# Patient Record
Sex: Female | Born: 2000 | Race: White | Hispanic: No | Marital: Single | Smoking: Never smoker
Health system: Southern US, Community
[De-identification: ages and names within clinical notes are randomized; demographics above are authoritative.]

## PROBLEM LIST (undated history)

## (undated) DIAGNOSIS — E669 Obesity, unspecified: Secondary | ICD-10-CM

## (undated) DIAGNOSIS — F913 Oppositional defiant disorder: Secondary | ICD-10-CM

## (undated) DIAGNOSIS — D649 Anemia, unspecified: Secondary | ICD-10-CM

## (undated) DIAGNOSIS — K5900 Constipation, unspecified: Secondary | ICD-10-CM

## (undated) DIAGNOSIS — J189 Pneumonia, unspecified organism: Secondary | ICD-10-CM

## (undated) DIAGNOSIS — F840 Autistic disorder: Secondary | ICD-10-CM

## (undated) DIAGNOSIS — E282 Polycystic ovarian syndrome: Secondary | ICD-10-CM

## (undated) DIAGNOSIS — E611 Iron deficiency: Secondary | ICD-10-CM

## (undated) DIAGNOSIS — K219 Gastro-esophageal reflux disease without esophagitis: Secondary | ICD-10-CM

## (undated) DIAGNOSIS — E88819 Insulin resistance, unspecified: Secondary | ICD-10-CM

## (undated) DIAGNOSIS — F819 Developmental disorder of scholastic skills, unspecified: Secondary | ICD-10-CM

## (undated) DIAGNOSIS — F32A Depression, unspecified: Secondary | ICD-10-CM

## (undated) DIAGNOSIS — F329 Major depressive disorder, single episode, unspecified: Secondary | ICD-10-CM

## (undated) DIAGNOSIS — F3281 Premenstrual dysphoric disorder: Secondary | ICD-10-CM

## (undated) DIAGNOSIS — R6250 Unspecified lack of expected normal physiological development in childhood: Secondary | ICD-10-CM

## (undated) DIAGNOSIS — F909 Attention-deficit hyperactivity disorder, unspecified type: Secondary | ICD-10-CM

## (undated) DIAGNOSIS — H547 Unspecified visual loss: Secondary | ICD-10-CM

## (undated) DIAGNOSIS — R519 Headache, unspecified: Secondary | ICD-10-CM

## (undated) DIAGNOSIS — E8881 Metabolic syndrome: Secondary | ICD-10-CM

## (undated) DIAGNOSIS — F419 Anxiety disorder, unspecified: Secondary | ICD-10-CM

## (undated) HISTORY — DX: Iron deficiency: E61.1

## (undated) HISTORY — DX: Constipation, unspecified: K59.00

## (undated) HISTORY — DX: Autistic disorder: F84.0

## (undated) HISTORY — DX: Unspecified visual loss: H54.7

## (undated) HISTORY — DX: Obesity, unspecified: E66.9

## (undated) HISTORY — DX: Gastro-esophageal reflux disease without esophagitis: K21.9

---
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
## 2021-01-01 ENCOUNTER — Encounter: Payer: Self-pay | Admitting: Registered"

## 2021-01-17 ENCOUNTER — Other Ambulatory Visit (INDEPENDENT_AMBULATORY_CARE_PROVIDER_SITE_OTHER)

## 2021-01-17 ENCOUNTER — Ambulatory Visit (INDEPENDENT_AMBULATORY_CARE_PROVIDER_SITE_OTHER): Admitting: Gastroenterology

## 2021-01-17 ENCOUNTER — Encounter: Payer: Self-pay | Admitting: Gastroenterology

## 2021-01-17 ENCOUNTER — Encounter: Payer: Self-pay | Admitting: Physician Assistant

## 2021-01-17 VITALS — BP 100/64 | HR 96 | Ht 63.78 in | Wt 291.1 lb

## 2021-01-17 DIAGNOSIS — K219 Gastro-esophageal reflux disease without esophagitis: Secondary | ICD-10-CM | POA: Diagnosis not present

## 2021-01-17 DIAGNOSIS — E611 Iron deficiency: Secondary | ICD-10-CM

## 2021-01-17 DIAGNOSIS — R1084 Generalized abdominal pain: Secondary | ICD-10-CM

## 2021-01-17 LAB — CBC
HCT: 39.5 % (ref 36.0–49.0)
Hemoglobin: 13.5 g/dL (ref 12.0–16.0)
MCHC: 34.1 g/dL (ref 31.0–37.0)
MCV: 85.3 fl (ref 78.0–98.0)
Platelets: 380 10*3/uL (ref 150.0–575.0)
RBC: 4.63 Mil/uL (ref 3.80–5.70)
RDW: 13.1 % (ref 11.4–15.5)
WBC: 8.3 10*3/uL (ref 4.5–13.5)

## 2021-01-17 LAB — FERRITIN: Ferritin: 244.2 ng/mL (ref 10.0–291.0)

## 2021-01-17 NOTE — Patient Instructions (Signed)
Your provider has requested that you go to the basement level for lab work before leaving today. Press "B" on the elevator. The lab is located at the first door on the left as you exit the elevator.  You have been scheduled for an endoscopy and colonoscopy. Please follow the written instructions given to you at your visit today. Please pick up your prep supplies at the pharmacy within the next 1-3 days. If you use inhalers (even only as needed), please bring them with you on the day of your procedure.  If you are age 30 or older, your body mass index should be between 23-30. Your Body mass index is 50.32 kg/m. If this is out of the aforementioned range listed, please consider follow up with your Primary Care Provider.  If you are age 22 or younger, your body mass index should be between 19-25. Your Body mass index is 50.32 kg/m. If this is out of the aformentioned range listed, please consider follow up with your Primary Care Provider.   ________________________________________________________  The [facility] providers would like to encourage you to use [facility] to communicate with providers for non-urgent requests or questions.  Due to long hold times on the telephone, sending your provider a message by [facility] may be a faster and more efficient way to get a response.  Please allow 48 business hours for a response.  Please remember that this is for non-urgent requests.  _______________________________________________________

## 2021-01-17 NOTE — Progress Notes (Signed)
01/17/2021 [name] [id number] [birth year]   HISTORY OF PRESENT ILLNESS: This is a 21 year old female who is new to our office.  She is here today with her mom.  She is actually been referred here by Dede Query, PA-C, from hematology/oncology for evaluation of right lower quadrant abdominal pain.  She reports abdominal pain, mostly on the right side of her abdomen.  She has had bouts at least on and off with this abdominal pain dating back to April 2019 and has had intermittent CT scans over the years that show mesenteric adenopathy/mesenteric adenitis.  They are actually saying more so right lower quadrant mesenteric adenopathy MAYBE representing mesenteric adenitis.  No specific inflammatory changes of the bowel, but question if this could be more of an IBD type picture, but sed rate and CRP are normal as well.  She had a CT scan in April 2019, March 2022, and again in November 2022 and in December 2022 that have all shown similar findings.  No diarrhea, previously had some constipation but that has improved since taking stool softeners.  No rectal bleeding.  Other basic labs also fairly unremarkable.  Was having some issues with some iron deficiency and is on ferrous sulfate daily, which is why she follows with hematology/oncology.  She does report intermittent acid reflux despite taking omeprazole 40 mg daily.  She gets a lot of nausea as well and uses Zofran as needed.  She admits to taking naproxen regularly.  She has lost about 30 pounds since September since starting [facility] for her PCOS.  Of note, the patient is a poor or at least limited historian due to her autism, developmental delay, etc.  They did report that she saw a pediatric gastroenterologist through [facility] back in 2021 and had an EGD in May 2021.  That showed normal esophagus with erythematous mucosa in the gastric body and gastric antrum.  Duodenum was normal.  Gastric biopsy showed gastric mucosa with changes  suggestive of reactive gastropathy and mild active gastritis.  Duodenal biopsies were normal.  Esophageal biopsy showed mild acute esophagitis and mild increase in intraepithelial eosinophils up to 3 in a 40 times power field suggestive of reflux esophagitis.  No Barrett's noted.   Past Medical History:  Diagnosis Date   ADHD (attention deficit hyperactivity disorder)    Anxiety    Autism spectrum    Constipated    Constipation    Depression    Development delay    GERD (gastroesophageal reflux disease)    Insulin resistance    Iron deficiency    Learning disability    Obesity    ODD (oppositional defiant disorder)    PCOS (polycystic ovarian syndrome)    PMDD (premenstrual dysphoric disorder)    Pneumonia    3 mos old and 81 mos old   Visual acuity reduced    glasses   Past Surgical History:  Procedure Laterality Date   TOOTH EXTRACTION N/A 01/03/2018   Procedure: SURGICAL EXTRACTION OF TEETH #1, 16, 17, 32;  Surgeon: Michael Litter, DMD;  Location: [facility];  Service: Oral Surgery;  Laterality: N/A;    reports that she has never smoked. She has never used smokeless tobacco. She reports that she does not drink alcohol. No history on file for drug use. family history includes ADD / ADHD in her sister; Anxiety disorder in her father, mother, and sister; Atrial fibrillation in her maternal grandmother; Breast cancer in her maternal aunt; Colon polyps in her mother; Depression  in her father; Diabetes in her father, maternal grandmother, and mother; Heart failure in her maternal grandmother; Hypertension in her maternal grandfather and mother; Iron deficiency in her maternal aunt; Irritable bowel syndrome in her mother; Obesity in her father and mother; Other in her mother and sister; Stroke in her mother. No Known Allergies    Outpatient Encounter Medications as of 01/17/2021  Medication Sig   Amphetamine Sulfate (EVEKEO) 10 MG TABS Take 10 mg by mouth as directed. Take 1-2 tablets by mouth  as directed. Take 2 tabs in AM, and 2 tabs at lunch. May take 1 tab at 4 PM PRN   blood glucose meter kit and supplies KIT Dispense based on patient and insurance preference. Use daily as directed.   busPIRone (BUSPAR) 10 MG tablet TAKE 2 TABLETS BY MOUTH THREE TIMES DAILY   cloNIDine HCl (KAPVAY) 0.1 MG TB12 ER tablet Take 2 tablets by mouth 2 times daily   ferrous sulfate 325 (65 FE) MG tablet Take 325 mg by mouth daily with breakfast.   FLUoxetine (PROZAC) 40 MG capsule TAKE 1 CAPSULE(40 MG) BY MOUTH DAILY with 10 mg capsule   MOUNJARO 7.5 MG/0.5ML Pen Inject 7.5 mg into the skin once a week.   naproxen (NAPROSYN) 500 MG tablet Take 1 tablet (500 mg total) by mouth 2 (two) times daily.   omeprazole (PRILOSEC) 40 MG capsule Take 1 capsule (40 mg total) by mouth daily.   ondansetron (ZOFRAN ODT) 4 MG disintegrating tablet Take 1 tablet (4 mg total) by mouth every 8 (eight) hours as needed for nausea or vomiting.   Topiramate ER (TROKENDI XR) 50 MG CP24 Take 1 capsule by mouth daily.   VIORELE 0.15-0.02/0.01 MG (21/5) tablet TAKE 1 TABLET BY MOUTH DAILY   vitamin C (ASCORBIC ACID) 500 MG tablet Take 500 mg by mouth daily.   [DISCONTINUED] tirzepatide [facility]) 5 MG/0.5ML Pen Inject 5 mg into the skin once a week. (Patient taking differently: Inject 7.5 mg into the skin once a week.)   [DISCONTINUED] predniSONE (DELTASONE) 10 MG tablet Take 6 tablets (60 mg total) by mouth daily with breakfast for 7 days, THEN 5 tablets (50 mg total) daily with breakfast for 7 days, THEN 4 tablets (40 mg total) daily with breakfast for 7 days, THEN 3 tablets (30 mg total) daily with breakfast for 7 days, THEN 2 tablets (20 mg total) daily with breakfast for 7 days, THEN 1 tablet (10 mg total) daily with breakfast for 7 days.   No facility-administered encounter medications on file as of 01/17/2021.     REVIEW OF SYSTEMS  : All other systems reviewed and negative except where noted in the History of Present  Illness.   PHYSICAL EXAM: BP 100/64 (BP Location: Left Arm, Patient Position: Sitting, Cuff Size: Large)    Pulse 96    Ht 5' 3.78" (1.62 m) Comment: height measured without shoes   Wt 291 lb 2 oz (132.1 kg)    LMP 09/25/2020 Comment: PCOS   BMI 50.32 kg/m  General: Well developed white female in no acute distress Head: Normocephalic and atraumatic Eyes:  Sclerae anicteric, conjunctiva pink. Ears: Normal auditory acuity Lungs: Clear throughout to auscultation; no W/R/R. Heart: Regular rate and rhythm; no M/R/G. Abdomen: Soft, obese, non-distended.  BS present.  Minimal TTP diffusely. Rectal: Will be done at the time of colonoscopy. Musculoskeletal: Symmetrical with no gross deformities  Skin: No lesions on visible extremities Extremities: No edema  Neurological: Alert oriented x 4, grossly non-focal  Psychological:  Alert and cooperative. Normal mood and affect  ASSESSMENT AND PLAN: *21 year old female with complaints of abdominal pain, mostly on the right side of her abdomen.  She has had bouts at least on and off with this abdominal pain dating back to April 2019 and has had intermittent CT scans over the years that show mesenteric adenopathy/mesenteric adenitis.  They are actually saying more so right lower quadrant mesenteric adenopathy MAYBE representing mesenteric adenitis.  No specific inflammatory changes of the bowel, but question if this could be more of an IBD type picture, but sed rate and CRP are normal as well.  No diarrhea, previously had some constipation but that has improved.  No rectal bleeding. *GERD: On omeprazole 40 mg daily, but still reports intermittent issues with this and also some nausea.  Does take NSAIDs regularly.  Rule out ulcer, etc.  -We will plan for EGD and colonoscopy at [facility] due to the patient's BMI greater than 50.  These are being scheduled with Dr. Henrene Pastor due to him having the next availability.  The risks, benefits, and alternatives to  EGD and colonoscopy were discussed with the patient and she consents to proceed.  -Will check celiac labs.  CC:  Lincoln Brigham, PA-C

## 2021-01-18 ENCOUNTER — Encounter: Payer: Self-pay | Admitting: Physician Assistant

## 2021-01-18 LAB — IRON AND TIBC
Iron Saturation: 13 % — ABNORMAL LOW (ref 15–55)
Iron: 39 ug/dL (ref 27–159)
Total Iron Binding Capacity: 292 ug/dL (ref 250–450)
UIBC: 253 ug/dL (ref 131–425)

## 2021-01-21 LAB — IGA: Immunoglobulin A: 133 mg/dL (ref 47–310)

## 2021-01-21 LAB — TISSUE TRANSGLUTAMINASE ABS,IGG,IGA
(tTG) Ab, IgA: <1 U/mL
(tTG) Ab, IgG: <1 U/mL

## 2021-01-23 ENCOUNTER — Encounter: Payer: Self-pay | Admitting: Gastroenterology

## 2021-01-28 ENCOUNTER — Encounter: Payer: Self-pay | Admitting: Family Medicine

## 2021-01-28 NOTE — Progress Notes (Signed)
Noted  

## 2021-01-29 ENCOUNTER — Other Ambulatory Visit: Payer: Self-pay | Admitting: Family

## 2021-01-30 ENCOUNTER — Encounter (INDEPENDENT_AMBULATORY_CARE_PROVIDER_SITE_OTHER): Payer: Self-pay | Admitting: Family Medicine

## 2021-01-30 ENCOUNTER — Other Ambulatory Visit: Payer: Self-pay

## 2021-01-30 ENCOUNTER — Ambulatory Visit (INDEPENDENT_AMBULATORY_CARE_PROVIDER_SITE_OTHER): Admitting: Family Medicine

## 2021-01-30 VITALS — BP 89/57 | HR 90 | Temp 98.2°F | Ht 64.0 in | Wt 286.0 lb

## 2021-01-30 DIAGNOSIS — R7301 Impaired fasting glucose: Secondary | ICD-10-CM | POA: Diagnosis not present

## 2021-01-30 DIAGNOSIS — F902 Attention-deficit hyperactivity disorder, combined type: Secondary | ICD-10-CM

## 2021-01-30 DIAGNOSIS — K219 Gastro-esophageal reflux disease without esophagitis: Secondary | ICD-10-CM | POA: Diagnosis not present

## 2021-01-30 DIAGNOSIS — G43809 Other migraine, not intractable, without status migrainosus: Secondary | ICD-10-CM

## 2021-01-30 DIAGNOSIS — Z6841 Body Mass Index (BMI) 40.0 and over, adult: Secondary | ICD-10-CM

## 2021-01-30 DIAGNOSIS — E669 Obesity, unspecified: Secondary | ICD-10-CM

## 2021-01-30 DIAGNOSIS — F419 Anxiety disorder, unspecified: Secondary | ICD-10-CM

## 2021-01-30 DIAGNOSIS — E66813 Obesity, class 3: Secondary | ICD-10-CM

## 2021-01-30 DIAGNOSIS — F32A Depression, unspecified: Secondary | ICD-10-CM

## 2021-01-30 MED ORDER — AMPHETAMINE SULFATE 10 MG PO TABS: 10.0000 mg | ORAL_TABLET | ORAL | 0 refills | Status: DC

## 2021-01-30 MED ORDER — TIRZEPATIDE 5 MG/0.5ML ~~LOC~~ SOAJ: 5.0000 mg | SUBCUTANEOUS | 3 refills | Status: DC

## 2021-01-30 MED ORDER — OMEPRAZOLE 40 MG PO CPDR: 40.0000 mg | DELAYED_RELEASE_CAPSULE | Freq: Every day | ORAL | 2 refills | Status: DC

## 2021-01-30 MED ORDER — DICYCLOMINE HCL 20 MG PO TABS: 20.0000 mg | ORAL_TABLET | Freq: Four times a day (QID) | ORAL | 0 refills | Status: DC

## 2021-01-30 MED ORDER — BUSPIRONE HCL 10 MG PO TABS: ORAL_TABLET | ORAL | 2 refills | Status: DC

## 2021-01-30 MED ORDER — TOPIRAMATE ER 50 MG PO CAP24: 1.0000 | ORAL_CAPSULE | Freq: Every day | ORAL | 1 refills | Status: DC

## 2021-01-30 MED ORDER — CLONIDINE HCL ER 0.1 MG PO TB12: ORAL_TABLET | ORAL | 0 refills | Status: DC

## 2021-01-30 NOTE — Telephone Encounter (Signed)
Patient is no longer under our care due to insurance

## 2021-02-03 ENCOUNTER — Encounter (INDEPENDENT_AMBULATORY_CARE_PROVIDER_SITE_OTHER): Payer: Self-pay | Admitting: Family Medicine

## 2021-02-03 ENCOUNTER — Encounter: Payer: Self-pay | Admitting: Family Medicine

## 2021-02-05 NOTE — Progress Notes (Signed)
Chief Complaint:   OBESITY Sarah Shah is here to discuss her progress with her obesity treatment plan along with follow-up of her obesity related diagnoses. See Medical Weight Management Flowsheet for complete bioelectrical impedance results.  Today's visit was #: 28 Starting weight: 320 lbs Starting date: 03/01/2019 Weight change since last visit: +6 lbs Total lbs lost to date: 34 lbs Total weight loss percentage to date: -10.63%  Nutrition Plan: Practicing portion control and making smarter food choices, such as increasing vegetables and decreasing simple carbohydrates for 75% of the time. Activity: Increased activity/walking. Anti-obesity medications: Mounjaro 7.5 mg subcutaneously weekly. Reported side effects: None.  Interim History: Sarah Shah says she has had increased constipation since being off metformin at the beginning of December.  LMP was last week.  She reports she is still having RUQ discomfort.  She says she broke up with her boyfriend.  Sarah Shah's PCP is unavailable. Courtesy refills today so that she does not run out of important medications.   Assessment/Plan:   1. Impaired fasting glucose, with polyphagia Controlled. Current treatment: Mounjaro 7.5 mg subcutaneously weekly.    Plan:  Decrease Mounjaro to 5 mg subcutaneously weekly.  She will continue to focus on protein-rich, low simple carbohydrate foods. We reviewed the importance of hydration, regular exercise for stress reduction, and restorative sleep.  - Decrease tirzepatide (MOUNJARO) 5 MG/0.5ML Pen; Inject 5 mg into the skin once a week.  Dispense: 6 mL; Refill: 3  2. Gastroesophageal reflux disease, unspecified whether esophagitis present She is taking Prilosec 40 mg daily.  Plan:  Continue Prilosec 40 mg daily and start Bentyl 20 mg every 6 hours.  We reviewed the diagnosis of GERD and importance of treatment. We discussed "red flag" symptoms and the importance of follow up if symptoms persisted despite  treatment. We reviewed non-pharmacologic management of GERD symptoms: including: caffeine reduction, dietary changes, elevate HOB, NPO after supper, reduction of alcohol intake, tobacco cessation, and weight loss.  - Refill omeprazole (PRILOSEC) 40 MG capsule; Take 1 capsule (40 mg total) by mouth daily.  Dispense: 90 capsule; Refill: 2 - Start dicyclomine (BENTYL) 20 MG tablet; Take 1 tablet (20 mg total) by mouth every 6 (six) hours.  Dispense: 60 tablet; Refill: 0  3. Other migraine without status migrainosus, not intractable Sarah Shah is taking topiramate 50 mg nightly for migraine prevention.  Plan:  Continue topiramate 50 mg.  Will refill today.  - Refill Topiramate ER (TROKENDI XR) 50 MG CP24; Take 1 capsule by mouth daily.  Dispense: 30 capsule; Refill: 1  4. ADHD (attention deficit hyperactivity disorder), combined type Not at goal. Medication:  clonidine 0.1 mg 2 tablets twice daily.  Counseling ADHD is the most missed diagnosis in relation to food and appetite problems. Often the strong urge to binge or to self-medicate with food subsides once the impulsivity and inattention of ADHD are treated. A person can experience a new ability to tune in to the body's signals, control cravings and improve impulse control.  A deficiency in norepinephrine and dopamine can lead to the following behaviors related to eating: Poor awareness of internal cues of hunger and satiety, or fullness. Inability to follow a meal plan. Inability to judge portion size accurately. Inability to stop bingeing or purging. Distraction by continual thoughts of food, weight and body shape. Increased desire to overeat, especially high calorie, "reward" type foods. Poor self-esteem due to repeated failures of self-control.  Plan: Start Evekeo 10 mg 2 tablets in the morning, 2 tablets at  lunch, and 1 tablet at 4 pm if needed.  Continue clonidine as prescribed.  - Start Amphetamine Sulfate (EVEKEO) 10 MG TABS; Take 10  mg by mouth as directed. Take 1-2 tablets by mouth as directed. Take 2 tabs in AM, and 2 tabs at lunch. May take 1 tab at 4 PM PRN  Dispense: 150 tablet; Refill: 0 - Refill cloNIDine HCl (KAPVAY) 0.1 MG TB12 ER tablet; Take 2 tablets by mouth 2 times daily  Dispense: 360 tablet; Refill: 0  5. Anxiety and depression Sarah Shah is taking Buspar 10 mg 2 tablets three times daily for anxiety.  Plan:  Continue Buspar as prescribed.  Will refill today, as per below.  - Refill busPIRone (BUSPAR) 10 MG tablet; TAKE 2 TABLETS BY MOUTH THREE TIMES DAILY  Dispense: 180 tablet; Refill: 2  6. Obesity, current BMI 49.1  Course: Sarah Shah is currently in the action stage of change. As such, her goal is to continue with weight loss efforts.   Nutrition goals: She has agreed to practicing portion control and making smarter food choices, such as increasing vegetables and decreasing simple carbohydrates.   Exercise goals:  As is.  Behavioral modification strategies: increasing lean protein intake, decreasing simple carbohydrates, increasing vegetables, and increasing water intake.  Sarah Shah has agreed to follow-up with our clinic in 4 weeks. She was informed of the importance of frequent follow-up visits to maximize her success with intensive lifestyle modifications for her multiple health conditions.   Objective:   Blood pressure (!) 89/57, pulse 90, temperature 98.2 F (36.8 C), temperature source Oral, height 5\' 4"  (1.626 m), weight 286 lb (129.7 kg), SpO2 98 %. Body mass index is 49.09 kg/m.  General: Cooperative, alert, well developed, in no acute distress. HEENT: Conjunctivae and lids unremarkable. Cardiovascular: Regular rhythm.  Lungs: Normal work of breathing. Neurologic: No focal deficits.   Lab Results  Component Value Date   CREATININE 0.52 12/29/2020   BUN 7 12/29/2020   NA 138 12/29/2020   K 3.2 (L) 12/29/2020   CL 104 12/29/2020   CO2 26 12/29/2020   Lab Results  Component Value Date    ALT 16 12/29/2020   AST 9 (L) 12/29/2020   ALKPHOS 63 12/29/2020   BILITOT 0.2 (L) 12/29/2020   Lab Results  Component Value Date   HGBA1C 4.7 11/28/2020   HGBA1C 5.1 07/25/2020   HGBA1C 5.5 03/25/2020   HGBA1C 5.2 03/12/2020   HGBA1C 5.4 12/12/2019   Lab Results  Component Value Date   INSULIN 86.6 (H) 06/13/2020   INSULIN 42.6 (H) 03/25/2020   INSULIN 55.0 (H) 07/18/2019   INSULIN 35.1 (H) 03/01/2019   Lab Results  Component Value Date   TSH 2.470 03/01/2019   Lab Results  Component Value Date   CHOL 164 07/18/2019   HDL 52 07/18/2019   LDLCALC 86 07/18/2019   TRIG 150 (H) 07/18/2019   Lab Results  Component Value Date   VD25OH 46.3 03/25/2020   VD25OH 39.9 07/18/2019   VD25OH 22.0 (L) 03/01/2019   Lab Results  Component Value Date   WBC 8.3 01/17/2021   HGB 13.5 01/17/2021   HCT 39.5 01/17/2021   MCV 85.3 01/17/2021   PLT 380.0 01/17/2021   Lab Results  Component Value Date   IRON 39 01/17/2021   TIBC 292 01/17/2021   FERRITIN 244.2 01/17/2021   Attestation Statements:   Reviewed by clinician on day of visit: allergies, medications, problem list, medical history, surgical history, family history, social history, and  previous encounter notes.  I, Insurance claims handler, CMA, am acting as Energy manager for William Hamburger, NP.  I have reviewed the above documentation for accuracy and completeness, and I agree with the above. -  Helane Rima, DO, MS, FAAFP, DABOM - Family and Bariatric Medicine.

## 2021-02-06 ENCOUNTER — Encounter (HOSPITAL_COMMUNITY): Payer: Self-pay | Admitting: Internal Medicine

## 2021-02-17 ENCOUNTER — Ambulatory Visit (HOSPITAL_COMMUNITY)
Admission: RE | Admit: 2021-02-17 | Discharge: 2021-02-17 | Disposition: A | Discharge status: Home / Self Care | Attending: Internal Medicine | Admitting: Internal Medicine

## 2021-02-17 ENCOUNTER — Ambulatory Visit (HOSPITAL_COMMUNITY): Admitting: Anesthesiology

## 2021-02-17 ENCOUNTER — Encounter (HOSPITAL_COMMUNITY): Payer: Self-pay | Admitting: Internal Medicine

## 2021-02-17 ENCOUNTER — Encounter (HOSPITAL_COMMUNITY)
Admission: RE | Disposition: A | Discharge status: Home / Self Care | Payer: Self-pay | Source: Home / Self Care | Attending: Internal Medicine

## 2021-02-17 ENCOUNTER — Other Ambulatory Visit: Payer: Self-pay

## 2021-02-17 DIAGNOSIS — R1031 Right lower quadrant pain: Secondary | ICD-10-CM | POA: Diagnosis not present

## 2021-02-17 DIAGNOSIS — F419 Anxiety disorder, unspecified: Secondary | ICD-10-CM | POA: Insufficient documentation

## 2021-02-17 DIAGNOSIS — R110 Nausea: Secondary | ICD-10-CM | POA: Diagnosis not present

## 2021-02-17 DIAGNOSIS — D509 Iron deficiency anemia, unspecified: Secondary | ICD-10-CM

## 2021-02-17 DIAGNOSIS — R1084 Generalized abdominal pain: Secondary | ICD-10-CM

## 2021-02-17 DIAGNOSIS — E611 Iron deficiency: Secondary | ICD-10-CM | POA: Insufficient documentation

## 2021-02-17 DIAGNOSIS — Z79899 Other long term (current) drug therapy: Secondary | ICD-10-CM | POA: Insufficient documentation

## 2021-02-17 DIAGNOSIS — R933 Abnormal findings on diagnostic imaging of other parts of digestive tract: Secondary | ICD-10-CM | POA: Insufficient documentation

## 2021-02-17 DIAGNOSIS — F840 Autistic disorder: Secondary | ICD-10-CM | POA: Diagnosis not present

## 2021-02-17 DIAGNOSIS — R935 Abnormal findings on diagnostic imaging of other abdominal regions, including retroperitoneum: Secondary | ICD-10-CM

## 2021-02-17 DIAGNOSIS — K219 Gastro-esophageal reflux disease without esophagitis: Secondary | ICD-10-CM | POA: Diagnosis not present

## 2021-02-17 DIAGNOSIS — R109 Unspecified abdominal pain: Secondary | ICD-10-CM | POA: Diagnosis not present

## 2021-02-17 DIAGNOSIS — E282 Polycystic ovarian syndrome: Secondary | ICD-10-CM | POA: Insufficient documentation

## 2021-02-17 HISTORY — PX: BIOPSY: SHX5522

## 2021-02-17 HISTORY — PX: COLONOSCOPY WITH PROPOFOL: SHX5780

## 2021-02-17 HISTORY — PX: ESOPHAGOGASTRODUODENOSCOPY (EGD) WITH PROPOFOL: SHX5813

## 2021-02-17 SURGERY — COLONOSCOPY WITH PROPOFOL
Anesthesia: Monitor Anesthesia Care

## 2021-02-17 MED ORDER — PROPOFOL 10 MG/ML IV BOLUS
INTRAVENOUS | Status: AC
  Filled 2021-02-17: qty 20

## 2021-02-17 MED ORDER — PROPOFOL 500 MG/50ML IV EMUL
INTRAVENOUS | Status: DC | PRN
  Administered 2021-02-17: 125 ug/kg/min via INTRAVENOUS

## 2021-02-17 MED ORDER — PROPOFOL 500 MG/50ML IV EMUL
INTRAVENOUS | Status: AC
  Filled 2021-02-17: qty 50

## 2021-02-17 MED ORDER — PROPOFOL 10 MG/ML IV BOLUS
INTRAVENOUS | Status: DC | PRN
Start: 2021-02-17 — End: 2021-02-17
  Administered 2021-02-17 (×6): 20 mg via INTRAVENOUS

## 2021-02-17 MED ORDER — LACTATED RINGERS IV SOLN: INTRAVENOUS | Status: DC

## 2021-02-17 MED ORDER — SODIUM CHLORIDE 0.9 % IV SOLN: INTRAVENOUS | Status: DC

## 2021-02-17 MED ORDER — DEXMEDETOMIDINE (PRECEDEX) IN NS 20 MCG/5ML (4 MCG/ML) IV SYRINGE
PREFILLED_SYRINGE | INTRAVENOUS | Status: DC | PRN
  Administered 2021-02-17 (×2): 8 ug via INTRAVENOUS

## 2021-02-17 MED ORDER — LIDOCAINE 2% (20 MG/ML) 5 ML SYRINGE
INTRAMUSCULAR | Status: DC | PRN
  Administered 2021-02-17: 100 mg via INTRAVENOUS

## 2021-02-17 SURGICAL SUPPLY — 25 items

## 2021-02-17 NOTE — Transfer of Care (Signed)
Immediate Anesthesia Transfer of Care Note  Patient: Polina Burmaster  Procedure(s) Performed: COLONOSCOPY WITH PROPOFOL ESOPHAGOGASTRODUODENOSCOPY (EGD) WITH PROPOFOL BIOPSY  Patient Location: Endoscopy Unit  Anesthesia Type:MAC  Level of Consciousness: drowsy  Airway & Oxygen Therapy: Patient Spontanous Breathing and Patient connected to face mask oxygen  Post-op Assessment: Report given to RN and Post -op Vital signs reviewed and stable  Post vital signs: Reviewed and stable  Last Vitals:  Vitals Value Taken Time  BP 103/51 02/17/21 1055  Temp    Pulse 71 02/17/21 1057  Resp 27 02/17/21 1057  SpO2 100 % 02/17/21 1057  Vitals shown include unvalidated device data.  Last Pain:  Vitals:   02/17/21 0935  TempSrc: Oral  PainSc: 0-No pain         Complications: No notable events documented.

## 2021-02-17 NOTE — H&P (Signed)
01/17/2021 [name] [id number] Jul 13, 2000     HISTORY OF PRESENT ILLNESS: This is a 21 year old female who is new to our office.  She is here today with her mom.  She is actually been referred here by Dede Query, PA-C, from hematology/oncology for evaluation of right lower quadrant abdominal pain.  She reports abdominal pain, mostly on the right side of her abdomen.  She has had bouts at least on and off with this abdominal pain dating back to April 2019 and has had intermittent CT scans over the years that show mesenteric adenopathy/mesenteric adenitis.  They are actually saying more so right lower quadrant mesenteric adenopathy MAYBE representing mesenteric adenitis.  No specific inflammatory changes of the bowel, but question if this could be more of an IBD type picture, but sed rate and CRP are normal as well.  She had a CT scan in April 2019, March 2022, and again in November 2022 and in December 2022 that have all shown similar findings.  No diarrhea, previously had some constipation but that has improved since taking stool softeners.  No rectal bleeding.  Other basic labs also fairly unremarkable.  Was having some issues with some iron deficiency and is on ferrous sulfate daily, which is why she follows with hematology/oncology.   She does report intermittent acid reflux despite taking omeprazole 40 mg daily.  She gets a lot of nausea as well and uses Zofran as needed.  She admits to taking naproxen regularly.   She has lost about 30 pounds since September since starting [facility] for her PCOS.   Of note, the patient is a poor or at least limited historian due to her autism, developmental delay, etc.   They did report that she saw a pediatric gastroenterologist through [facility] back in 2021 and had an EGD in May 2021.  That showed normal esophagus with erythematous mucosa in the gastric body and gastric antrum.  Duodenum was normal.  Gastric biopsy showed gastric mucosa with changes suggestive  of reactive gastropathy and mild active gastritis.  Duodenal biopsies were normal.  Esophageal biopsy showed mild acute esophagitis and mild increase in intraepithelial eosinophils up to 3 in a 40 times power field suggestive of reflux esophagitis.  No Barrett's noted.         Past Medical History:  Diagnosis Date   ADHD (attention deficit hyperactivity disorder)     Anxiety     Autism spectrum     Constipated     Constipation     Depression     Development delay     GERD (gastroesophageal reflux disease)     Insulin resistance     Iron deficiency     Learning disability     Obesity     ODD (oppositional defiant disorder)     PCOS (polycystic ovarian syndrome)     PMDD (premenstrual dysphoric disorder)     Pneumonia      3 mos old and 52 mos old   Visual acuity reduced      glasses         Past Surgical History:  Procedure Laterality Date   TOOTH EXTRACTION N/A 01/03/2018    Procedure: SURGICAL EXTRACTION OF TEETH #1, 16, 17, 32;  Surgeon: Michael Litter, DMD;  Location: [facility];  Service: Oral Surgery;  Laterality: N/A;     reports that she has never smoked. She has never used smokeless tobacco. She reports that she does not drink alcohol. No history on file for drug  use. family history includes ADD / ADHD in her sister; Anxiety disorder in her father, mother, and sister; Atrial fibrillation in her maternal grandmother; Breast cancer in her maternal aunt; Colon polyps in her mother; Depression in her father; Diabetes in her father, maternal grandmother, and mother; Heart failure in her maternal grandmother; Hypertension in her maternal grandfather and mother; Iron deficiency in her maternal aunt; Irritable bowel syndrome in her mother; Obesity in her father and mother; Other in her mother and sister; Stroke in her mother. No Known Allergies         Outpatient Encounter Medications as of 01/17/2021  Medication Sig   Amphetamine Sulfate (EVEKEO) 10 MG TABS Take 10 mg by mouth as  directed. Take 1-2 tablets by mouth as directed. Take 2 tabs in AM, and 2 tabs at lunch. May take 1 tab at 4 PM PRN   blood glucose meter kit and supplies KIT Dispense based on patient and insurance preference. Use daily as directed.   busPIRone (BUSPAR) 10 MG tablet TAKE 2 TABLETS BY MOUTH THREE TIMES DAILY   cloNIDine HCl (KAPVAY) 0.1 MG TB12 ER tablet Take 2 tablets by mouth 2 times daily   ferrous sulfate 325 (65 FE) MG tablet Take 325 mg by mouth daily with breakfast.   FLUoxetine (PROZAC) 40 MG capsule TAKE 1 CAPSULE(40 MG) BY MOUTH DAILY with 10 mg capsule   MOUNJARO 7.5 MG/0.5ML Pen Inject 7.5 mg into the skin once a week.   naproxen (NAPROSYN) 500 MG tablet Take 1 tablet (500 mg total) by mouth 2 (two) times daily.   omeprazole (PRILOSEC) 40 MG capsule Take 1 capsule (40 mg total) by mouth daily.   ondansetron (ZOFRAN ODT) 4 MG disintegrating tablet Take 1 tablet (4 mg total) by mouth every 8 (eight) hours as needed for nausea or vomiting.   Topiramate ER (TROKENDI XR) 50 MG CP24 Take 1 capsule by mouth daily.   VIORELE 0.15-0.02/0.01 MG (21/5) tablet TAKE 1 TABLET BY MOUTH DAILY   vitamin C (ASCORBIC ACID) 500 MG tablet Take 500 mg by mouth daily.   [DISCONTINUED] tirzepatide [facility]) 5 MG/0.5ML Pen Inject 5 mg into the skin once a week. (Patient taking differently: Inject 7.5 mg into the skin once a week.)   [DISCONTINUED] predniSONE (DELTASONE) 10 MG tablet Take 6 tablets (60 mg total) by mouth daily with breakfast for 7 days, THEN 5 tablets (50 mg total) daily with breakfast for 7 days, THEN 4 tablets (40 mg total) daily with breakfast for 7 days, THEN 3 tablets (30 mg total) daily with breakfast for 7 days, THEN 2 tablets (20 mg total) daily with breakfast for 7 days, THEN 1 tablet (10 mg total) daily with breakfast for 7 days.    No facility-administered encounter medications on file as of 01/17/2021.        REVIEW OF SYSTEMS  : All other systems reviewed and negative except where  noted in the History of Present Illness.     PHYSICAL EXAM: BP 100/64 (BP Location: Left Arm, Patient Position: Sitting, Cuff Size: Large)    Pulse 96    Ht 5' 3.78" (1.62 m) Comment: height measured without shoes   Wt 291 lb 2 oz (132.1 kg)    LMP 09/25/2020 Comment: PCOS   BMI 50.32 kg/m  General: Well developed white female in no acute distress Head: Normocephalic and atraumatic Eyes:  Sclerae anicteric, conjunctiva pink. Ears: Normal auditory acuity Lungs: Clear throughout to auscultation; no W/R/R. Heart: Regular rate and  rhythm; no M/R/G. Abdomen: Soft, obese, non-distended.  BS present.  Minimal TTP diffusely. Rectal: Will be done at the time of colonoscopy. Musculoskeletal: Symmetrical with no gross deformities  Skin: No lesions on visible extremities Extremities: No edema  Neurological: Alert oriented x 4, grossly non-focal Psychological:  Alert and cooperative. Normal mood and affect   ASSESSMENT AND PLAN: *20 year old female with complaints of abdominal pain, mostly on the right side of her abdomen.  She has had bouts at least on and off with this abdominal pain dating back to April 2019 and has had intermittent CT scans over the years that show mesenteric adenopathy/mesenteric adenitis.  They are actually saying more so right lower quadrant mesenteric adenopathy MAYBE representing mesenteric adenitis.  No specific inflammatory changes of the bowel, but question if this could be more of an IBD type picture, but sed rate and CRP are normal as well.  No diarrhea, previously had some constipation but that has improved.  No rectal bleeding. *GERD: On omeprazole 40 mg daily, but still reports intermittent issues with this and also some nausea.  Does take NSAIDs regularly.  Rule out ulcer, etc.   -We will plan for EGD and colonoscopy at [facility] due to the patient's BMI greater than 50.  These are being scheduled with Dr. Henrene Pastor due to him having the next availability.  The  risks, benefits, and alternatives to EGD and colonoscopy were discussed with the patient and she consents to proceed.  -Will check celiac labs.   CC:  Cordelia Poche   The patient presents today for colonoscopy and upper endoscopy to evaluate chronic right-sided pain.  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  Complete GI H&P as above.  No interval changes.  Blood work showed normal hemoglobin 13.5.  Normal ferritin.  Celiac testing negative.  She tolerated her prep.  She is here with mom.  No significant pain.  Exam unremarkable except for obesity.  Docia Chuck. Geri Seminole., M.D. [facility] Division of Gastroenterology

## 2021-02-17 NOTE — Op Note (Signed)
[facility] Patient Name: Sarah Shah Procedure Date: 02/17/2021 MRN: [id number] Attending MD: Wilhemina Bonito. Marina Goodell , MD Date of Birth: [birth year] CSN: [id number] Age: 21 Admit Type: Outpatient Procedure:                Upper GI endoscopy with biopsies Indications:              Abdominal pain Providers:                Wilhemina Bonito. Marina Goodell, MD, Fransisca Connors, Wanita Chamberlain,                            Technician Referring MD:              Medicines:                Monitored Anesthesia Care Complications:            No immediate complications. Estimated Blood Loss:     Estimated blood loss: none. Procedure:                Pre-Anesthesia Assessment:                           - Prior to the procedure, a History and Physical                            was performed, and patient medications and                            allergies were reviewed. The patient's tolerance of                            previous anesthesia was also reviewed. The risks                            and benefits of the procedure and the sedation                            options and risks were discussed with the patient.                            All questions were answered, and informed consent                            was obtained. Prior Anticoagulants: The patient has                            taken no previous anticoagulant or antiplatelet                            agents. ASA Grade Assessment: II - A patient with                            mild systemic disease. After reviewing the risks  and benefits, the patient was deemed in                            satisfactory condition to undergo the procedure.                           After obtaining informed consent, the endoscope was                            passed under direct vision. Throughout the                            procedure, the patient's blood pressure, pulse, and                            oxygen saturations were  monitored continuously. The                            [id number]-[id number] ([id number]) Olympus endoscope was introduced                            through the mouth, and advanced to the second part                            of duodenum. The upper GI endoscopy was                            accomplished without difficulty. The patient                            tolerated the procedure well. Scope In: Scope Out: Findings:      The esophagus was normal.      The stomach was normal.      The examined duodenum was normal. Biopsies for histology were taken with       a cold forceps for evaluation of celiac disease.      The cardia and gastric fundus were normal on retroflexion. Impression:               1. Normal EGD                           2. No cause for pain found on either colonoscopy or                            upper endoscopy. Moderate Sedation:      none Recommendation:           - Patient has a contact number available for                            emergencies. The signs and symptoms of potential                            delayed complications were discussed with the  patient. Return to normal activities tomorrow.                            Written discharge instructions were provided to the                            patient.                           - Resume previous diet.                           - Continue present medications.                           - Await pathology results.                           - Return to the care of Georga Kaufmann, PA-C Procedure Code(s):        --- Professional ---                           [phone], Esophagogastroduodenoscopy, flexible,                            transoral; with biopsy, single or multiple Diagnosis Code(s):        --- Professional ---                           R10.9, Unspecified abdominal pain CPT copyright 2019 American Medical Association. All rights reserved. The codes documented in this report are  preliminary and upon coder review may  be revised to meet current compliance requirements. Wilhemina Bonito. Marina Goodell, MD 02/17/2021 10:57:29 AM This report has been signed electronically. Number of Addenda: 0

## 2021-02-17 NOTE — Anesthesia Preprocedure Evaluation (Signed)
Anesthesia Evaluation  Patient identified by MRN, date of birth, ID band Patient awake    Reviewed: Allergy & Precautions, NPO status , Patient's Chart, lab work & pertinent test results  Airway Mallampati: II  TM Distance: >3 FB     Dental   Pulmonary pneumonia,    breath sounds clear to auscultation       Cardiovascular negative cardio ROS   Rhythm:Regular Rate:Normal     Neuro/Psych PSYCHIATRIC DISORDERS    GI/Hepatic Neg liver ROS, GERD  ,  Endo/Other  negative endocrine ROS  Renal/GU negative Renal ROS     Musculoskeletal   Abdominal   Peds  Hematology   Anesthesia Other Findings   Reproductive/Obstetrics                             Anesthesia Physical Anesthesia Plan  ASA: 3  Anesthesia Plan: MAC   Post-op Pain Management:    Induction: Intravenous  PONV Risk Score and Plan: 2 and Propofol infusion and Treatment may vary due to age or medical condition  Airway Management Planned: Simple Face Mask  Additional Equipment:   Intra-op Plan:   Post-operative Plan:   Informed Consent: I have reviewed the patients History and Physical, chart, labs and discussed the procedure including the risks, benefits and alternatives for the proposed anesthesia with the patient or authorized representative who has indicated his/her understanding and acceptance.     Dental advisory given and Interpreter used for interveiw  Plan Discussed with: CRNA and Anesthesiologist  Anesthesia Plan Comments:         Anesthesia Quick Evaluation

## 2021-02-17 NOTE — Op Note (Signed)
[facility] Patient Name: Sarah Shah Procedure Date: 02/17/2021 MRN: [id number] Attending MD: Wilhemina BonitoJohn N. Marina GoodellPerry , MD Date of Birth: 2000/11/24 CSN: [id number] Age: 21 Admit Type: Outpatient Procedure:                Colonoscopy Indications:              Abdominal pain in the right lower quadrant,                            Abnormal CT of the GI tract Providers:                Wilhemina BonitoJohn N. Marina GoodellPerry, MD, Fransisca ConnorsMichael Williams, Wanita ChamberlainJalen Miles,                            Technician Referring MD:             Georga KaufmannIrene Thayil, [facility] Medicines:                Monitored Anesthesia Care Complications:            No immediate complications. Estimated blood loss:                            None. Estimated Blood Loss:     Estimated blood loss: none. Procedure:                Pre-Anesthesia Assessment:                           - Prior to the procedure, a History and Physical                            was performed, and patient medications and                            allergies were reviewed. The patient's tolerance of                            previous anesthesia was also reviewed. The risks                            and benefits of the procedure and the sedation                            options and risks were discussed with the patient.                            All questions were answered, and informed consent                            was obtained. Prior Anticoagulants: The patient has                            taken no previous anticoagulant or antiplatelet                            agents.  ASA Grade Assessment: II - A patient with                            mild systemic disease. After reviewing the risks                            and benefits, the patient was deemed in                            satisfactory condition to undergo the procedure.                           After obtaining informed consent, the colonoscope                            was passed under direct vision.  Throughout the                            procedure, the patient's blood pressure, pulse, and                            oxygen saturations were monitored continuously. The                            CF-HQ190L ([id number] Olympus colonoscope was                            introduced through the anus and advanced to the the                            cecum, identified by appendiceal orifice and                            ileocecal valve. The ileocecal valve, appendiceal                            orifice, and rectum were photographed. The quality                            of the bowel preparation was excellent. The                            colonoscopy was performed without difficulty. The                            patient tolerated the procedure well. The bowel                            preparation used was SUPREP via split dose                            instruction. Scope In: 10:23:36 AM Scope Out: 10:35:45 AM Scope Withdrawal Time: 0 hours 7 minutes 8 seconds  Total Procedure Duration: 0 hours 12 minutes 9  seconds  Findings:      The entire examined colon appeared normal on direct and retroflexion       views. Impression:               - The entire examined colon is normal on direct and                            retroflexion views.                           - No specimens collected. Moderate Sedation:      none Recommendation:           - Repeat colonoscopy at age 62 for screening                            purposes.                           - Patient has a contact number available for                            emergencies. The signs and symptoms of potential                            delayed complications were discussed with the                            patient. Return to normal activities tomorrow.                            Written discharge instructions were provided to the                            patient.                           - Resume previous diet.                            - Continue present medications. Procedure Code(s):        --- Professional ---                           [phone], Colonoscopy, flexible; diagnostic, including                            collection of specimen(s) by brushing or washing,                            when performed (separate procedure) Diagnosis Code(s):        --- Professional ---                           R10.31, Right lower quadrant pain                           R93.3, Abnormal  findings on diagnostic imaging of                            other parts of digestive tract CPT copyright 2019 American Medical Association. All rights reserved. The codes documented in this report are preliminary and upon coder review may  be revised to meet current compliance requirements. Wilhemina Bonito. Marina Goodell, MD 02/17/2021 10:53:22 AM This report has been signed electronically. Number of Addenda: 0

## 2021-02-17 NOTE — Discharge Instructions (Signed)

## 2021-02-18 LAB — SURGICAL PATHOLOGY

## 2021-02-18 NOTE — Anesthesia Postprocedure Evaluation (Signed)
Anesthesia Post Note  Patient: Sarah Shah  Procedure(s) Performed: COLONOSCOPY WITH PROPOFOL ESOPHAGOGASTRODUODENOSCOPY (EGD) WITH PROPOFOL BIOPSY     Patient location during evaluation: PACU Anesthesia Type: MAC Level of consciousness: awake Pain management: pain level controlled Vital Signs Assessment: post-procedure vital signs reviewed and stable Respiratory status: spontaneous breathing Cardiovascular status: stable Postop Assessment: no apparent nausea or vomiting Anesthetic complications: no   No notable events documented.  Last Vitals:  Vitals:   02/17/21 1103 02/17/21 1111  BP: (!) 99/59 110/81  Pulse: 74 69  Resp: 19 (!) 29  Temp:    SpO2: 100% 96%    Last Pain:  Vitals:   02/17/21 1111  TempSrc:   PainSc: 0-No pain                 Mahdi Frye

## 2021-02-18 NOTE — Anesthesia Postprocedure Evaluation (Signed)
Anesthesia Post Note  Patient: Sarah Shah  Procedure(s) Performed: COLONOSCOPY WITH PROPOFOL ESOPHAGOGASTRODUODENOSCOPY (EGD) WITH PROPOFOL BIOPSY     Anesthesia Post Evaluation No notable events documented.  Last Vitals:  Vitals:   02/17/21 1103 02/17/21 1111  BP: (!) 99/59 110/81  Pulse: 74 69  Resp: 19 (!) 29  Temp:    SpO2: 100% 96%    Last Pain:  Vitals:   02/17/21 1111  TempSrc:   PainSc: 0-No pain                 Amiah Frohlich

## 2021-02-19 ENCOUNTER — Ambulatory Visit: Admitting: Family Medicine

## 2021-02-25 ENCOUNTER — Encounter: Payer: Self-pay | Admitting: Physician Assistant

## 2021-02-25 ENCOUNTER — Ambulatory Visit: Admitting: Family Medicine

## 2021-03-03 ENCOUNTER — Ambulatory Visit (INDEPENDENT_AMBULATORY_CARE_PROVIDER_SITE_OTHER): Admitting: Family Medicine

## 2021-03-03 ENCOUNTER — Other Ambulatory Visit: Payer: Self-pay

## 2021-03-03 VITALS — BP 90/58 | HR 88 | Temp 98.3°F | Ht 64.0 in | Wt 287.2 lb

## 2021-03-03 DIAGNOSIS — F419 Anxiety disorder, unspecified: Secondary | ICD-10-CM | POA: Diagnosis not present

## 2021-03-03 DIAGNOSIS — F418 Other specified anxiety disorders: Secondary | ICD-10-CM

## 2021-03-03 DIAGNOSIS — F32A Depression, unspecified: Secondary | ICD-10-CM

## 2021-03-03 DIAGNOSIS — F913 Oppositional defiant disorder: Secondary | ICD-10-CM

## 2021-03-03 DIAGNOSIS — F840 Autistic disorder: Secondary | ICD-10-CM | POA: Diagnosis not present

## 2021-03-03 DIAGNOSIS — F3341 Major depressive disorder, recurrent, in partial remission: Secondary | ICD-10-CM | POA: Diagnosis not present

## 2021-03-03 DIAGNOSIS — F902 Attention-deficit hyperactivity disorder, combined type: Secondary | ICD-10-CM

## 2021-03-03 MED ORDER — AMPHETAMINE SULFATE 10 MG PO TABS: ORAL_TABLET | ORAL | 0 refills | Status: DC

## 2021-03-03 MED ORDER — BUSPIRONE HCL 10 MG PO TABS: ORAL_TABLET | ORAL | 1 refills | Status: DC

## 2021-03-03 MED ORDER — FLUOXETINE HCL 40 MG PO CAPS: 40.0000 mg | ORAL_CAPSULE | Freq: Every day | ORAL | 3 refills | Status: DC

## 2021-03-03 MED ORDER — CLONIDINE HCL ER 0.1 MG PO TB12: 0.2000 mg | ORAL_TABLET | Freq: Two times a day (BID) | ORAL | 1 refills | Status: DC

## 2021-03-03 NOTE — Assessment & Plan Note (Addendum)
Stable continue BuSpar 20 mg twice a day additional 10 to 20 mg as needed later in the day.  Continue Prozac 40 mg.  Some improvement also with emotional support dog. Referrals placed today for new care.

## 2021-03-03 NOTE — Progress Notes (Signed)
Subjective:     Sarah Shah is a 21 y.o. female presenting for Medication Refill     Medication Refill   #ADHD - taking evekeo 10 mg daily - was on ritalin - but this made her aggressive  - was on vyvanse for years - but was gaining weight  #ODD - mom concerned about chronically tired - but emilty still with some outbursts - lost her psych support  - with covid she stopped therapy - has tried to reconnect with [facility] - has a list of people who are telehealth - endorses some orthostatic symptoms when working - drinks lots of water - she will leave the room or separate when she gets emotional symptoms   Review of Systems   Social History   Tobacco Use  Smoking Status Never  Smokeless Tobacco Never        Objective:    BP Readings from Last 3 Encounters:  03/03/21 (!) 90/58  02/17/21 110/81  01/30/21 (!) 89/57   Wt Readings from Last 3 Encounters:  03/03/21 287 lb 3 oz (130.3 kg) (>99 %, Z= 2.75)*  02/17/21 275 lb (124.7 kg) (>99 %, Z= 2.68)*  01/30/21 286 lb (129.7 kg) (>99 %, Z= 2.74)*   * Growth percentiles are based on CDC (Girls, 2-20 Years) data.    BP (!) 90/58    Pulse 88    Temp 98.3 F (36.8 C) (Oral)    Ht 5\' 4"  (1.626 m)    Wt 287 lb 3 oz (130.3 kg)    SpO2 98%    BMI 49.30 kg/m    Physical Exam Constitutional:      General: She is not in acute distress.    Appearance: She is well-developed. She is obese. She is not diaphoretic.  HENT:     Right Ear: External ear normal.     Left Ear: External ear normal.     Nose: Nose normal.  Eyes:     Conjunctiva/sclera: Conjunctivae normal.  Cardiovascular:     Rate and Rhythm: Normal rate.  Pulmonary:     Effort: Pulmonary effort is normal.  Musculoskeletal:     Cervical back: Neck supple.  Skin:    General: Skin is warm and dry.     Capillary Refill: Capillary refill takes less than 2 seconds.  Neurological:     Mental Status: She is alert. Mental status is at baseline.   Psychiatric:        Mood and Affect: Mood normal.        Behavior: Behavior normal.          Assessment & Plan:   Problem List Items Addressed This Visit       Other   Attention deficit hyperactivity disorder (ADHD), combined type - Primary    She has been stable on her current dose of Evekeo.  She takes 20 mg twice a day and an additional 10 mg in the late afternoon.  New referral was placed for ongoing care.  Discussed okay to continue care here until we are able to get her into psych.      Relevant Medications   Amphetamine Sulfate (EVEKEO) 10 MG TABS (Start on 03/31/2021)   Amphetamine Sulfate (EVEKEO) 10 MG TABS (Start on 05/01/2021)   Other Relevant Orders   Ambulatory referral to Psychiatry   Ambulatory referral to Psychology   Oppositional defiant disorder    Stable.  Needs new psychiatrist due to insurance change.  Initially discussed she felt her  outburst were not greatly controlled, however, later she mentioned that she steps out of the room or tries to take a deep breath is using several coping strategy.  At this point we will continue clonidine though due to low blood pressure discussed trial of reduced dose from point to 0.2>0.1      Relevant Orders   Ambulatory referral to Psychiatry   Ambulatory referral to Psychology   Autism spectrum disorder    Stable.  She graduates this year and needs new psych and psychology support for ongoing therapy due to insurance change.      Relevant Orders   Ambulatory referral to Psychiatry   Ambulatory referral to Psychology   Other specified anxiety disorders    Stable continue BuSpar 20 mg twice a day additional 10 to 20 mg as needed later in the day.  Continue Prozac 40 mg.  Some improvement also with emotional support dog. Referrals placed today for new care.      Relevant Medications   FLUoxetine (PROZAC) 40 MG capsule   busPIRone (BUSPAR) 10 MG tablet   Depression   Relevant Medications   FLUoxetine (PROZAC) 40 MG  capsule   busPIRone (BUSPAR) 10 MG tablet   Other Visit Diagnoses     ADHD (attention deficit hyperactivity disorder), combined type       Relevant Medications   cloNIDine HCl (KAPVAY) 0.1 MG TB12 ER tablet   Amphetamine Sulfate (EVEKEO) 10 MG TABS   Anxiety and depression       Relevant Medications   FLUoxetine (PROZAC) 40 MG capsule   busPIRone (BUSPAR) 10 MG tablet        Return in about 3 months (around 05/31/2021) for ADHD medication refill .  Lynnda Child, MD  This visit occurred during the SARS-CoV-2 public health emergency.  Safety protocols were in place, including screening questions prior to the visit, additional usage of staff PPE, and extensive cleaning of exam room while observing appropriate contact time as indicated for disinfecting solutions.

## 2021-03-03 NOTE — Patient Instructions (Addendum)
Clonidine - can try taking 1 pill twice daily or even go down to 1 pill morning and 2 evening  Watch for  - worsening ADHD - worsening outburst - worsening headaches  If no change in the above then continue lower dose  Compression socks - helps with blood flow

## 2021-03-03 NOTE — Assessment & Plan Note (Signed)
Stable.  She graduates this year and needs new psych and psychology support for ongoing therapy due to insurance change.

## 2021-03-03 NOTE — Assessment & Plan Note (Signed)
Stable.  Needs new psychiatrist due to insurance change.  Initially discussed she felt her outburst were not greatly controlled, however, later she mentioned that she steps out of the room or tries to take a deep breath is using several coping strategy.  At this point we will continue clonidine though due to low blood pressure discussed trial of reduced dose from point to 0.2>0.1

## 2021-03-03 NOTE — Assessment & Plan Note (Signed)
She has been stable on her current dose of Evekeo.  She takes 20 mg twice a day and an additional 10 mg in the late afternoon.  New referral was placed for ongoing care.  Discussed okay to continue care here until we are able to get her into psych.

## 2021-03-07 ENCOUNTER — Encounter: Payer: Self-pay | Admitting: Physician Assistant

## 2021-03-12 ENCOUNTER — Ambulatory Visit: Admitting: Registered"

## 2021-03-13 ENCOUNTER — Ambulatory Visit (INDEPENDENT_AMBULATORY_CARE_PROVIDER_SITE_OTHER): Admitting: Family Medicine

## 2021-03-17 ENCOUNTER — Ambulatory Visit (INDEPENDENT_AMBULATORY_CARE_PROVIDER_SITE_OTHER): Admitting: Family Medicine

## 2021-03-17 ENCOUNTER — Encounter: Payer: Self-pay | Admitting: Physician Assistant

## 2021-03-17 ENCOUNTER — Ambulatory Visit: Admitting: Clinical

## 2021-03-18 ENCOUNTER — Encounter (INDEPENDENT_AMBULATORY_CARE_PROVIDER_SITE_OTHER): Payer: Self-pay | Admitting: Family Medicine

## 2021-03-19 NOTE — Telephone Encounter (Signed)
Dr.Wallace °

## 2021-03-20 ENCOUNTER — Ambulatory Visit: Admitting: Clinical

## 2021-03-24 ENCOUNTER — Other Ambulatory Visit: Payer: Self-pay | Admitting: Physician Assistant

## 2021-03-24 DIAGNOSIS — R1031 Right lower quadrant pain: Secondary | ICD-10-CM

## 2021-03-24 DIAGNOSIS — E611 Iron deficiency: Secondary | ICD-10-CM

## 2021-03-25 ENCOUNTER — Other Ambulatory Visit: Payer: Self-pay

## 2021-03-25 ENCOUNTER — Inpatient Hospital Stay (HOSPITAL_BASED_OUTPATIENT_CLINIC_OR_DEPARTMENT_OTHER): Admitting: Physician Assistant

## 2021-03-25 ENCOUNTER — Inpatient Hospital Stay: Attending: Hematology

## 2021-03-25 VITALS — BP 101/62 | HR 90 | Temp 97.3°F | Resp 17 | Wt 284.9 lb

## 2021-03-25 DIAGNOSIS — E611 Iron deficiency: Secondary | ICD-10-CM | POA: Diagnosis not present

## 2021-03-25 DIAGNOSIS — Z803 Family history of malignant neoplasm of breast: Secondary | ICD-10-CM | POA: Diagnosis not present

## 2021-03-25 LAB — CBC WITH DIFFERENTIAL (CANCER CENTER ONLY)
Abs Immature Granulocytes: 0.02 10*3/uL (ref 0.00–0.07)
Basophils Absolute: 0 10*3/uL (ref 0.0–0.1)
Basophils Relative: 0 %
Eosinophils Absolute: 0.1 10*3/uL (ref 0.0–0.5)
Eosinophils Relative: 1 %
HCT: 42.5 % (ref 36.0–46.0)
Hemoglobin: 14.6 g/dL (ref 12.0–15.0)
Immature Granulocytes: 0 %
Lymphocytes Relative: 23 %
Lymphs Abs: 2.1 10*3/uL (ref 0.7–4.0)
MCH: 28.6 pg (ref 26.0–34.0)
MCHC: 34.4 g/dL (ref 30.0–36.0)
MCV: 83.2 fL (ref 80.0–100.0)
Monocytes Absolute: 0.6 10*3/uL (ref 0.1–1.0)
Monocytes Relative: 6 %
Neutro Abs: 6.4 10*3/uL (ref 1.7–7.7)
Neutrophils Relative %: 70 %
Platelet Count: 321 10*3/uL (ref 150–400)
RBC: 5.11 MIL/uL (ref 3.87–5.11)
RDW: 11.9 % (ref 11.5–15.5)
WBC Count: 9.2 10*3/uL (ref 4.0–10.5)
nRBC: 0 % (ref 0.0–0.2)

## 2021-03-25 LAB — IRON AND IRON BINDING CAPACITY (CC-WL,HP ONLY)
Iron: 35 ug/dL (ref 28–170)
Saturation Ratios: 10 % — ABNORMAL LOW (ref 10.4–31.8)
TIBC: 358 ug/dL (ref 250–450)
UIBC: 323 ug/dL (ref 148–442)

## 2021-03-26 LAB — FERRITIN: Ferritin: 285 ng/mL (ref 11–307)

## 2021-03-27 ENCOUNTER — Encounter: Payer: Self-pay | Admitting: Physician Assistant

## 2021-03-27 ENCOUNTER — Telehealth: Payer: Self-pay

## 2021-03-27 NOTE — Progress Notes (Signed)
?Sarah Shah ?[phone]   [phone] ? ?PROGRESS NOTE ? ?Patient Care Team: ?Lesleigh Noe, MD as PCP - General (Family Medicine) ?Lelon Huh, MD as Consulting Physician (Pediatrics) ? ?CHIEF COMPLAINTS/PURPOSE OF CONSULTATION:  ?"Iron deficiency " ? ?CURRENT THERAPY: ?Ferrous sulfate 325 mg once daily ?IV Feraheme as needed. Received 3 doses. Last dose on 10/07/2020.  ? ?HISTORY OF PRESENTING ILLNESS:  ?Sarah Shah 21 y.o. female who returns for a follow up for iron deficiency. She is accompanied by her mother for this visit.  ? ?On exam today, Sarah Shah reports that her energy levels oscillate.  She tries to complete her daily activities on her own.  She denies any nausea or vomiting.  Her abdominal pain has improved but recurs with certain positional changes.  She has intermittent episodes of diarrhea versus constipation.She has no signs of easy bruising or signs of bleeding except for her menstrual cycle.  Her menstrual bleeding is mild since she has been on birth control. She denies any fevers, chills, night sweats, shortness of breath, chest pain or cough.  She has no other complaints.  Rest the 10 point ROS is below ? ?MEDICAL HISTORY:  ?Past Medical History:  ?Diagnosis Date  ? ADHD (attention deficit hyperactivity disorder)   ? Anxiety   ? Autism spectrum   ? Constipated   ? Constipation   ? Depression   ? Development delay   ? GERD (gastroesophageal reflux disease)   ? Insulin resistance   ? Iron deficiency   ? Learning disability   ? Obesity   ? ODD (oppositional defiant disorder)   ? PCOS (polycystic ovarian syndrome)   ? PMDD (premenstrual dysphoric disorder)   ? Pneumonia   ? 55 mos old and 70 mos old  ? Visual acuity reduced   ? glasses  ? ? ?SURGICAL HISTORY: ?Past Surgical History:  ?Procedure Laterality Date  ? BIOPSY  02/17/2021  ? Procedure: BIOPSY;  Surgeon: Leeana Creer Shipper, MD;  Location: [facility];  Service: Endoscopy;;  ? COLONOSCOPY WITH  PROPOFOL N/A 02/17/2021  ? Procedure: COLONOSCOPY WITH PROPOFOL;  Surgeon: Rockford Leinen Shipper, MD;  Location: [facility];  Service: Endoscopy;  Laterality: N/A;  ? ESOPHAGOGASTRODUODENOSCOPY (EGD) WITH PROPOFOL N/A 02/17/2021  ? Procedure: ESOPHAGOGASTRODUODENOSCOPY (EGD) WITH PROPOFOL;  Surgeon: Maddix Kliewer Shipper, MD;  Location: [facility];  Service: Endoscopy;  Laterality: N/A;  ? TOOTH EXTRACTION N/A 01/03/2018  ? Procedure: SURGICAL EXTRACTION OF TEETH #1, 16, 17, 32;  Surgeon: Michael Litter, DMD;  Location: [place];  Service: Oral Surgery;  Laterality: N/A;  ? ? ?SOCIAL HISTORY: ?Social History  ? ?Socioeconomic History  ? Marital status: Single  ?  Spouse name: Not on file  ? Number of children: 0  ? Years of education: Not on file  ? Highest education level: Not on file  ?Occupational History  ? Occupation: student  ?Tobacco Use  ? Smoking status: Never  ? Smokeless tobacco: Never  ?Vaping Use  ? Vaping Use: Never used  ?Substance and Sexual Activity  ? Alcohol use: No  ? Drug use: Not on file  ? Sexual activity: Never  ?Other Topics Concern  ? Not on file  ?Social History Narrative  ? Will start 12th grade at [employer] for the 22/23 school year.   ?   ? 07/20/19  ? Enjoys: sleep, paints, sewing (sock dolls), animal care  ? From: born in [place] but moved her when she was little  ? Who  is at home: with mom Hilda Blades, stepdad Gerald Stabs, and sister Wilburn Cornelia  ? Pets: loves her [place] pigs, chinchilla, ducks, dogs, classroom bunny  ? School: [employer]   ? Grade: 11th grade this fall  ?   ? Family: good relationship with mom and family   ?   ?   ?   ? Exercise: walking with mom   ? Diet: avoiding reflux worsening food  ?   ? Safety  ? Seat belts: Yes   ? Guns: Yes  and secure  ? Safe in relationships: Yes   ? Helmets: Yes   ? Smoke Exposure at home: No  ? Bullying: Yes  and knows who she can ask for help and feels  ? ?Social Determinants of Health  ? ?Financial Resource Strain: Not on file  ?Food Insecurity: No Food  Insecurity  ? Worried About Charity fundraiser in the Last Year: Never true  ? Ran Out of Food in the Last Year: Never true  ?Transportation Needs: Not on file  ?Physical Activity: Not on file  ?Stress: Not on file  ?Social Connections: Not on file  ?Intimate Partner Violence: Not on file  ? ? ?FAMILY HISTORY: ?Family History  ?Problem Relation Age of Onset  ? Diabetes Mother   ? Hypertension Mother   ? Anxiety disorder Mother   ? Other Mother   ?     Premenstrual dysphoic disorder  ? Obesity Mother   ? Stroke Mother   ? Colon polyps Mother   ? Irritable bowel syndrome Mother   ? Diabetes Father   ?     type 1  ? Depression Father   ? Anxiety disorder Father   ? Obesity Father   ? Anxiety disorder Sister   ? ADD / ADHD Sister   ?     ADD  ? Other Sister   ?     Premenstrual dysphoric disorder  ? Atrial fibrillation Maternal Grandmother   ? Diabetes Maternal Grandmother   ? Heart failure Maternal Grandmother   ? Hypertension Maternal Grandfather   ? Iron deficiency Maternal Aunt   ? Breast cancer Maternal Aunt   ? ? ?ALLERGIES:  has No Known Allergies. ? ?MEDICATIONS:  ?Current Outpatient Medications  ?Medication Sig Dispense Refill  ? acetaminophen (TYLENOL) 500 MG tablet Take 1,000 mg by mouth every 8 (eight) hours as needed for moderate pain.    ? [START ON 03/31/2021] Amphetamine Sulfate (EVEKEO) 10 MG TABS Take 2 tabs in AM, and 2 tabs at lunch. May take 1 tab at 4 PM PRN 150 tablet 0  ? blood glucose meter kit and supplies KIT Dispense based on patient and insurance preference. Use daily as directed. 1 each 0  ? busPIRone (BUSPAR) 10 MG tablet 20 mg in the morning, 20 mg at lunch, 10-20 mg at 4p as needed, and can take up to 60 mg total daily 540 tablet 1  ? cloNIDine HCl (KAPVAY) 0.1 MG TB12 ER tablet Take 2 tablets (0.2 mg total) by mouth 2 (two) times daily. Take 2 tablets by mouth 2 times daily (Patient taking differently: Take 0.2 mg by mouth 2 (two) times daily. current dose is one tablet PO BID) 360  tablet 1  ? dicyclomine (BENTYL) 20 MG tablet Take 1 tablet (20 mg total) by mouth every 6 (six) hours. (Patient taking differently: Take 20 mg by mouth every 6 (six) hours as needed for spasms.) 60 tablet 0  ? docusate sodium (COLACE)  100 MG capsule Take 100 mg by mouth at bedtime.    ? ferrous sulfate 325 (65 FE) MG tablet Take 325 mg by mouth at bedtime.    ? FLUoxetine (PROZAC) 40 MG capsule Take 1 capsule (40 mg total) by mouth daily. TAKE 1 CAPSULE(40 MG) BY MOUTH DAILY 90 capsule 3  ? omeprazole (PRILOSEC) 40 MG capsule Take 1 capsule (40 mg total) by mouth daily. 90 capsule 2  ? ondansetron (ZOFRAN ODT) 4 MG disintegrating tablet Take 1 tablet (4 mg total) by mouth every 8 (eight) hours as needed for nausea or vomiting. 20 tablet 0  ? Probiotic Product (PROBIOTIC DAILY PO) Take 1 capsule by mouth daily.    ? tirzepatide (MOUNJARO) 5 MG/0.5ML Pen Inject 5 mg into the skin once a week. 6 mL 3  ? Topiramate ER (TROKENDI XR) 50 MG CP24 Take 1 capsule by mouth daily. (Patient taking differently: Take 1 capsule by mouth at bedtime.) 30 capsule 1  ? VIORELE 0.15-0.02/0.01 MG (21/5) tablet TAKE 1 TABLET BY MOUTH DAILY 28 tablet 11  ? vitamin C (ASCORBIC ACID) 500 MG tablet Take 500 mg by mouth at bedtime.    ? ?No current facility-administered medications for this visit.  ? ? ?REVIEW OF SYSTEMS:   ?Constitutional: ( - ) fevers, ( - )  chills , ( - ) night sweats ?Eyes: ( - ) blurriness of vision, ( - ) double vision, ( - ) watery eyes ?Ears, nose, mouth, throat, and face: ( - ) mucositis, ( - ) sore throat ?Respiratory: ( - ) cough, ( - ) dyspnea, ( - ) wheezes ?Cardiovascular: ( - ) palpitation, ( - ) chest discomfort, ( - ) lower extremity swelling ?Gastrointestinal:  ( - ) nausea, ( - ) heartburn, ( - ) change in bowel habits ?Skin: ( - ) abnormal skin rashes ?Lymphatics: ( - ) new lymphadenopathy, ( - ) easy bruising ?Neurological: ( - ) numbness, ( - ) tingling, ( - ) new weaknesses ?Behavioral/Psych: ( - )  mood change, ( - ) new changes  ?All other systems were reviewed with the patient and are negative. ? ?PHYSICAL EXAMINATION: ?Not performed due to virtual visit ? ? ?LABORATORY DATA:  ?I have reviewed the data as

## 2021-03-27 NOTE — Telephone Encounter (Signed)
-----   Message from Briant Cedar, PA-C sent at 03/27/2021 12:33 PM EDT ----- ?Continue iron pills. Recommend another dose of IV iron due to decreased saturation %.  ?

## 2021-03-27 NOTE — Telephone Encounter (Signed)
Pt's mother, Debra, advised withVU 

## 2021-03-28 ENCOUNTER — Telehealth: Payer: Self-pay | Admitting: *Deleted

## 2021-03-28 NOTE — Telephone Encounter (Signed)
Received call back from pt's mother. She is asking about lab results and any further appts. Advised that , per pt's chart, Sarah Shah needs 1 more dose of IV iron and she is to continue oral iron. Advised to expect a call from scheduling in the next few days. ? ?The mother voiced understanding. ?

## 2021-04-02 ENCOUNTER — Inpatient Hospital Stay

## 2021-04-02 ENCOUNTER — Other Ambulatory Visit: Payer: Self-pay

## 2021-04-02 VITALS — BP 101/57 | HR 88 | Temp 98.5°F | Resp 17

## 2021-04-02 DIAGNOSIS — E611 Iron deficiency: Secondary | ICD-10-CM

## 2021-04-02 MED ORDER — SODIUM CHLORIDE 0.9 % IV SOLN: Freq: Once | INTRAVENOUS | Status: AC

## 2021-04-02 MED ORDER — SODIUM CHLORIDE 0.9 % IV SOLN
510.0000 mg | Freq: Once | INTRAVENOUS | Status: AC
  Administered 2021-04-02: 510 mg via INTRAVENOUS
  Filled 2021-04-02: qty 17

## 2021-04-02 NOTE — Patient Instructions (Signed)

## 2021-04-03 ENCOUNTER — Ambulatory Visit

## 2021-04-07 ENCOUNTER — Encounter (INDEPENDENT_AMBULATORY_CARE_PROVIDER_SITE_OTHER): Payer: Self-pay | Admitting: Family Medicine

## 2021-04-07 ENCOUNTER — Encounter: Payer: Self-pay | Admitting: Physician Assistant

## 2021-04-10 ENCOUNTER — Ambulatory Visit (INDEPENDENT_AMBULATORY_CARE_PROVIDER_SITE_OTHER): Admitting: Family Medicine

## 2021-04-16 ENCOUNTER — Ambulatory Visit (INDEPENDENT_AMBULATORY_CARE_PROVIDER_SITE_OTHER): Admitting: Nurse Practitioner

## 2021-04-16 ENCOUNTER — Encounter (INDEPENDENT_AMBULATORY_CARE_PROVIDER_SITE_OTHER): Payer: Self-pay | Admitting: Nurse Practitioner

## 2021-04-16 VITALS — BP 104/67 | HR 89 | Temp 98.3°F | Ht 64.0 in | Wt 283.0 lb

## 2021-04-16 DIAGNOSIS — E669 Obesity, unspecified: Secondary | ICD-10-CM

## 2021-04-16 DIAGNOSIS — R7301 Impaired fasting glucose: Secondary | ICD-10-CM | POA: Diagnosis not present

## 2021-04-16 DIAGNOSIS — E559 Vitamin D deficiency, unspecified: Secondary | ICD-10-CM | POA: Diagnosis not present

## 2021-04-16 DIAGNOSIS — G43809 Other migraine, not intractable, without status migrainosus: Secondary | ICD-10-CM | POA: Diagnosis not present

## 2021-04-16 DIAGNOSIS — K219 Gastro-esophageal reflux disease without esophagitis: Secondary | ICD-10-CM | POA: Diagnosis not present

## 2021-04-16 DIAGNOSIS — Z0000 Encounter for general adult medical examination without abnormal findings: Secondary | ICD-10-CM

## 2021-04-16 DIAGNOSIS — Z6841 Body Mass Index (BMI) 40.0 and over, adult: Secondary | ICD-10-CM

## 2021-04-16 MED ORDER — OMEPRAZOLE 40 MG PO CPDR: 40.0000 mg | DELAYED_RELEASE_CAPSULE | Freq: Every day | ORAL | 2 refills | Status: DC

## 2021-04-16 MED ORDER — TOPIRAMATE ER 50 MG PO CAP24: 1.0000 | ORAL_CAPSULE | Freq: Every day | ORAL | 1 refills | Status: DC

## 2021-04-16 MED ORDER — TIRZEPATIDE 5 MG/0.5ML ~~LOC~~ SOAJ: 5.0000 mg | SUBCUTANEOUS | 3 refills | Status: DC

## 2021-04-17 ENCOUNTER — Ambulatory Visit (INDEPENDENT_AMBULATORY_CARE_PROVIDER_SITE_OTHER): Payer: Medicaid Other | Admitting: Clinical

## 2021-04-17 DIAGNOSIS — F840 Autistic disorder: Secondary | ICD-10-CM

## 2021-04-17 DIAGNOSIS — F4320 Adjustment disorder, unspecified: Secondary | ICD-10-CM | POA: Diagnosis not present

## 2021-04-17 LAB — COMPREHENSIVE METABOLIC PANEL
ALT: 10 IU/L (ref 0–32)
AST: 9 IU/L (ref 0–40)
Albumin/Globulin Ratio: 1.4 (ref 1.2–2.2)
Albumin: 4.2 g/dL (ref 3.9–5.0)
Alkaline Phosphatase: 101 IU/L (ref 42–106)
BUN/Creatinine Ratio: 12 (ref 9–23)
BUN: 8 mg/dL (ref 6–20)
Bilirubin Total: <0.2 mg/dL (ref 0.0–1.2)
CO2: 22 mmol/L (ref 20–29)
Calcium: 10 mg/dL (ref 8.7–10.2)
Chloride: 103 mmol/L (ref 96–106)
Creatinine, Ser: 0.66 mg/dL (ref 0.57–1.00)
Globulin, Total: 2.9 g/dL (ref 1.5–4.5)
Glucose: 84 mg/dL (ref 70–99)
Potassium: 4.1 mmol/L (ref 3.5–5.2)
Sodium: 140 mmol/L (ref 134–144)
Total Protein: 7.1 g/dL (ref 6.0–8.5)
eGFR: 130 mL/min/{1.73_m2} (ref >59)

## 2021-04-17 LAB — VITAMIN D 25 HYDROXY (VIT D DEFICIENCY, FRACTURES): Vit D, 25-Hydroxy: 36.8 ng/mL (ref 30.0–100.0)

## 2021-04-17 LAB — HEMOGLOBIN A1C
Est. average glucose Bld gHb Est-mCnc: 94 mg/dL
Hgb A1c MFr Bld: 4.9 % (ref 4.8–5.6)

## 2021-04-17 LAB — TSH: TSH: 3.51 u[IU]/mL (ref 0.450–4.500)

## 2021-04-17 LAB — INSULIN, RANDOM: INSULIN: 22 u[IU]/mL (ref 2.6–24.9)

## 2021-04-17 NOTE — Progress Notes (Signed)
? ? ? ? ? ? ? ? ? ? ? ? ? ? ?  Eileen Leuthe, [name] ?
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
## 2021-04-17 NOTE — Progress Notes (Signed)
? ? ? ?Chief Complaint:  ? ?OBESITY ?Sarah Shah is here to discuss her progress with her obesity treatment plan along with follow-up of her obesity related diagnoses. Sarah Shah is on practicing portion control and making smarter food choices, such as increasing vegetables and decreasing simple carbohydrates and states she is following her eating plan approximately 40% of the time. Sarah Shah states she is walking at school for 60 minutes 7 times per week. ? ?Today's visit was #: 33 ?Starting weight: 320 lbs ?Starting date: 03/01/2019 ?Today's weight: 283 lbs ?Today's date: 04/16/2021 ?Total lbs lost to date: 23 ?Total lbs lost since last in-office visit: 3 ? ?Interim History: Sarah Shah was seen here last on 01/30/2021 and has done well. She is making healthy choices and watching her portion size, not skipping meals. She is drinking water daily. Denies sugary drinks. She has been meal prepping, denies hunger, occassionally craves M&M's. ? ?Subjective:  ? ?1. Gastroesophageal reflux disease, unspecified whether esophagitis present ?Litsy is currently taking Prilosec 40 mg. Denies any side effects. She is doing well. ? ?2. Impaired fasting glucose, with polyphagia ?Sarah Shah is currently taking Mounjaro 5 mg. She missed her last dose and had some diarrhea afterwards. ? ?3. Other migraine without status migrainosus, not intractable ?Sarah Shah has been out of Trokendi XR 50 mg, x 2wk. Denies side effects and migraines while taking Trokendi. ? ?4. Vitamin D deficiency ?Sarah Shah is taking Vitamin D OTC, she is unsure of dose. Denies side effects. ? ?Assessment/Plan:  ? ?1. Gastroesophageal reflux disease, unspecified whether esophagitis present ?We will refill Prilosec 40 mg for 3 months with 2 refills. Side effects discussed. ? ?- Refill omeprazole (PRILOSEC) 40 MG capsule; Take 1 capsule (40 mg total) by mouth daily.  Dispense: 90 capsule; Refill: 2 ?- Comprehensive metabolic panel ?- TSH ? ?2. Impaired fasting glucose, with polyphagia ?We will refill  Mounjaro 5 mg. Labs obtained today. ? ?- Refill tirzepatide (MOUNJARO) 5 MG/0.5ML Pen; Inject 5 mg into the skin once a week.  Dispense: 6 mL; Refill: 3 ?- Hemoglobin A1c ?- Insulin, random ?- Comprehensive metabolic panel ?- TSH ? ?3. Other migraine without status migrainosus, not intractable ?We will refill Trokendi XR 50 mg for 1 month with 1 refill. ? ?- Topiramate ER (TROKENDI XR) 50 MG CP24; Take 1 capsule by mouth daily.  Dispense: 30 capsule; Refill: 1 ? ?4. Vitamin D deficiency ?Labs obtained today. ? ?- VITAMIN D 25 Hydroxy (Vit-D Deficiency, Fractures) ? ?5. Obesity, current BMI 48.6 ?Sarah Shah is currently in the action stage of change. As such, her goal is to continue with weight loss efforts. She has agreed to practicing portion control and making smarter food choices, such as increasing vegetables and decreasing simple carbohydrates.  ? ?Exercise goals: As is. ? ?Behavioral modification strategies: increasing lean protein intake, increasing water intake, and no skipping meals. ? ?Sarah Shah has agreed to call for  follow-up with our clinic. She was informed of the importance of frequent follow-up visits to maximize her success with intensive lifestyle modifications for her multiple health conditions.  ? ?Objective:  ? ?Blood pressure 104/67, pulse 89, temperature 98.3 ?F (36.8 ?C), height 5\' 4"  (1.626 m), weight 283 lb (128.4 kg), SpO2 100 %. ?Body mass index is 48.58 kg/m?. ? ?General: Cooperative, alert, well developed, in no acute distress. ?HEENT: Conjunctivae and lids unremarkable. ?Cardiovascular: Regular rhythm.  ?Lungs: Normal work of breathing. ?Neurologic: No focal deficits.  ? ?Lab Results  ?Component Value Date  ? CREATININE 0.66 04/16/2021  ? BUN 8  04/16/2021  ? NA 140 04/16/2021  ? K 4.1 04/16/2021  ? CL 103 04/16/2021  ? CO2 22 04/16/2021  ? ?Lab Results  ?Component Value Date  ? ALT 10 04/16/2021  ? AST 9 04/16/2021  ? ALKPHOS 101 04/16/2021  ? BILITOT <0.2 04/16/2021  ? ?Lab Results   ?Component Value Date  ? HGBA1C 4.9 04/16/2021  ? HGBA1C 4.7 11/28/2020  ? HGBA1C 5.1 07/25/2020  ? HGBA1C 5.5 03/25/2020  ? HGBA1C 5.2 03/12/2020  ? ?Lab Results  ?Component Value Date  ? INSULIN 22.0 04/16/2021  ? INSULIN 86.6 (H) 06/13/2020  ? INSULIN 42.6 (H) 03/25/2020  ? INSULIN 55.0 (H) 07/18/2019  ? INSULIN 35.1 (H) 03/01/2019  ? ?Lab Results  ?Component Value Date  ? TSH 3.510 04/16/2021  ? ?Lab Results  ?Component Value Date  ? CHOL 164 07/18/2019  ? HDL 52 07/18/2019  ? [place] 86 07/18/2019  ? TRIG 150 (H) 07/18/2019  ? ?Lab Results  ?Component Value Date  ? VD25OH 36.8 04/16/2021  ? VD25OH 46.3 03/25/2020  ? VD25OH 39.9 07/18/2019  ? ?Lab Results  ?Component Value Date  ? WBC 9.2 03/25/2021  ? HGB 14.6 03/25/2021  ? HCT 42.5 03/25/2021  ? MCV 83.2 03/25/2021  ? PLT 321 03/25/2021  ? ?Lab Results  ?Component Value Date  ? IRON 35 03/25/2021  ? TIBC 358 03/25/2021  ? FERRITIN 285 03/25/2021  ? ?Attestation Statements:  ? ?Reviewed by clinician on day of visit: allergies, medications, problem list, medical history, surgical history, family history, social history, and previous encounter notes. ? ?I, Brendell Tyus, am acting as transcriptionist for Everardo Pacific, FNP.Marland Kitchen ? ?I have reviewed the above documentation for accuracy and completeness, and I agree with the above. Everardo Pacific, FNP  ?

## 2021-04-23 ENCOUNTER — Encounter: Attending: Family Medicine | Admitting: Registered"

## 2021-04-29 ENCOUNTER — Encounter: Payer: Self-pay | Admitting: Physician Assistant

## 2021-05-01 ENCOUNTER — Ambulatory Visit (INDEPENDENT_AMBULATORY_CARE_PROVIDER_SITE_OTHER): Admitting: Clinical

## 2021-05-01 DIAGNOSIS — F3341 Major depressive disorder, recurrent, in partial remission: Secondary | ICD-10-CM

## 2021-05-01 DIAGNOSIS — F4320 Adjustment disorder, unspecified: Secondary | ICD-10-CM

## 2021-05-01 DIAGNOSIS — F840 Autistic disorder: Secondary | ICD-10-CM

## 2021-05-01 NOTE — Progress Notes (Signed)
[facility] Counselor/Therapist Progress Note ? ?Patient ID: Sarah Shah, MRN: [id number]   ? ?Date: 05/01/21 ? ?Time Spent: 10:07 am - 10:55 am: 48 Minutes ? ?Type of Service Provided Individual Therapy  ?Type of Contact in-person ?Provider Location: office ? ?Mental Status Exam: ?Appearance:  Neat and Well Groomed     ?Behavior: Appropriate and Sharing  ?Motor: Restlestness (slight)  ?Speech/Language:  Clear and Coherent  ?Affect: Flat  ?Mood: normal  ?Thought process: normal  ?Thought content:   WNL  ?Sensory/Perceptual disturbances:   WNL  ?Orientation: oriented to person, place, and situation  ?Attention: Fair  ?Concentration: Fair  ?Memory: WNL  ?Fund of knowledge:  Fair  ?Insight:   Fair  ?Judgment:  Fair  ?Impulse Control: Fair  ? ?Risk Assessment: ?Danger to Self:   denied ?Self-injurious Behavior:  denied ?Danger to Others:  denied ?Duty to Warn:no ? ?Presenting Problems, Reported Symptoms, and /or Interim History: Sarah Shah presented for a session to address mood, anxiety, and life stress.  She reported some difficulty about attending school during the current visit. ? ?Subjective: Sarah Shah presented for an individual outpatient therapy session. The following was addressed during sessions.  ? ?Sarah Shah indicated that she was feeling stressed about an awards banquet she was supposed to attend on the day of the visit.  Part of her distress was related to fears of her classmates behavior in public and how that may reflect upon her.  Anxiety was also related to her feeling overwhelmed in these types of situations.  Anxious thinking about appearing "childish" based on her schoolmates behavior was discussed and therapist and Sarah Shah worked to replace these thoughts with adaptive and realistic thinking.  A plan for Sarah Shah to manage feeling overwhelmed in these types of situations was also discussed including her finding a space she could go to if she needs to calm down during the meetings.  Sarah Shah discussed  the positive and negative aspects of having autism.  She discussed anxieties related to going out in public and having to check herself out at the grocery store because of the noise made by the scanners.  Plans for next year were discussed with Sarah Shah reporting that she was accepted into the community college program that she was hoping to get into.  Sarah Shah reported that after the discussion about the upcoming event her anxiety was "okay" but she described her mood as "blah".  She denied any SI or HI but noted that she was struggling because a friend of hers was depressed which was impacting her mood.  Ways that she could be supportive while still keeping a boundary were discussed.  Sarah Shah expressed some sadness about leaving her classmates after graduation and a plan to maintain contact with them was discussed.  Sarah Shah indicated that she wanted to drive and a plan to make progress on this goal was discussed. ? ?Interventions/Psychotherapy Techniques Used During Session: Cognitive Behavioral Therapy and Social Skills Training ? ?Diagnosis: Adjustment disorder, unspecified type ? ?Autism spectrum disorder ? ?Recurrent major depressive disorder, in partial remission (HCC) ? ?MENTAL HEALTH INTERVENTIONS USED DURING TREATMENT & PATIENT'S RESPONSE TO INTERVENTIONS:  ?Short-term Objective addressed today: Sarah Shah will be able to identify any negative thinking patterns that are perpetuating anxiety or depressive symptoms and replace these with more adaptive thoughts AND Sarah Shah will be able to use appropriate coping strategies including helpful thoughts to maintain a low level of anxiety and depressive symptoms. ?Mental health techniques used: Objective was addressed in session through the use of Cognitive  Behavioral Therapy and Solution-Oriented/Positive Psychology, discussion, and making/reviewing lists. Sarah Shah  response was positive.  ?Progress Toward Goal: Progressing - Sarah Shah was able to identify and work with a therapist to  address some negative thinking assume that was causing anxiety about a specific situation as well as develop a plan to use various coping strategies to manage any anxiety or discomfort that occurred in the situation. ? ?Short-term Objective addressed today:Sarah Shah will be able to identify stressors associated with the life transitions  ?Mental health techniques used: Objective was addressed in session through the use of Cognitive Behavioral Therapy and discussion. Sarah Shah's response was positive.  ?Progress Toward Goal: Progressing - for example,Sarah Shah was able to identify missing her classmates as one of the stressors associated with her life transition. ? ? ?Short-term Objective addressed today: Sarah Shah will be able to identify areas that she needs to address to increase the likelihood of her being able to successfully live independently.   ?Mental health techniques used: Objective was addressed in session through the use of Solution-Oriented/Positive Psychology and discussion. Sarah Shah's response was positive.  ?Progress Toward Goal: Progressing -she identified driving as one of the life skills she would like to develop to facilitate independent living and a plan was begun to be developed to address this. ? ? ?PLAN  ?1. Sarah Shah  will return for a therapy session.   ?2. Homework Given: Tried driving once a week, complete the plan developed to address stress and anxiety associated with an upcoming event, think about strategies to be supportive of her friend while maintaining appropriate mood regulation boundaries. This homework will be reviewed with Sarah Shah at the next visit.  ?3. During the next session check in on mood and anxiety, check in on life transition, check in on driving.   ? ?[name], [name] ?

## 2021-05-02 NOTE — Progress Notes (Addendum)
Name: Sarah Shah ?MRN: [id number] ? ? ?Individual Treatment Plan  ? ?Plan Developed: 04/17/2021 & 05/01/2021 ?Anticipated end date: 04/2022   ? ?Of note, ASD is generally considered a lifelong condition. Specific course of this treatment is expected to be at least a year, to address some ongoing mood and anxiety Conners, life transitions, and long term life plans, as well as behaviors associated with ASD.  ?Goals of therapy will be to help manage and/or decrease symptoms associated with Sarah Shah's diagnosis to improve daily functioning. ? ?Who participated in treatment planning:  Therapist, patient and mother ?The following goals were developed in collaboration with the patient and mother  ? ?Problem/Need: Sarah Shah has a history of anxiety and mood concerns - with some ongoing challenges in these areas  ?Long-Term Goal #1: Sarah Shah will be able to identify and address any mood or anxiety challenges and will maintain a low level of anxiety and depression symptoms as evidenced by self-report. ?Short-Term Objectives: ?Objective 1A: Sarah Shah will be able to rate her mood and anxiety  ?Objective 1B: Sarah Shah will be able to identify any negative thinking patterns that are perpetuating anxiety or depressive symptoms and replace these with more adaptive thoughts.  ?Objective 1C: Sarah Shah will be able to use appropriate coping strategies including helpful thoughts to maintain a low level of anxiety and depressive symptoms. ?Interventions: Cognitive Behavioral Therapy, Systems analyst, Motivational Interviewing, and Psycho-education/Bibliotherapy  coping skills and other evidenced-based practices will be used to promote progress towards healthy functioning and to help manage decrease symptoms associated with their diagnosis.  ?Treatment Regimen: Individual skill building sessions every 2-4 weeks to address treatment goal/objective ?Target Date: 04/2022 ?Responsible Party: therapist and patient ?Person delivering treatment:  Licensed Psychologist [name], [name] will support the patient's ability to achieve the goals identified. ? ?Problem/Need: Life transition - [name] is in the process of completing a major life transition including graduating from high school, starting college, and potentially working. ?Long-Term Goal #2: [name] will successfully navigate these life transitions including being able to manage her stress level. ?Short-Term Objectives: ?Objective 2A: Sarah Shah will be able to identify stressors associated with the life transitions  ?Objective 2B: Sarah Shah will be able to identify strategies to help navigate stressors associated with the life transitions and implement them  ?Interventions: Assertiveness/Communication, Systems analyst, and Psycho-education/Bibliotherapy , and other evidenced-based practices will be used to promote progress towards healthy functioning and to help manage decrease symptoms associated with their diagnosis.  ?Treatment Regimen: Individual skill building sessions every 2-4 weeks to address treatment goal/objective ?Target Date: 04/2022 ?Responsible Party: therapist and patient ?Person delivering treatment: Licensed Psychologist [name], [name] will support the patient's ability to achieve the goals identified. ? ?Problem/Need: Life skills - [name] needs to develop some additional skills to facilitate independent living. ?Long-Term Goal #3: Sarah Shah will identify the additional life skills necessary to facilitate independent living. ?Short-Term Objectives: ?Objective 3A: Sarah Shah will be able to identify areas that she needs to address to increase the likelihood of her being able to successfully live independently.   ?Objective 3B: Sarah Shah will be able to create a plan to address weaknesses described in objective 3A.   ?Objective 3C: Sarah Shah will be able to implement steps on the plan developed in objective 3B. ?Interventions: Motivational Interviewing, Solution-Oriented/Positive Psychology, and  Psycho-education/Bibliotherapy  daily life skills, and other evidenced-based practices will be used to promote progress towards healthy functioning and to help manage decrease symptoms associated with their diagnosis.  ?Treatment Regimen: Individual skill  building sessions every 2-4 weeks to address treatment goal/objective ?Target Date: 04/2022 ?Responsible Party: therapist and patient ?Person delivering treatment: Licensed Psychologist [name], [name] will support the patient's ability to achieve the goals identified. ? ? ? ?patient and mother(during the first session) participated in treatment planning: ?_X_ contributed to goals and plan ?_X_ aware of plan content ?__ reviewed written plan ?__ refused to participate ?__ unable to participate because _________________________________________ ? ? ?Progress and treatment plan will be reviewed periodically (at least every 12 months, or sooner if needed). This treatment plan was reviewed with Sarah Shah  and patient signed in agreement.  ? ? ?[name], [name] ?

## 2021-05-05 ENCOUNTER — Encounter (INDEPENDENT_AMBULATORY_CARE_PROVIDER_SITE_OTHER): Payer: Self-pay | Admitting: Pediatric Endocrinology

## 2021-05-15 ENCOUNTER — Ambulatory Visit (INDEPENDENT_AMBULATORY_CARE_PROVIDER_SITE_OTHER): Admitting: Clinical

## 2021-05-15 DIAGNOSIS — F4320 Adjustment disorder, unspecified: Secondary | ICD-10-CM

## 2021-05-15 DIAGNOSIS — F840 Autistic disorder: Secondary | ICD-10-CM | POA: Diagnosis not present

## 2021-05-15 NOTE — Progress Notes (Signed)
[facility]al Health Counselor/Therapist Progress Note ? ?Patient ID: Sarah Shah, MRN: [id number]   ? ?Date: 05/15/21 ? ?Time Spent: 9:15 am - 10:00 am: 45 Minutes ? ?Type of Service Provided Individual Therapy  ?Type of Contact in-person ?Location: office ? ?Mental Status Exam: ?Appearance:  Casual     ?Behavior: Appropriate  ?Motor: Normal  ?Speech/Language:  Clear and Coherent  ?Affect: Appropriate  ?Mood: normal  ?Thought process: normal  ?Thought content:   WNL  ?Sensory/Perceptual disturbances:   WNL  ?Orientation: oriented to person, place, time/date, and situation  ?Attention: Fair  ?Concentration: Fair  ?Memory: WNL  ?Fund of knowledge:  Fair  ?Insight:   Fair  ?Judgment:  Fair  ?Impulse Control: Good  ? ?Risk Assessment: No apparent indicators of SI or HI during the current visit ? ?Presenting Problems, Reported Symptoms, and /or Interim History: Sarah Shah reported that she was doing kay, but her mood had varied between happy and sad.  She also expressed some anxiety about graduation. ? ?Subjective: Sarah Shah presented for an individual outpatient therapy session. The following was addressed during sessions.  ? ?Sarah Shah reported that the banquet she attended was okay overall.  She did become overwhelmed and cry after the teacher yelled at her for being "bossy".  She was able to follow the plan discussed during the last session and went to the restroom to calm down.  She was also able to ignore the behavior of the other students and use positive thinking to help her feel more comfortable despite some of their negative behavior.  During the current visit she was feeling positive.  She indicated that she had been spending some time with her friend in a way that felt not too overwhelming to her.  He also seems to be doing somewhat better.  She reported that in the future she could provide support to a friend while maintaining boundaries.  Plans for next year were discussed.  Sarah Shah was going to work on  necessary paperwork over the weekend.  She discussed a disagreement that she had with her mother and her difficulty coping with her mother raising her voice.  Ways to try to communicate in a more supportive and healthy way were discussed, with therapist modeling how Sarah Shah could ask for a break.  Sarah Shah reported that she wants to be treated less like a child by her parent but still wants to be able to be a kid and engage in fun activities on the weekend.  Anxiety related to not knowing the future was discussed briefly. ? ?Interventions/Psychotherapy Techniques Used During Session: Cognitive Behavioral Therapy ? ?Diagnosis: Adjustment disorder, unspecified type ? ?Autism spectrum disorder ? ?MENTAL HEALTH INTERVENTIONS USED DURING TREATMENT & PATIENT'S RESPONSE TO INTERVENTIONS:  ?Short-term Objective addressed today: Sarah Shah will be able to identify stressors associated with the life transitions AND Sarah Shah will be able to identify strategies to help navigate stressors associated with the life transitions and implement them  ?Mental health techniques used: Objective was addressed in session through the use of Cognitive Behavioral Therapy and Psycho-education/Bibliotherapy, discussion, and making/reviewing lists. Sarah Shah's response was positive.  ?Progress Toward Goal: progressing  ? ?Short-term Objective addressed today: Sarah Shah will be able to rate her mood and anxiety  ?Mental health techniques used: Objective was addressed in session through the use of Cognitive Behavioral Therapy and discussion. Sarah Shah's response was mixed. - she can talk about how she is feeling in general terms but struggles to rate her feelings.  ?Progress Toward Goal: some progress ?   ? ?  PLAN  ?1. Sarah Shah will return for a therapy session.   ?2. Homework Given:  talk to mom about her plan for when there is a disagreement (taking some space). This homework will be reviewed with Sarah Shah at the next visit.  ?3. During the next session check in on mood and  anxiety, discuss preparations for graduation, discussed next steps after graduation.   ? ? ?[name], [name] ? Individual Treatment Plan  - please see the notes from 04/17/2021 & 05/01/2021 for complete treatment plan information. ? ?Problem/Need: Sarah Shah has a history of anxiety and mood concerns - with some ongoing challenges in these areas  ?Long-Term Goal #1: Sarah Shah will be able to identify and address any mood or anxiety challenges and will maintain a low level of anxiety and depression symptoms as evidenced by self-report. ?Short-Term Objectives: ?Objective 1A: Sarah Shah will be able to rate her mood and anxiety  ?Objective 1B: Sarah Shah will be able to identify any negative thinking patterns that are perpetuating anxiety or depressive symptoms and replace these with more adaptive thoughts.  ?Objective 1C: Sarah Shah will be able to use appropriate coping strategies including helpful thoughts to maintain a low level of anxiety and depressive symptoms. ?Interventions: Cognitive Behavioral Therapy, Psychologist, occupational, Motivational Interviewing, and Psycho-education/Bibliotherapy  coping skills and other evidenced-based practices will be used to promote progress towards healthy functioning and to help manage decrease symptoms associated with their diagnosis.  ?Treatment Regimen: Individual skill building sessions every 2-4 weeks to address treatment goal/objective ?Target Date: 04/2022 ?Responsible Party: therapist and patient ?Person delivering treatment: Licensed Psychologist [name], [name] will support the patient's ability to achieve the goals identified. ?Resolved:  No ? ?Problem/Need: Life transition - Sarah Shah is in the process of completing a major life transition including graduating from high school, starting college, and potentially working. ?Long-Term Goal #2: Sarah Shah will successfully navigate these life transitions including being able to manage her stress level. ?Short-Term Objectives: ?Objective 2A: Sarah Shah will  be able to identify stressors associated with the life transitions  ?Objective 2B: Sarah Shah will be able to identify strategies to help navigate stressors associated with the life transitions and implement them  ?Interventions: Assertiveness/Communication, Psychologist, occupational, and Psycho-education/Bibliotherapy , and other evidenced-based practices will be used to promote progress towards healthy functioning and to help manage decrease symptoms associated with their diagnosis.  ?Treatment Regimen: Individual skill building sessions every 2-4 weeks to address treatment goal/objective ?Target Date: 04/2022 ?Responsible Party: therapist and patient ?Person delivering treatment: Licensed Psychologist [name], [name] will support the patient's ability to achieve the goals identified. ?Resolved:  No ? ?Problem/Need: Life skills - [name] needs to develop some additional skills to facilitate independent living. ?Long-Term Goal #3: Jentri will identify the additional life skills necessary to facilitate independent living. ?Short-Term Objectives: ?Objective 3A: Aidynn will be able to identify areas that she needs to address to increase the likelihood of her being able to successfully live independently.   ?Objective 3B: Sabel will be able to create a plan to address weaknesses described in objective 3A.   ?Objective 3C: Amran will be able to implement steps on the plan developed in objective 3B. ?Interventions: Motivational Interviewing, Solution-Oriented/Positive Psychology, and Psycho-education/Bibliotherapy  daily life skills, and other evidenced-based practices will be used to promote progress towards healthy functioning and to help manage decrease symptoms associated with their diagnosis.  ?Treatment Regimen: Individual skill building sessions every 2-4 weeks to address treatment goal/objective ?Target Date: 04/2022 ?Responsible Party: therapist and patient ?Person delivering treatment:  Licensed Psychologist Ronnie DerbyEileen Leuthe,  [name] will support the patient's ability to achieve the goals identified. ?Resolved:  No ? ?Ronnie DerbyEileen Leuthe, [name] ?

## 2021-05-16 ENCOUNTER — Encounter: Payer: Self-pay | Admitting: Physician Assistant

## 2021-05-18 ENCOUNTER — Other Ambulatory Visit (INDEPENDENT_AMBULATORY_CARE_PROVIDER_SITE_OTHER): Payer: Self-pay | Admitting: Pediatric Endocrinology

## 2021-05-20 ENCOUNTER — Encounter: Payer: Self-pay | Admitting: Physician Assistant

## 2021-05-26 ENCOUNTER — Other Ambulatory Visit: Payer: Self-pay | Admitting: Physician Assistant

## 2021-05-26 DIAGNOSIS — E611 Iron deficiency: Secondary | ICD-10-CM

## 2021-05-27 ENCOUNTER — Ambulatory Visit: Admitting: Adult Health

## 2021-05-28 ENCOUNTER — Encounter (INDEPENDENT_AMBULATORY_CARE_PROVIDER_SITE_OTHER): Payer: Self-pay | Admitting: Pediatric Endocrinology

## 2021-05-28 ENCOUNTER — Ambulatory Visit (INDEPENDENT_AMBULATORY_CARE_PROVIDER_SITE_OTHER): Admitting: Pediatric Endocrinology

## 2021-05-28 VITALS — BP 124/78 | HR 88 | Ht 63.82 in | Wt 286.0 lb

## 2021-05-28 DIAGNOSIS — E349 Endocrine disorder, unspecified: Secondary | ICD-10-CM | POA: Diagnosis not present

## 2021-05-28 DIAGNOSIS — E282 Polycystic ovarian syndrome: Secondary | ICD-10-CM

## 2021-05-28 MED ORDER — DESOGESTREL-ETHINYL ESTRADIOL 0.15-0.02/0.01 MG (21/5) PO TABS: 1.0000 | ORAL_TABLET | Freq: Every day | ORAL | 3 refills | Status: DC
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
## 2021-05-28 NOTE — Progress Notes (Signed)
Second note opened in error.

## 2021-05-29 ENCOUNTER — Inpatient Hospital Stay: Attending: Hematology

## 2021-05-29 ENCOUNTER — Other Ambulatory Visit: Payer: Self-pay

## 2021-05-29 DIAGNOSIS — E611 Iron deficiency: Secondary | ICD-10-CM | POA: Insufficient documentation

## 2021-05-29 LAB — CBC WITH DIFFERENTIAL (CANCER CENTER ONLY)
Abs Immature Granulocytes: 0.01 10*3/uL (ref 0.00–0.07)
Basophils Absolute: 0 10*3/uL (ref 0.0–0.1)
Basophils Relative: 1 %
Eosinophils Absolute: 0.1 10*3/uL (ref 0.0–0.5)
Eosinophils Relative: 1 %
HCT: 40.4 % (ref 36.0–46.0)
Hemoglobin: 14.4 g/dL (ref 12.0–15.0)
Immature Granulocytes: 0 %
Lymphocytes Relative: 26 %
Lymphs Abs: 2.1 10*3/uL (ref 0.7–4.0)
MCH: 29.5 pg (ref 26.0–34.0)
MCHC: 35.6 g/dL (ref 30.0–36.0)
MCV: 82.8 fL (ref 80.0–100.0)
Monocytes Absolute: 0.7 10*3/uL (ref 0.1–1.0)
Monocytes Relative: 9 %
Neutro Abs: 5.3 10*3/uL (ref 1.7–7.7)
Neutrophils Relative %: 63 %
Platelet Count: 312 10*3/uL (ref 150–400)
RBC: 4.88 MIL/uL (ref 3.87–5.11)
RDW: 12.1 % (ref 11.5–15.5)
WBC Count: 8.2 10*3/uL (ref 4.0–10.5)
nRBC: 0 % (ref 0.0–0.2)

## 2021-05-29 LAB — RETIC PANEL
Immature Retic Fract: 3.8 % (ref 2.3–15.9)
RBC.: 4.95 MIL/uL (ref 3.87–5.11)
Retic Count, Absolute: 94.5 10*3/uL (ref 19.0–186.0)
Retic Ct Pct: 1.9 % (ref 0.4–3.1)
Reticulocyte Hemoglobin: 33.5 pg (ref >27.9)

## 2021-05-29 LAB — IRON AND IRON BINDING CAPACITY (CC-WL,HP ONLY)
Iron: 56 ug/dL (ref 28–170)
Saturation Ratios: 17 % (ref 10.4–31.8)
TIBC: 325 ug/dL (ref 250–450)
UIBC: 269 ug/dL (ref 148–442)

## 2021-05-30 ENCOUNTER — Telehealth: Payer: Self-pay

## 2021-05-30 LAB — FERRITIN: Ferritin: 290 ng/mL (ref 11–307)

## 2021-05-30 NOTE — Telephone Encounter (Signed)
-----   Message from Briant Cedar, PA-C sent at 05/30/2021  9:02 AM EDT ----- Please notify patient's mother than labs look good without any evidence of iron deficiency.

## 2021-05-30 NOTE — Telephone Encounter (Signed)
Pt's mother, Stanton Kidney, advised withVU

## 2021-06-02 ENCOUNTER — Ambulatory Visit: Admitting: Family Medicine

## 2021-06-05 ENCOUNTER — Encounter: Payer: Self-pay | Admitting: Physician Assistant

## 2021-06-05 ENCOUNTER — Ambulatory Visit (INDEPENDENT_AMBULATORY_CARE_PROVIDER_SITE_OTHER): Payer: Medicaid Other | Admitting: Clinical

## 2021-06-05 DIAGNOSIS — F3341 Major depressive disorder, recurrent, in partial remission: Secondary | ICD-10-CM | POA: Diagnosis not present

## 2021-06-05 DIAGNOSIS — F840 Autistic disorder: Secondary | ICD-10-CM

## 2021-06-05 DIAGNOSIS — F4320 Adjustment disorder, unspecified: Secondary | ICD-10-CM | POA: Diagnosis not present

## 2021-06-05 NOTE — Progress Notes (Signed)
[facility] Counselor/Therapist Progress Note  Patient ID: Sarah Shah, MRN: [id number]    Date: 06/05/2021  Time Spent: 9:05 am - 10:05 am: 60 Minutes  Type of Service Provided Individual Therapy  Type of Contact in-person Location: office   Mental Status Exam: Appearance:  Fairly Groomed     Behavior: Appropriate  Motor: Normal  Speech/Language:  Clear and Coherent  Affect: Flat and Tearful at times  Mood: A bit down and stressed  Thought process: normal  Thought content:   WNL  Sensory/Perceptual disturbances:   WNL  Orientation: oriented to person, place, time/date, and situation  Attention: Good  Concentration: Fair  Memory: WNL  Fund of knowledge:  Good  Insight:   Fair  Judgment:  Fair  Impulse Control: Fair   Risk Assessment: Danger to Self:  No -denied SI Self-injurious Behavior:  None reported Danger to Others: No -denied HI though indicated that she has had some thoughts of punching others when frustrated Duty to Warn:no  Presenting Problems, Reported Symptoms, and /or Interim History: [name] presented for a session to address mood and life challenges.  Of note, her mother briefly was a part of the session as well.  [name] verbally indicated that she was fine with having her mother present.  Her mother had questions about whether Chakita needed a reevaluation and was provided some guidance about next steps to determine if this is necessary.   Subjective: Sarah Shah  presented for an individual outpatient therapy session. The following was addressed during sessions.   Sarah Shah reported that she has been feeling tired and stressed regularly.  She has been frustrated with some of her classmates and graduation is coming up soon.  She indicated on the day of the visit that she did not feel like getting out of bed and was not sure if she was experiencing symptoms of depression.  She denied feeling sad, down ,or hopeless, and denied thoughts of suicide.  She  reported that she has had trouble sleeping but this was related to anxiety thoughts at night.  Ways to manage some of her anxiety thoughts were discussed.  An upsetting incident with a peer was discussed.  This incident has left Sarah Shah feeling very uncomfortable and upset at school.  Sarah Shah was coached about asking her teachers to be able to sit so that she does not have to look at or engage with this individual for the rest of the school year.  Ways to manage distress if her teachers do not allow her to move in school was discussed along with coping strategies that she can use at home.  She had an outburst at school when she was frustrated that no one was listening to her.  Strategies she may use to manage the situation differently in the future were discussed.   Interventions/Psychotherapy Techniques Used During Session: Cognitive Behavioral Therapy and Assertiveness/Communication  Diagnosis: Recurrent major depressive disorder, in partial remission (HCC)  Autism spectrum disorder  Adjustment disorder, unspecified type  MENTAL HEALTH INTERVENTIONS USED DURING TREATMENT & PATIENT'S RESPONSE TO INTERVENTIONS:  Short-term Objective addressed today:Meta will be able to identify any negative thinking patterns that are perpetuating anxiety or depressive symptoms and replace these with more adaptive thoughts AND Marnette will be able to use appropriate coping strategies including helpful thoughts to maintain a low level of anxiety and depressive symptoms. Mental health techniques used: Objective was addressed in session through the use of Cognitive Behavioral Therapy and Assertiveness/Communication and discussion. Elowyn's response was generally positive.  Progress Toward Goal: Progressing     PLAN  1. Sarah Shah  will return for a therapy session.   2. Homework Given: Focus on graduation and use her helpful thoughts and coping strategies to manage stress and anxiety in the next 2 weeks or so. This homework will  be reviewed with [name] at the next visit.  3. During the next session check in on graduation, check in on anxiety and depression, start talking about steps after high school.     [name], [name]   Individual Treatment Plan  - please see the notes from 04/17/2021 & 05/01/2021 for complete treatment plan information.  Problem/Need: Sarah Shah has a history of anxiety and mood concerns - with some ongoing challenges in these areas  Long-Term Goal #1: Oneida will be able to identify and address any mood or anxiety challenges and will maintain a low level of anxiety and depression symptoms as evidenced by self-report. Short-Term Objectives: Objective 1A: Sarah Shah will be able to rate her mood and anxiety  Objective 1B: Sarah Shah will be able to identify any negative thinking patterns that are perpetuating anxiety or depressive symptoms and replace these with more adaptive thoughts.  Objective 1C: Sarah Shah will be able to use appropriate coping strategies including helpful thoughts to maintain a low level of anxiety and depressive symptoms. Interventions: Cognitive Behavioral Therapy, Psychologist, occupational, Motivational Interviewing, and Psycho-education/Bibliotherapy  coping skills and other evidenced-based practices will be used to promote progress towards healthy functioning and to help manage decrease symptoms associated with their diagnosis.  Treatment Regimen: Individual skill building sessions every 2-4 weeks to address treatment goal/objective Target Date: 04/2022 Responsible Party: therapist and patient Person delivering treatment: Licensed Psychologist [name], [name] will support the patient's ability to achieve the goals identified. Resolved:  No  Problem/Need: Life transition - Sarah Shah is in the process of completing a major life transition including graduating from high school, starting college, and potentially working. Long-Term Goal #2: Sarah Shah will successfully navigate these life transitions  including being able to manage her stress level. Short-Term Objectives: Objective 2A: Lindaann will be able to identify stressors associated with the life transitions  Objective 2B: Melissia will be able to identify strategies to help navigate stressors associated with the life transitions and implement them  Interventions: Assertiveness/Communication, Psychologist, occupational, and Psycho-education/Bibliotherapy , and other evidenced-based practices will be used to promote progress towards healthy functioning and to help manage decrease symptoms associated with their diagnosis.  Treatment Regimen: Individual skill building sessions every 2-4 weeks to address treatment goal/objective Target Date: 04/2022 Responsible Party: therapist and patient Person delivering treatment: Licensed Psychologist [name], [name] will support the patient's ability to achieve the goals identified. Resolved:  No  Problem/Need: Life skills - Santiaga needs to develop some additional skills to facilitate independent living. Long-Term Goal #3: Shakoya will identify the additional life skills necessary to facilitate independent living. Short-Term Objectives: Objective 3A: Ceili will be able to identify areas that she needs to address to increase the likelihood of her being able to successfully live independently.   Objective 3B: Iverna will be able to create a plan to address weaknesses described in objective 3A.   Objective 3C: Jatasia will be able to implement steps on the plan developed in objective 3B. Interventions: Motivational Interviewing, Solution-Oriented/Positive Psychology, and Psycho-education/Bibliotherapy  daily life skills, and other evidenced-based practices will be used to promote progress towards healthy functioning and to help manage decrease symptoms associated with their diagnosis.  Treatment Regimen: Individual skill building  sessions every 2-4 weeks to address treatment goal/objective Target Date:  04/2022 Responsible Party: therapist and patient Person delivering treatment: Licensed Psychologist Ronnie DerbyEileen Leuthe, [name] will support the patient's ability to achieve the goals identified. Resolved:  No   Ronnie DerbyEileen Leuthe, [name]

## 2021-06-17 ENCOUNTER — Ambulatory Visit (INDEPENDENT_AMBULATORY_CARE_PROVIDER_SITE_OTHER): Admitting: Clinical

## 2021-06-17 DIAGNOSIS — F840 Autistic disorder: Secondary | ICD-10-CM

## 2021-06-17 DIAGNOSIS — F4320 Adjustment disorder, unspecified: Secondary | ICD-10-CM

## 2021-06-17 NOTE — Progress Notes (Signed)
[place] Counselor/Therapist Progress Note  Patient ID: Jameliah Cimaglia, MRN: [id number]    Date: 06/17/21  Time Spent: 10:04 am - 10:52 am: 77 Minutes  Type of Service Provided Individual Therapy  Type of Contact in-person Location: office  Mental Status Exam: Appearance:  Casual     Behavior: Appropriate  Motor: Restlestness (slight) and some fidgeting   Speech/Language:  Normal Rate  Affect: Appropriate, though a bit flat at times Larine showed periods of happiness   Mood: normal  Thought process: normal  Thought content:   WNL  Sensory/Perceptual disturbances:   WNL  Orientation: oriented to person, place, and situation  Attention: Fair  Concentration: Fair  Memory: WNL  Fund of knowledge:  Fair  Insight:   Fair  Judgment:  Fair  Impulse Control: Good   Risk Assessment: No apparent indicators of SI or HI during the visit  Presenting Problems, Reported Symptoms, and /or Interim History: Milen presented to a session to discuss graduation, life transitions, and any residual stress or mood challenges.  Subjective: Keairah presented for an individual outpatient therapy session. The following was addressed during sessions.   Eldra reported that her mood and anxiety were substantially better than during the previous session.  She reported that she felt good after graduating and felt that much of her stress was able to be put behind her after finishing school.  She is in the process of applying for a job.  She indicated a stressor for her currently is not having a routine or schedule to follow now that she is graduated.  Strategies for developing her own general schedule for the day were discussed along with making a sample schedule for the summer during the session.  Mirajane indicated that she did not want to create written schedules at home because she can get too fixated on them and then feels the need to follow them exactly.  Therefore, a more general schedule was  developed.  Anxiety she is experiencing about going to school in the fall were discussed along with strategies that Devra may make use of during the summer to help her prepare (including touring the campus, meeting with an Risk analyst, and meeting with disability resources).  Long-term plans were discussed.  The idea that Chemika's emotions may be variable in this time of transition was discussed along with clues that she can use to help her determine how she is feeling, coping strategies she can implement as needed, and strategies to determine if she is staying in a negative mood for more than a day or 2 at a time.  Jonna seems to benefit from taking care of things (animals and plants) and incorporating this into her daily life and future was discussed.  Interventions/Psychotherapy Techniques Used During Session: Cognitive Behavioral Therapy and long term planning  Diagnosis: Adjustment disorder, unspecified type  Autism spectrum disorder  MENTAL HEALTH INTERVENTIONS USED DURING TREATMENT & PATIENT'S RESPONSE TO INTERVENTIONS:  Short-term Objective addressed today: Manika will be able to identify strategies to help navigate stressors associated with the life transitions and implement them  Mental health techniques used: Objective was addressed in session through the use of Cognitive Behavioral Therapy, discussion, and making/reviewing lists. Avila's response was generally positive, though slightly mixed about certain strategies discussed Progress Toward Goal: Progressing  Short-term Objective addressed today: Shar will be able to use appropriate coping strategies including helpful thoughts to maintain a low level of anxiety and depressive symptoms. Mental health techniques used: Objective was addressed in  session through the use of Cognitive Behavioral Therapy, discussion, and reviewing skills. Bayan's response was positive though she indicated having some difficulty independently knowing how she  is feeling to be able to implement coping strategies. Progress Toward Goal: Progressing     PLAN  1. Beautifull  will return for a therapy session.   2. Homework Given:  completed a 'daily check in' regarding mood and stress and implament coping strategies if notice a pattern of negative feelings, follow a general structure/schedule for the summer, complete some activities to prepare for school next year. This homework will be reviewed with [name] at the next visit.  3. During the next session check-in on mood and anxiety, summer plans, job prospects, school next year.     [name], [name]     Individual Treatment Plan  - please see the notes from 04/17/2021 & 05/01/2021 for complete treatment plan information.  Problem/Need: Voni has a history of anxiety and mood concerns - with some ongoing challenges in these areas  Long-Term Goal #1: Shekela will be able to identify and address any mood or anxiety challenges and will maintain a low level of anxiety and depression symptoms as evidenced by self-report. Short-Term Objectives: Objective 1A: Sasheen will be able to rate her mood and anxiety  Objective 1B: Tahesha will be able to identify any negative thinking patterns that are perpetuating anxiety or depressive symptoms and replace these with more adaptive thoughts.  Objective 1C: Prianna will be able to use appropriate coping strategies including helpful thoughts to maintain a low level of anxiety and depressive symptoms. Interventions: Cognitive Behavioral Therapy, Systems analyst, Motivational Interviewing, and Psycho-education/Bibliotherapy  coping skills and other evidenced-based practices will be used to promote progress towards healthy functioning and to help manage decrease symptoms associated with their diagnosis.  Treatment Regimen: Individual skill building sessions every 2-4 weeks to address treatment goal/objective Target Date: 04/2022 Responsible Party: therapist and patient Person  delivering treatment: Licensed Psychologist [name], [name] will support the patient's ability to achieve the goals identified. Resolved:  No  Problem/Need: Life transition - Janayah is in the process of completing a major life transition including graduating from high school, starting college, and potentially working. Long-Term Goal #2: Joyceann will successfully navigate these life transitions including being able to manage her stress level. Short-Term Objectives: Objective 2A: Liddie will be able to identify stressors associated with the life transitions  Objective 2B: Alanii will be able to identify strategies to help navigate stressors associated with the life transitions and implement them  Interventions: Assertiveness/Communication, Systems analyst, and Psycho-education/Bibliotherapy , and other evidenced-based practices will be used to promote progress towards healthy functioning and to help manage decrease symptoms associated with their diagnosis.  Treatment Regimen: Individual skill building sessions every 2-4 weeks to address treatment goal/objective Target Date: 04/2022 Responsible Party: therapist and patient Person delivering treatment: Licensed Psychologist [name], [name] will support the patient's ability to achieve the goals identified. Resolved:  No  Problem/Need: Life skills - Jayna needs to develop some additional skills to facilitate independent living. Long-Term Goal #3: Vidushi will identify the additional life skills necessary to facilitate independent living. Short-Term Objectives: Objective 3A: Jmarie will be able to identify areas that she needs to address to increase the likelihood of her being able to successfully live independently.   Objective 3B: Bertia will be able to create a plan to address weaknesses described in objective 3A.   Objective 3C: Reynaldo will be able to implement steps on  the plan developed in objective 3B. Interventions: Motivational  Interviewing, Solution-Oriented/Positive Psychology, and Psycho-education/Bibliotherapy  daily life skills, and other evidenced-based practices will be used to promote progress towards healthy functioning and to help manage decrease symptoms associated with their diagnosis.  Treatment Regimen: Individual skill building sessions every 2-4 weeks to address treatment goal/objective Target Date: 04/2022 Responsible Party: therapist and patient Person delivering treatment: Licensed Psychologist [name], [name] will support the patient's ability to achieve the goals identified. Resolved:  No             [name], [name]

## 2021-06-18 ENCOUNTER — Telehealth: Payer: Self-pay | Admitting: Hematology and Oncology

## 2021-06-18 NOTE — Telephone Encounter (Signed)
Scheduled per 6/7 sch msg, message has been left with pt

## 2021-06-20 ENCOUNTER — Ambulatory Visit: Admitting: Family Medicine

## 2021-06-24 ENCOUNTER — Ambulatory Visit (INDEPENDENT_AMBULATORY_CARE_PROVIDER_SITE_OTHER): Payer: Self-pay | Admitting: Adult Health

## 2021-06-24 DIAGNOSIS — Z91199 Patient's noncompliance with other medical treatment and regimen due to unspecified reason: Secondary | ICD-10-CM

## 2021-06-24 NOTE — Progress Notes (Signed)
Patient no show appointment. ? ?

## 2021-07-03 ENCOUNTER — Other Ambulatory Visit

## 2021-07-03 ENCOUNTER — Ambulatory Visit: Admitting: Hematology and Oncology

## 2021-07-10 ENCOUNTER — Ambulatory Visit (INDEPENDENT_AMBULATORY_CARE_PROVIDER_SITE_OTHER): Payer: Medicaid Other | Admitting: Clinical

## 2021-07-10 ENCOUNTER — Encounter: Payer: Self-pay | Admitting: Adult Health

## 2021-07-10 ENCOUNTER — Ambulatory Visit (INDEPENDENT_AMBULATORY_CARE_PROVIDER_SITE_OTHER): Admitting: Adult Health

## 2021-07-10 VITALS — BP 115/67 | HR 88 | Ht 63.0 in | Wt 290.0 lb

## 2021-07-10 DIAGNOSIS — F902 Attention-deficit hyperactivity disorder, combined type: Secondary | ICD-10-CM

## 2021-07-10 DIAGNOSIS — F4320 Adjustment disorder, unspecified: Secondary | ICD-10-CM

## 2021-07-10 DIAGNOSIS — F840 Autistic disorder: Secondary | ICD-10-CM

## 2021-07-10 DIAGNOSIS — F3341 Major depressive disorder, recurrent, in partial remission: Secondary | ICD-10-CM | POA: Diagnosis not present

## 2021-07-10 DIAGNOSIS — F913 Oppositional defiant disorder: Secondary | ICD-10-CM | POA: Diagnosis not present

## 2021-07-10 NOTE — Progress Notes (Signed)
[facility] MD/PA/NP Initial Note  07/10/2021 9:17 AM [name]  MRN:  [id number]  Chief Complaint:   HPI:   Patient seen today for initial psychiatric evaluation.  Referred by PCP.  Accompanied by mother. Mother contributing to interview - history.  Diagnosed with ODD, ADHD, learning disabilities, Autistic - currently testing on a 2nd grade level.  Medical - PCOS, insulin resistance chronic fatigue, iron deficiency.  Describes mood today as "ok". Pleasant. Flat. Mood symptoms - denies depression and anxiety. Feels irritability "sometimes" - more so around otherpeople - does well with animals. Denies worry and rumination. Mood fluctuates - gets overwhelmed. Reports occasional outbursts. Stating "I feel like I'm doing ok right now". Feels like medication are helpful. Mother feels like she does well for the most part. Stable interest and motivation. Taking medications as prescribed.  Energy levels lower. More active than she was a year ago. Feels tired and fatigued a lot of the time. Active, does not have a regular exercise routine. Enjoys some usual interests and activities. Spending time with family. Has one sister - graduated from [employer]. Has an Aussie doodle "Ruby" - "her best friend". Loves animals - has 4 [place] Pigs, ducks, chickens and a cow. 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 Appetite adequate.  Weight loss over the past year - consistent loss over past year.  Sleeps well most nights. Has a nightly routine. Averages 7 to 8 hours. Focus and concentration stable with medication. Completing tasks. Managing aspects of household. Recently graduated from high school - [employer]. Planning to attend [employer] for animal science - visited campus recently. Denies SI or HI.  Denies AH or VH. Denies self harm. Denies substance use.  Seeing therapist - Dr. Raenette Rover Psychologist - [facility].  Previous medication trials:  Vyvanse, Ritalin, Daytrana patch.  Visit Diagnosis:    ICD-10-CM    1. Attention deficit hyperactivity disorder (ADHD), combined type  F90.2     2. Recurrent major depressive disorder, in partial remission ([place])  F33.41     3. Oppositional defiant disorder  F91.3     4. Adjustment disorder, unspecified type  F43.20     5. Autism spectrum disorder  F84.0       Past Psychiatric History: Denies psychiatric hospitalization.   Past Medical History:  Past Medical History:  Diagnosis Date   ADHD (attention deficit hyperactivity disorder)    Anxiety    Autism spectrum    Constipated    Constipation    Depression    Development delay    GERD (gastroesophageal reflux disease)    Insulin resistance    Iron deficiency    Learning disability    Obesity    ODD (oppositional defiant disorder)    PCOS (polycystic ovarian syndrome)    PMDD (premenstrual dysphoric disorder)    Pneumonia    3 mos old and 10 mos old   Visual acuity reduced    glasses    Past Surgical History:  Procedure Laterality Date   BIOPSY  02/17/2021   Procedure: BIOPSY;  Surgeon: Irene Shipper, MD;  Location: [facility];  Service: Endoscopy;;   COLONOSCOPY WITH PROPOFOL N/A 02/17/2021   Procedure: COLONOSCOPY WITH PROPOFOL;  Surgeon: Irene Shipper, MD;  Location: [facility];  Service: Endoscopy;  Laterality: N/A;   ESOPHAGOGASTRODUODENOSCOPY (EGD) WITH PROPOFOL N/A 02/17/2021   Procedure: ESOPHAGOGASTRODUODENOSCOPY (EGD) WITH PROPOFOL;  Surgeon: Irene Shipper, MD;  Location: [facility];  Service: Endoscopy;  Laterality: N/A;   TOOTH EXTRACTION N/A 01/03/2018  Procedure: SURGICAL EXTRACTION OF TEETH #1, 16, 17, 32;  Surgeon: Michael Litter, DMD;  Location: [facility];  Service: Oral Surgery;  Laterality: N/A;    Family Psychiatric History: Family history of mental illness - father.  Family History:  Family History  Problem Relation Age of Onset   Diabetes Mother    Hypertension Mother    Anxiety disorder Mother    Other Mother        Premenstrual dysphoic disorder   Obesity  Mother    Stroke Mother    Colon polyps Mother    Irritable bowel syndrome Mother    Diabetes Father        type 1   Depression Father    Anxiety disorder Father    Obesity Father    Anxiety disorder Sister    ADD / ADHD Sister        ADD   Other Sister        Premenstrual dysphoric disorder   Atrial fibrillation Maternal Grandmother    Diabetes Maternal Grandmother    Heart failure Maternal Grandmother    Hypertension Maternal Grandfather    Iron deficiency Maternal Aunt    Breast cancer Maternal Aunt     Social History:  Social History   Socioeconomic History   Marital status: Single    Spouse name: Not on file   Number of children: 0   Years of education: Not on file   Highest education level: Not on file  Occupational History   Occupation: student  Tobacco Use   Smoking status: Never   Smokeless tobacco: Never  Vaping Use   Vaping Use: Never used  Substance and Sexual Activity   Alcohol use: No   Drug use: Not on file   Sexual activity: Never  Other Topics Concern   Not on file  Social History Narrative   Will start 12th grade at [employer] for the 22/23 school year.       07/20/19   Enjoys: sleep, paints, sewing (sock dolls), animal care   From: born in [place] but moved her when she was little   Who is at home: with mom Hilda Blades, stepdad Gerald Stabs, and sister [place]   Pets: loves her [place] pigs, Architectural technologist, ducks, dogs, classroom bunny   School: [employer]    Grade: 11th grade this fall      Family: good relationship with mom and family             Exercise: walking with mom    Diet: avoiding reflux worsening food      Safety   Seat belts: Yes    Guns: Yes  and secure   Safe in relationships: Yes    Helmets: Yes    Smoke Exposure at home: No   Bullying: Yes  and knows who she can ask for help and feels   Social Determinants of Health   Financial Resource Strain: Not on file  Food Insecurity: No Food Insecurity (07/03/2020)    Hunger Vital Sign    Worried About Running Out of Food in the Last Year: Never true    Ran Out of Food in the Last Year: Never true  Transportation Needs: Not on file  Physical Activity: Not on file  Stress: Not on file  Social Connections: Not on file    Allergies: No Known Allergies  Metabolic Disorder Labs: Lab Results  Component Value Date   HGBA1C 4.9 04/16/2021   MPG 100 07/07/2017   MPG 103  11/17/2016   Lab Results  Component Value Date   PROLACTIN 9.2 09/12/2018   PROLACTIN 10.4 07/07/2017   Lab Results  Component Value Date   CHOL 164 07/18/2019   TRIG 150 (H) 07/18/2019   HDL 52 07/18/2019   LDLCALC 86 07/18/2019   LDLCALC 81 03/01/2019   Lab Results  Component Value Date   TSH 3.510 04/16/2021   TSH 2.470 03/01/2019    Therapeutic Level Labs: No results found for: "LITHIUM" No results found for: "VALPROATE" No results found for: "CBMZ"  Current Medications: Current Outpatient Medications  Medication Sig Dispense Refill   acetaminophen (TYLENOL) 500 MG tablet Take 1,000 mg by mouth every 8 (eight) hours as needed for moderate pain.     Amphetamine Sulfate (EVEKEO) 10 MG TABS Take 2 tabs in AM, and 2 tabs at lunch. May take 1 tab at 4 PM PRN 150 tablet 0   blood glucose meter kit and supplies KIT Dispense based on patient and insurance preference. Use daily as directed. 1 each 0   busPIRone (BUSPAR) 10 MG tablet 20 mg in the morning, 20 mg at lunch, 10-20 mg at 4p as needed, and can take up to 60 mg total daily 540 tablet 1   cloNIDine HCl (KAPVAY) 0.1 MG TB12 ER tablet Take 2 tablets (0.2 mg total) by mouth 2 (two) times daily. Take 2 tablets by mouth 2 times daily (Patient not taking: Reported on 05/28/2021) 360 tablet 1   desogestrel-ethinyl estradiol (VIORELE) 0.15-0.02/0.01 MG (21/5) tablet Take 1 tablet by mouth daily. Please give active pills only. Skip placebo pills. 140 tablet 3   dicyclomine (BENTYL) 20 MG tablet Take 1 tablet (20 mg total) by  mouth every 6 (six) hours. (Patient not taking: Reported on 05/28/2021) 60 tablet 0   docusate sodium (COLACE) 100 MG capsule Take 100 mg by mouth at bedtime.     ferrous sulfate 325 (65 FE) MG tablet Take 325 mg by mouth at bedtime.     FLUoxetine (PROZAC) 40 MG capsule Take 1 capsule (40 mg total) by mouth daily. TAKE 1 CAPSULE(40 MG) BY MOUTH DAILY 90 capsule 3   MOUNJARO 7.5 MG/0.5ML Pen SMARTSIG:7.5 Milligram(s) SUB-Q Once a Week     omeprazole (PRILOSEC) 40 MG capsule Take 1 capsule (40 mg total) by mouth daily. 90 capsule 2   ondansetron (ZOFRAN ODT) 4 MG disintegrating tablet Take 1 tablet (4 mg total) by mouth every 8 (eight) hours as needed for nausea or vomiting. 20 tablet 0   Probiotic Product (PROBIOTIC DAILY PO) Take 1 capsule by mouth daily.     tirzepatide [facility]) 5 MG/0.5ML Pen Inject 5 mg into the skin once a week. (Patient taking differently: Inject 10 mg into the skin once a week.) 6 mL 3   Topiramate ER (TROKENDI XR) 50 MG CP24 Take 1 capsule by mouth daily. 30 capsule 1   vitamin C (ASCORBIC ACID) 500 MG tablet Take 500 mg by mouth at bedtime.     No current facility-administered medications for this visit.    Medication Side Effects: none  Orders placed this visit:  No orders of the defined types were placed in this encounter.   Psychiatric Specialty Exam:  Review of Systems  Musculoskeletal:  Negative for gait problem.  Neurological:  Negative for tremors.  Psychiatric/Behavioral:         Please refer to HPI    Blood pressure 115/67, pulse 88, height '5\' 3"'  (1.6 m), weight 290 lb (131.5 kg).Body mass  index is 51.37 kg/m.  General Appearance: Casual and Neat  Eye Contact:  Good  Speech:  Clear and Coherent and Normal Rate  Volume:  Normal  Mood:  Euthymic  Affect:  Appropriate and Congruent  Thought Process:  Coherent and Descriptions of Associations: Intact  Orientation:  Full (Time, Place, and Person)  Thought Content: Logical   Suicidal Thoughts:  No   Homicidal Thoughts:  No  Memory:  WNL  Judgement:  Fair  Insight:  Fair  Psychomotor Activity:  Normal  Concentration:  Concentration: Fair and Attention Span: Fair  Recall:  [employer] of Knowledge: Fair  Language: Good  Assets:  Communication Skills Desire for Improvement Financial Resources/Insurance Housing Intimacy Leisure Time Physical Health Resilience Social Support Talents/Skills Transportation Vocational/Educational  ADL's:  Intact  Cognition: WNL  Prognosis:  Good   Screenings:  GAD-7    Physiological scientist Office Visit from 03/03/2021 in [place] at [facility] Visit from 07/20/2019 in [place] at [facility]  Total GAD-7 Score 7 1      PHQ2-9    [place] Office Visit from 03/03/2021 in [place] at [facility] Video Visit from 10/28/2020 in [facility] from [facility]/2020 [facility] Office Visit from 07/20/2019 in [place] at [facility] Visit from 03/01/2019 in [place]  PHQ-2 Total Score 4 0 0 2 6  PHQ-9 Total Score 10 -- -- 8 19      Flowsheet Row Admission (Discharged) from 02/17/2021 in [facility] ED from 12/29/2020 in [facility] ED from 12/10/2020 in [place] No Risk No Risk No Risk       Receiving Psychotherapy: Yes   Treatment Plan/Recommendations:   Plan:  PDMP reviewed  Prozac 23m daily Kapvay 0.176m2 BID Buspar 1068m 2 - 3 times a day Evekeo - Take 2 tabs in AM, and 2 tabs at lunch. May take 1 tab at 4 PM PRN   RTC 3 months  Patient advised to contact office with any questions, adverse effects, or acute worsening in signs and symptoms.   Discussed potential benefits, risks, and side effects of stimulants with patient to include increased heart rate, palpitations, insomnia, increased  anxiety, increased irritability, or decreased appetite.  Instructed patient to contact office if experiencing any significant tolerability issues.   RegAloha GellP

## 2021-07-10 NOTE — Progress Notes (Signed)
[facility] Counselor/Therapist Progress Note  Patient ID: Sarah Shah, MRN: [id number]    Date: 07/10/21  Time Spent: 10:10 am - 10:57 pm: 47 Minutes  Type of Service Provided Individual Therapy  Type of Contact in-person Location: office  Mental Status Exam: Appearance:  Casual and Well Groomed     Behavior: Appropriate and Sharing  Motor: Normal  Speech/Language:  Clear and Coherent  Affect: Appropriate  Mood: normal  Thought process: normal  Thought content:   WNL  Sensory/Perceptual disturbances:   WNL  Orientation: oriented to person, place, situation, and day of week  Attention: Good  Concentration: Good  Memory: WNL  Fund of knowledge:  Fair  Insight:   Fair  Judgment:  Fair  Impulse Control: Good   Risk Assessment: No apparent indicators of HI or SI during the visit   Presenting Problems, Reported Symptoms, and /or Interim History: Anupama presented for a visit to address mood, anxiety, and life changes.  Subjective: Nanetta presented for an individual outpatient therapy session. The following was addressed during sessions.   Era reported that things had been going relatively well since last session.  She described both her mood and anxiety as relatively neutral.  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  She expressed distress and anxiety related to this indicating that paperwork from her school suggested that she was at a second grade reading level. Zennie reported that she did not feel that this was an accurate reflection of her skills.  Strategies she could use to manage her anxiety prior to taking the community college test were discussed with Chasiti generating several options.  In addition Alean and the therapist talked about how Ishana could manage the '  worst case scenario' of her not doing as well as she wants on the test and needing to spend an extra year in training prior to starting her program.  After this discussion Lamija agreed that she could likely handle having to do this and indicated that her anxiety and stress level were reduced.  She reported some experience of mood challenges since the last visit which she related to the lack of schedule and sleep difficulties.  Sleep hygiene was discussed along with sleep hygiene techniques that may help improve Lanya's sleep.  Daytime activities including things like going outside and researching volunteer opportunities were discussed.  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  A goal was set of learning what she would need to do to be able to open that shop in the next 3 months.  Silvia reported that she is in a relationship with the person that she used to date.  She indicated feeling good about this and that she felt that they were better able to communicate.  Interventions/Psychotherapy Techniques Used During Session: Cognitive Behavioral Therapy  Diagnosis: Adjustment disorder, unspecified type  Autism spectrum disorder  MENTAL HEALTH INTERVENTIONS USED DURING TREATMENT & PATIENT'S RESPONSE TO INTERVENTIONS:  Short-term Objective addressed today:Jadie will be able to use appropriate coping strategies including helpful thoughts to maintain a low level of anxiety and depressive symptoms. Mental health techniques used: Objective was addressed in session through the use of Cognitive Behavioral Therapy and discussion. Malaiah's  response was positive.  Progress Toward Goal: Progressing -in session today she was able to generate a number of strategies to help manage her feelings of anxiety as well as any down mood she has been experiencing recently  Short-term Objective addressed today:Fe will be able to identify strategies to help navigate  stressors associated with the life transitions and implement them  Mental health techniques used: Objective was addressed in session through the use of  Cognitive Behavioral Therapy and discussion. Yohanna's response was positive.  Progress Toward Goal: Progressing     PLAN  1. Audreana  will return for a therapy session.   2. 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 This homework will be reviewed with [name] at the next visit.  3. During the next session check-in on readiness for school next year in results of her community college exam, check in on mood and anxiety.     [name], [name]    Individual Treatment Plan  - please see the notes from 04/17/2021 & 05/01/2021 for complete treatment plan information.  Problem/Need: Jovanni has a history of anxiety and mood concerns - with some ongoing challenges in these areas  Long-Term Goal #1: Ailin will be able to identify and address any mood or anxiety challenges and will maintain a low level of anxiety and depression symptoms as evidenced by self-report. Short-Term Objectives: Objective 1A: Navaya will be able to rate her mood and anxiety  Objective 1B: Theadora will be able to identify any negative thinking patterns that are perpetuating anxiety or depressive symptoms and replace these with more adaptive thoughts.  Objective 1C: Angeleena will be able to use appropriate coping strategies including helpful thoughts to maintain a low level of anxiety and depressive symptoms. Interventions: Cognitive Behavioral Therapy, Psychologist, occupational, Motivational Interviewing, and Psycho-education/Bibliotherapy  coping skills and other evidenced-based practices will be used to promote progress towards healthy functioning and to help manage decrease symptoms associated with their diagnosis.  Treatment  Regimen: Individual skill building sessions every 2-4 weeks to address treatment goal/objective Target Date: 04/2022 Responsible Party: therapist and patient Person delivering treatment: Licensed Psychologist [name], [name] will support the patient's ability to achieve the goals identified. Resolved:  No  Problem/Need: Life transition - Keshanna is in the process of completing a major life transition including graduating from high school, starting college, and potentially working. Long-Term Goal #2: Yazmen will successfully navigate these life transitions including being able to manage her stress level. Short-Term Objectives: Objective 2A: Dashawna will be able to identify stressors associated with the life transitions  Objective 2B: Charnika will be able to identify strategies to help navigate stressors associated with the life transitions and implement them  Interventions: Assertiveness/Communication, Psychologist, occupational, and Psycho-education/Bibliotherapy , and other evidenced-based practices will be used to promote progress towards healthy functioning and to help manage decrease symptoms associated with their diagnosis.  Treatment Regimen: Individual skill building sessions every 2-4 weeks to address treatment goal/objective Target Date: 04/2022 Responsible Party: therapist and patient Person delivering treatment: Licensed Psychologist [name], [name] will support the patient's ability to achieve the goals identified. Resolved:  No  Problem/Need: Life skills - Britta needs to develop some additional skills to facilitate independent living. Long-Term Goal #3: Casandra will identify the additional life skills necessary to facilitate independent living. Short-Term Objectives: Objective 3A: Ashritha will be able to  identify areas that she needs to address to increase the likelihood of her being able to successfully live independently.   Objective 3B: [name] will be able to create a plan to address  weaknesses described in objective 3A.   Objective 3C: Kishia will be able to implement steps on the plan developed in objective 3B. Interventions: Motivational Interviewing, Solution-Oriented/Positive Psychology, and Psycho-education/Bibliotherapy  daily life skills, and other evidenced-based practices will be used to promote progress towards healthy functioning and to help manage decrease symptoms associated with their diagnosis.  Treatment Regimen: Individual skill building sessions every 2-4 weeks to address treatment goal/objective Target Date: 04/2022 Responsible Party: therapist and patient Person delivering treatment: Licensed Psychologist [name], [name] will support the patient's ability to achieve the goals identified. Resolved:  No   [name], [name]

## 2021-07-17 ENCOUNTER — Other Ambulatory Visit: Payer: Self-pay | Admitting: Family Medicine

## 2021-07-17 ENCOUNTER — Encounter: Payer: Self-pay | Admitting: Family Medicine

## 2021-07-17 DIAGNOSIS — F902 Attention-deficit hyperactivity disorder, combined type: Secondary | ICD-10-CM

## 2021-07-18 MED ORDER — AMPHETAMINE SULFATE 10 MG PO TABS: ORAL_TABLET | ORAL | 0 refills | Status: DC

## 2021-07-18 NOTE — Telephone Encounter (Signed)
Refill request Amphetamine Last office visit 03/03/21 Last refill 03/31/21 #150 No upcoming appointment scheduled Patient has cancelled several appointments See note from pharmacy only has enough thru Saturday morning

## 2021-07-18 NOTE — Telephone Encounter (Signed)
Mayra Reel NP Refill request was sent to you earlier. Please let patient know about the refill request.

## 2021-07-18 NOTE — Telephone Encounter (Signed)
No suspicious activity noted on PMP aware website. Reviewed office notes from Dr. Selena Batten from February 2023. Notified patient that she will need to start requesting refills from her psychiatrist.  Refill provided today.

## 2021-07-18 NOTE — Telephone Encounter (Signed)
Pt called with her mom on the phone Stanton Kidney) about this medication refill below, she stated she has enough to last her for this afternoon and tomorrow morning, they forgot to call in a refill request. Please return a call back when possible to speak about this.  Callback #: [phone]

## 2021-07-22 ENCOUNTER — Telehealth: Payer: Self-pay | Admitting: Family

## 2021-07-22 NOTE — Telephone Encounter (Signed)
    Faxed all records to [facility]. 

## 2021-07-25 ENCOUNTER — Other Ambulatory Visit: Payer: Self-pay

## 2021-07-25 ENCOUNTER — Telehealth: Payer: Self-pay | Admitting: Adult Health

## 2021-07-25 NOTE — Telephone Encounter (Signed)
Yes, check with prescriber - we can pick this up if needed.

## 2021-07-25 NOTE — Telephone Encounter (Signed)
Next visit is 10/10/21. Venia Minks, Spenser's mom called. She is listed on the [facility] She says that Taygen's PCP called in [place] on 07/18/21 and called in only 25 pills. She is requesting another refill. Pharmacy is:  [id number] [place], [place] - [address] AT [facility] [address]  Phone:  [phone]  Fax:  [phone]

## 2021-07-25 NOTE — Telephone Encounter (Signed)
Did see message in chart from PCP that they want [place] to pick up stimulant refills, which we will do going forward. Mom notified.

## 2021-07-25 NOTE — Telephone Encounter (Signed)
See message. Rx was written correctly, but the pharmacy did not have enough to fill it. We have not prescribed her stimulant medication in the past. I know she is a new patient for [place].  Are we going to start prescribing her stimulant? Since PCP wrote the Rx in question should they contact her to fix this situation.

## 2021-07-28 ENCOUNTER — Other Ambulatory Visit: Payer: Self-pay

## 2021-07-28 DIAGNOSIS — F902 Attention-deficit hyperactivity disorder, combined type: Secondary | ICD-10-CM

## 2021-07-28 MED ORDER — AMPHETAMINE SULFATE 10 MG PO TABS: ORAL_TABLET | ORAL | 0 refills | Status: DC

## 2021-07-28 NOTE — Telephone Encounter (Signed)
Pt's mom, Debbie, LVM at 9:15a.  She said she was returning call to [place] about the [place].  They need a refill sent to [facility].  They don't have enough pills there, so they need a new script.

## 2021-07-28 NOTE — Telephone Encounter (Signed)
Pended.

## 2021-08-12 ENCOUNTER — Encounter (INDEPENDENT_AMBULATORY_CARE_PROVIDER_SITE_OTHER): Payer: Self-pay | Admitting: Pediatric Endocrinology

## 2021-08-12 ENCOUNTER — Ambulatory Visit (INDEPENDENT_AMBULATORY_CARE_PROVIDER_SITE_OTHER): Admitting: Clinical

## 2021-08-12 ENCOUNTER — Encounter: Payer: Self-pay | Admitting: Physician Assistant

## 2021-08-12 ENCOUNTER — Encounter: Payer: Self-pay | Admitting: Family Medicine

## 2021-08-12 DIAGNOSIS — F4320 Adjustment disorder, unspecified: Secondary | ICD-10-CM | POA: Diagnosis not present

## 2021-08-12 DIAGNOSIS — F840 Autistic disorder: Secondary | ICD-10-CM

## 2021-08-12 NOTE — Progress Notes (Signed)
[facility] Counselor/Therapist Progress Note  Patient ID: Sarah Shah, MRN: [id number]    Date: 08/12/21  Time Spent: 2:18 pm - 3:00 pm: 42 Minutes  Type of Service Provided Individual Therapy  Type of Contact in-person Location: office  Mental Status Exam: Appearance:  Fairly Groomed     Behavior: Appropriate and Sharing  Motor: Normal  Speech/Language:  Normal Rate  Affect: Appropriate but a but flat  Mood: normal  Thought process: normal  Thought content:   WNL  Sensory/Perceptual disturbances:   WNL  Orientation: oriented to person, place, time/date, and situation  Attention: Fair  Concentration: Good  Memory: WNL  Fund of knowledge:  Fair  Insight:   Fair  Judgment:  Fair  Impulse Control: Good   Risk Assessment: No apparent indicators of HI or SI during the visit   Presenting Problems, Reported Symptoms, and /or Interim History: Angeliz presented to a session to discuss anxiety and some challenges at home  Subjective: Eilleen presented for an individual outpatient therapy session. The following was addressed during sessions.   Peyson reported that things had been going okay and that her mood and anxiety were "okay".  She reported that her sister moved back into the family home.  She has experienced some stress and anxiety related to this change as well as some challenges with her sister not cleaning up the way that Rafaelita is used to.  Some of these concerns were related to the idea that someone would come into the home and feel that she was the one who was causing things to be less neat.  This type of thinking was examined and challenged with Leola replacing it with a more realistic thought.  She reported that this helped decrease her overall level of stress and anxiety related to interactions with her sister.  Ways that she could effectively communicate with her sister were also discussed.  Interventions/Psychotherapy Techniques Used During Session:  Cognitive Behavioral Therapy, Assertiveness/Communication, and Psychologist, occupational  Diagnosis: Autism spectrum disorder  Adjustment disorder, unspecified type  MENTAL HEALTH INTERVENTIONS USED DURING TREATMENT & PATIENT'S RESPONSE TO INTERVENTIONS:  Short-term Objective addressed today:Aarion will be able to use appropriate coping strategies including helpful thoughts to maintain a low level of anxiety and depressive symptoms. Mental health techniques used: Objective was addressed in session through the use of  Cognitive Behavioral Therapy and Assertiveness/Communication and discussion. Aster's response was positive.  Progress Toward Goal: Progressing  PLAN  1. Zanetta  will return for a therapy session.   2. Homework Given: Try assertive but kind communication strategies with her sister, continue to research what she needs to start in an [place] shop and animal volunteering opportunities.. This homework will be reviewed with [name] at the next visit.  3. During the next session check in on mood and anxiety, discussed next steps in school.     [name], [name]    Individual Treatment Plan  - please see the notes from 04/17/2021 & 05/01/2021 for complete treatment plan information.  Problem/Need: Deandrea has a history of anxiety and mood concerns - with some ongoing challenges in these areas  Long-Term Goal #1: Avry will be able to identify and address any mood or anxiety challenges and will maintain a low level of anxiety and depression symptoms as evidenced by self-report. Short-Term Objectives: Objective 1A: [name] will be able to rate her mood and anxiety  Objective 1B: Nazirah will be able to identify any negative thinking patterns that are perpetuating anxiety or  depressive symptoms and replace these with more adaptive thoughts.  Objective 1C: Jennfier will be able to use appropriate coping strategies including helpful thoughts to maintain a low level of anxiety and depressive  symptoms. Interventions: Cognitive Behavioral Therapy, Psychologist, occupational, Motivational Interviewing, and Psycho-education/Bibliotherapy  coping skills and other evidenced-based practices will be used to promote progress towards healthy functioning and to help manage decrease symptoms associated with their diagnosis.  Treatment Regimen: Individual skill building sessions every 2-4 weeks to address treatment goal/objective Target Date: 04/2022 Responsible Party: therapist and patient Person delivering treatment: Licensed Psychologist [name], [name] will support the patient's ability to achieve the goals identified. Resolved:  No  Problem/Need: Life transition - Mikayah is in the process of completing a major life transition including graduating from high school, starting college, and potentially working. Long-Term Goal #2: Lakeyta will successfully navigate these life transitions including being able to manage her stress level. Short-Term Objectives: Objective 2A: Nicholette will be able to identify stressors associated with the life transitions  Objective 2B: Jahnay will be able to identify strategies to help navigate stressors associated with the life transitions and implement them  Interventions: Assertiveness/Communication, Psychologist, occupational, and Psycho-education/Bibliotherapy , and other evidenced-based practices will be used to promote progress towards healthy functioning and to help manage decrease symptoms associated with their diagnosis.  Treatment Regimen: Individual skill building sessions every 2-4 weeks to address treatment goal/objective Target Date: 04/2022 Responsible Party: therapist and patient Person delivering treatment: Licensed Psychologist [name], [name] will support the patient's ability to achieve the goals identified. Resolved:  No  Problem/Need: Life skills - Sarra needs to develop some additional skills to facilitate independent living. Long-Term Goal #3: Aubreigh  will identify the additional life skills necessary to facilitate independent living. Short-Term Objectives: Objective 3A: Maryjane will be able to identify areas that she needs to address to increase the likelihood of her being able to successfully live independently.   Objective 3B: Ruqayya will be able to create a plan to address weaknesses described in objective 3A.   Objective 3C: Abagayle will be able to implement steps on the plan developed in objective 3B. Interventions: Motivational Interviewing, Solution-Oriented/Positive Psychology, and Psycho-education/Bibliotherapy  daily life skills, and other evidenced-based practices will be used to promote progress towards healthy functioning and to help manage decrease symptoms associated with their diagnosis.  Treatment Regimen: Individual skill building sessions every 2-4 weeks to address treatment goal/objective Target Date: 04/2022 Responsible Party: therapist and patient Person delivering treatment: Licensed Psychologist [name], [name] will support the patient's ability to achieve the goals identified. Resolved:  No  [name], [name]

## 2021-08-13 ENCOUNTER — Other Ambulatory Visit: Payer: Self-pay

## 2021-08-13 NOTE — Telephone Encounter (Signed)
Medications are already listed in pt's chart.

## 2021-08-14 ENCOUNTER — Ambulatory Visit (INDEPENDENT_AMBULATORY_CARE_PROVIDER_SITE_OTHER): Admitting: Clinical

## 2021-08-14 DIAGNOSIS — F840 Autistic disorder: Secondary | ICD-10-CM

## 2021-08-14 NOTE — Progress Notes (Signed)
Time: 10:00am-11:00am CPT Code: 31540G-86 Diagnosis Code: F84.0  Rosaura and her mother were seen in person to an initiate a re-evaluation for ASD. Following verbal review of consent forms, examiner engaged Emmanuela and her mother in a review of medical, educational, and family background, as well as a developmental interview based on the ADI-R to document changes and progress since her initial evaluation in 2018. She is scheduled for cognitive and behavioral testing on 9/8.  Previous Diagnoses: Teddi has previously been diagnosed with AD/HD, Oppositional Defiant Disorder, Developmental Delay, Autism Spectrum Disorder, dysgraphia, and anxiety.  Medical History: Sahily's mother reported that she experienced high blood pressure during her pregnancy with Shiza, but the pregnancy was reportedly otherwise healthy. Loye was delivered via scheduled C-section due to her suspected large size at birth at [redacted] weeks gestation. She was able to go home from the hospital within a typical timeframe. At 78 months of age, Maecyn was placed on medication for reflux. At 81 months of age, she was hospitalized briefly for RSV and pneumonia. At 63 months old, Ragan was hospitalized again with pneumonia. Ariaunna has experienced frequent constipation since childhood that requires the regular use of a stool softener. She was diagnosed with PCOS and insulin resistance in November of 2017. Makina is seen at [facility]ily[facility] at [facility] to manage her anemia. She has had 5 iron infusions since fall of 2018. She suffered significant lethargy and low energy throughout 10th and 11th grade due to iron deficiencies. Rendi fractured her ankle in September 2021. Madason suffered COVID October of 2022, and reported that she experienced symptoms of long COVID, including breathing difficulties that resulted in a visit to the ER to obtain and inhaler in October of 2022. In April of 2023 she tested positive again, but had no symptoms and this is believed to  have been a false positive. Idil also reports chronic pain on her right side that is undiagnosed despite Rayvn having undergone a colonoscopy and endoscopy. At follow up, Nariya was prescribed: Evekeo 45m, 5 of them daily, Klonidin, 0.116m 2 of them twice daily, buspar 1032m2 of them twice daily and 2 additional as needed, Vitamin C 500m52mron 325mg74muoxetine 40mg 94mitional 10 mg as needed), Topamax 50 mg, phentermine 15mg, 28mrozole 40mg. E28m's mother described her childhood as otherwise healthy. Gitty waAubryanan by Dr. Eileen LZara Chessividual therapy at time of follow up.  Family History: Paternal family history is significant for anxiety, depression, learning disabilities, PCOS, and Type 1 diabetes. Maternal family history is significant for Type 2 diabetes, anxiety, high blood pressure, PMDD, and AD/HD. Nanna's parents divorced in 2007, when Tatanisha waHennieears 57ld. Tenley's mother remarried in April of 2015. Veona spConcepcionthe majority of her time with her mother, 22 year-40d sister, and 13 year-63d brother, as well as some holidays with her biological father. Alannie liKirandeepith her mother, stepfather, and older sister at time of follow-up. Her sister moved out to attend college but moved back into the home in May of 2023.  Educational History: Jisele beNeveenttending daycare from 7 weeks to 3 years 71f age and was reported to have done well in this setting. At 4 years 62f age, Liadan trNadirarred to a new daycare and was reported to have begun demonstrating behavioral difficulties. She transferred daycare programs again at the age of 5, when 42he family moved. Danika trAizzarred daycare programs again after just a few months due to difficulty keeping up with curriculum. She underwent psychological testing  at the age of 58, and received diagnoses of developmental delay, learning disability, and AD/HD. Saphia began attending kindergarten one month before her 43th birthday. She changed schools halfway through the  year when the family moved, and repeated kindergarten the following year. Demetrica then remained at [employer] through 5th grade, where she had an IEP for speech, occupational therapy, reading, and math. In 2010, she underwent a full evaluation by the [facility], where she was diagnosed with Oppositional Defiant Disorder. Dollene then began 6th grade at the [employer] in [place]. Aylin continued to receive services through an IEP, and was placed in small classrooms where she was permitted to work at her own grade level (for example, [name] was provided with 4th grade coursework during her 6th grade year). Her mother reported that she experienced challenges with discipline and getting along with peers during her 6th grade year. Tamico repeated her 7th grade year due to slow academic progress. 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 She was reported to experience difficulty during testing. Additionally, her mother expressed concern that she has not been directly taught in the setting due to classmates functioning at a 7th and 8th grade level. Although Snigdha is in the room with her peers, she completes her coursework separately from the rest of the class, on the computer. [name] attended [employer] 9th-12th grades. School went virtual in March of 2020 due to the [place] pandemic, when Paislei was in 10th grade. Caela continued to attend virtually through the end of that school year. School resumed in-person in fall of 2020, with frequent breaks for virtual school for various reasons. School continued completely in person as of fall of 2021. Denecia graduated from high school June of 2023, and was in the process of pursuing higher education at [employer] at time of follow up  Developmental and Behavioral History: Developmental  Milestones Adamariz's mother reported that she first experienced concern regarding Gabryella's development when, at 21 years old, she demonstrated significant emotional lability. Melea's gross motor milestones were also reported to have been significantly delayed, and she began walking at about 21 years of age. However, she was reported to have met language milestones largely on time. Lavere's first word was reported to have been "no." She was reported to have begun using single words within a typical timeframe. Emmarie was reported to have begun using simple phrases at 22 years of age. Toilet training was reported to have been challenging, and Vivianna continued to have accidents throughout her first and second years of kindergarten. Ethyl was reported to have achieved continence at about 21 years of age. No skill regressions were reported.   Social Affect With regard to the use of language for the purpose of reciprocal social communication, Boluwatife was reported to engage in conversations regularly. She occasionally demonstrates socially inappropriate statements, such as making comments about others while they are in within earshot, and did so when she was between the ages of 65-5. She and her mother described these comments as primarily intentional at follow up. Helina's mother described the 82 of her struggles in high school as relating to difficulty in social situations. She was not reported to engage in pronoun confusion, or to have done so between the ages of 4-5. With regard to the use of nonverbal behaviors for the purpose of communication, Robyne was not reported  to have pointed as a young child. Evlyn nodded and shook her head communicatively as a young child, but no longer does so. Overall, Ashawna's use of gestures was reported to have been limited during her early childhood. Currently, her mother reports that she occasionally uses a handful of gestures. Anastasha was not reported to have used dolls as agents or engaged  in pretend play involving a plot as a young child. Instead, Maeby was reported to have engaged in detailed Lego constructions. Vannie was reported to show a variety of objects for the purpose of sharing interest currently, and to have done so between the ages of 4-5. Aletheia currently has a reciprocal friendship with a peer she knows through her church, and her mother reports that they talk on the phone regularly. Evanie's mother indicated that she has a difficult time reciprocating friendship, and is working on her skills to communicate when she does not want to interact without hurting others' feelings.  Restricted and Repetitive Behaviors With regard to the stereotyped use of speech, Marialuiza was reported to memorize lines from shows with her sister, however, this is described as a social behavior. At follow up, Tyronda continued to memorize lines from TikToks, Memes, and broadway shows. These comments are often non-communicative in nature. Nitza was also reported to do this on her own periodically. Ashlinn was reported to demonstrate some idiosyncratic speech, and to have done so as a young child. For example, Adayah used to refer to firetrucks as "siretrucks," even after having been corrected by others. Her mother also noted that she has a large vocabulary but often doesn't know what words mean, and so often uses these words inappropriately. She appears unaware of some social cues, and frequently interrupts others. Her mother related this tendency to Tavonna's concrete thinking style. No verbal or behavioral rituals were reported presently or in the past. Myrka's mother reported that her intonation has been noted to be somewhat flat. With regard to unusually intense interests, Emmaleigh demonstrated an "all consuming" interest in [place] and [place] as a young child. Currently, she was reported to demonstrate an unusually intense interest in YouTube that interferes with her social functioning at times. She also continued to  be very interested in [place] at follow up in 2023. Doris's mother reported that she was intensely attached to a particular doll between the ages of 33-8, and demonstrated significant behavioral difficulties if she was unable to bring it with her. No repetitive use of toys or objects was reported. However, at follow up in 2023, Issa's mother described her as "meticulous" with her cleaning, and reported that Yvonnie becomes intensely focused on maintaining a clean household. She was also reported to demonstrate sensory aversions to the feelings of buttons, belts, jeans, and underwire on bras. Lacora has been a picky eater with a restrictive diet since her early childhood. At follow up in 2023, Turquoise had expanded her diet significantly, but her mother described her as routine oriented in her eating habits, and she continued to demonstrate sensory aversions to the smells of certain foods. In terms of sensory interests, Leean's mother reported that she prefers to be wrapped in a blanket, to the point of trying to take blankets with her even if to do so is inappropriate to the situation. Eldora continued to do this throughout high school, however, she no longer brought the blanket out in public with her as of follow up in 2023.  Luther was reported to struggle with changes in routine. Her mother noted  that she suspects Asuncion's defiant behavior emerged when she was a young child due to her difficulty with changes in routine. For example, Verda demonstrated difficult behavior any time she was not able to return home immediately after school. She continues to struggle with changes in routine currently. No hand or body mannerisms were endorsed currently or at any point in the past.                Myrtie Cruise, [name]

## 2021-08-20 ENCOUNTER — Encounter (INDEPENDENT_AMBULATORY_CARE_PROVIDER_SITE_OTHER): Payer: Self-pay

## 2021-09-03 ENCOUNTER — Encounter: Payer: Self-pay | Admitting: Family Medicine

## 2021-09-04 ENCOUNTER — Ambulatory Visit: Admitting: Clinical

## 2021-09-09 ENCOUNTER — Ambulatory Visit (INDEPENDENT_AMBULATORY_CARE_PROVIDER_SITE_OTHER): Admitting: Clinical

## 2021-09-09 DIAGNOSIS — F840 Autistic disorder: Secondary | ICD-10-CM | POA: Diagnosis not present

## 2021-09-09 DIAGNOSIS — F3341 Major depressive disorder, recurrent, in partial remission: Secondary | ICD-10-CM | POA: Diagnosis not present

## 2021-09-09 NOTE — Progress Notes (Signed)
[facility] Counselor/Therapist Progress Note  Patient ID: Sarah Shah, MRN: [id number]    Date: 09/09/21  Time Spent: 2:57 pm - 3:55 pm: 58 Minutes  Type of Service Provided Individual Therapy  Type of Contact in-person  Location: office   Mental Status Exam: Appearance:  Casual and Fairly Groomed     Behavior: Drowsy at beginning but more animated over the course of the session   Motor: Restlestness  Speech/Language:  Clear and Coherent  Affect: Appropriate to frustrated   Mood: normal  Thought process: normal  Thought content:   WNL  Sensory/Perceptual disturbances:   WNL  Orientation: oriented to person, place, time/date, and situation  Attention: Fair  Concentration: Fair  Memory: WNL  Fund of knowledge:  Fair  Insight:   Fair  Judgment:  Fair  Impulse Control: Good   Risk Assessment: Danger to Self:  No -denied SI Self-injurious Behavior: No -denied SIB Danger to Others: No Duty to Warn:no  Presenting Problems, Reported Symptoms, and /or Interim History: Omaria presented for a therapy session to address mood and anxiety concerns and life changes.  Subjective: Clark  presented for an individual outpatient therapy session. The following was addressed during sessions.   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  She reported that this is causing her stress because she is staying up very late to complete work and then having trouble sleeping.  Strategies to address sleep hygiene and help improve her sleep were discussed.  She reported that her mood has been okay.  However school is causing her anxiety.  There are times when she is trying to complete schoolwork and her anxiety is a 9 out of 10 (with 10 being a very high level of anxiety).  In general however when she is not trying to complete work her anxiety is lower.  She reported experiencing a number of  feelings over the last few weeks including a combination of sadness, anger, frustration, and feeling overwhelmed.  This led her to break up playing intentionally at one point.  However she reported feeling worse after breaking the plate.  Other more positive strategies for managing these feelings were discussed including walking or exercise, playing with her animals, visualization (a visualization was described and practiced in session), and talking to her friends.  She also reported that the 2-year anniversary of her grandmother passing away is this weekend and grief and grief management were discussed.  Interventions/Psychotherapy Techniques Used During Session: Cognitive Behavioral Therapy, Mindfulness Meditation, Solution-Oriented/Positive Psychology, and Grief Therapy  Diagnosis: Autism spectrum disorder  Recurrent major depressive disorder, in partial remission (HCC)  MENTAL HEALTH INTERVENTIONS USED DURING TREATMENT & PATIENT'S RESPONSE TO INTERVENTIONS:  Short-term Objective addressed today:Nadra will be able to identify strategies to help navigate stressors associated with the life transitions and implement them  Mental health techniques used: Objective was addressed in session through the use of Cognitive Behavioral Therapy and Solution-Oriented/Positive Psychology and discussion. Fatime's  response was enterally positive.  Progress Toward Goal: Progressing  Short-term Objective addressed today:Pebbles will be able to use appropriate coping strategies including helpful thoughts to maintain a low level of anxiety and depressive symptoms. Mental health techniques used: Objective was addressed in session through the use of Cognitive Behavioral Therapy, Mindfulness Meditation, and Grief Therapy, discussion, and teaching skills. Taquila's response was mixed -although she seems open to alternative strategies presented during session and it appears that there may  be some challenges with generalizing the  strategies outside of the session.  Progress Toward Goal: Some progress   PLAN  1. Falynn  will return for a therapy session.   2. Homework Given: Come up with a plan with her mother to celebrate and remember her grandmother this weekend, practice using other strategies to discharge negative feelings, make some adjustments to her sleep schedule and try to implement some of the sleep hygiene practices discussed. This homework will be reviewed with [name] at the next visit.  3. During the next session check in on mood, anxiety, stress, and school.     [name], [name]    Individual Treatment Plan  - please see the notes from 04/17/2021 & 05/01/2021 for complete treatment plan information.  Problem/Need: Shawntel has a history of anxiety and mood concerns - with some ongoing challenges in these areas  Long-Term Goal #1: Eri will be able to identify and address any mood or anxiety challenges and will maintain a low level of anxiety and depression symptoms as evidenced by self-report. Short-Term Objectives: Objective 1A: Khailee will be able to rate her mood and anxiety  Objective 1B: Perry will be able to identify any negative thinking patterns that are perpetuating anxiety or depressive symptoms and replace these with more adaptive thoughts.  Objective 1C: Neeva will be able to use appropriate coping strategies including helpful thoughts to maintain a low level of anxiety and depressive symptoms. Interventions: Cognitive Behavioral Therapy, Psychologist, occupational, Motivational Interviewing, and Psycho-education/Bibliotherapy  coping skills and other evidenced-based practices will be used to promote progress towards healthy functioning and to help manage decrease symptoms associated with their diagnosis.  Treatment Regimen: Individual skill building sessions every 2-4 weeks to address treatment goal/objective Target Date: 04/2022 Responsible Party: therapist and patient Person delivering treatment:  Licensed Psychologist [name], [name] will support the patient's ability to achieve the goals identified. Resolved:  No  Problem/Need: Life transition - Lenzie is in the process of completing a major life transition including graduating from high school, starting college, and potentially working. Long-Term Goal #2: Asharia will successfully navigate these life transitions including being able to manage her stress level. Short-Term Objectives: Objective 2A: Raenell will be able to identify stressors associated with the life transitions  Objective 2B: Dortha will be able to identify strategies to help navigate stressors associated with the life transitions and implement them  Interventions: Assertiveness/Communication, Psychologist, occupational, and Psycho-education/Bibliotherapy , and other evidenced-based practices will be used to promote progress towards healthy functioning and to help manage decrease symptoms associated with their diagnosis.  Treatment Regimen: Individual skill building sessions every 2-4 weeks to address treatment goal/objective Target Date: 04/2022 Responsible Party: therapist and patient Person delivering treatment: Licensed Psychologist [name], [name] will support the patient's ability to achieve the goals identified. Resolved:  No  Problem/Need: Life skills - Anastasija needs to develop some additional skills to facilitate independent living. Long-Term Goal #3: Gary will identify the additional life skills necessary to facilitate independent living. Short-Term Objectives: Objective 3A: Anjalee will be able to identify areas that she needs to address to increase the likelihood of her being able to successfully live independently.   Objective 3B: Mckaylee will be able to create a plan to address weaknesses described in objective 3A.   Objective 3C: Daisy will be able to implement steps on the plan developed in objective 3B. Interventions: Motivational Interviewing,  Solution-Oriented/Positive Psychology, and Psycho-education/Bibliotherapy  daily life skills, and other evidenced-based practices will be  used to promote progress towards healthy functioning and to help manage decrease symptoms associated with their diagnosis.  Treatment Regimen: Individual skill building sessions every 2-4 weeks to address treatment goal/objective Target Date: 04/2022 Responsible Party: therapist and patient Person delivering treatment: Licensed Psychologist [name], [name] will support the patient's ability to achieve the goals identified. Resolved:  No               [name], [name]

## 2021-09-12 ENCOUNTER — Telehealth: Payer: Self-pay | Admitting: Family

## 2021-09-16 ENCOUNTER — Encounter (INDEPENDENT_AMBULATORY_CARE_PROVIDER_SITE_OTHER): Payer: Self-pay | Admitting: Pediatric Endocrinology

## 2021-09-17 HISTORY — PX: CHOLECYSTECTOMY: SHX55

## 2021-09-17 IMAGING — CT CT ABD-PELV W/ CM
2 of 4 series · 16 of 46 positions shown, 18 images · IV contrast (omnipaque)
Comparison: Abdomen ultrasound dated 04/28/2019. Abdomen and pelvis
CT dated 04/24/2017.

CLINICAL DATA: Right abdominal pain for the past 2 weeks, worse
today.

EXAM:
CT ABDOMEN AND PELVIS WITH CONTRAST
TECHNIQUE: Multidetector CT imaging of the abdomen and pelvis was performed
using the standard protocol following bolus administration of
intravenous contrast.
CONTRAST:  100mL OMNIPAQUE IOHEXOL 300 MG/ML  SOLN

[Series 2: axial st · axial · 0.80mm/px · z∈[+971,+1461]mm · 13 of 112 slices shown, 15 images]
[im 7/112  soft-tissue]
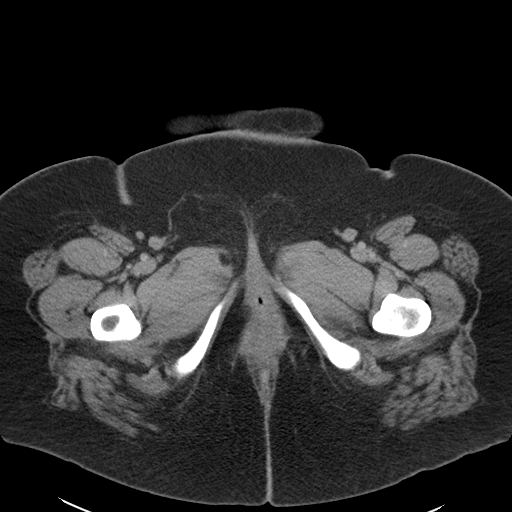
[im 7/112  bone]
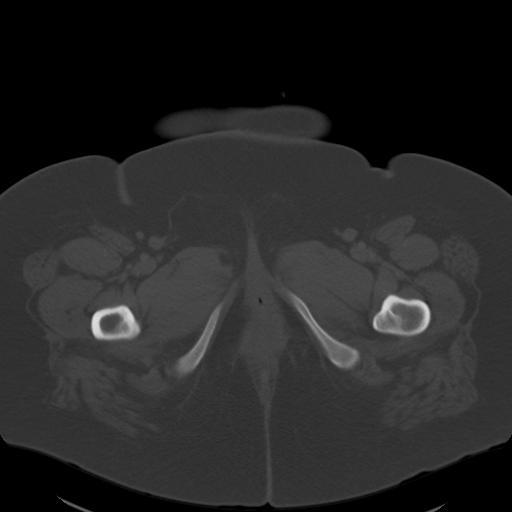
[im 14/112  soft-tissue]
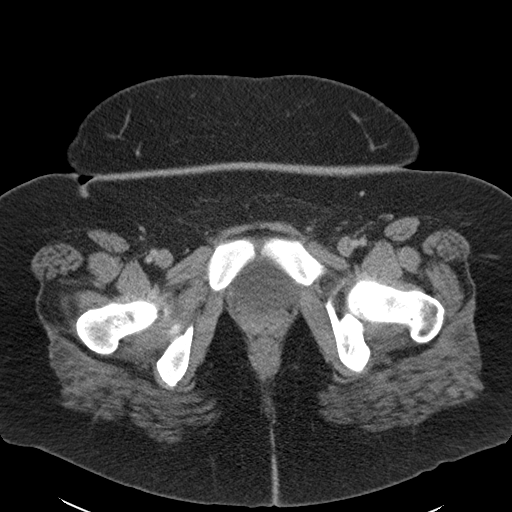
[im 27/112  soft-tissue]
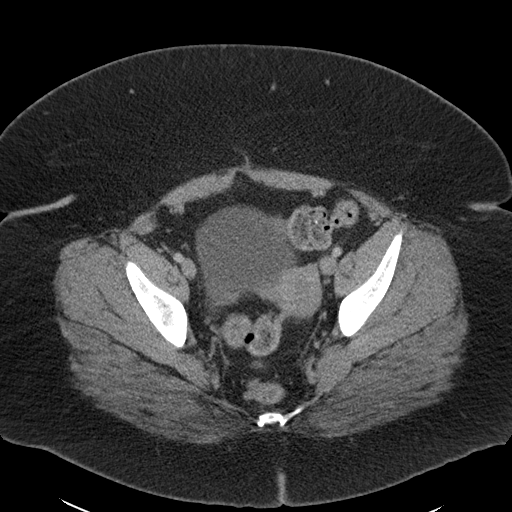
[im 33/112  soft-tissue]
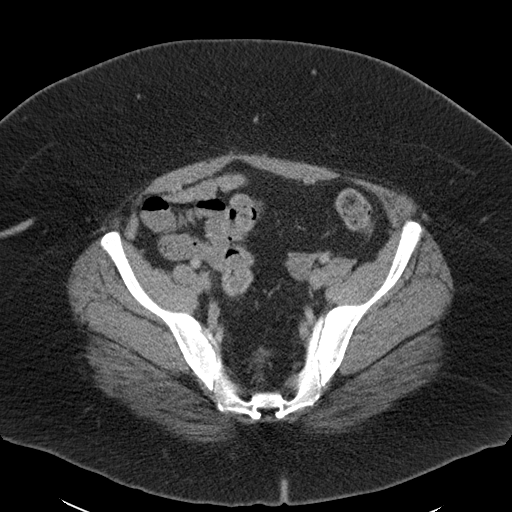
[im 40/112  soft-tissue]
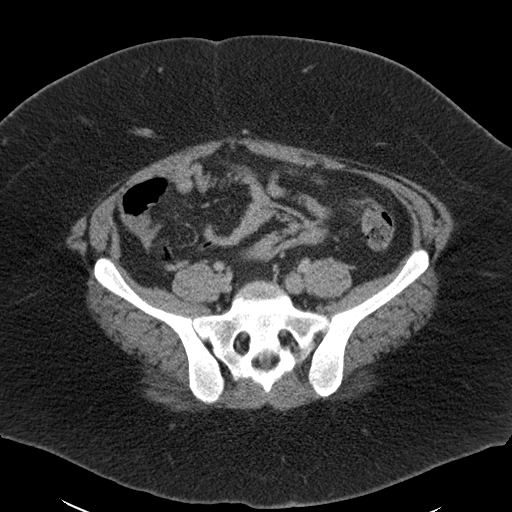
[im 46/112  soft-tissue]
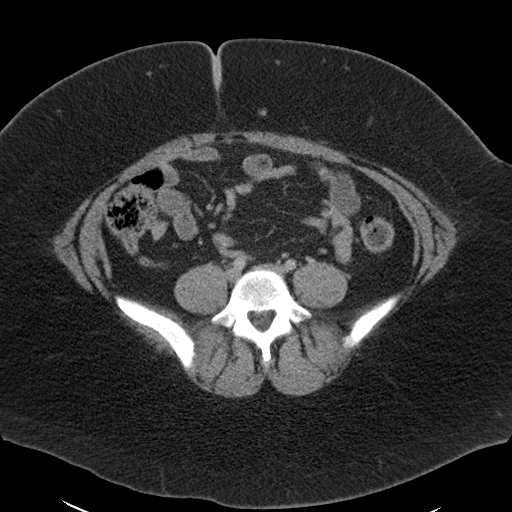
[im 59/112  soft-tissue]
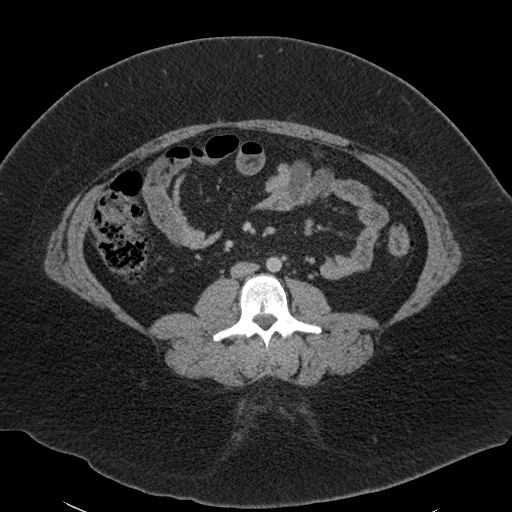
[im 66/112  soft-tissue]
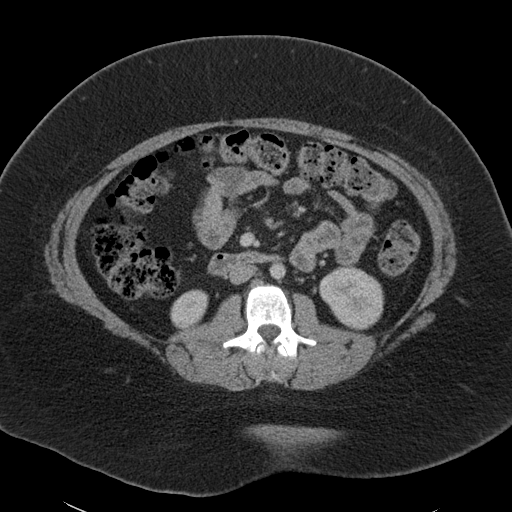
[im 72/112  soft-tissue]
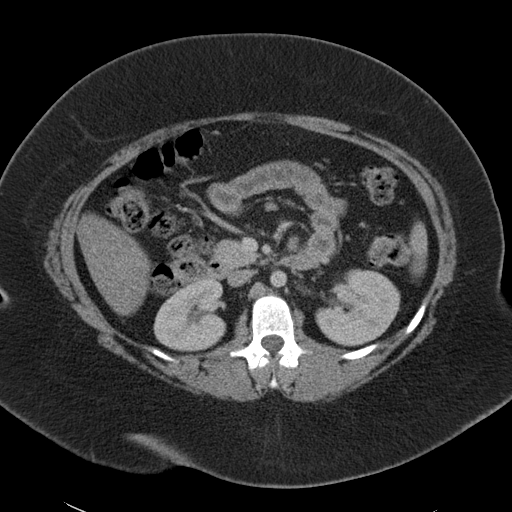
[im 72/112  bone]
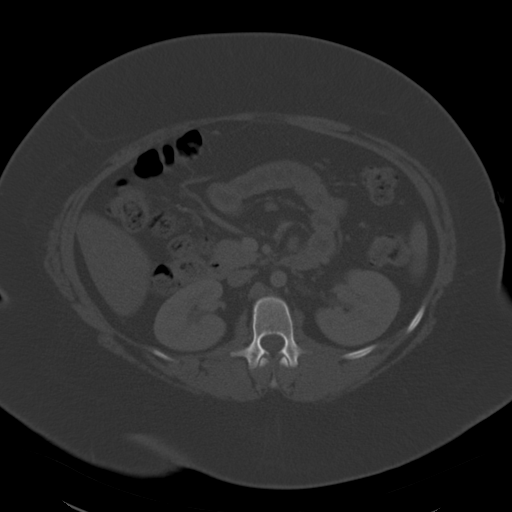
[im 79/112  soft-tissue]
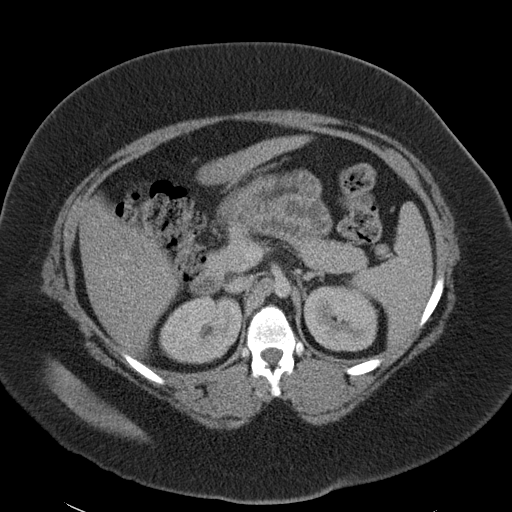
[im 85/112  soft-tissue]
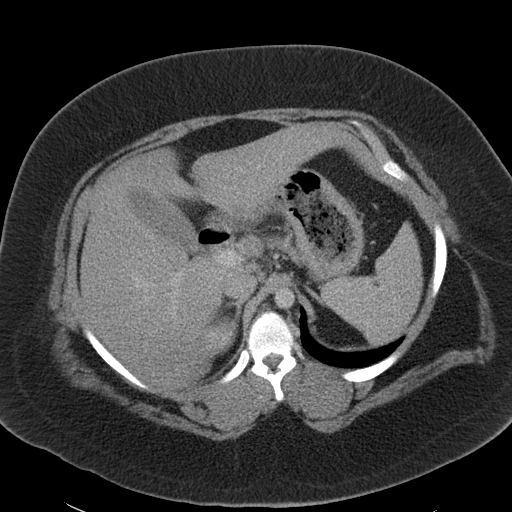
[im 98/112  soft-tissue]
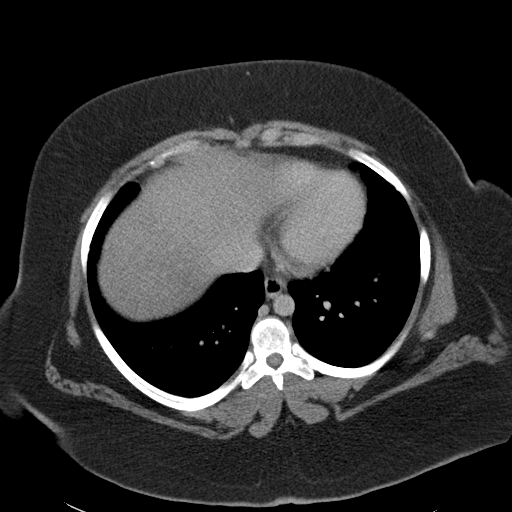
[im 105/112  soft-tissue]
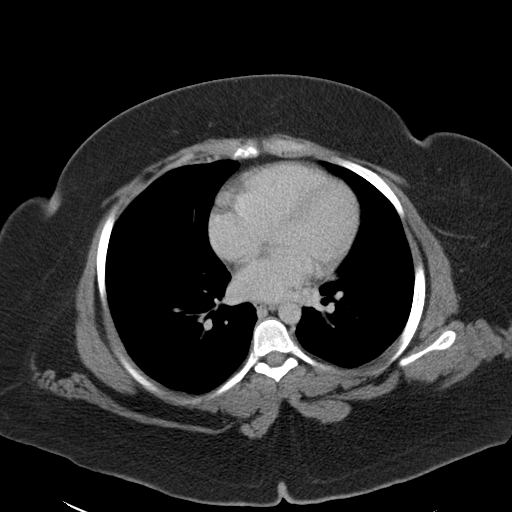

[Series 5: coronal st · coronal · 0.82mm/px · 3 of 159 slices shown]
[im 53/159  soft-tissue]
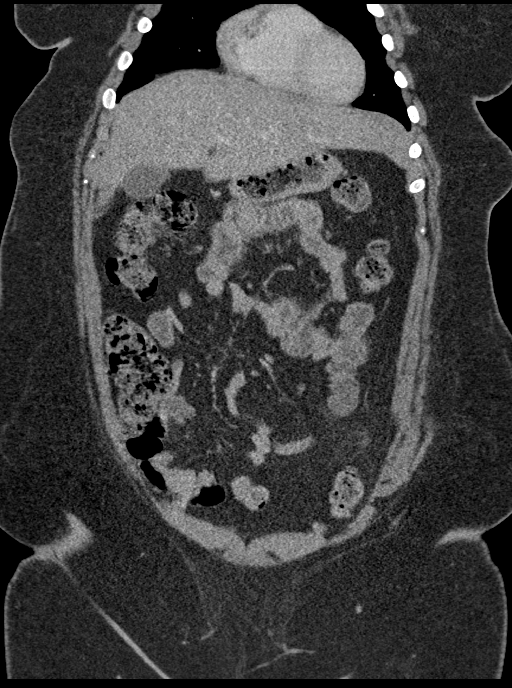
[im 71/159  soft-tissue]
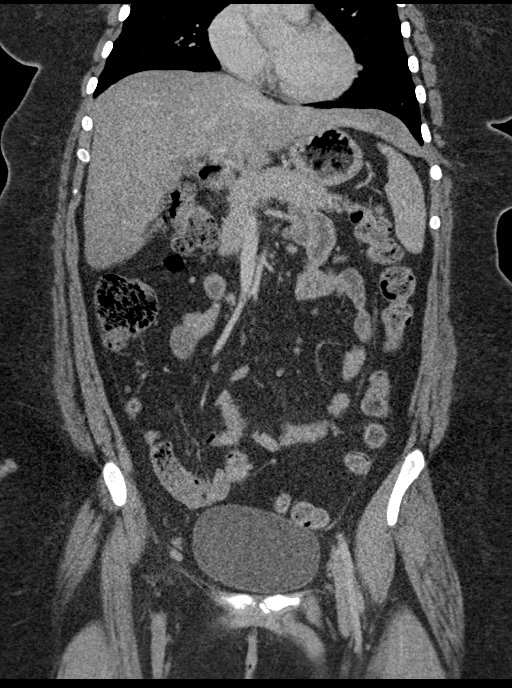
[im 88/159  soft-tissue]
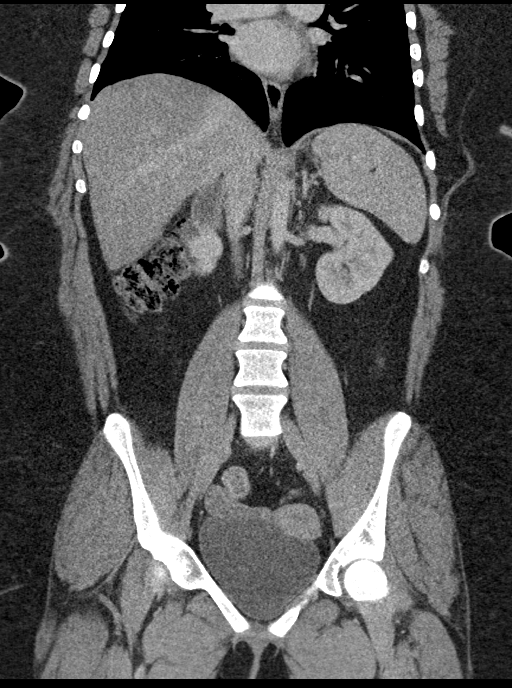

[16 of 46 positions shown; findings below may reference images not displayed]

FINDINGS: Lower chest: Unremarkable.

Hepatobiliary: Stable mild diffuse low density of the liver. Normal
appearing gallbladder.

Pancreas: Unremarkable. No pancreatic ductal dilatation or
surrounding inflammatory changes.

Spleen: Normal in size without focal abnormality.

Adrenals/Urinary Tract: Normal appearing adrenal glands. Stable left
renal cyst. Normal appearing right kidney, ureters and urinary
bladder.

Stomach/Bowel: Stomach is within normal limits. Appendix appears
normal. No evidence of bowel wall thickening, distention, or
inflammatory changes.

Vascular/Lymphatic: No significant vascular findings are present. No
enlarged abdominal or pelvic lymph nodes.

Reproductive: Uterus and bilateral adnexa are unremarkable.

Other: No abdominal wall hernia or abnormality. No abdominopelvic
ascites.

Musculoskeletal: Unremarkable bones. Stable T11 limbus vertebra.
IMPRESSION: 1. No acute abnormality.
2. Stable mild diffuse hepatic steatosis.

## 2021-09-19 ENCOUNTER — Ambulatory Visit: Admitting: Clinical

## 2021-09-19 DIAGNOSIS — F840 Autistic disorder: Secondary | ICD-10-CM

## 2021-09-19 NOTE — Progress Notes (Signed)
Time: 8:00am-12:30pm CPT Code: 96136P 1 unit, 96137P 8 units   Sarah Shah was seen in person for cognitive and behavioral testing. She completed the Stanford Binet-5 (2 hours, 96136P 1 unit, 96137P 3 units), followed by the ADOS-2, Module 4 (1 hour, 96137P, 2 units). The examiner then scored completed items (1.5 hours, 96137P 3 units). Follow up will be scheduled once all questionnaires have been completed and returned.                Chrissie Noa, [name]

## 2021-10-01 ENCOUNTER — Ambulatory Visit: Admitting: Clinical

## 2021-10-02 ENCOUNTER — Ambulatory Visit (INDEPENDENT_AMBULATORY_CARE_PROVIDER_SITE_OTHER): Admitting: Clinical

## 2021-10-02 ENCOUNTER — Encounter: Payer: Self-pay | Admitting: Family Medicine

## 2021-10-02 ENCOUNTER — Telehealth: Payer: Self-pay | Admitting: Adult Health

## 2021-10-02 ENCOUNTER — Other Ambulatory Visit: Payer: Self-pay

## 2021-10-02 DIAGNOSIS — F3341 Major depressive disorder, recurrent, in partial remission: Secondary | ICD-10-CM | POA: Diagnosis not present

## 2021-10-02 DIAGNOSIS — F902 Attention-deficit hyperactivity disorder, combined type: Secondary | ICD-10-CM

## 2021-10-02 DIAGNOSIS — F840 Autistic disorder: Secondary | ICD-10-CM | POA: Diagnosis not present

## 2021-10-02 MED ORDER — AMPHETAMINE SULFATE 10 MG PO TABS: ORAL_TABLET | ORAL | 0 refills | Status: DC

## 2021-10-02 NOTE — Telephone Encounter (Signed)
Pended.

## 2021-10-02 NOTE — Telephone Encounter (Signed)
Next visit is 10/10/21. Sarah Shah, Sarah Shah is on [place]. She called requesting a refill on her Dextroamphetamine tablets 10 mg #150. She is out of refills per pharmacy.    [id number] [address], [place]  Phone:  [phone]  Fax:  [phone]

## 2021-10-02 NOTE — Progress Notes (Signed)
[facility] Counselor/Therapist Progress Note  Patient ID: Sarah Shah, MRN: [id number]    Date: 10/02/21  Time Spent: 2:02 pm - 2:48 pm: 32 Minutes  Type of Service Provided Individual Therapy  Type of Contact virtual (via Webex with real time audio and visual interaction) Patient Location: home       Provider Location: office  Sarah Shah participated from home, via video, and consented to treatment. Therapist participated from office.   Mental Status Exam: Appearance:  Casual     Behavior: Appropriate  Motor: Normal  Speech/Language:  Clear and Coherent  Affect: Appropriate  Mood: normal  Thought process: normal  Thought content:   WNL  Sensory/Perceptual disturbances:   WNL  Orientation: oriented to person, place, situation, and day of week  Attention: Good  Concentration: Good  Memory: WNL  Fund of knowledge:  Fair  Insight:   Fair  Judgment:  Good  Impulse Control: Good   Risk Assessment: no apparent indicators of SI or HI  Presenting Problems, Reported Symptoms, and /or Interim History: Sarah Shah presented to a session to address anxiety and life stress.   Subjective: Sarah Shah presented for an individual outpatient therapy session. The following was addressed during sessions.   Of note, the visit today occurred virtually because Sarah Shah was ill. Sarah Shah reported that despite being ill, her overall mood and anxiety have improved somewhat since the last visit. For example, she rated her anxiety as a 4 out of 10 (with 10 being a high level of anxiety).  She also reported that her sleep has improved and her mood is a bit better than it was. She has been trying to complete more assignments on her own, which has allowed her more time to sleep. She reported on one incident with her mother where Sarah Shah had thoughts that increased her negative feelings after a critique of some of her work by her mother (e.g., thoughts that her work was "worthless").  Strategies to  combat this type of thinking were discussed.  In addition, strategies to use to help maintain a decreased stress level were discussed with the goal of keeping stress at a 5 or below out of a 10-point scale.  Some of the strategies included time with her dog, exercising, and eating well-balanced meals.  Independent living activities were discussed and Sarah Shah identified cooking as an area that she would like to try to address.  Interventions/Psychotherapy Techniques Used During Session: Cognitive Behavioral Therapy and Solution-Oriented/Positive Psychology  Diagnosis: Autism spectrum disorder  Recurrent major depressive disorder, in partial remission ([place])  MENTAL HEALTH INTERVENTIONS USED DURING TREATMENT & PATIENT'S RESPONSE TO INTERVENTIONS:  Short-term Objective addressed today:Sarah Shah will be able to use appropriate coping strategies including helpful thoughts to maintain a low level of anxiety and depressive symptoms. Mental health techniques used: Objective was addressed in session through the use of Cognitive Behavioral Therapy and discussion. Nazifa's response was positive.  Progress Toward Goal: Progressing  Short-term Objective addressed today: Sarah Shah will identify the additional life skills necessary to facilitate independent living and Sarah Shah will be able to create a plan to address weaknesses described in objective 3A.   Mental health techniques used: Objective was addressed in session through the use of Solution-Oriented/Positive Psychology and discussion. Sarah Shah's response was positive.  Progress Toward Goal: Progressing      PLAN  1. Sarah Shah  will return for a therapy session.   2. Homework Given: Try to maintain a stress level at 5 or below, try cooking 1 meal.  This homework will be reviewed with Sarah Shah at the next visit.  3. During the next session check in on mood and anxiety, stress associated with school, and long-term life skill goals.     [name], [name]   Individual  Treatment Plan  - please see the notes from 04/17/2021 & 05/01/2021 for complete treatment plan information.  Problem/Need: Sarah Shah has a history of anxiety and mood concerns - with some ongoing challenges in these areas  Long-Term Goal #1: Sarah Shah will be able to identify and address any mood or anxiety challenges and will maintain a low level of anxiety and depression symptoms as evidenced by self-report. Short-Term Objectives: Objective 1A: Sarah Shah will be able to rate her mood and anxiety  Objective 1B: Sarah Shah will be able to identify any negative thinking patterns that are perpetuating anxiety or depressive symptoms and replace these with more adaptive thoughts.  Objective 1C: Sarah Shah will be able to use appropriate coping strategies including helpful thoughts to maintain a low level of anxiety and depressive symptoms. Interventions: Cognitive Behavioral Therapy, Psychologist, occupational, Motivational Interviewing, and Psycho-education/Bibliotherapy  coping skills and other evidenced-based practices will be used to promote progress towards healthy functioning and to help manage decrease symptoms associated with their diagnosis.  Treatment Regimen: Individual skill building sessions every 2-4 weeks to address treatment goal/objective Target Date: 04/2022 Responsible Party: therapist and patient Person delivering treatment: Licensed Psychologist [name], [name] will support the patient's ability to achieve the goals identified. Resolved:  No  Problem/Need: Life transition - Sarah Shah is in the process of completing a major life transition including graduating from high school, starting college, and potentially working. Long-Term Goal #2: Sarah Shah will successfully navigate these life transitions including being able to manage her stress level. Short-Term Objectives: Objective 2A: Sarah Shah will be able to identify stressors associated with the life transitions  Objective 2B: Sarah Shah will be able to identify strategies  to help navigate stressors associated with the life transitions and implement them  Interventions: Assertiveness/Communication, Psychologist, occupational, and Psycho-education/Bibliotherapy , and other evidenced-based practices will be used to promote progress towards healthy functioning and to help manage decrease symptoms associated with their diagnosis.  Treatment Regimen: Individual skill building sessions every 2-4 weeks to address treatment goal/objective Target Date: 04/2022 Responsible Party: therapist and patient Person delivering treatment: Licensed Psychologist [name], [name] will support the patient's ability to achieve the goals identified. Resolved:  No  Problem/Need: Life skills - Shaguana needs to develop some additional skills to facilitate independent living. Long-Term Goal #3: Sarah Shah will identify the additional life skills necessary to facilitate independent living. Short-Term Objectives: Objective 3A: Sarah Shah will be able to identify areas that she needs to address to increase the likelihood of her being able to successfully live independently.   Objective 3B: Sarah Shah will be able to create a plan to address weaknesses described in objective 3A.   Objective 3C: Sarah Shah will be able to implement steps on the plan developed in objective 3B. Interventions: Motivational Interviewing, Solution-Oriented/Positive Psychology, and Psycho-education/Bibliotherapy  daily life skills, and other evidenced-based practices will be used to promote progress towards healthy functioning and to help manage decrease symptoms associated with their diagnosis.  Treatment Regimen: Individual skill building sessions every 2-4 weeks to address treatment goal/objective Target Date: 04/2022 Responsible Party: therapist and patient Person delivering treatment: Licensed Psychologist [name], [name] will support the patient's ability to achieve the goals identified. Resolved:  No  [name], [name]

## 2021-10-02 NOTE — Telephone Encounter (Signed)
Dr Einar Pheasant said to ck with pts mom and if pt is improving and no distress with breathing can change 10/03/21 visit to [facility] and will discuss at that time about need for CPX and if needed to try and reschedule. Pts mom is fine with that. Self quarantine, drink plenty of fluids, rest, and take Tylenol for fever. UC & ED precautions given and pt's mom voiced understanding.

## 2021-10-02 NOTE — Telephone Encounter (Signed)
Agree will see patient tomorrow

## 2021-10-02 NOTE — Telephone Encounter (Signed)
I spoke with pts mom(DPR signed); on 09/29/21 pt started with head congestion and tight feeling sinus. H/A on 10/01/21 dry cough started. + home covid test on 10/01/21. Today sinus seems little better and pts mom been giving pt tylenol since 09/29/21 so no fever noted. No CP or SOB. Pts mom wonders if she should be on antiviral. Pt already had appt scheduled on 10/03/21 for CPX which pt mom hoped to get from Dr Einar Pheasant before she leaves. Pt will plan on keeping appt at [facility] on 10/03/21 unless gets cb with other instructions. [facility] sending note to Dr Einar Pheasant and [place] CMA.

## 2021-10-03 ENCOUNTER — Telehealth (INDEPENDENT_AMBULATORY_CARE_PROVIDER_SITE_OTHER): Admitting: Family Medicine

## 2021-10-03 DIAGNOSIS — F332 Major depressive disorder, recurrent severe without psychotic features: Secondary | ICD-10-CM | POA: Diagnosis not present

## 2021-10-03 DIAGNOSIS — Z6841 Body Mass Index (BMI) 40.0 and over, adult: Secondary | ICD-10-CM

## 2021-10-03 DIAGNOSIS — E282 Polycystic ovarian syndrome: Secondary | ICD-10-CM

## 2021-10-03 DIAGNOSIS — F913 Oppositional defiant disorder: Secondary | ICD-10-CM

## 2021-10-03 DIAGNOSIS — U071 COVID-19: Secondary | ICD-10-CM

## 2021-10-03 DIAGNOSIS — K219 Gastro-esophageal reflux disease without esophagitis: Secondary | ICD-10-CM

## 2021-10-03 DIAGNOSIS — G43809 Other migraine, not intractable, without status migrainosus: Secondary | ICD-10-CM

## 2021-10-03 DIAGNOSIS — G43909 Migraine, unspecified, not intractable, without status migrainosus: Secondary | ICD-10-CM | POA: Insufficient documentation

## 2021-10-03 MED ORDER — OMEPRAZOLE 20 MG PO CPDR: 20.0000 mg | DELAYED_RELEASE_CAPSULE | Freq: Every day | ORAL | 3 refills | Status: DC

## 2021-10-03 NOTE — Assessment & Plan Note (Signed)
Stable. Cont fluoxetine 40 mg. Working with [employer].

## 2021-10-03 NOTE — Assessment & Plan Note (Signed)
Stable. Cont topiramate ER 50 mg

## 2021-10-03 NOTE — Assessment & Plan Note (Signed)
Regained weight after insurance stopped covering mounjaro. Cont with Dr. Juleen China. Working on getting medication covered. Cont healthy diet

## 2021-10-03 NOTE — Assessment & Plan Note (Signed)
Controlled. But will try to reduce dose omeprazole 20 mg.

## 2021-10-03 NOTE — Assessment & Plan Note (Signed)
Following with Dr. Baldo Ash. On OCP with breakthrough bleeding. Advised calling endocrine next week if bleeding does not stop.

## 2021-10-03 NOTE — Assessment & Plan Note (Signed)
Stable, seeing [place]. Cont buspar 10 mg and clonidine 0.2 mg bid.

## 2021-10-03 NOTE — Patient Instructions (Signed)
Glad she is getting better  Continue to rest and hydrate  Wear as mask through 10/09/2021

## 2021-10-03 NOTE — Progress Notes (Signed)
I connected with [name] on 10/03/21 at 10:00 AM EDT by video and verified that I am speaking with the correct person using two identifiers.   I discussed the limitations, risks, security and privacy concerns of performing an evaluation and management service by video and the availability of in person appointments. I also discussed with the patient that there may be a patient responsible charge related to this service. The patient expressed understanding and agreed to proceed.  Patient location: Home Provider Location: [place] Participants: Lynnda Child and [name] and mom   Subjective:     Sarah Shah is a 21 y.o. female presenting for Follow-up (Covid questions & touch base on medications )     HPI  #Covid  - 09/29/2021 - overall feels ok - already getting better - Monday was very fatigue - sleeping Monday - Thursday - was taking medication - tylenol cold and flu medication which worked well for symptoms - 2 every 4 hours - Gatorade, hydration - feeling much better  - sister works at longterm care and also got sick - was tested at work - and positive - mom now sick w/ covid  Jamilee tends to get sick on her birthday and around season changes  Taking OCPs continuously - active pill daily - is on her period even on her active pill on day 5  - sometimes just spotting - using 5 pads - changing when she uses the bathroom  - mom notes sensitivity issue to   Weight  - regained - insurance issues with mounjaro - agitation with phentermine  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    LMP 09/29/2021   Physical Exam Constitutional:      Appearance: Normal appearance. She is not ill-appearing.  HENT:     Head: Normocephalic and atraumatic.     Right Ear: External ear normal.     Left Ear: External ear normal.  Eyes:     Conjunctiva/sclera: Conjunctivae normal.  Pulmonary:     Effort: Pulmonary effort is normal. No respiratory distress.  Neurological:     Mental Status: She is alert. Mental status is at baseline.  Psychiatric:        Mood and Affect: Mood normal.        Behavior: Behavior normal.        Thought Content: Thought content normal.        Judgment: Judgment normal.            Assessment & Plan:   Problem List Items Addressed This Visit       Cardiovascular and Mediastinum   Migraines    Stable. Cont topiramate ER 50 mg        Digestive   GERD (gastroesophageal reflux disease)    Controlled. But  will try to reduce dose omeprazole 20 mg.       Relevant Medications   omeprazole (PRILOSEC) 20 MG capsule     Endocrine   PCOS (polycystic ovarian syndrome) - Primary    Following with Dr. Baldo Ash. On OCP with breakthrough bleeding. Advised calling endocrine next week if bleeding does not stop.         Other   Oppositional defiant disorder    Stable, seeing [place]. Cont buspar 10 mg and clonidine 0.2 mg bid.       Class 3 severe obesity with serious comorbidity and body mass index (BMI) of 50.0 to 59.9 in adult ([place])    Regained weight after insurance stopped covering mounjaro. Cont with Dr. Juleen China. Working on getting medication covered. Cont healthy diet      Severe episode of recurrent major depressive disorder, without psychotic features ([place])    Stable. Cont fluoxetine 40 mg. Working with [employer].       Other Visit Diagnoses      COVID-19 virus infection          She has high risk for COVID, however symptoms are improving.  Advised continuing to monitor.  Discussed isolation guidelines and ER precautions.  Return in about 3 months (around 01/02/2022) for TOC with Tabitha.  Lesleigh Noe, MD

## 2021-10-04 ENCOUNTER — Other Ambulatory Visit: Payer: Self-pay | Admitting: Family Medicine

## 2021-10-06 ENCOUNTER — Other Ambulatory Visit: Payer: Self-pay

## 2021-10-06 DIAGNOSIS — F902 Attention-deficit hyperactivity disorder, combined type: Secondary | ICD-10-CM

## 2021-10-06 MED ORDER — AMPHETAMINE SULFATE 10 MG PO TABS: ORAL_TABLET | ORAL | 0 refills | Status: DC

## 2021-10-06 NOTE — Telephone Encounter (Signed)
[facility] out of stock for [employer]. Send to [employer].

## 2021-10-06 NOTE — Telephone Encounter (Signed)
Pended.

## 2021-10-09 ENCOUNTER — Ambulatory Visit (INDEPENDENT_AMBULATORY_CARE_PROVIDER_SITE_OTHER): Admitting: Clinical

## 2021-10-09 DIAGNOSIS — F840 Autistic disorder: Secondary | ICD-10-CM

## 2021-10-09 NOTE — Progress Notes (Signed)
Time: 8:00am-9:00am CPT code: 42353I 6 units  Examiner completed reported writing on 09/21/2021 (2 hours, 96131P 2 units) 09/22/21 (1 hour, 96131P 1 unit), 09/23/2021 (1 hour, 96131P 1 unit), and 09/30/2021 (1 hour, 96131P 1 unit).   Sarah Shah and her mother were seen remotely using secure video conferencing for an hour-long feedback from testing (1 hour, 96131P 1 unit). They were in a parked car outside of their home in [place], and the examiner was in her office at the time of the appointment, which focused on providing feedback from Sarah Shah evaluation. They were receptive to all results and recommendations, and requested that results be sent as a PDF attached to an encrypted email, as well as sent to [facility] psychiatrist and the office of disability services. They will reach out with any additional questions or concerns.      Name: Sarah Shah Date of Birth: 01-22-2000 Dates of Evaluation: 08/14/2021, 09/19/2021 Chronological Age: 21 years, 11 months Examiner: Amarria Andreasen L. Arushi Partridge, Ph.D., HSP-P (License #[id number])  Reason for Referral: Sarah Shah's mother sought re-evaluation for autism spectrum disorder (ASD) to confirm diagnosis and obtain updated cognitive and adaptive functioning scores in order for Sarah Shah to be eligible for continued academic and vocational support in adulthood.   Assessments Administered: Semi-Structured Developmental Interview based on Autism Diagnostic Interview, Revised (Parental Report) Behavior Assessment System for Children, 3rd Edition (BASC-3, Parent, Teacher, Self Report) Adult Self Report (ASR) Social Responsiveness Scale, 2nd Edition (Parent, Teacher, Self Report) Social Communication Questionnaire, Lifetime Form (SCQ, Parent Report) Adaptive Behavior Assessment System, 3rd Edition (ABAS-3, Parent Report) Stanford-Binet Intelligence Scales, 5th Edition (SB-5) Autism Diagnostic Observation Schedule, 2nd Edition (ADOS-2), Module 4  Previous Diagnoses: Sarah Shah has  previously been diagnosed with AD/HD, Oppositional Defiant Disorder, Developmental Delay, ASD, dysgraphia, and anxiety. She was diagnosed with ASD in fall of 2018 by the present examiner.  Medical History: Sarah Shah's mother reported that she experienced high blood pressure during her pregnancy with Sarah Shah, but the pregnancy was reportedly otherwise healthy. Brandalyn was delivered via scheduled C-section due to her suspected large size at birth at [redacted] weeks gestation. She was able to go home from the hospital within a typical timeframe. At 20 months of age, Temesha was placed on medication for reflux. At 20 months of age, she was hospitalized briefly for RSV and pneumonia. At 20 months old, Sonika was hospitalized again with pneumonia. Rashaunda has experienced frequent constipation since childhood that requires the regular use of a stool softener. She was diagnosed with PCOS and insulin resistance in November of 2017. Genie is seen at [facility], at [facility], to manage her anemia. She has had 5 iron infusions since fall of 2018. She suffered significant lethargy and low energy throughout 10th and 11th grade due to iron deficiencies. Sarah Shah fractured her ankle in September 2021. Nastassia suffered COVID in October of 2022. 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 In April of 2023 she tested positive again, but had no symptoms and this is believed to have been a false positive. Ashleyanne also reports chronic pain on her right side that is undiagnosed despite Sarah Shah having undergone a colonoscopy and endoscopy. At follow up, Sharnika was prescribed: Evekeo 29m, 5 of them daily, clonidine, 0.163m 2 of them twice daily, BuSpar 1064m2 of them twice daily and 2 additional as needed, Vitamin C 500m1mron 325mg14muoxetine 40mg 88mitional 10 mg  as needed), Topamax 50 mg, phentermine 7m, omeprazole 432m  Abbagayle's mother described her childhood as otherwise healthy. Sarah Shah seen by [facility] individual therapy at time of follow up.  Family History: Paternal family history is significant for anxiety, depression, learning disabilities, PCOS, and Type 1 diabetes. Maternal family history is significant for Type 2 diabetes, anxiety, high blood pressure, PMDD, and AD/HD. Artina's parents divorced in 2007, when EmWinnias 5 20ears old. Daizha's mother remarried in April of 2015. EmNazaninpends the majority of her time with her mother, 20 20ear old sister, and 20 20ear old brother, as well as some holidays with her biological father. EmCayliived with her mother, stepfather, and older sister at time of follow-up. Her sister moved out to attend college but moved back into the home in May of 2023.  Educational History: EmIvankaegan attending daycare from 7 weeks to [age]ears of age and was reported to have done well in this setting. At [age]ears of age, EmEdynransferred to a new daycare and was reported to have begun demonstrating behavioral difficulties. She transferred daycare programs again at the age of 5,55when the family moved. EmKeishiaransferred daycare programs again after just a few months due to difficulty keeping up with curriculum. She underwent psychological testing at the age of [age]and received diagnoses of developmental delay, learning disability, and AD/HD. EmTysheaegan attending kindergarten one month before her 6t1thirthday. She changed schools halfway through the year when the family moved, and repeated kindergarten the following year. EmEllenehen remained at [employer] 5th grade, where she had an IEP for speech, occupational therapy, reading, and math. In 2010, [facility]. EmEldorishen began 6th grade at the [facility] [place] to receive services  through an IEP, and was placed in small classrooms where she was permitted to work at her own grade level (for example, EmWaunettaas provided with 4th grade coursework during her 6th grade year). Her mother reported that she experienced challenges with discipline and getting along with peers during her 6th grade year. EmTamirahepeated her 7th grade year due to slow academic progress. She began 8th grade at [employer] completed an online curriculum (the [employer] continued to complete coursework at a 4th grade level. She was reported to experience difficulty during testing. Additionally, her mother expressed concern that she had not been directly taught in the setting due to classmates functioning at a 7th and 8th grade level. Although EmMoniciaas in the room with her peers, she completed her coursework separately from the rest of the class, on the computer. EmRaquel Sarnattended [employer] 9th-12th grades. School went virtual in March of 2020 due to the [place], when EmAiyannaas in 10th grade. EmErialontinued to attend virtually through the end of that school year. School resumed in-person in fall of 2020, with frequent breaks for virtual school for various reasons. School continued completely in person as of fall of 2021. EmKaremaraduated from high school June of 2023, and was in the process of pursuing higher education at [employer] time of follow up.  Results from Previous Evaluations EmNatalias undergone several psychological evaluations, beginning when she was between the ages of 4-5 to the present. A summary of results relevant to the current evaluation is provided in the tables below.   Differential Ability Scales, 2nd  Edition, Early Years (2007-2008 School Year) Composite Standard Score  Verbal 99  Nonverbal Reasoning 87  Spatial 75  GCA 83     Wechsler Intelligence Scale for Children-Fifth Edition (May 2010) Domain Standard Score  Verbal  Comprehension  75  Perceptual Reasoning 90  Working Memory 74  Processing Speed 106  Full Scale IQ 81   Woodcock-Johnson Tests of Achievement, Third Edition (May 2010) Domain Standard Score  Basic Reading 85  Reading Comprehension 38  Math Calculation Skills 60  Math Reasoning 76  Written Expression 90   Differential Ability Scales, 2nd Edition, School Age (March 2011) Composite Standard Score  Verbal 87  Nonverbal Reasoning 93  Spatial 82  GCA 85   Differential Ability Scales, 2nd Edition, School Age (2014) Domain Standard Score  Verbal 76  Nonverbal Reasoning 88  Spatial 78  GCA 77   Woodcock-Johnson Tests of Achievement, Third Edition (2014) Domain Standard Score  Basic Reading 1  Broad Written Language 51  Reading Comprehension 49  Math Calculation Skills 57  Math Reasoning 56  Written Expression 64   [place]eland Adaptive Behavior Scales-Second Edition (2014) Domain Standard Score T=Teacher, P=Parent  Adaptive Behavior Composite 58 (T), 67 (P)  Communication 60 (T), 69 (P)  Daily Living Skills 60 (T), 68 (P)  Socialization 61 (T), 68 (P)   Autism Diagnostic Observation Schedule-2nd Edition (2014) Did not fall within the Autism Spectrum range. Behavior Rating Inventory of Publishing copy (Teacher report, 2014) General Executive Composite=106 Behavior Regulation Index=95 Metacognition Index=106  Woodcock-Johnson Tests of Achievement, Third Edition (2017) Domain Standard Score Scaled Score  Basic Reading 50  Broad Reading <40  Reading 52  Reading Fluency <40  Mathematics <40  Broad Mathematics <40  Broad Written Language 46  Written Expression 68  Academic Skills <40  Academic Fluency <28  Academic Applications 58  Brief Achievement <40  Broad Achievement <40  Reading 52  Math Calculation Skills <40  Written Language 50    Wechsler Individual Achievement Test-Third Edition (2018) Domain Standard Score  Total Reading 61  Basic Reading 52   Reading Comprehension 69  Mathematics 56  Written Expression 64    BASC-3, Parent and Teacher Report (administered October 2018) Domain T-Score Percentile Rank 95% Confidence Interval   Parent Report Teacher Report Parent Report Teacher Report Parent Report Teacher Report  Externalizing 61 74 89 96 57-65 71-77  Hyperactivity 54 74 75 96 47-61 68-80  Aggression 65 78 93 97 59-71 72-84  Conduct Problems 63 65 91 90 57-69 59-71  Internalizing 81 114 98 99 77-85 110-118  Anxiety 64 84 90 99 57-71 77-91  Depression 86 115 99 99 81-91 108-122  Somatization 83 120 88 99 77-89 112-128  School Problems  86  99  82-90  Attention Problems  77  99  72-82  Learning Problems  91  99  85-97  Behavioral 70 92 95 99 67-73 89-95  Atypicality 51 88 72 99 45-57 81-95  Withdrawal 80 70 99 94 73-87 63-77  Attention Problems 62  88  55-69   Adaptive  36 34 11 7 33-39 31-37  Adaptability 32 '25 5 1 ' 25-39 19-31  Social Skills 44 45 27 32 38-50 41-49  Leadership 37 40 11 19 30-44 34-46  Activities of Daily Living 40  16  32-48   Study Skills  32  5  28-36  Functional Communication 37 33 12 7 30-44 27-39     BASC-3, Self Report (administered October 2018) Domain T-Score Percentile  Rank 95% Confidence Interval  School Problems  65 91 59-71  Attitude to School 80 99 71-89  Attitude to Teachers 58 81 50-66  Sensation Seeking 45 32 37-53  Internalizing 64 90 61-67  Atypicality 52 70 44-60  Locus of Control 71 96 62-80  Social Stress 71 96 64-78  Anxiety 53 65 47-59  Depression 56 79 50-62  Inadequacy 58 82 50-66  Somatization 69 92 61-77  Inattention/Hyperactivity 52 63 46-58  Attention Problems 51 57 44-58  Hyperactivity 53 68 45-61  Emotional Symptoms  62 87 59-65  Relations with Parents 43 23 37-49  Interpersonal 33 7 25-41  Self-Esteem 53 51 46-60  Self-Reliance 28 2 20-36  Personal Adjustment 36 11 31-41   Social Communication Questionnaire, administered October 2018 Maternal score  on Lifetime Form: 17 (above 15-point cut-off) Teacher score on Current Form: 5 (below 15-point cut-off)  Differential Ability Scales, 2nd Edition, School Age (administered October 2018)  Composite Standard Score  Percentile 95% Confidence Interval Description   T Score   Age Equivalent  Verbal 76 5 70-87 Low  Verbal Similarities 40 16  10:9  Word Definitions 31 3  8:7  Nonverbal Reasoning 74 4 69-83 Low  Matrices 42 21  11:3  Sequential and Quantitative Reasoning 27 1  7:4  Spatial 69 2 64-77 Very Low  Recall of Designs 31 3  7:7  Pattern Construction 33 4  8:4  GCA 70 2 66-77 Low   ABAS-3, Maternal Report (administered October 2018)  Standard Score Percentile Rank Confidence Interval   Scaled Score    Conceptual 59 <1 55-63  Communication 3    Functional Academics 2    Self-Direction 2    Social 73 4 68-78  Leisure 4    Social 4    Practical 70                  2 66-74  Sarah Use 2    Home Living 4    Health and Safety 8    Self-Care 6    GAC 65 1 63-67   Autism Diagnostic Observation Schedule, 2nd Edition (ADOS-2), Module 3, administered October 2018: Emera score above the cut-off for autism on the ADOS-2.  Present Evaluation: Developmental and Behavioral History Developmental Milestones Satine's mother reported that she first experienced concern regarding Pascha's development when, at 21 years old, she demonstrated significant emotional lability. Naveena's gross motor milestones were also reported to have been significantly delayed, and she began walking at about 20 years of age. However, she was reported to have met language milestones largely on time. Bryannah's first word was reported to have been "no." She was reported to have begun using single words within a typical timeframe. Joelle was reported to have begun using simple phrases at 21 years of age. Toilet training was reported to have been challenging, and Isabellarose continued to have accidents throughout her first and second  years of kindergarten. Devanie achieved continence at about 21 years of age. No skill regressions were reported.   Social Affect With regard to the use of language for the purpose of reciprocal social communication, Annmargaret was reported to engage in conversations regularly. She occasionally demonstrates socially inappropriate statements, such as making comments about others while they are in within earshot, and did so when she was between the ages of 13-5. She and her mother described these comments as primarily intentional at follow up. Netasha's mother described the 54 of her struggles in high school as  relating to difficulty in social situations. She was not reported to engage in pronoun confusion, or to have done so between the ages of 4-5. With regard to the use of nonverbal behaviors for the purpose of communication, Sylvester was not reported to have pointed as a young child. Skila nodded and shook her head communicatively as a young child, but no longer does so. Overall, Taressa's use of gestures was reported to have been limited during her early childhood. Currently, her mother reports that she occasionally uses a handful of gestures. Jonell was not reported to have used dolls as agents or engaged in pretend play involving a plot as a young child. Instead, Maryela was reported to have engaged in detailed Lego constructions. Runa was reported to show a variety of objects for the purpose of sharing interest currently, and to have done so between the ages of 4-5. Stormi currently has a reciprocal friendship with a peer she knows through her church, and her mother reports that they talk on the phone regularly. However, she has a difficult time reciprocating friendship, and is working on her skills to communicate when she does not want to interact without hurting others' feelings.  Restricted and Repetitive Behaviors At follow up, Jenniah continued to demonstrate stereotyped speech that consisted of memorizing lines  from TikToks, Memes, and Holley Dexter shows. These comments are often non-communicative in nature. Teighlor also demonstrates idiosyncratic speech, and was reported to have done so as a young child. For example, Marlena used to refer to firetrucks as "siretrucks," even after having been corrected by others. Her mother also noted that she has a large vocabulary but often doesn't know what words mean, and so often uses these words inappropriately. She appears unaware of some social cues, and frequently interrupts others. Her mother related this tendency to Shayna's concrete thinking style. No verbal or behavioral rituals were reported presently or in the past. Yazmyne's mother reported that her intonation has been noted to be somewhat flat. With regard to unusually intense interests, Antaniya demonstrated an "all consuming" interest in [place] as a young child. Currently, she was reported to demonstrate an unusually intense interest in YouTube that interferes with her social functioning at times. She also continued to be very interested in [place] at follow up in 2023. Brittnae's mother reported that she was intensely attached to a particular doll between the ages of 41-8, and demonstrated significant behavioral difficulties if she was unable to bring it with her. No repetitive use of toys or objects was reported. However, at follow up in 2023, Maija's mother described her as "meticulous" with her cleaning, and reported that Coy becomes intensely focused on maintaining a clean household. She was also reported to demonstrate sensory aversions to the feelings of buttons, belts, jeans, and underwire on bras. Jniyah has been a picky eater with a restrictive diet since her early childhood. At follow up in 2023, Kimyetta had expanded her diet significantly, but her mother described her as routine oriented in her eating habits, and she continued to demonstrate sensory aversions to the smells of certain foods. In terms of sensory  interests, Caroleann's mother reported in 2018 that she preferred to be wrapped in a blanket, to the point of trying to take blankets with her even doing so is inappropriate to the situation. Lanaiya continued to do this throughout high school, however, she no longer brought the blanket out in public with her as of follow up in 2023. Adriel was reported to continue to struggle  with changes in routine. Her mother noted that she suspects Trina's defiant behavior emerged when she was a young child due to her difficulty with changes in routine. For example, Malaya demonstrated difficult behavior any time she was not able to return home immediately after school. She continues to struggle with changes in routine currently. No hand or body mannerisms were endorsed currently or at any point in the past.     Behavior Assessment System-Third Edition (BASC-3): To provide an overview of Ashawnti's emotional and behavioral functioning, Naquita's mother and her teacher, Sarah Shah, completed the Behavior Assessment Scales-Third Edition (BASC-3). The BASC-3 is a screening tool used to evaluate individuals aged 2 through 21 years across a variety of domains. Scores are normed against same-aged peers, and provided in the form of T-scores that have a mean of 50 and a standard deviation of 10. Score elevations and depressions can be indicative of behavioral and emotional domains that may merit further evaluation. On the Externalizing, Internalizing, and Behavioral scales of the BASC-3, scores below 60 are considered to be within the normal range, whereas scores in the 60-69 range are considered to be at risk. T-scores of 70 or higher are considered to be clinically significant. On the Adaptive Scales, T-scores above 40 are considered to be in the normal range, whereas T-scores in the 31-40 range are considered to be at risk, and T-scores of 30 and below are considered to be in the clinically significant range. Aqua's scores on the  BASC-3 are presented in the table below.  BASC-3, Parent and Teacher Report Domain T-Score Percentile Rank 95% Confidence Interval   Parent Report Teacher Report Parent Report Teacher Report Parent Report Teacher Report  Externalizing 81 01 75 10 25-85 27-78  Hyperactivity 62 46 85 50 56-68 40-52  Aggression 53 47 72 57 49-57 41-53  Conduct Problems 46 44 51 33 42-50 38-50  Internalizing 64 51 87 64 61-67 47-55  Anxiety 63 56 88 75 58-68 50-62  Depression 65 47 88 54 61-69 40-54  Somatization 62 51 85 66 57-67 45-57  School Problems  46  39  42-50  Attention Problems  43  27  39-47  Learning Problems  49  56  43-55  Behavioral 60 47 80 51 57-63 44-50  Atypicality 47 45 52 47 43-51 38-52  Withdrawal 67 55 92 71 60-74 49-61  Attention Problems 60  81  52-58   Adaptive  40 61 16 86 36-44 58-64  Adaptability 38 55 12 62 31-45 48-62  Social Skills 38 63 14 90 32-44 58-68  Leadership 46 62 35 84 39-53 56-68  Activities of Daily Living 48  42  37-59   Study Skills  60  82  57-63  Functional Communication 35 60 7 56 26-44 45-66   Madine's mother endorsed at-risk scores on the Internalizing and Behavioral domains of the BASC-3. She endorsed at-risk scores on the Anxiety, Depression, and Somatization subscales, indicating that Wyonia shows signs of anxiety and depression in the home setting, as well as complains of physical discomfort. She also endorsed at-risk scores on the Withdrawal and Attention Problems subscales, indicating that she demonstrates difficulty with social engagement and with focusing her attention. Orville's teacher endorsed all domains of the BASC-3 within normal limits except for the Adaptive domain, where she endorsed at-risk scores on the Social Skills, Leadership, [employer], and Functional Communication subscales. This suggests that she observes Sarah Shah to demonstrate difficulty with social interactions, academic tasks, and communication in the  school setting.   BASC-3,  Self-Report Domain T-Score Percentile Rank 95% Confidence Interval  School Problems  56 71 48-64  Attitude to School 57 75 44-70  Attitude to Teachers 61 84 52-70  Sensation Seeking 46 46 39-53  Internalizing 53 62 49-57  Atypicality 41 16 33-49  Locus of Control 61 85 52-70  Social Stress 44 32 37-51  Anxiety 46 39 40-52  Depression 51 62 45-57  Inadequacy 54 69 44-64  Somatization 67 91 58-76  Inattention/Hyperactivity 40 17 33-47  Attention Problems 40 20 33-47  Hyperactivity 42 26 33-51  Emotional Symptoms  53 64 49-57  Relations with Parents 46 30 39-53  Interpersonal 44 23 37-51  Self-Esteem 52 48 44-60  Self-Reliance 31 2 21-41  Personal Adjustment 41 21 35-47   Anmol endorsed scores that fell within normal limits across all domains of the BASC-3. Notably, although overall domain scores fell within normal limits, Shavonta endorsed an at-risk score on the Attitude to Teacher, Locus of Control, and Somatization subscales. This indicates that she experiences difficulty maintaining a positive attitude toward teachers, a limited sense of control over events in her life, and physical discomfort.    Social Responsiveness Scale, 2nd Edition (SRS-2): To specifically screen for ASD, Raena, her mother, and her teacher, Sarah Shah, completed the Social Responsiveness Scale, 2nd Edition (SRS-2). The SRS-2 is a screening tool used to help identify social impairments commonly associated with ASD, as well as gauge their severity, as compared to typically developing peers. The SRS-2 provides T-scores across 5 domains of social functioning, as well as an overall score that can be indicative of the degree to which reported social functioning appears consistent with a diagnosis of ASD. Scores have a mean of 50 and a standard deviation of 10. Overall T-scores greater than 76 are considered to be strongly indicative of ASD, whereas scores in the range of 60-75 are considered to be in the mild to  moderate range, and scores below 59 are considered to be in the normal range. Angelie's scores on the SRS-2 are provided in the table below.  SRS-2 Domain T-Score   Parent Teacher Self  Overall 77 46 48  Social Awareness 64 41 44  Social Cognition 81 83 48  Social Communication 25 66 49  Social Motivation 37 49 22  Autistic Mannerisms 29 52 48   Donnae's mother endorsed an overall score in the severe range, with scores in the severe range on the Social Cognition, Social Communication, and Social Motivation subscales of the SRS-2. Tomasina's mother endorsed scores in the mild-moderate range on the Social Awareness and Autistic Mannerisms subscales. This is consistent with results from Golden's previous evaluation in indicating clinically significant characteristics of ASD. However, Lilas and her teacher endorsed scores within the normative range overall and across all subscales of the SRS-2. This suggests that Danyelle may have developed strategies to mask characteristics of ASD as she has matured.   Social Communication Questionnaire (SCQ), Lifetime Form To provide additional input on Kewanda's functioning across settings, her mother completed the Lifetime form of the SCQ. The SCQ is a 40-item measure designed to screen for symptoms of ASD in order to determine whether further evaluation is warranted. Shaley's mother was asked to report about her social behavior when she was between the ages of 16-5, as well as the presence of restricted and repetitive behaviors over the course of her lifetime. Scores above 15 are considered to be highly indicative of ASD and warrant further evaluation.  Dwayne's mother endorsed a score of 19 on the SCQ, indicating that characteristics of ASD have been present since Precilla's early childhood. In terms of social behavior, Bunnie's mother reported that, when she was between the ages of 57-5, she did not use gestures or eye contact for the purpose of reciprocal social engagement, did not  offer to share items of interest with others, did not show interest in similarly age peers or response positively to their approaches, and did not engage in cooperative or imaginative play with similarly aged children. In terms of restricted and repetitive behavior, Mishika's mother reported that she has demonstrated socially inappropriate questions, unusually intense interests, and self-injury. Overall, maternal report is consistent with results from Zetha's 2018 evaluation in indicating the presence of characteristics of ASD beginning in Cynithia's very early childhood.   Adult Self Report for Ages 65-59 (ASR) To provide a general overview of Kinleigh's own perceptions of her behavior, as well as to screen for additional symptomatology, she completed the Adult Self Report (ASR) for Ages 25-59. The ASR provides scores across 6 domains: Depressive Problems, Anxiety Problems, Somatic Problems, Avoidant Personality Problems, AD/HD Problems, and Antisocial Personality Problems. Kathline endorsed scores that fell within the normative range, with the exception of an at-risk score on the Somatic Problems subscale. Lasya reported "often" experiencing body aches and headaches, as well as "sometimes" experiencing nausea, pounding heart, and numbness. This score may be driven by health challenges Chaley and her mother described her experiencing over the past few years. Overall, Cybill's report on the ASR is indicative of minimal self-perception of emotional and behavioral challenges.    Evaluation Summary Behavioral Observations Melisssa was seen in person for testing in August and September of 2023. She was dressed and groomed appropriately for the situation and weather at each visit. During the initial visit, Kecia was present as her mother provided updated background information. She was quiet overall, and was observed to lean back on the couch, at times putting her head in her hands in a manner that suggested she may have been  fatigued. She occasionally added information and/or corrected her mother's responses.   Aleiah was seen in person for cognitive and behavioral testing in early September of 2023. She readily joined the examiner in the evaluation room. During cognitive testing, she demonstrated an attentive and compliant demeanor. Speech was fluid and complex, with a slightly flat intonation, and Deziray rarely made direct eye contact with the examiner. At times, Adaliah's tone of voice sounded slightly irritated, even though her responses and overall demeanor suggested otherwise (for example, she occasionally smiled while using a mildly irritated tone of voice). At the start of testing, Theodosia remarked that she felt tired, but clarified that she is usually tired in the morning and had slept well the night before. She appeared to become more energetic as testing went on, at times commenting on her responses (e.g., "I'll have to look that up when I get home!" when presented with a word definition she found challenging). She attempted all presented items without appearing to become frustrated, and let the examiner know when she did not know the responses to items. She took the option to guess responses when suggested, and was able to move on when she was not able to complete items during the allotted time, suggesting minimal perfectionistic tendencies.   Corissa took a 10-minute break in the waiting room following cognitive testing, and returned to complete the ADOS-2, Module 4. She again demonstrated a pleasant and compliant demeanor,  and was able to complete all presented items. During the initial Construction activity, Mashawn commented that the task felt "awkward," but ultimately completed it without issue. Eye contact was limited overall, and at times Vana turned her head to look away from the examiner. This was to a degree that, on one occasion, the examiner prompted her to look toward testing materials in order to be able to complete  the presented task. Annica commented on sensory experiences on several occasions, including commenting that the examiner's office smelled like peanut butter, remarking "I like how these feel!" when presented with foam blocks, and commenting, "I like this sound," while interacting with a pin art item. She was able to complete all presented items without issue. Overall, behavioral observations suggest that results from this evaluation can be interpreted as an accurate reflection of Belynda's current level of functioning.   Stanford-Binet Intelligence Scales, 5th Edition (SB5) To provide an overview of Zahniya's current level of cognitive functioning, she completed the Stanford-Binet Intelligence Scales, 5th Edition (SB5). The SB5 assesses performance on tasks related to Fluid Reasoning, Knowledge, Quantitative Reasoning, Visual-Spatial Reasoning, and Working Memory across [employer] and Verbal domains of functioning. The SB-5 also provides a Full-Scale IQ (FSIQ) which is calculated using scores on all other core subtests to provide a summary score of cognitive functioning. However, the FSIQ is not considered to be an accurate representation of functioning when the examinee demonstrates a significant discrepancy between domains and/or subtests. Scores are provided as standard scores, which have a mean of 100 and standard deviation of 15. Annamay's scores on the SB5 are provided in the table below.   Stanford-Binet Intelligence Scales, 5th Edition (SB5) Domain Standard Score Scaled Score Percentile 95% Confidence Interval  Nonverbal IQ 78 7 73-85  Verbal IQ 64 1 59-71  Full Scale IQ 70 2 67-75  Fluid Reasoning  88 21 81-97  Nonverbal 9    Verbal  7    Knowledge  72 3 66-82  Nonverbal 3    Verbal 7    Quantitative Reasoning  72 3 66-82  Nonverbal 7    Verbal  3    Visual-Spatial Reasoning  74 4 68-84  Nonverbal  8    Verbal  3    Working Memory  65 1 60-76  Nonverbal  6    Verbal 2     Leeyah's  performance on the SB-5 ranged from the upper reaches of the borderline range (Nonverbal IQ=78) to the significantly below average range (Verbal=64). Scores were largely consistent with those obtained during her 2018 evaluation, although her verbal performance fell moderately below what was obtained at that time. Notably, Evadean demonstrated significant scatter between verbal and nonverbal abilities across the Knowledge, Quantitative Reasoning, Visual-Spatial Reasoning, and Working Memory domains, indicating that the [facility] cannot be considered an accurate reflection of her overall level of functioning, and scores are best interpreted at the domain level. Kynlea's strongest performance occurred on the Fluid Reasoning domain, where she performed in the upper reaches of the below average range and comparably across verbal and nonverbal tasks. This indicates that fluid reasoning skills are an area of relative strength for [place]. On the Knowledge domain, Retal performed significantly more strongly on Verbal than Nonverbal tasks, and in the lower reaches of the borderline range overall. Notably, the Nonverbal subtest required Gwendolyn to provide detailed verbal descriptions of pictures she was presented. Thus, even though pictures were presented visually, the demand on Yuko's verbal ability to describe what she saw may  have impacted her score on this subtest. On the Quantitative Reasoning domain, Anzleigh scored in the lower reaches of the borderline range, and significantly more strongly on Nonverbal than Verbal tasks. Nonverbal tasks on this domain allowed Mychelle to provide responses with one-word or by pointing, whereas Verbal tasks required her to hold information in-mind in order to devise responses to mathematical word problems. Veora performed in the middle of the borderline range on the Visual-Spatial Reasoning subtest, where she performed significantly more strongly on Nonverbal than Verbal tasks. Nonverbal tasks of this  domain required Jacaria to use a set of shapes to create patterns presented in pictures, and thus placed relatively little demand on verbal reasoning or working memory skills. However, verbal tasks of this domain required Delisha to provide verbal directions related to images presented in pictures. Lastly, on the Working Memory domain, Lorana performed in the significantly below average range, and this was her weakest performance across domains of the SB-5. She performed significantly more strongly on nonverbal than verbal working memory tasks. 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  Adaptive Behavior Assessment System, 3rd Edition (ABAS-3):  To provide a measure of Donni's current level of adaptive functioning, her mother completed the Adaptive Behavior Assessment System, 3rd Edition (ABAS-3). The ABAS-3 provides a measure of adaptive functioning across conceptual, social, and practical domains, as well as a general conceptual ability score as a summary measure of overall adaptive functioning. Domain scores are provided as standard scores, which have a mean of 100 and standard deviation of 15. Subdomain scores are provided as scaled scores, which have a mean of 10 and a standard deviation of 3. Aubreigh's scores on the ABAS-3 are provided in the table below.         ABAS-3, Parent Report  Standard Score Percentile Rank Confidence Interval   Scaled Score    Conceptual 64 1 60-68  Communication 3    Functional Academics 1    Self-Direction 5    Social 64 1 59-69  Leisure 1    Social 2    Practical 60 <1 56-64  Sarah Use 2    Home Living 6    Health and Safety 1    Self-Care 4    GAC 60 <1 57-63   Maternal report on the ABAS-3  is indicative of adaptive functioning that falls roughly on-par with [name] verbal reasoning skills, but moderately below what might be expected based on her nonverbal reasoning skills as measured by the SB-5. Cayden's mother endorsed scores in the significantly below average range across all domains of the SB-5. On the Conceptual domain, Ayanah's mother reported that she answers the telephone by saying, "hello," answers simple questions about a story read to her, and calls family or others when she will be late. However, she does not yet control her disappointment when a favorite activity is canceled, state the days of the week in order, or say, "hello," and "goodbye" to others. On the Social domain, Britiany tries a new activity to learn about something new and personally makes or buys gifts for family members, but does not yet have one or more friends or attend fun activities at another's home. Lastly, on the Practical domain, Gretna's mother reported that she finds specific areas  in stores and businesses, uses a clothes dryer, takes her temperature with a thermometer when feeling sick, and gets out of bed on time by herself. However, she does not yet button her own clothing, consistently show caution around dangerous items, pour liquid from large containers into cups without spilling, or order her own meals when eating out. Overall, maternal report indicates that Libra performs day-to-day tasks at a level roughly on par with her verbal reasoning ability, but moderately below what might be expected based on her nonverbal reasoning skills as measured by the SB-5.  Autism Diagnostic Observation Schedule, Module 4 (ADOS-2) To assess specifically for characteristics of autism spectrum disorder (ASD), the Autism Diagnostic Observation System, 2nd Edition (ADOS-2) was completed to observe Jolean's social and behavioral functioning. The ADOS-2 is a semi-structured interaction designed to allow the examiner to observe for  behaviors that are consistent with an ASD diagnosis across two domains: Social Affect (which includes nonverbal and reciprocal social functioning) and Restricted and Repetitive Behavior (which includes sensory interests, stereotyped language and motor movements, as well as excessive interests and repetitive behaviors). The ADOS-2 consists of four modules of activities, to be selected based on the individuals' language ability and developmental level. Based on her language ability, Zadaya completed Module 4 of the ADOS-2. On Module 4, which requires fluent speech and is designed for older adolescents/adults, the original algorithm includes a threshold for the Communication and Social Interaction domains, as well as for the combined Communication and Social Interaction sections that must be met for a classification of autism spectrum disorder. However, according to a recently published article, an updated algorithm was created for the Module 4 of the ADOS. Specifically, per the article: "Clinically, the Module 4 revisions yield scores that provide a more accurate summary of ASD symptoms, with an algorithm that is more closely aligned with DSM-5 criteria than the original algorithm. It also affords good sensitivity and improved specificity compared to the original Module 4 algorithm. Although it is always recommended that the ADOS be used as one source of information in a diagnostic battery, good specificity is particularly important in the assessment of adults, for whom parents are not always available to provide the comprehensive developmental history that is often helpful in making differential diagnoses." ([place], C. (2014). The Autism Diagnostic Observations Schedule, Module 4: Revised Algorithm and Standardized Severity Scores. Journal of Autism and Developmental Disorders; 44(8), [phone].). As such, in addition to be being scored on the original algorithm, scores on the revised algorithm were also  calculated. Abrielle met criteria above the autism cut-off on both the original and revised algorithms of the ADOS-2.  Social Affect: Isyss demonstrated an array of strengths in terms of her social affect on the ADOS-2. She shared information about herself on several occasions, responded readily to questions from the examiner, and was able to provide a sequential description of brushing her teeth. However, she also demonstrated several characteristics of ASD. Kitana was not observed to engage in reciprocal conversation with the examiner, with exchanges consisting of relatively minimal responses to the examiner's bids for conversation (for example, responding, "oh" to the examiner's comments during a conversation about pets). She did not ask the examiner for information about herself or otherwise express interest in the examiner's thoughts or experiences during the ADOS-2 administration. Although she occasionally demonstrated spontaneous emphatic gestures, she rarely demonstrated descriptive gestures when not directly prompted to do so. Her eye contact was limited overall, and although she occasionally smiled in response to  her own comments and actions, she did not share enjoyment with the examiner during the ADOS-2. She also demonstrated minimal insight into the emotional states of others, as well as minimal understanding of her own role in relationships.  Restricted and Repetitive Behavior: Shante demonstrated possible sensory interests on several occasions during the ADOS-2, including commenting on the texture of two different items (for example, commenting, "I like how these feel!" when presented with a set of foam blocks during the Construction task), as well as on the smell of the examiner's office ("It smells like peanut butter in here!") and the sound of a pin art toy ("I like the sound this makes!"). She also struggled to use a set of items to devise a creative story, and ultimately lined them up on the table  while describing her story without using the items as agents. Makaylin commented in response to this task, "I'm not too imaginative." Lastly, she demonstrated stereotyped speech by repeatedly referring to a game of chess featured in a book as "chest."   Summary and Recommendations In order to meet criteria for ASD, individuals must demonstrate impaired functioning across two domains: Reciprocal Social Interaction/Social Affect and Restricted and Repetitive Behaviors. Additionally, individuals must demonstrate a history of impairment across these two domains beginning in early childhood, and this impairment must not be better explained by a different diagnosis.  Results from the present evaluation are consistent with results from [facility] 2018 evaluation in indicating that Mason demonstrates characteristics of ASD that cause impairment across settings, and have been present since her early childhood. These include inconsistent use of nonverbal behaviors for the purpose of reciprocal social interaction, difficulty perceiving and responding appropriately to social cues and difficulty forming and maintaining reciprocal relationships, as well as unusually intense interests, sensory interests, and difficulty with changes in routine. Notably, teacher report across the present evaluation and Jode's 2018 evaluation suggests that she may be able to mask these characteristics in some settings. However, during both the present evaluation and her 2018 evaluation, Lyris was observed to demonstrate clinically significant characteristics of ASD during the in-person evaluation to a degree that she scored above the clinically significant cut-off for ASD on the ADOS-2. This suggests that characteristics of ASD remain present in settings outside the home, despite Sherran's ability to mask some of them in specific situations. Taken together, Rebecah continues to meet diagnostic criteria for autism spectrum disorder. 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 Recommendations based upon results from this evaluation are provided below.   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 Madalena may benefit from continued opportunities to learn and practice adaptive functioning skills. She may especially benefit from breaking larger tasks down into steps, as well as incorporating written lists and reminders. She may benefit from occupational therapy to support her adaptive functioning ability, such as is available through [place] at [phone]. Jen may benefit from continued participation in individual cognitive-behavioral therapy to help her continue to develop her emotion regulation strategies, coping skills, and social skills. Roselle may benefit from services through the [employer] for Disabled Adults (CAP/DA) in order to maintain active participation in the Sarah.  Sherion may benefit from contacting Vocational Rehabilitation to look into options for job support, including transportation assistance and/or job coaching (InternationalWords.com.cy).  [facility] offers a range of programs for adults with ASD that may be a fit for [place]. She and her parents may especially wish to consider the [facility] supported employment program (hyooman.com), as well as whether she may be a candidate for the [employer] and [facility]; NotSimilar.no).  [name] and her  family may benefit from planning for Naiomi's care should her parents become unable to care for her, or if she otherwise requires additional support. They may especially benefit from contacting a resource specialist at the [facility] ([place]) to help them think through plans for Makynleigh's future. They can access a resource specialist by going through the [place] website at: https://www.autismsociety-[place].org/talk-with-a-specialist/ . Adean may benefit from continued opportunities to connect with other neurodiverse individuals, such as through participation in meet-up groups including the [place] [employer] group available through [place] (BankingRep.com.au), the [employer] (https://www.autismsociety-[place].org/wp-content/uploads/IGNITE-Application-Packet-2017.pdf) or groups organized by the [employer] in [facility] ([email or web address].at).  It was a pleasure to work with [name]. Should you have any questions or require further assistance, please do not hesitate to contact me.  Sincerely,   Anet Logsdon L. Gaynell Face, Ph.D., HSP-P Licensed Psychologist  [employer] [phone]          [facility] [place] offers a range of resources to individuals with ASD and their families, including the opportunity to speak with a specialist to help coordinate care for newly diagnosed individuals. Their website can be found at: https://www.autismsociety-[place].org/# . To be placed in contact with a specialist, go to: https://www.[email or web address]-[place].org/talk-with-[email or web address]-specialist/  [facility]: The [facility] operates out of 7 regional centers across [place] offering intervention services for children and adults with ASD, as well as trainings for parents and educators working with individuals with ASD. The contact information for the [facility] is: [phone], and  their website can be found at: TelephoneAffiliates.pl.  [facility]: The [facility], located in [place], [place], offers a range of research and intervention opportunities for individuals with ASD. Their website can be found at: [email or web address]://www.davila.[email or web address]/. For clinical services, call: [phone]. To learn more about ongoing research projects, contact: [phone] The Organization for Autism Research (OAR) offers an array of resources for individuals with ASD, as well as their teachers and siblings, on their website: https://researchautism.org/resources/.  The [place] ([place]) on ASD offers several free online modules designed to help guide the use of empirically based interventions for individuals with ASD: https://afirm.https://kaiser.com/ Autism [employer]: [employer] is a TEFL teacher aimed at addressing the needs of the autism Sarah. [employer] offers a range of activities and workshops for individuals with ASD and their families, including siblings. Their website is: https://autismunbound.org/ [employer]: The [employer] is a local organization geared toward providing resources and support for individuals with ASD and their families. They offer several social groups for individuals with ASD from childhood into adulthood, including both casual opportunities to socialize as well as social skills training groups. You can contact their office by phone at: [phone]. Their website is: [employer]ishingAward[employer]fi [facility]: [facility] offers educational and behavioral health services for individuals with developmental differences. Their contact information is: [phone]. [facility] is a Hospital doctor to research and service for individuals with ASD and their families. They offer a range  of  resources through their website, including a variety of free tool kits that can be printed out or used electronically. Among these tool kits is a "100 Day Kit," which breaks down important steps to take within the first 100 days of an ASD diagnosis. [facility] tool kits can be found at: https://www.autismspeaks.org/family-services/tool-kitss The [place] website lists several helpful resources for individuals diagnosed with ASD and their families. Their services include a resource specialist who can help connect families with helpful resources, as well as opportunities to connect with other families affected by an ASD diagnosis. The website can be found at: http://www.garcia-cox.com/ [facility] [place] offers services for families of children with special needs. Their website can be found at: BuffaloWindows.se. The [facility]) offers a range of services for individuals diagnosed with developmental disabilities and their families, including early intervention services for children under the age of 48 and trainings for parents and teachers. Their website can be found at: https://www.[facility]parent[facility].org/training-and-events-calendar/ Individuals with ASD may be eligible for Medicaid services to help cover the cost of interventions. You may wish to contact the Local Management Entity-Managed Care Organization [facility]) for [place] at: [phone] to see what services [name] might qualify for.     Reading List About Autism Autism Spectrum Disorders: The Complete Guide to Understanding Autism, Asperger's Syndrome, Pervasive Developmental Disorder, and Other ASDs by Sammuel Hines  The Hidden Curriculum: Practical Solutions for Understanding Unstated Rules in Social Situations by Gabriel Carina, [place]  Simple Strategies that Work by Gabriel Carina  Discussing Diagnosis with Affected Individual, Family Members, and Friends Parenting Across the Autism Spectrum by Titus Dubin and Gwendolyn Lima Siblings of Children with Autism: A Guide for Families by Nonie Hoyer and [place] and Relative's Guide to Supporting the Family with Autism: How Can I Help? By Gwendolyn Lima  Transition to Adulthood Realizing the College Dream with Autism or Asperger Syndrome by Valli Glance, [name]

## 2021-10-10 ENCOUNTER — Encounter: Payer: Self-pay | Admitting: Adult Health

## 2021-10-10 ENCOUNTER — Ambulatory Visit (INDEPENDENT_AMBULATORY_CARE_PROVIDER_SITE_OTHER): Admitting: Adult Health

## 2021-10-10 DIAGNOSIS — F913 Oppositional defiant disorder: Secondary | ICD-10-CM | POA: Diagnosis not present

## 2021-10-10 DIAGNOSIS — F840 Autistic disorder: Secondary | ICD-10-CM | POA: Diagnosis not present

## 2021-10-10 DIAGNOSIS — F3341 Major depressive disorder, recurrent, in partial remission: Secondary | ICD-10-CM | POA: Diagnosis not present

## 2021-10-10 DIAGNOSIS — F902 Attention-deficit hyperactivity disorder, combined type: Secondary | ICD-10-CM | POA: Diagnosis not present

## 2021-10-10 DIAGNOSIS — F419 Anxiety disorder, unspecified: Secondary | ICD-10-CM

## 2021-10-10 DIAGNOSIS — F32A Depression, unspecified: Secondary | ICD-10-CM

## 2021-10-10 DIAGNOSIS — F418 Other specified anxiety disorders: Secondary | ICD-10-CM

## 2021-10-10 MED ORDER — AMPHETAMINE SULFATE 10 MG PO TABS: ORAL_TABLET | ORAL | 0 refills | Status: DC

## 2021-10-10 MED ORDER — FLUOXETINE HCL 40 MG PO CAPS: 40.0000 mg | ORAL_CAPSULE | Freq: Every day | ORAL | 3 refills | Status: DC

## 2021-10-10 MED ORDER — CLONIDINE HCL ER 0.1 MG PO TB12: 0.2000 mg | ORAL_TABLET | Freq: Two times a day (BID) | ORAL | 1 refills | Status: DC

## 2021-10-10 MED ORDER — BUSPIRONE HCL 10 MG PO TABS: ORAL_TABLET | ORAL | 1 refills | Status: DC

## 2021-10-10 NOTE — Progress Notes (Signed)
Sarah Shah [id number] Jul 13, 2000 21 y.o.  Subjective:   Patient ID:  Sarah Shah is a 21 y.o. (DOB 01-Jul-2000) female.  Chief Complaint: No chief complaint on file.   HPI Sarah Shah presents to the office today for follow-up of MDD, Autism, Adjustment disorder, ODD, GAD, OCD. Accompanied by mother. Mother contributing to interview - history.  Diagnosed with ODD, ADHD, learning disabilities, Autistic - currently testing on a 2nd grade level.  Medical - PCOS, insulin resistance chronic fatigue, iron deficiency.  Describes mood today as "ok". Pleasant. Flat. Mood symptoms - reports depression, anxiety and irritability. Reports trying to "camouflage" her symptoms around others. Reports more worry lately. Mood fluctuates - gets overwhelmed or overstimulated - struggles throughout the day - better at home. Reports occasional outbursts - gets overwhelmed with a lot of emotions. Stating "I feel like I'm doing ok right now". Feels like medications are helpful. Mother feels like she does well for the most part. Stable interest and motivation. Taking medications as prescribed.  Energy levels lower. Active, does not have a regular exercise routine. Walking dog. Enjoys some usual interests and activities. Spending time with family. Has one sister - graduated from [employer]. Has an Aussie doodle "Ruby". Loves animals - has 4 [place] Pigs, ducks, chickens and a cow. 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 Appetite adequate. Weight gain. Lost 50 pounds on Outlook.  Sleeps well most nights. Has a nightly routine. Averages 7 to 8 hours. Focus and concentration stable with medication. Completing tasks. Managing aspects of household. 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 Denies SI or HI.  Denies AH or VH. Denies self harm. Denies substance use.  Seeing therapist - Dr. Raenette Rover Psychologist - [facility].  Previous medication trials:  Vyvanse, Ritalin, Daytrana patch.  [place] Office Visit  from 03/03/2021 in [place] at [facility] Visit from 07/20/2019 in [place] at [facility]  Total GAD-7 Score 7 1      PHQ2-9    [place] Visit from 03/03/2021 in [place] at [facility] Video Visit from 10/28/2020 in [place] from 07/03/2020 in [facility] Office Visit from 07/20/2019 in [place] at [facility] Visit from 03/01/2019 in [place]  PHQ-2 Total Score 4 0 0 2 6  PHQ-9 Total Score 10 -- -- 8 19      Flowsheet Row Admission (Discharged) from 02/17/2021 in [place] ED from 12/29/2020 in [facility] ED from 12/10/2020 in [place] No Risk No Risk No Risk        Review of Systems:  Review of Systems  Musculoskeletal:  Negative for gait problem.  Neurological:  Negative for tremors.  Psychiatric/Behavioral:         Please refer to HPI    Medications: I have reviewed the patient's current medications.  Current Outpatient Medications  Medication Sig Dispense Refill   acetaminophen (TYLENOL) 500 MG tablet Take 1,000 mg by mouth every 8 (eight) hours as needed for moderate pain.     Amphetamine Sulfate (EVEKEO) 10 MG TABS Take 2 tabs in AM, and 2 tabs at lunch. May take 1 tab at 4 PM PRN 150 tablet 0   blood glucose meter kit and supplies KIT Dispense based on patient and insurance preference. Use daily as directed. 1 each 0   busPIRone (BUSPAR) 10 MG tablet 20  mg in the morning, 20 mg at lunch, 10-20 mg at 4p as needed, and can take up to 60 mg total daily 540 tablet 1   cloNIDine HCl (KAPVAY) 0.1 MG TB12 ER tablet Take 2 tablets (0.2 mg total) by mouth 2 (two) times daily. Take 2 tablets by mouth 2 times daily 360 tablet 1   desogestrel-ethinyl estradiol (VIORELE) 0.15-0.02/0.01 MG (21/5) tablet Take 1 tablet by mouth  daily. Please give active pills only. Skip placebo pills. 140 tablet 3   dicyclomine (BENTYL) 20 MG tablet Take 1 tablet (20 mg total) by mouth every 6 (six) hours. (Patient taking differently: Take 20 mg by mouth every 6 (six) hours as needed (stomach pain).) 60 tablet 0   docusate sodium (COLACE) 100 MG capsule Take 100 mg by mouth at bedtime.     ferrous sulfate 325 (65 FE) MG tablet Take 325 mg by mouth at bedtime.     FLUoxetine (PROZAC) 40 MG capsule Take 1 capsule (40 mg total) by mouth daily. TAKE 1 CAPSULE(40 MG) BY MOUTH DAILY 90 capsule 3   omeprazole (PRILOSEC) 20 MG capsule Take 1 capsule (20 mg total) by mouth daily. 30 capsule 3   ondansetron (ZOFRAN ODT) 4 MG disintegrating tablet Take 1 tablet (4 mg total) by mouth every 8 (eight) hours as needed for nausea or vomiting. 20 tablet 0   Topiramate ER (TROKENDI XR) 50 MG CP24 Take 1 capsule by mouth daily. 30 capsule 1   vitamin C (ASCORBIC ACID) 500 MG tablet Take 500 mg by mouth at bedtime.     No current facility-administered medications for this visit.    Medication Side Effects: None  Allergies: No Known Allergies  Past Medical History:  Diagnosis Date   ADHD (attention deficit hyperactivity disorder)    Anxiety    Autism spectrum    Constipated    Constipation    Depression    Development delay    GERD (gastroesophageal reflux disease)    Insulin resistance    Iron deficiency    Learning disability    Obesity    ODD (oppositional defiant disorder)    PCOS (polycystic ovarian syndrome)    PMDD (premenstrual dysphoric disorder)    Pneumonia    3 mos old and 98 mos old   Visual acuity reduced    glasses    Past Medical History, Surgical history, Social history, and Family history were reviewed and updated as appropriate.   Please see review of systems for further details on the patient's review from today.   Objective:   Physical Exam:  LMP 09/29/2021   Physical Exam Constitutional:      General: She  is not in acute distress. Musculoskeletal:        General: No deformity.  Neurological:     Mental Status: She is alert and oriented to person, place, and time.     Coordination: Coordination normal.  Psychiatric:        Attention and Perception: Attention and perception normal. She does not perceive auditory or visual hallucinations.        Mood and Affect: Mood normal. Mood is not anxious or depressed. Affect is not labile, blunt, angry or inappropriate.        Speech: Speech normal.        Behavior: Behavior normal.        Thought Content: Thought content normal. Thought content is not paranoid or delusional. Thought content does not include homicidal or suicidal ideation. Thought content  does not include homicidal or suicidal plan.        Cognition and Memory: Cognition and memory normal.        Judgment: Judgment normal.     Comments: Insight intact     Lab Review:     Component Value Date/Time   NA 140 04/16/2021 0748   K 4.1 04/16/2021 0748   CL 103 04/16/2021 0748   CO2 22 04/16/2021 0748   GLUCOSE 84 04/16/2021 0748   GLUCOSE 90 12/29/2020 2102   BUN 8 04/16/2021 0748   CREATININE 0.66 04/16/2021 0748   CREATININE 0.71 07/26/2020 1022   CREATININE 0.66 09/12/2018 0000   CALCIUM 10.0 04/16/2021 0748   PROT 7.1 04/16/2021 0748   ALBUMIN 4.2 04/16/2021 0748   AST 9 04/16/2021 0748   AST 10 (L) 07/26/2020 1022   ALT 10 04/16/2021 0748   ALT 11 07/26/2020 1022   ALKPHOS 101 04/16/2021 0748   BILITOT <0.2 04/16/2021 0748   BILITOT <0.2 (L) 07/26/2020 1022   GFRNONAA >60 12/29/2020 2102   GFRNONAA >60 07/26/2020 1022   GFRAA CANCELED 07/18/2019 0939       Component Value Date/Time   WBC 8.2 05/29/2021 1531   WBC 8.3 01/17/2021 1604   RBC 4.95 05/29/2021 1531   RBC 4.88 05/29/2021 1531   HGB 14.4 05/29/2021 1531   HGB 14.0 12/16/2020 1703   HCT 40.4 05/29/2021 1531   HCT 39.8 12/16/2020 1703   PLT 312 05/29/2021 1531   PLT 372 12/16/2020 1703   MCV 82.8  05/29/2021 1531   MCV 85 12/16/2020 1703   MCH 29.5 05/29/2021 1531   MCHC 35.6 05/29/2021 1531   RDW 12.1 05/29/2021 1531   RDW 13.8 12/16/2020 1703   LYMPHSABS 2.1 05/29/2021 1531   LYMPHSABS 0.9 12/16/2020 1703   MONOABS 0.7 05/29/2021 1531   EOSABS 0.1 05/29/2021 1531   EOSABS 0.0 12/16/2020 1703   BASOSABS 0.0 05/29/2021 1531   BASOSABS 0.0 12/16/2020 1703    No results found for: "POCLITH", "LITHIUM"   No results found for: "PHENYTOIN", "PHENOBARB", "VALPROATE", "CBMZ"   .res Assessment: Plan:    Plan:  PDMP reviewed  Prozac 2m daily Kapvay 0.153m2 BID Buspar 1065m 2 - 3 times a day Evekeo - Take 2 tabs in AM, and 2 tabs at lunch. May take 1 tab at 4 PM PRN   RTC 3 months  Patient advised to contact office with any questions, adverse effects, or acute worsening in signs and symptoms.   Discussed potential benefits, risks, and side effects of stimulants with patient to include increased heart rate, palpitations, insomnia, increased anxiety, increased irritability, or decreased appetite.  Instructed patient to contact office if experiencing any significant tolerability issues.   There are no diagnoses linked to this encounter.   Please see After Visit Summary for patient specific instructions.  Future Appointments  Date Time Provider DepSugar Land0/10/2021  1:00 PM LeuHarland DingwallhD [facility] None  12/01/2021  9:15 AM BadLelon HuhD [facility]  12/17/2021  2:30 PM [facility] LAB [facility] None  12/17/2021  3:00 PM [facility]son [facility]lickD [facility]    No orders of the defined types were placed in this encounter.   -------------------------------

## 2021-10-13 MED ORDER — AMPHETAMINE SULFATE 10 MG PO TABS: ORAL_TABLET | ORAL | 0 refills | Status: DC

## 2021-10-13 NOTE — Addendum Note (Signed)
Addended by: Aloha Gell on: 10/13/2021 08:02 AM   Modules accepted: Orders

## 2021-10-17 ENCOUNTER — Ambulatory Visit: Admitting: Clinical

## 2021-10-21 ENCOUNTER — Ambulatory Visit (INDEPENDENT_AMBULATORY_CARE_PROVIDER_SITE_OTHER): Admitting: Clinical

## 2021-10-21 DIAGNOSIS — F4320 Adjustment disorder, unspecified: Secondary | ICD-10-CM

## 2021-10-21 DIAGNOSIS — F840 Autistic disorder: Secondary | ICD-10-CM
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
## 2021-10-29 ENCOUNTER — Encounter (HOSPITAL_COMMUNITY): Payer: Self-pay | Admitting: Emergency Medicine

## 2021-10-29 ENCOUNTER — Emergency Department (HOSPITAL_COMMUNITY)
Admission: EM | Admit: 2021-10-29 | Discharge: 2021-10-29 | Disposition: A | Discharge status: Home / Self Care | Attending: Emergency Medicine | Admitting: Emergency Medicine

## 2021-10-29 ENCOUNTER — Other Ambulatory Visit: Payer: Self-pay

## 2021-10-29 ENCOUNTER — Emergency Department (HOSPITAL_COMMUNITY)

## 2021-10-29 DIAGNOSIS — K219 Gastro-esophageal reflux disease without esophagitis: Secondary | ICD-10-CM | POA: Diagnosis not present

## 2021-10-29 DIAGNOSIS — R7401 Elevation of levels of liver transaminase levels: Secondary | ICD-10-CM | POA: Diagnosis not present

## 2021-10-29 DIAGNOSIS — R079 Chest pain, unspecified: Secondary | ICD-10-CM | POA: Diagnosis present

## 2021-10-29 LAB — CBC WITH DIFFERENTIAL/PLATELET
Abs Immature Granulocytes: 0.03 10*3/uL (ref 0.00–0.07)
Basophils Absolute: 0 10*3/uL (ref 0.0–0.1)
Basophils Relative: 0 %
Eosinophils Absolute: 0.1 10*3/uL (ref 0.0–0.5)
Eosinophils Relative: 1 %
HCT: 43.8 % (ref 36.0–46.0)
Hemoglobin: 15.3 g/dL — ABNORMAL HIGH (ref 12.0–15.0)
Immature Granulocytes: 0 %
Lymphocytes Relative: 19 %
Lymphs Abs: 1.4 10*3/uL (ref 0.7–4.0)
MCH: 29.4 pg (ref 26.0–34.0)
MCHC: 34.9 g/dL (ref 30.0–36.0)
MCV: 84.2 fL (ref 80.0–100.0)
Monocytes Absolute: 0.8 10*3/uL (ref 0.1–1.0)
Monocytes Relative: 11 %
Neutro Abs: 4.9 10*3/uL (ref 1.7–7.7)
Neutrophils Relative %: 69 %
Platelets: 301 10*3/uL (ref 150–400)
RBC: 5.2 MIL/uL — ABNORMAL HIGH (ref 3.87–5.11)
RDW: 12.1 % (ref 11.5–15.5)
WBC: 7.1 10*3/uL (ref 4.0–10.5)
nRBC: 0 % (ref 0.0–0.2)

## 2021-10-29 LAB — COMPREHENSIVE METABOLIC PANEL
ALT: 206 U/L — ABNORMAL HIGH (ref 0–44)
AST: 274 U/L — ABNORMAL HIGH (ref 15–41)
Albumin: 3.5 g/dL (ref 3.5–5.0)
Alkaline Phosphatase: 109 U/L (ref 38–126)
Anion gap: 13 (ref 5–15)
BUN: 7 mg/dL (ref 6–20)
CO2: 20 mmol/L — ABNORMAL LOW (ref 22–32)
Calcium: 9.4 mg/dL (ref 8.9–10.3)
Chloride: 107 mmol/L (ref 98–111)
Creatinine, Ser: 0.77 mg/dL (ref 0.44–1.00)
GFR, Estimated: >60 mL/min (ref >60)
Glucose, Bld: 110 mg/dL — ABNORMAL HIGH (ref 70–99)
Potassium: 3.6 mmol/L (ref 3.5–5.1)
Sodium: 140 mmol/L (ref 135–145)
Total Bilirubin: 1.4 mg/dL — ABNORMAL HIGH (ref 0.3–1.2)
Total Protein: 7.2 g/dL (ref 6.5–8.1)

## 2021-10-29 LAB — TROPONIN I (HIGH SENSITIVITY)
Troponin I (High Sensitivity): <2 ng/L (ref <18)
Troponin I (High Sensitivity): <2 ng/L (ref <18)

## 2021-10-29 LAB — I-STAT BETA HCG BLOOD, ED (MC, WL, AP ONLY): I-stat hCG, quantitative: <5 m[IU]/mL (ref <5)

## 2021-10-29 LAB — LIPASE, BLOOD: Lipase: 35 U/L (ref 11–51)

## 2021-10-29 MED ORDER — SODIUM CHLORIDE 0.9 % IV BOLUS: 500.0000 mL | Freq: Once | INTRAVENOUS | Status: DC

## 2021-10-29 MED ORDER — LIDOCAINE VISCOUS HCL 2 % MT SOLN
15.0000 mL | Freq: Once | OROMUCOSAL | Status: AC
  Administered 2021-10-29: 15 mL via ORAL
  Filled 2021-10-29: qty 15

## 2021-10-29 MED ORDER — ALUM & MAG HYDROXIDE-SIMETH 200-200-20 MG/5ML PO SUSP
30.0000 mL | Freq: Once | ORAL | Status: AC
  Administered 2021-10-29: 30 mL via ORAL
  Filled 2021-10-29: qty 30

## 2021-10-29 MED ORDER — ONDANSETRON HCL 4 MG PO TABS: 4.0000 mg | ORAL_TABLET | Freq: Four times a day (QID) | ORAL | 0 refills | Status: DC

## 2021-10-29 MED ORDER — FAMOTIDINE IN NACL 20-0.9 MG/50ML-% IV SOLN
20.0000 mg | Freq: Once | INTRAVENOUS | Status: DC
  Filled 2021-10-29: qty 50

## 2021-10-29 NOTE — ED Provider Triage Note (Signed)
Emergency Medicine Provider Triage Evaluation Note  Jeniya Flannigan , a 21 y.o. female  was evaluated in triage.  Pt complains of central chest pain and epigastric pain that feels like heavy pressure and "a brick sitting on my chest" since Friday.  Approximately 2 weeks ago had decrease in her dosage of omeprazole from 40 mg daily to 20 mg daily.  Has been taking 40 mg of omeprazole for the last few days as well as Zofran without significant improvement in her symptomatology.  Also endorses shortness of breath, worsening of pain with deep inspiration no history of cardiac or pulmonary disease in the past.  Patient does have history of autism and is accompanied by her mother at the bedside.  Review of Systems  Positive: As above Negative: As above  Physical Exam  BP 139/86 (BP Location: Right Wrist)   Pulse 71   Temp 98.4 F (36.9 C) (Oral)   Resp 18   LMP 09/29/2021   SpO2 100%  Gen:   Awake, no distress   Resp:  Normal effort  MSK:   Moves extremities without difficulty  Other:  RRR no m/r/g, lungs CTAB. Ttp over the epigastrium and LUQ, no lower abdominal TTP.   Medical Decision Making  Medically screening exam initiated at 6:08 AM.  Appropriate orders placed.  [name] was informed that the remainder of the evaluation will be completed by another provider, this initial triage assessment does not replace that evaluation, and the importance of remaining in the ED until their evaluation is complete.  This chart was dictated using voice recognition software, Dragon. Despite the best efforts of this provider to proofread and correct errors, errors may still occur which can change documentation meaning.    Emeline Darling, PA-C 10/29/21 [phone]

## 2021-10-29 NOTE — Discharge Instructions (Signed)
Return for any problem.  Continue omeprazole as instructed.  Use Maalox as needed.  Your liver function tests were mildly elevated on today's testing.  Ultrasound of your gallbladder and liver was without significant abnormality.  It is important to obtain repeat liver function testing within the next 1 to 2 weeks.  If you develop recurrent pain, right upper quadrant pain, nausea, vomiting, fevers you should return to the ED for evaluation.

## 2021-10-29 NOTE — ED Provider Notes (Signed)
[facility] EMERGENCY DEPARTMENT Provider Note   CSN: [id number] Arrival date & time: 10/29/21  0530     History  Chief Complaint  Patient presents with   Chest Pain    Sarah Shah is a 21 y.o. female.  21 year old female with prior medical history as detailed below presents with her mother with complaint of persistent epigastric discomfort.  This is been ongoing for the last 3 to 4 days.  Patient with history of GERD and does take omeprazole daily.  Patient with recent increase of omeprazole given persistent GERD symptoms.  Patient with increased symptoms of GERD over the last several days including having a bad taste in her mouth and persistent epigastric burning discomfort.    The history is provided by the patient, a relative and medical records.       Home Medications Prior to Admission medications   Medication Sig Start Date End Date Taking? Authorizing Provider  ondansetron (ZOFRAN) 4 MG tablet Take 1 tablet (4 mg total) by mouth every 6 (six) hours. 10/29/21  Yes Malikhi Ogan, Wallis Bamberg, MD  Vitamin D, Ergocalciferol, (DRISDOL) 1.25 MG (50000 UNIT) CAPS capsule Take 50,000 Units by mouth once a week. 10/16/21  Yes [provider]  acetaminophen (TYLENOL) 500 MG tablet Take 1,000 mg by mouth every 8 (eight) hours as needed for moderate pain.    [provider]  Amphetamine Sulfate (EVEKEO) 10 MG TABS Take 2 tabs in AM, and 2 tabs at lunch. May take 1 tab at 4 PM PRN Patient taking differently: Take 10-20 mg by mouth See admin instructions. 20 mg in the morning, 20 mg in the afternoon. May take an additional 10 mg at 1600 as needed for concentration 10/13/21   Mozingo, Berdie Ogren, NP  blood glucose meter kit and supplies KIT Dispense based on patient and insurance preference. Use daily as directed. 12/17/20   Briscoe Deutscher, DO  busPIRone (BUSPAR) 10 MG tablet 20 mg in the morning, 20 mg at lunch, 10-20 mg at 4p as needed, and can  take up to 60 mg total daily Patient taking differently: Take 10-20 mg by mouth See admin instructions. 20 mg in the morning, 20 mg at lunch, 10-20 mg at 1600 as needed for anxiety. 10/10/21   Mozingo, Berdie Ogren, NP  cloNIDine HCl (KAPVAY) 0.1 MG TB12 ER tablet Take 2 tablets (0.2 mg total) by mouth 2 (two) times daily. Take 2 tablets by mouth 2 times daily Patient taking differently: Take 0.2 mg by mouth 2 (two) times daily. 10/10/21   Mozingo, Berdie Ogren, NP  desogestrel-ethinyl estradiol (VIORELE) 0.15-0.02/0.01 MG (21/5) tablet Take 1 tablet by mouth daily. Please give active pills only. Skip placebo pills. 05/28/21   Lelon Huh, MD  dicyclomine (BENTYL) 20 MG tablet Take 1 tablet (20 mg total) by mouth every 6 (six) hours. Patient taking differently: Take 20 mg by mouth every 6 (six) hours as needed (stomach pain). 01/30/21   Briscoe Deutscher, DO  docusate sodium (COLACE) 100 MG capsule Take 100 mg by mouth at bedtime.    [provider]  ferrous sulfate 325 (65 FE) MG tablet Take 325 mg by mouth at bedtime.    [provider]  FLUoxetine (PROZAC) 40 MG capsule Take 1 capsule (40 mg total) by mouth daily. TAKE 1 CAPSULE(40 MG) BY MOUTH DAILY Patient taking differently: Take 40 mg by mouth daily. 10/10/21   Mozingo, Berdie Ogren, NP  omeprazole (PRILOSEC) 20 MG capsule Take 1 capsule (20  mg total) by mouth daily. 10/03/21   Waunita Schooner, MD  ondansetron (ZOFRAN ODT) 4 MG disintegrating tablet Take 1 tablet (4 mg total) by mouth every 8 (eight) hours as needed for nausea or vomiting. 12/31/20   Briscoe Deutscher, DO  Topiramate ER (TROKENDI XR) 50 MG CP24 Take 1 capsule by mouth daily. Patient taking differently: Take 50 mg by mouth daily. 04/16/21   Everardo Pacific, FNP  vitamin C (ASCORBIC ACID) 500 MG tablet Take 500 mg by mouth at bedtime.    [provider]      Allergies    Patient has no known allergies.    Review of Systems   Review of Systems   All other systems reviewed and are negative.   Physical Exam Updated Vital Signs BP 114/69   Pulse 62   Temp 98.1 F (36.7 C)   Resp 19   LMP 09/29/2021   SpO2 100%  Physical Exam Vitals and nursing note reviewed.  Constitutional:      General: She is not in acute distress.    Appearance: Normal appearance. She is well-developed.  HENT:     Head: Normocephalic and atraumatic.  Eyes:     Conjunctiva/sclera: Conjunctivae normal.     Pupils: Pupils are equal, round, and reactive to light.  Cardiovascular:     Rate and Rhythm: Normal rate and regular rhythm.     Heart sounds: Normal heart sounds.  Pulmonary:     Effort: Pulmonary effort is normal. No respiratory distress.     Breath sounds: Normal breath sounds.  Abdominal:     General: There is no distension.     Palpations: Abdomen is soft.     Tenderness: There is no abdominal tenderness.  Musculoskeletal:        General: No deformity. Normal range of motion.     Cervical back: Normal range of motion and neck supple.  Skin:    General: Skin is warm and dry.  Neurological:     General: No focal deficit present.     Mental Status: She is alert and oriented to person, place, and time.     ED Results / Procedures / Treatments   Labs (all labs ordered are listed, but only abnormal results are displayed) Labs Reviewed  CBC WITH DIFFERENTIAL/PLATELET - Abnormal; Notable for the following components:      Result Value   RBC 5.20 (*)    Hemoglobin 15.3 (*)    All other components within normal limits  COMPREHENSIVE METABOLIC PANEL - Abnormal; Notable for the following components:   CO2 20 (*)    Glucose, Bld 110 (*)    AST 274 (*)    ALT 206 (*)    Total Bilirubin 1.4 (*)    All other components within normal limits  LIPASE, BLOOD  I-STAT BETA HCG BLOOD, ED ([facility], [facility], [facility] ONLY)  TROPONIN I (HIGH SENSITIVITY)  TROPONIN I (HIGH SENSITIVITY)    EKG EKG Interpretation  Date/Time:  Wednesday October 29 2021  05:26:36 EDT Ventricular Rate:  81 PR Interval:  146 QRS Duration: 72 QT Interval:  416 QTC Calculation: 483 R Axis:   31 Text Interpretation: Sinus rhythm with occasional Premature ventricular complexes Cannot rule out Anterior infarct , age undetermined Abnormal ECG Confirmed by Ripley Fraise [phone]) on 10/29/2021 5:46:43 AM  Radiology US Abdomen Limited RUQ (LIVER/GB)  Result Date: 10/29/2021 CLINICAL DATA:  Acute right upper quadrant abdominal pain. EXAM: ULTRASOUND ABDOMEN LIMITED RIGHT UPPER QUADRANT COMPARISON:  April 28, 2019.  December 29, 2020. FINDINGS: Gallbladder: No gallstones or wall thickening visualized. No sonographic Murphy sign noted by sonographer. Common bile duct: Diameter: 3 mm which is within normal limits. Liver: No focal lesion identified. Within normal limits in parenchymal echogenicity. Portal vein is patent on color Doppler imaging with normal direction of blood flow towards the liver. Other: None. IMPRESSION: No definite abnormality seen in the right upper quadrant of the abdomen. Electronically Signed   By: Marijo Conception M.D.   On: 10/29/2021 12:58   DG Chest 2 View  Result Date: 10/29/2021 CLINICAL DATA:  21 year old female with history of chest pain. EXAM: CHEST - 2 VIEW COMPARISON:  Chest x-ray 10/14/2020. FINDINGS: Lung volumes are low. No consolidative airspace disease. No pleural effusions. No pneumothorax. No pulmonary nodule or mass noted. Pulmonary vasculature and the cardiomediastinal silhouette are within normal limits. IMPRESSION: 1. Low lung volumes without radiographic evidence of acute cardiopulmonary disease. Electronically Signed   By: Vinnie Langton M.D.   On: 10/29/2021 06:29    Procedures Procedures    Medications Ordered in ED Medications  sodium chloride 0.9 % bolus 500 mL (has no administration in time range)  famotidine (PEPCID) IVPB 20 mg premix (has no administration in time range)  alum & mag hydroxide-simeth  (MAALOX/MYLANTA) 200-200-20 MG/5ML suspension 30 mL (30 mLs Oral Given 10/29/21 1245)    And  lidocaine (XYLOCAINE) 2 % viscous mouth solution 15 mL (15 mLs Oral Given 10/29/21 1245)    ED Course/ Medical Decision Making/ A&P                           Medical Decision Making Amount and/or Complexity of Data Reviewed Radiology: ordered.  Risk OTC drugs. Prescription drug management.    Medical Screen Complete  This patient presented to the ED with complaint of epigastric discomfort.  This complaint involves an extensive number of treatment options. The initial differential diagnosis includes, but is not limited to, GERD, metabolic abnormality, etc.  This presentation is:   , Acute, Chronic, Self-Limited, Previously Undiagnosed, Uncertain Prognosis, Complicated, Systemic Symptoms, and Threat to Life/Bodily Function  Patient with longstanding history of GERD.  Patient presents now with symptoms consistent with worsening of same.  Screening labs obtained are on the whole without significant abnormality.  Patient feels significantly improved after Maalox and viscous lidocaine taken orally.  Patient was a difficult IV stick.  Patient declines additional attempts at IV access and IV Pepcid after multiple attempts.  Labs did reveal mildly elevated ALT and AST and total bili.  Right upper quadrant is without tenderness.  Ultrasound of the right upper quadrant does not show acute pathology.  Patient and patient's mother understand need for close outpatient follow-up.  They both desire discharge.  They are advised to closely follow-up with her already established GI care and/or PCP.  Patient should have recheck of her LFTs within the next 1 to 2 weeks.  The patient does understand if she develops right upper quadrant pain, fever, nausea, vomiting, jaundice she should return immediately to the ED for evaluation.  Additional history obtained:  Additional history obtained from  [facility] External records from outside sources obtained and reviewed including prior ED visits and prior Inpatient records.    Lab Tests:  I ordered and personally interpreted labs.  The pertinent results include: CBC, CMP, lipase, troponin, hCG   Imaging Studies ordered:  I ordered imaging studies including chest x-ray, right upper quadrant ultrasound  I independently visualized and interpreted obtained imaging which showed NAD I agree with the radiologist interpretation.   Cardiac Monitoring:  The patient was maintained on a cardiac monitor.  I personally viewed and interpreted the cardiac monitor which showed an underlying rhythm of: NSR   Medicines ordered:  I ordered medication including Maalox for GERD Reevaluation of the patient after these medicines showed that the patient: improved   Problem List / ED Course:  GERD, transaminitis   Reevaluation:  After the interventions noted above, I reevaluated the patient and found that they have: improved  Disposition:  After consideration of the diagnostic results and the patients response to treatment, I feel that the patent would benefit from close outpatient follow-up.          Final Clinical Impression(s) / ED Diagnoses Final diagnoses:  Gastroesophageal reflux disease, unspecified whether esophagitis present  Transaminitis    Rx / DC Orders ED Discharge Orders          Ordered    ondansetron (ZOFRAN) 4 MG tablet  Every 6 hours        10/29/21 1413              Valarie Merino, MD 10/29/21 1614

## 2021-10-29 NOTE — ED Triage Notes (Addendum)
Patient having episodes of chest pain on and off for the last few days.  Patient has been taking some omeprazole and zofran to help with the chest pain.  She stated to family that her "stomach feels like it is going to explode".  Patient with history of autism.  Mother at bedside.

## 2021-11-03 ENCOUNTER — Telehealth: Payer: Self-pay | Admitting: Family Medicine

## 2021-11-03 NOTE — Telephone Encounter (Signed)
Pt can make hospital f/u with me when next available and she can keep toc in December.

## 2021-11-03 NOTE — Telephone Encounter (Signed)
Patient was in ER last Wednesday,they suggested for her to have her liver function labs repeated with in a week per hospital. Patient was a  a patent of codys but sheS not set up for TOC until the end of December with tabitha,mom would like to know can she come in and get this test repeated before then?

## 2021-11-04 NOTE — Telephone Encounter (Signed)
Stay on PPI Follow-up liver tests with PCP Routine GI appointment with one of our advanced practitioners

## 2021-11-04 NOTE — Telephone Encounter (Signed)
Pt last saw Dr Henrene Pastor for endo colon on 02/17/21.  Will send to his nurse

## 2021-11-05 ENCOUNTER — Ambulatory Visit (INDEPENDENT_AMBULATORY_CARE_PROVIDER_SITE_OTHER): Admitting: Nurse Practitioner

## 2021-11-05 ENCOUNTER — Other Ambulatory Visit (INDEPENDENT_AMBULATORY_CARE_PROVIDER_SITE_OTHER)

## 2021-11-05 ENCOUNTER — Encounter: Payer: Self-pay | Admitting: Nurse Practitioner

## 2021-11-05 VITALS — BP 100/74 | HR 98 | Ht 65.0 in | Wt 315.2 lb

## 2021-11-05 DIAGNOSIS — R7989 Other specified abnormal findings of blood chemistry: Secondary | ICD-10-CM | POA: Diagnosis not present

## 2021-11-05 DIAGNOSIS — K219 Gastro-esophageal reflux disease without esophagitis: Secondary | ICD-10-CM

## 2021-11-05 LAB — HEPATIC FUNCTION PANEL
ALT: 68 U/L — ABNORMAL HIGH (ref 0–35)
AST: 16 U/L (ref 0–37)
Albumin: 3.9 g/dL (ref 3.5–5.2)
Alkaline Phosphatase: 126 U/L — ABNORMAL HIGH (ref 39–117)
Bilirubin, Direct: 0.1 mg/dL (ref 0.0–0.3)
Total Bilirubin: 0.4 mg/dL (ref 0.2–1.2)
Total Protein: 7.4 g/dL (ref 6.0–8.3)

## 2021-11-05 MED ORDER — SUCRALFATE 1 G PO TABS: 1.0000 g | ORAL_TABLET | Freq: Three times a day (TID) | ORAL | 0 refills | Status: DC

## 2021-11-05 NOTE — Progress Notes (Signed)
Chief Complaint:  upper abdominal pain, abnormal liver tests   Assessment &  Plan    # 21 yo female with 10 days history of epigastric pain / RUQ radiating through to right back with associated nausea. Symptoms worse after eating.  Sounds hepatobiliary in nature and she does have a new elevation in liver enzymes with mild hyperbilirubinemia. However, RUQ [place] is normal. Specifically no gallstones, gallbladder wall thickening or biliary duct dilation.  Upper abdominal pain and liver test abnormalities may be unrelated. Liver tests abnormalities may also be related to COVID which she had 4 weeks ago  Continue PPI will also give short time trial of carafate.  Repeat liver chemistries today.    # GERD,  recurrent pyrosis after PPI dose decreased. Now back on 40 mg Prilosec with some improvement but not resolution of breakthrough symptoms.  Continue Prilosec 40 mg q am before breakfast. Will temporarily add famotidine 20 mg at bedtime.  Discussed anti-reflux measures such as avoidance of late meals / bedtime snacks, HOB elevation (or use of wedge pillow), weight reduction ( if applicable)  / maintaining a healthy BMI ( body mass index),  and avoidance of trigger foods and caffeine.   # ADD, autism, anxiety / depression  # Obesity, PCOS   HPI   Sarah Shah is a 21 yo female known to Sarah Shah with a pmh of PCOS , attention deficit disorder , depression, autism , anxiety, PCOS, obesity.  See PMH /PSH for additional history   Sarah Shah established care in January 2023 for evaluation of RLQ pain.  She was seen by Sarah Shah, see that note for details.  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  She also gave a history of GERD with breakthrough reflux symptoms on daily PPI.   She subsequently underwent EGD and colonoscopy and both were normal.  No further GI work-up was pursued.   Interval  History:  Sarah Shah's mother contacted the office yesterday.  She had been to the emergency department with nausea and abdominal pain.  In the ED her white count was normal, hemoglobin slightly elevated at 15.3.  Lipase normal . LFTs were elevated. AST was 274, ALT 206, alk phos 109, T. bili 1.4.    Pregnancy test negative.  RUQ ultrasound unremarkable.  Specifically there were no gallstones or gallbladder wall thickening.  Common bile duct 3 mm.    Patient has an appt late November was was worked in for earlier appt.    She had COVID about 3-4 weeks ago.   A few weeks ago she developed terrible stabbing epigastric / RUQ pain radiating through to her back with associated nausea and vomiting. Pain started after eating ice cream. She has continued to have this intermittent pain, mainly after eating. She has never had this particular pain before.  Not taking NSAIDS, other than a one time dose of naprosyn. Still having some nausea, also breakthrough heartburn since PCP lowered dose of prilosec from 40 mg to 20 mg daily.   ED increased dose of Prilosec back to 40 mg and heartburn slightly better.    Regarding new liver chemistry abnormalities.  None of her medications are new.  She has been on same medications for at least a year.  No over-the-counter meds.  No supplements other than iron and a multiple vitamin.  She did have COVID about 4 weeks ago.   Labs:  Latest Ref Rng & Units 10/29/2021    6:24 AM 05/29/2021    3:31 PM 03/25/2021    3:20 PM  CBC  WBC 4.0 - 10.5 K/uL 7.1  8.2  9.2   Hemoglobin 12.0 - 15.0 g/dL 15.3  14.4  14.6   Hematocrit 36.0 - 46.0 % 43.8  40.4  42.5   Platelets 150 - 400 K/uL 301  312  321        Latest Ref Rng & Units 10/29/2021    6:24 AM 04/16/2021    7:48 AM 12/29/2020    9:02 PM  Hepatic Function  Total Protein 6.5 - 8.1 g/dL 7.2  7.1  6.3   Albumin 3.5 - 5.0 g/dL 3.5  4.2  3.7   AST 15 - 41 U/L 274  9  9   ALT 0 - 44 U/L 206  10  16   Alk Phosphatase 38 - 126  U/L 109  101  63   Total Bilirubin 0.3 - 1.2 mg/dL 1.4  <0.2  0.2     Past Medical History:  Diagnosis Date   ADHD (attention deficit hyperactivity disorder)    Anxiety    Autism spectrum    Constipated    Constipation    Depression    Development delay    GERD (gastroesophageal reflux disease)    Insulin resistance    Iron deficiency    Learning disability    Obesity    ODD (oppositional defiant disorder)    PCOS (polycystic ovarian syndrome)    PMDD (premenstrual dysphoric disorder)    Pneumonia    3 mos old and 27 mos old   Visual acuity reduced    glasses    Past Surgical History:  Procedure Laterality Date   BIOPSY  02/17/2021   Procedure: BIOPSY;  Surgeon: Sarah Shah;  Location: [facility];  Service: Endoscopy;;   COLONOSCOPY WITH PROPOFOL N/A 02/17/2021   Procedure: COLONOSCOPY WITH PROPOFOL;  Surgeon: Sarah Shah;  Location: [facility];  Service: Endoscopy;  Laterality: N/A;   ESOPHAGOGASTRODUODENOSCOPY (EGD) WITH PROPOFOL N/A 02/17/2021   Procedure: ESOPHAGOGASTRODUODENOSCOPY (EGD) WITH PROPOFOL;  Surgeon: Sarah Shah;  Location: [facility];  Service: Endoscopy;  Laterality: N/A;   TOOTH EXTRACTION N/A 01/03/2018   Procedure: SURGICAL EXTRACTION OF TEETH #1, 16, 17, 32;  Surgeon: Sarah Shah;  Location: [place];  Service: Oral Surgery;  Laterality: N/A;    Current Medications, Allergies, Family History and Social History were reviewed in [employer] record.     Current Outpatient Medications  Medication Sig Dispense Refill   acetaminophen (TYLENOL) 500 MG tablet Take 1,000 mg by mouth every 8 (eight) hours as needed for moderate pain.     Amphetamine Sulfate (EVEKEO) 10 MG TABS Take 2 tabs in AM, and 2 tabs at lunch. May take 1 tab at 4 PM PRN (Patient taking differently: Take 10-20 mg by mouth See admin instructions. 20 mg in the morning, 20 mg in the afternoon. May take an additional 10 mg at 1600 as needed for  concentration) 150 tablet 0   blood glucose meter kit and supplies KIT Dispense based on patient and insurance preference. Use daily as directed. 1 each 0   busPIRone (BUSPAR) 10 MG tablet 20 mg in the morning, 20 mg at lunch, 10-20 mg at 4p as needed, and can take up to 60 mg total daily (Patient taking differently: Take 10-20 mg by mouth See admin instructions.  20 mg in the morning, 20 mg at lunch, 10-20 mg at 1600 as needed for anxiety.) 540 tablet 1   cloNIDine HCl (KAPVAY) 0.1 MG TB12 ER tablet Take 2 tablets (0.2 mg total) by mouth 2 (two) times daily. Take 2 tablets by mouth 2 times daily (Patient taking differently: Take 0.2 mg by mouth 2 (two) times daily.) 360 tablet 1   desogestrel-ethinyl estradiol (VIORELE) 0.15-0.02/0.01 MG (21/5) tablet Take 1 tablet by mouth daily. Please give active pills only. Skip placebo pills. 140 tablet 3   dicyclomine (BENTYL) 20 MG tablet Take 1 tablet (20 mg total) by mouth every 6 (six) hours. (Patient taking differently: Take 20 mg by mouth every 6 (six) hours as needed (stomach pain).) 60 tablet 0   docusate sodium (COLACE) 100 MG capsule Take 100 mg by mouth at bedtime.     ferrous sulfate 325 (65 FE) MG tablet Take 325 mg by mouth at bedtime.     FLUoxetine (PROZAC) 40 MG capsule Take 1 capsule (40 mg total) by mouth daily. TAKE 1 CAPSULE(40 MG) BY MOUTH DAILY (Patient taking differently: Take 40 mg by mouth daily.) 90 capsule 3   omeprazole (PRILOSEC) 20 MG capsule Take 1 capsule (20 mg total) by mouth daily. 30 capsule 3   ondansetron (ZOFRAN ODT) 4 MG disintegrating tablet Take 1 tablet (4 mg total) by mouth every 8 (eight) hours as needed for nausea or vomiting. 20 tablet 0   ondansetron (ZOFRAN) 4 MG tablet Take 1 tablet (4 mg total) by mouth every 6 (six) hours. 12 tablet 0   Topiramate ER (TROKENDI XR) 50 MG CP24 Take 1 capsule by mouth daily. (Patient taking differently: Take 50 mg by mouth daily.) 30 capsule 1   vitamin C (ASCORBIC ACID) 500 MG  tablet Take 500 mg by mouth at bedtime.     Vitamin D, Ergocalciferol, (DRISDOL) 1.25 MG (50000 UNIT) CAPS capsule Take 50,000 Units by mouth once a week.     No current facility-administered medications for this visit.    Review of Systems: No chest pain. No shortness of breath. No urinary complaints.    Physical Exam  BP 100/74   Pulse 98   Ht 5' 5" (1.651 m)   Wt (!) 315 lb 4 oz (143 kg)   BMI 52.46 kg/m  Constitutional:  pleasant obese female in no acute distress. Here with mother.  Psychiatric: Normal mood and affect. Behavior is normal. EENT: Pupils normal.  Conjunctivae are normal. No scleral icterus. Neck supple.  Cardiovascular: Normal rate, regular rhythm. No edema Pulmonary/chest: Effort normal and breath sounds normal. No wheezing, rales or rhonchi. Abdominal: Soft, nondistended, nontender. Bowel sounds active throughout. There are no masses palpable. No hepatomegaly. Neurological: Alert and oriented to person place and time. Skin: Skin is warm and dry. No rashes noted.  Tye Savoy, NP  11/05/2021, 8:28 AM

## 2021-11-05 NOTE — Patient Instructions (Addendum)
_______________________________________________________  If you are age 20 or older, your body mass index should be between 23-30. Your Body mass index is 52.46 kg/m. If this is out of the aforementioned range listed, please consider follow up with your Primary Care Provider.  If you are age 59 or younger, your body mass index should be between 19-25. Your Body mass index is 52.46 kg/m. If this is out of the aformentioned range listed, please consider follow up with your Primary Care Provider.   ________________________________________________________  The [place] [facility] providers would like to encourage you to use [facility] to communicate with providers for non-urgent requests or questions.  Due to long hold times on the telephone, sending your provider a message by [facility] may be a faster and more efficient way to get a response.  Please allow 48 business hours for a response.  Please remember that this is for non-urgent requests.  _______________________________________________________  Your provider has requested that you go to the basement level for lab work before leaving today. Press "B" on the elevator. The lab is located at the first door on the left as you exit the elevator.  Please get famotidine 20 mg ( over the counter) and take one at bedtime.   Also, we have called in a prescription for carafate to take 4 times daily for two weeks.   Will contact you with lab results  It was a pleasure to see you today!  Thank you for trusting me with your gastrointestinal care!

## 2021-11-05 NOTE — Progress Notes (Signed)
Noted  

## 2021-11-10 ENCOUNTER — Encounter: Admitting: Family Medicine

## 2021-11-12 ENCOUNTER — Inpatient Hospital Stay: Admitting: Family

## 2021-11-13 ENCOUNTER — Ambulatory Visit (INDEPENDENT_AMBULATORY_CARE_PROVIDER_SITE_OTHER): Admitting: Clinical

## 2021-11-13 DIAGNOSIS — F4320 Adjustment disorder, unspecified: Secondary | ICD-10-CM | POA: Diagnosis not present

## 2021-11-13 DIAGNOSIS — F840 Autistic disorder: Secondary | ICD-10-CM

## 2021-11-14 NOTE — Progress Notes (Signed)
[facility] Counselor/Therapist Progress Note  Patient ID: Sarah Shah, MRN: [id number]    Date: 11/13/2021  Time Spent: 3:12 pm - 4:01 pm: 49 Minutes  Type of Service Provided Individual Therapy  Type of Contact in-person Location: office  Mental Status Exam: Appearance:  Casual and Fairly Groomed     Behavior: Sharing and slightly agitated at times  Motor: Slightly restless  Speech/Language:  Clear and Coherent  Affect: At times slightly tearful  Mood: irritable  Thought process: normal  Thought content:   WNL  Sensory/Perceptual disturbances:   WNL  Orientation: oriented to person, place, time/date, and situation  Attention: Good  Concentration: Fair  Memory: WNL  Fund of knowledge:  Fair  Insight:   Fair to poor  Judgment:  Fair  Impulse Control: Fair   Risk Assessment: Danger to Self:  No -denied SI Self-injurious Behavior:  None reported Danger to Others: No denied Duty to Warn:no  Presenting Problems, Reported Symptoms, and /or Interim History: [name] presented for a session to address life stress and mood challenges  Subjective: Sarah Shah presented for an individual outpatient therapy session. The following was addressed during sessions.   Vernica reported that she has been having some health challenges that have been hard for her.  She rated her overall mood as neutral, her anxiety currently is neutral, and her anger and frustration on the day of the session is neutral.  However, she expressed several frustrations and upsets about recent interactions with her parent and seemed to have some difficulty understanding what her parents were reportedly trying to communicate.  Ways to reduce frustration in the short-term were discussed as well as strategies that may be helpful to reduce her frustration in the long-term (including learning to drive so she can be more independent and considering finding a place to live where she is more in control).  She also  expressed frustration about some of her school classes though she does like some of them.  She reported that her trip was good but described having several panic episodes including at the airport when her bag was searched, during various exposures to things that were frightening, and panic on the way back related to her bag being searched there as well.  Interventions/Psychotherapy Techniques Used During Session: Cognitive Behavioral Therapy, Assertiveness/Communication, Psychologist, occupational, and Solution-Oriented/Positive Psychology  Diagnosis: Autism spectrum disorder  Adjustment disorder, unspecified type  MENTAL HEALTH INTERVENTIONS USED DURING TREATMENT & PATIENT'S RESPONSE TO INTERVENTIONS:  Short-term Objective addressed today:Sarah Shah will be able to identify any negative thinking patterns that are perpetuating anxiety or depressive symptoms and replace these with more adaptive thoughts.  Mental health techniques used: Objective was addressed in session through the use of  Cognitive Behavioral Therapy and discussion. Sarah Shah's response was generally positive.  Progress Toward Goal: Some progress  Short-term Objective addressed today:Sarah Shah will be able to identify areas that she needs to address to increase the likelihood of her being able to successfully live independently. AND Sarah Shah will be able to create a plan to address weaknesses described in objective 3A.   Mental health techniques used: Objective was addressed in session through the use of Equities trader Psychology and discussion. Danaya's response was positive though some of her ideas for plans of how to achieve these goals seems slightly less realistic.  Progress Toward Goal: Some progress    PLAN  1. [name] will return for a therapy session.   2. Homework Given: Attempt calming and coping strategies at  home, begin to seek more information about her long-term plan.. This homework will be reviewed  with Sarah Shah at the next visit.  3. During the next session start addressing panic associated with trips including worries about her bag being searched at the airport which came up at the end of the session last time, check in on frustration mood and anxiety.     [name], [name]   Individual Treatment Plan  - please see the notes from 04/17/2021 & 05/01/2021 for complete treatment plan information.  Problem/Need: Sarah Shah has a history of anxiety and mood concerns - with some ongoing challenges in these areas  Long-Term Goal #1: Sarah Shah will be able to identify and address any mood or anxiety challenges and will maintain a low level of anxiety and depression symptoms as evidenced by self-report. Short-Term Objectives: Objective 1A: Sarah Shah will be able to rate her mood and anxiety  Objective 1B: Sarah Shah will be able to identify any negative thinking patterns that are perpetuating anxiety or depressive symptoms and replace these with more adaptive thoughts.  Objective 1C: Sarah Shah will be able to use appropriate coping strategies including helpful thoughts to maintain a low level of anxiety and depressive symptoms. Interventions: Cognitive Behavioral Therapy, Systems analyst, Motivational Interviewing, and Psycho-education/Bibliotherapy  coping skills and other evidenced-based practices will be used to promote progress towards healthy functioning and to help manage decrease symptoms associated with their diagnosis.  Treatment Regimen: Individual skill building sessions every 2-4 weeks to address treatment goal/objective Target Date: 04/2022 Responsible Party: therapist and patient Person delivering treatment: Licensed Psychologist [name], [name] will support the patient's ability to achieve the goals identified. Resolved:  No  Problem/Need: Life transition - Sarah Shah is in the process of completing a major life transition including graduating from high school, starting college, and potentially  working. Long-Term Goal #2: Sarah Shah will successfully navigate these life transitions including being able to manage her stress level. Short-Term Objectives: Objective 2A: Sarah Shah will be able to identify stressors associated with the life transitions  Objective 2B: Rosmery will be able to identify strategies to help navigate stressors associated with the life transitions and implement them  Interventions: Assertiveness/Communication, Systems analyst, and Psycho-education/Bibliotherapy , and other evidenced-based practices will be used to promote progress towards healthy functioning and to help manage decrease symptoms associated with their diagnosis.  Treatment Regimen: Individual skill building sessions every 2-4 weeks to address treatment goal/objective Target Date: 04/2022 Responsible Party: therapist and patient Person delivering treatment: Licensed Psychologist [name], [name] will support the patient's ability to achieve the goals identified. Resolved:  No  Problem/Need: Life skills - Briza needs to develop some additional skills to facilitate independent living. Long-Term Goal #3: Sherrin will identify the additional life skills necessary to facilitate independent living. Short-Term Objectives: Objective 3A: Jazara will be able to identify areas that she needs to address to increase the likelihood of her being able to successfully live independently.   Objective 3B: Evanthia will be able to create a plan to address weaknesses described in objective 3A.   Objective 3C: Shasta will be able to implement steps on the plan developed in objective 3B. Interventions: Motivational Interviewing, Solution-Oriented/Positive Psychology, and Psycho-education/Bibliotherapy  daily life skills, and other evidenced-based practices will be used to promote progress towards healthy functioning and to help manage decrease symptoms associated with their diagnosis.  Treatment Regimen: Individual skill building  sessions every 2-4 weeks to address treatment goal/objective Target Date: 04/2022 Responsible Party: therapist and patient Person delivering treatment:  Licensed Psychologist [name], [name] will support the patient's ability to achieve the goals identified. Resolved:  No  [name], [name]

## 2021-11-20 ENCOUNTER — Encounter: Payer: Self-pay | Admitting: Physician Assistant

## 2021-11-21 ENCOUNTER — Other Ambulatory Visit: Payer: Self-pay

## 2021-11-21 DIAGNOSIS — E611 Iron deficiency: Secondary | ICD-10-CM

## 2021-11-26 ENCOUNTER — Other Ambulatory Visit: Payer: Self-pay

## 2021-11-26 ENCOUNTER — Inpatient Hospital Stay: Attending: Hematology and Oncology

## 2021-11-26 ENCOUNTER — Other Ambulatory Visit

## 2021-11-26 DIAGNOSIS — E611 Iron deficiency: Secondary | ICD-10-CM | POA: Insufficient documentation

## 2021-11-26 LAB — IRON AND IRON BINDING CAPACITY (CC-WL,HP ONLY)
Iron: 41 ug/dL (ref 28–170)
Saturation Ratios: 12 % (ref 10.4–31.8)
TIBC: 354 ug/dL (ref 250–450)
UIBC: 313 ug/dL

## 2021-11-26 LAB — CBC WITH DIFFERENTIAL (CANCER CENTER ONLY)
Abs Immature Granulocytes: 0.02 10*3/uL (ref 0.00–0.07)
Basophils Absolute: 0 10*3/uL (ref 0.0–0.1)
Basophils Relative: 0 %
Eosinophils Absolute: 0.1 10*3/uL (ref 0.0–0.5)
Eosinophils Relative: 1 %
HCT: 39.7 % (ref 36.0–46.0)
Hemoglobin: 13.9 g/dL (ref 12.0–15.0)
Immature Granulocytes: 0 %
Lymphocytes Relative: 28 %
Lymphs Abs: 2.3 10*3/uL (ref 0.7–4.0)
MCH: 29.1 pg (ref 26.0–34.0)
MCHC: 35 g/dL (ref 30.0–36.0)
MCV: 83.2 fL (ref 80.0–100.0)
Monocytes Absolute: 0.8 10*3/uL (ref 0.1–1.0)
Monocytes Relative: 10 %
Neutro Abs: 5.1 10*3/uL (ref 1.7–7.7)
Neutrophils Relative %: 61 %
Platelet Count: 301 10*3/uL (ref 150–400)
RBC: 4.77 MIL/uL (ref 3.87–5.11)
RDW: 12.1 % (ref 11.5–15.5)
WBC Count: 8.4 10*3/uL (ref 4.0–10.5)
nRBC: 0 % (ref 0.0–0.2)

## 2021-11-27 ENCOUNTER — Other Ambulatory Visit: Payer: Self-pay | Admitting: Physician Assistant

## 2021-11-27 ENCOUNTER — Ambulatory Visit (INDEPENDENT_AMBULATORY_CARE_PROVIDER_SITE_OTHER): Admitting: Clinical

## 2021-11-27 DIAGNOSIS — F32A Depression, unspecified: Secondary | ICD-10-CM

## 2021-11-27 DIAGNOSIS — F419 Anxiety disorder, unspecified: Secondary | ICD-10-CM | POA: Diagnosis not present

## 2021-11-27 DIAGNOSIS — F840 Autistic disorder: Secondary | ICD-10-CM | POA: Diagnosis not present

## 2021-11-27 LAB — FERRITIN: Ferritin: 246 ng/mL (ref 11–307)

## 2021-11-27 NOTE — Progress Notes (Signed)
[facility] Counselor/Therapist Progress Note  Patient ID: Sarah Shah, MRN: [id number]    Date: 11/27/21  Time Spent: 9:20 am - 10:00 am: 40 Minutes  Type of Service Provided Individual Therapy  Type of Contact virtual (via [employer] with real time audio and visual interaction) Patient Location: home       Provider Location: office  [name] participated from home, via video, and consented to treatment. Therapist participated from office.    Mental Status Exam: Appearance:  Casual     Behavior: Appropriate  Motor: Normal  Speech/Language:  Clear and Coherent  Affect: Appropriate  Mood: normal  Thought process: normal  Thought content:   WNL  Sensory/Perceptual disturbances:   WNL  Orientation: oriented to person, place, time/date, and situation  Attention: Good  Concentration: Good  Memory: WNL  Fund of knowledge:  Fair  Insight:   Fair  Judgment:  Fair  Impulse Control: Fair - good    Risk Assessment: Danger to Self:  No - denied SI Self-injurious Behavior:  None reported Danger to Others: No - denied  Duty to Warn:no  Presenting Problems, Reported Symptoms, and /or Interim History: Aracelie presented for a session to address frustration and stress.  Subjective: Zacari presented for an individual outpatient therapy session. The following was addressed during sessions.   Session began late because susie ehresman and session had to be converted from in person to virtual.  Tristin reported that her mood was a 5 out of 10 (with 1 being sad, 5 being neutral, and 10 being happy).  She rated her anxiety as a 5 out of 10 as well (with 10 being high).  She initially rated her anger or frustration as a 2 out of 10 but apparently her mother overheard this rating and suggested that her anger or frustration was more in line with a 10 out of 10.  Why her mother had this perception was discussed and Tuleen was able to identify several times between sessions  when she had experienced and expressed some anger and frustration.  Strategies to help try to prevent these upsets as well as strategies she could use to manage frustration were discussed. Tyechia reported that her dog passed away between sessions.  She indicated that her mood has been somewhat lower on previous days because of this.  Strategies to help her manage this emotion were discussed.  Panic she experienced at the airport that was brought up during the last session but not fully discussed was discussed as part of the current session.  What triggered the panic as well as strategies she can use to address this in the future was discussed.  Interventions/Psychotherapy Techniques Used During Session: Cognitive Behavioral Therapy, Assertiveness/Communication, Psychologist, occupational, and Solution-Oriented/Positive Psychology  Diagnosis: Anxiety and depression  Autism spectrum disorder  MENTAL HEALTH INTERVENTIONS USED DURING TREATMENT & PATIENT'S RESPONSE TO INTERVENTIONS:  Short-term Objective addressed today:Jeree will be able to identify any negative thinking patterns that are perpetuating anxiety or depressive symptoms and replace these with more adaptive thoughts. AND Lotta will be able to use appropriate coping strategies including helpful thoughts to maintain a low level of anxiety and depressive symptoms. Mental health techniques used: Objective was addressed in session through the use of Cognitive Behavioral Therapy, Assertiveness/Communication, Psychologist, occupational, and Solution-Oriented/Positive Psychology and discussion. Shana's response was generally positive.  Progress Toward Goal: Some progress  PLAN  1. Kaliegh will return for a therapy session.   2. Homework Given: Set an alarm  to do a self check-in at least once a day where she determines her current level of stress and or frustration.  If she is in the yellow zone or showing elevated levels of stress, anxiety, or frustration, Jazyah  will try to use her skills to reduce her level of upset. This homework will be reviewed with [name] at the next visit.  3. During the next session check in on mood, anxiety, life stress, frustration.     [name], [name]  Individual Treatment Plan  - please see the notes from 04/17/2021 & 05/01/2021 for complete treatment plan information.  Problem/Need: Tala has a history of anxiety and mood concerns - with some ongoing challenges in these areas  Long-Term Goal #1: Kiauna will be able to identify and address any mood or anxiety challenges and will maintain a low level of anxiety and depression symptoms as evidenced by self-report. Short-Term Objectives: Objective 1A: Kalisha will be able to rate her mood and anxiety  Objective 1B: Faviola will be able to identify any negative thinking patterns that are perpetuating anxiety or depressive symptoms and replace these with more adaptive thoughts.  Objective 1C: Nylan will be able to use appropriate coping strategies including helpful thoughts to maintain a low level of anxiety and depressive symptoms. Interventions: Cognitive Behavioral Therapy, Psychologist, occupational, Motivational Interviewing, and Psycho-education/Bibliotherapy  coping skills and other evidenced-based practices will be used to promote progress towards healthy functioning and to help manage decrease symptoms associated with their diagnosis.  Treatment Regimen: Individual skill building sessions every 2-4 weeks to address treatment goal/objective Target Date: 04/2022 Responsible Party: therapist and patient Person delivering treatment: Licensed Psychologist [name], [name] will support the patient's ability to achieve the goals identified. Resolved:  No  Problem/Need: Life transition - Willamae is in the process of completing a major life transition including graduating from high school, starting college, and potentially working. Long-Term Goal #2: Mikayah will successfully navigate these  life transitions including being able to manage her stress level. Short-Term Objectives: Objective 2A: Amira will be able to identify stressors associated with the life transitions  Objective 2B: Mckaylee will be able to identify strategies to help navigate stressors associated with the life transitions and implement them  Interventions: Assertiveness/Communication, Psychologist, occupational, and Psycho-education/Bibliotherapy , and other evidenced-based practices will be used to promote progress towards healthy functioning and to help manage decrease symptoms associated with their diagnosis.  Treatment Regimen: Individual skill building sessions every 2-4 weeks to address treatment goal/objective Target Date: 04/2022 Responsible Party: therapist and patient Person delivering treatment: Licensed Psychologist [name], [name] will support the patient's ability to achieve the goals identified. Resolved:  No  Problem/Need: Life skills - Aisley needs to develop some additional skills to facilitate independent living. Long-Term Goal #3: Brennan will identify the additional life skills necessary to facilitate independent living. Short-Term Objectives: Objective 3A: Macklyn will be able to identify areas that she needs to address to increase the likelihood of her being able to successfully live independently.   Objective 3B: Callista will be able to create a plan to address weaknesses described in objective 3A.   Objective 3C: Neelam will be able to implement steps on the plan developed in objective 3B. Interventions: Motivational Interviewing, Solution-Oriented/Positive Psychology, and Psycho-education/Bibliotherapy  daily life skills, and other evidenced-based practices will be used to promote progress towards healthy functioning and to help manage decrease symptoms associated with their diagnosis.  Treatment Regimen: Individual skill building sessions every 2-4 weeks to address  treatment goal/objective Target Date:  04/2022 Responsible Party: therapist and patient Person delivering treatment: Licensed Psychologist [name], [name] will support the patient's ability to achieve the goals identified. Resolved:  No   [name], [name]

## 2021-12-01 ENCOUNTER — Encounter (INDEPENDENT_AMBULATORY_CARE_PROVIDER_SITE_OTHER): Payer: Self-pay | Admitting: Pediatric Endocrinology

## 2021-12-01 ENCOUNTER — Ambulatory Visit (INDEPENDENT_AMBULATORY_CARE_PROVIDER_SITE_OTHER): Admitting: Pediatric Endocrinology

## 2021-12-01 VITALS — BP 122/78 | HR 140 | Ht 64.65 in | Wt 315.8 lb

## 2021-12-01 DIAGNOSIS — E282 Polycystic ovarian syndrome: Secondary | ICD-10-CM | POA: Diagnosis not present

## 2021-12-01 DIAGNOSIS — Z6841 Body Mass Index (BMI) 40.0 and over, adult: Secondary | ICD-10-CM

## 2021-12-01 DIAGNOSIS — E88819 Insulin resistance, unspecified: Secondary | ICD-10-CM | POA: Diagnosis not present

## 2021-12-01 LAB — POCT GLUCOSE (DEVICE FOR HOME USE): POC Glucose: 115 mg/dl — AB (ref 70–99)

## 2021-12-01 LAB — POCT GLYCOSYLATED HEMOGLOBIN (HGB A1C): Hemoglobin A1C: 5.3 % (ref 4.0–5.6)

## 2021-12-01 MED ORDER — TIRZEPATIDE 2.5 MG/0.5ML ~~LOC~~ SOAJ: 2.5000 mg | SUBCUTANEOUS | 3 refills | Status: DC

## 2021-12-01 NOTE — Progress Notes (Signed)
Subjective:  Subjective  Patient Name: Sarah Shah Date of Birth: April 28, 2000  MRN: [id number]  Sarah Shah  presents to the office today for follow up evaluation and management of her insulin resistance, morbid obesity, and PCOS  HISTORY OF PRESENT ILLNESS:   Sarah Shah is a 21 y.o. Caucasian female   Sarah Shah was accompanied by her mother   1. Sarah Shah was seen by her PCP in the fall of 2017 for medication adjustment at age 66. She was noted to have ongoing rapid weight gain. 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 She was referred to endocrinology for further evaluation of rapid weight gain.   2. Sarah Shah was last seen in Pediatric Endocrine clinic on 05/28/20.  In the interim she has been doing generally well.     She is now seeing Dr. Juleen China at [facility] (on [address]). She was meant to be taking Mounjaro 10 mg. However, her insurance stopped covering the Uncertain. They have had her try a variety of oral medications for weight management including phentermine, topamax XL. She did not tolerate either of these medications.   She also tried a combination pill of both Topamax and Phentermine but did not tolerate that either. (Mom can't remember the name)- qysmeia?  She is going to [facility] for her behavioral health. She is seeing Rollene Fare there.  She had labs on Thursday and she is in need of another iron infusion.   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 She makes mom take care of the [place].  She has 2 ducks, 4 [place] pigs, 1 rabbit, 2 dogs. She is interested in getting a service dog.   She has started to take treatment tablets only for her OCP (generic Minerva Fester).  She has not been having any menses on this daily regimen.   She is drinking only water.   3. Pertinent Review of Systems:  Constitutional: The patient feels "meah". The patient seems healthy.   Eyes: Vision seems to be  good. There are no recognized eye problems. Wears glasses.   Neck: The patient has no complaints of anterior neck swelling, soreness, tenderness, pressure, discomfort, or difficulty swallowing.   Heart: Heart rate increases with exercise or other physical activity. The patient has no complaints of palpitations, irregular heart beats, chest pain, or chest pressure.   Lungs: no asthma or wheezing Gastrointestinal: Bowel movents seem normal. The patient has no complaints of excessive hunger, acid reflux, upset stomach, stomach aches or pains, diarrhea, or constipation.   Legs: Muscle mass and strength seem normal. There are no complaints of numbness, tingling, burning, or pain. No edema is noted.  Feet: Flat feet- complaining of pain with walking. Neurologic: There are no recognized problems with muscle movement and strength, sensation, or coordination. GYN/GU: Per HPI   PAST MEDICAL, FAMILY, AND SOCIAL HISTORY  Past Medical History:  Diagnosis Date   ADHD (attention deficit hyperactivity disorder)    Anxiety    Autism spectrum    Constipated    Constipation    Depression    Development delay    GERD (gastroesophageal reflux disease)    Insulin resistance    Iron deficiency    Learning disability    Obesity    ODD (oppositional defiant disorder)    PCOS (polycystic ovarian syndrome)    PMDD (premenstrual dysphoric disorder)    Pneumonia    3 mos old and 9  mos old   Visual acuity reduced    glasses    Family History  Problem Relation Age of Onset   Diabetes Mother    Hypertension Mother    Anxiety disorder Mother    Other Mother        Premenstrual dysphoic disorder   Obesity Mother    Stroke Mother    Colon polyps Mother    Irritable bowel syndrome Mother    Diabetes Father        type 1   Depression Father    Anxiety disorder Father    Obesity Father    Anxiety disorder Sister    ADD / ADHD Sister        ADD   Other Sister        Premenstrual dysphoric disorder    Atrial fibrillation Maternal Grandmother    Diabetes Maternal Grandmother    Heart failure Maternal Grandmother    Hypertension Maternal Grandfather    Iron deficiency Maternal Aunt    Breast cancer Maternal Aunt      Current Outpatient Medications:    acetaminophen (TYLENOL) 500 MG tablet, Take 1,000 mg by mouth every 8 (eight) hours as needed for moderate pain., Disp: , Rfl:    Amphetamine Sulfate (EVEKEO) 10 MG TABS, Take 2 tabs in AM, and 2 tabs at lunch. May take 1 tab at 4 PM PRN (Patient taking differently: Take 10-20 mg by mouth See admin instructions. 20 mg in the morning, 20 mg in the afternoon. May take an additional 10 mg at 1600 as needed for concentration), Disp: 150 tablet, Rfl: 0   busPIRone (BUSPAR) 10 MG tablet, 20 mg in the morning, 20 mg at lunch, 10-20 mg at 4p as needed, and can take up to 60 mg total daily (Patient taking differently: Take 10-20 mg by mouth See admin instructions. 20 mg in the morning, 20 mg at lunch, 10-20 mg at 1600 as needed for anxiety.), Disp: 540 tablet, Rfl: 1   cloNIDine HCl (KAPVAY) 0.1 MG TB12 ER tablet, Take 2 tablets (0.2 mg total) by mouth 2 (two) times daily. Take 2 tablets by mouth 2 times daily (Patient taking differently: Take 0.2 mg by mouth 2 (two) times daily.), Disp: 360 tablet, Rfl: 1   desogestrel-ethinyl estradiol (VIORELE) 0.15-0.02/0.01 MG (21/5) tablet, Take 1 tablet by mouth daily. Please give active pills only. Skip placebo pills., Disp: 140 tablet, Rfl: 3   dicyclomine (BENTYL) 20 MG tablet, Take 1 tablet (20 mg total) by mouth every 6 (six) hours. (Patient taking differently: Take 20 mg by mouth every 6 (six) hours as needed (stomach pain).), Disp: 60 tablet, Rfl: 0   diphenhydrAMINE (SOMINEX) 25 MG tablet, Take 25 mg by mouth at bedtime as needed for sleep., Disp: , Rfl:    docusate sodium (COLACE) 100 MG capsule, Take 100 mg by mouth at bedtime., Disp: , Rfl:    ferrous sulfate 325 (65 FE) MG tablet, Take 325 mg by mouth at  bedtime., Disp: , Rfl:    FLUoxetine (PROZAC) 40 MG capsule, Take 1 capsule (40 mg total) by mouth daily. TAKE 1 CAPSULE(40 MG) BY MOUTH DAILY (Patient taking differently: Take 40 mg by mouth daily.), Disp: 90 capsule, Rfl: 3   omeprazole (PRILOSEC) 40 MG capsule, Take 40 mg by mouth daily., Disp: , Rfl:    ondansetron (ZOFRAN ODT) 4 MG disintegrating tablet, Take 1 tablet (4 mg total) by mouth every 8 (eight) hours as needed for nausea or vomiting., Disp: 20  tablet, Rfl: 0   ondansetron (ZOFRAN) 4 MG tablet, Take 1 tablet (4 mg total) by mouth every 6 (six) hours., Disp: 12 tablet, Rfl: 0   tirzepatide (MOUNJARO) 2.5 MG/0.5ML Pen, Inject 2.5 mg into the skin once a week., Disp: 2 mL, Rfl: 3   Topiramate ER (TROKENDI XR) 50 MG CP24, Take 1 capsule by mouth daily. (Patient taking differently: Take 50 mg by mouth daily.), Disp: 30 capsule, Rfl: 1   vitamin C (ASCORBIC ACID) 500 MG tablet, Take 500 mg by mouth at bedtime., Disp: , Rfl:    Vitamin D, Ergocalciferol, (DRISDOL) 1.25 MG (50000 UNIT) CAPS capsule, Take 50,000 Units by mouth once a week., Disp: , Rfl:    blood glucose meter kit and supplies KIT, Dispense based on patient and insurance preference. Use daily as directed. (Patient not taking: Reported on 12/01/2021), Disp: 1 each, Rfl: 0   sucralfate (CARAFATE) 1 g tablet, Take 1 tablet (1 g total) by mouth 4 (four) times daily -  with meals and at bedtime. (Patient not taking: Reported on 12/01/2021), Disp: 56 tablet, Rfl: 0  Allergies as of 12/01/2021   (No Known Allergies)     reports that she has never smoked. She has never used smokeless tobacco. She reports that she does not drink alcohol. Pediatric History  Patient Parents   Cleon Gustin (Mother)   Other Topics Concern   Not on file  Social History Narrative   Will start 12th grade at [employer] for the 22/23 school year.       07/20/19   Enjoys: sleep, paints, sewing (sock dolls), animal care   From: born in [place]  but moved her when she was little   Who is at home: with mom Hilda Blades, stepdad Gerald Stabs, and sister [place]   Pets: loves her [place] pigs, Architectural technologist, ducks, dogs, classroom bunny   School: [employer]    Grade: 11th grade this fall      Family: good relationship with mom and family             Exercise: walking with mom    Diet: avoiding reflux worsening food      Safety   Seat belts: Yes    Guns: Yes  and secure   Safe in relationships: Yes    Helmets: Yes    Smoke Exposure at home: No   Bullying: Yes  and knows who she can ask for help and feels    1. School and Family:  Taking classes at [employer]. 2. Activities:  3. Primary Care Provider: Eugenia Pancoast, FNP  ROS: There are no other significant problems involving Kennedi's other body systems.    Objective:  Objective  Vital Signs:       BP 122/78   Pulse (!) 140   Ht 5' 4.65" (1.642 m)   Wt (!) 315 lb 12.8 oz (143.2 kg)   LMP 07/12/2021 (Exact Date)   BMI 53.13 kg/m   Growth %ile SmartLinks can only be used for patients less than 46 years old.  Ht Readings from Last 3 Encounters:  12/01/21 5' 4.65" (1.642 m)  11/05/21 _0  (1.651 m)  05/28/21 5' 3.82" (1.621 m) (43 %, Z= -0.19)*   * Growth percentiles are based on CDC (Girls, 2-20 Years) data.   Wt Readings from Last 3 Encounters:  12/01/21 (!) 315 lb 12.8 oz (143.2 kg)  11/05/21 (!) 315 lb 4 oz (143 kg)  05/28/21 286 lb (129.7 kg) (>99 %, Z= 2.76)*   *  Growth percentiles are based on CDC (Girls, 2-20 Years) data.   HC Readings from Last 3 Encounters:  No data found for [facility]   Body surface area is 2.56 meters squared. Facility age limit for growth %iles is 20 years. Facility age limit for growth %iles is 20 years.   PHYSICAL EXAM:    Constitutional: The patient appears comfortable.  The patient's height and weight are obese for age. Weight is +29 pounds Head: The head is normocephalic. Face: The face appears normal. There are no obvious dysmorphic  features. Eyes: The eyes appear to be normally formed and spaced. Gaze is conjugate. There is no obvious arcus or proptosis. Moisture appears normal. Ears: The ears are normally placed and appear externally normal. Mouth: The oropharynx and tongue appear normal. Dentition appears to be normal for age. Oral moisture is normal. Neck: The neck appears to be visibly normal.. The consistency of the thyroid gland is normal. The thyroid gland is not tender to palpation. Trace acanthosis- improving Lungs: No increased work of breathing. No cough Heart: Heart rate regular. Pulses and peripheral perfusion regular Abdomen: The abdomen appears to be obese in size for the patient's age. There is no obvious hepatomegaly, splenomegaly, or other mass effect.  Arms: Muscle size and bulk are normal for age. Hands: There is no obvious tremor. Phalangeal and metacarpophalangeal joints are normal. Palmar muscles are normal for age. Palmar skin is normal. Palmar moisture is also normal. Legs: Muscles appear normal for age. No edema is present. Feet: Feet are normally formed. Dorsalis pedal pulses are normal. Neurologic: Strength is normal for age in both the upper and lower extremities. Muscle tone is normal. Sensation to touch is normal in both the legs and feet.   Skin: stretch marks on abdomen, back, legs, arms. Mostly reddish to flesh colored. Mild hair growth on sideburns and chin.    LAB DATA:    Lab Results  Component Value Date   HGBA1C 5.3 12/01/2021   HGBA1C 4.9 04/16/2021   HGBA1C 4.7 11/28/2020   HGBA1C 5.1 07/25/2020   HGBA1C 5.5 03/25/2020   HGBA1C 5.2 03/12/2020   HGBA1C 5.4 12/12/2019   HGBA1C 5.2 08/07/2019    Results for orders placed or performed in visit on 12/01/21  POCT glycosylated hemoglobin (Hb A1C)  Result Value Ref Range   Hemoglobin A1C 5.3 4.0 - 5.6 %   HbA1c POC (<> result, manual entry)     HbA1c, POC (prediabetic range)     HbA1c, POC (controlled diabetic range)    POCT  Glucose (Device for Home Use)  Result Value Ref Range   Glucose Fasting, POC     POC Glucose 115 (A) 70 - 99 mg/dl       Assessment and Plan:  Assessment  ASSESSMENT: Keyundra is a 21 y.o. Caucasian female with autism, developmental delay, anger/behavior concerns and evidence of insulin resistance/PCOS associated with obesity.    Insulin resistance/hyperphagia - A1C continues in normal rage - [facility]  - Acanthosis has been stable - Mounjaro denied by insurance- now with regain of weight despite trials of several different oral weight loss medications recommended by her insurance and prescribed by her PCP - Discussed new FDA approval of ZepBound for weight loss (same medication as Mounjaro but different indication). Unclear when this will be commercially available but will require a PA.  - Script to pharmacy for ZepBound 2.5 mg weekly.   Obesity - Class 3 obesity - BMI >  58 - Has failed a variety of oral weight loss medications - Previously had good weight loss on Mounjaro - Will write for Zep Bound (as above)  PCOS - On Kariva (generic) OCP.  - Now doing continuous cycling without breakthrough bleeding - She is much happier and less snarky/depressed/irritable - androgen levels have improved - Despite no menses she has continued to require iron infusions at hematology  PLAN:    1. Diagnostic: Lab Orders         POCT glycosylated hemoglobin (Hb A1C)         POCT Glucose (Device for Home Use)     2. Therapeutic: Reviewed lifestyle goals.  Meds ordered this encounter  Medications   tirzepatide (MOUNJARO) 2.5 MG/0.5ML Pen    Sig: Inject 2.5 mg into the skin once a week.    Dispense:  2 mL    Refill:  3    This prescription is for ZepBound which is not yet available in Epic for prescribing. Please fill when available in retail pharmacy.    3. Patient education: Discussion of the above.  4. Follow-up: Return in about 3 months  (around 03/03/2022).      Lelon Huh, MD  >40 minutes spent today reviewing the medical chart, counseling the patient/family, and documenting today's encounter.

## 2021-12-02 ENCOUNTER — Ambulatory Visit: Admitting: Physician Assistant

## 2021-12-08 ENCOUNTER — Inpatient Hospital Stay

## 2021-12-08 VITALS — BP 118/80 | HR 88 | Temp 98.2°F | Resp 16

## 2021-12-08 DIAGNOSIS — E611 Iron deficiency: Secondary | ICD-10-CM | POA: Diagnosis not present

## 2021-12-08 MED ORDER — SODIUM CHLORIDE 0.9 % IV SOLN
510.0000 mg | Freq: Once | INTRAVENOUS | Status: AC
  Administered 2021-12-08: 510 mg via INTRAVENOUS
  Filled 2021-12-08: qty 17

## 2021-12-08 MED ORDER — SODIUM CHLORIDE 0.9 % IV SOLN: Freq: Once | INTRAVENOUS | Status: AC

## 2021-12-08 NOTE — Progress Notes (Signed)
Pt observed for 30 minutes post Feraheme infusion. Pt tolerated trtmt well w/out incident. VSS at discharge.  Ambulatory to lobby.   

## 2021-12-08 NOTE — Patient Instructions (Signed)

## 2021-12-15 ENCOUNTER — Encounter: Payer: Self-pay | Admitting: Physician Assistant

## 2021-12-16 ENCOUNTER — Telehealth: Payer: Self-pay | Admitting: Hematology and Oncology

## 2021-12-16 NOTE — Telephone Encounter (Signed)
Per 12/4 IB, message left  

## 2021-12-17 ENCOUNTER — Ambulatory Visit: Admitting: Hematology and Oncology

## 2021-12-17 ENCOUNTER — Other Ambulatory Visit

## 2021-12-18 ENCOUNTER — Ambulatory Visit (INDEPENDENT_AMBULATORY_CARE_PROVIDER_SITE_OTHER): Admitting: Clinical

## 2021-12-18 DIAGNOSIS — F32A Depression, unspecified: Secondary | ICD-10-CM | POA: Diagnosis not present

## 2021-12-18 DIAGNOSIS — F419 Anxiety disorder, unspecified: Secondary | ICD-10-CM | POA: Diagnosis not present

## 2021-12-18 DIAGNOSIS — F840 Autistic disorder: Secondary | ICD-10-CM | POA: Diagnosis not present

## 2021-12-19 NOTE — Progress Notes (Signed)
[facility] Counselor/Therapist Progress Note  Patient ID: Sarah Shah, MRN: [id number]    Date: 12/18/2021  Time Spent: 2:05 pm - 3:00 pm: 55 Minutes  Type of Service Provided Individual Therapy  Type of Contact virtual (via Caregility with real time audio and visual interaction)  Patient Location: car       Provider Location: office  Sarah Shah participated from car, via video, and consented to treatment. Therapist participated from office.  Of note, Sarah Shah's mother joined for this visit as she was in the car with Sarah Shah (for the brief period that the car was in motion Sarah Shah's mother was driving but they shortly arrived home and continued the visit from their driveway).  Sarah Shah provided verbal permission and affirmation that she wanted her mother to participate in the visit   Mental Status Exam: Appearance:  Casual     Behavior: Sharing  Motor: Normal  Speech/Language:  Clear and Coherent  Affect: Appropriate to slightly irritable at times  Mood: normal and to slight irritation  Thought process: normal  Thought content:   WNL  Sensory/Perceptual disturbances:   WNL  Orientation: oriented to person, place, situation, and day of week  Attention: Good  Concentration: Good  Memory: WNL  Fund of knowledge:  Fair  Insight:   Fair  Judgment:  Fair  Impulse Control: Good   Risk Assessment: No apparent indicators of HI or SI during the visit   Presenting Problems, Reported Symptoms, and /or Interim History: Sarah Shah presented for a visit to discuss life skills, life transitions, and mood and anxiety  Subjective: Sarah Shah and her mother presented for an individual outpatient therapy session. The following was addressed during sessions.   Sarah Shah rated her mood as a 5 out of 10 (with 1 being sad, 5 being neutral, and 10 being happy).  She indicated that her anxiety was "in the middle" on a 10-point scale.  Sarah Shah was asked to continue to try to rate her mood, anxiety,  and irritability frustration on a daily basis but scale was simplified to low/medium/high rather than a 1-10 scale for home use.  Sarah Shah discussed the desire to change her participation in her school programming for next year but indicated she was anxious about her mother's reaction.  Her mother, who was present for the visit, indicated that she was supportive of Sarah Shah's desire to change but reported a few additional assurances that she hoped that Sarah Shah would agree to.  These were discussed and agreed upon, including ensuring that Sarah Shah has consistent social interactions with friends and that Sarah Shah knows how to and will seek out additional supports at school if needed.  Her mother expressed some concerns that Sarah Shah sometimes lacked "empathy" given her reaction to some of the students in her current school program.  During this discussion it became apparent that it would be helpful for Sarah Shah to develop a healthy way to release frustration.  Specifically, behavior of her other students could be understandable to Sarah Shah based on their challenges but continued to cause her distress or frustration regardless of this understanding.  This will continue to be addressed.  Interventions/Psychotherapy Techniques Used During Session: Cognitive Behavioral Therapy and Solution-Oriented/Positive Psychology  Diagnosis: Autism spectrum disorder  Anxiety and depression  MENTAL HEALTH INTERVENTIONS USED DURING TREATMENT & PATIENT'S RESPONSE TO INTERVENTIONS:  Short-term Objective addressed today: Sarah Shah will be able to identify strategies to help navigate stressors associated with the life transitions and implement them  Mental health techniques used: Objective was addressed  in session through the use of Cognitive Behavioral Therapy and Solution-Oriented/Positive Psychology, and discussion. Sarah Shah's response was positive.  Progress Toward Goal: Progressing  Short-term Objective addressed today:Sarah Shah will be able to create a  plan to address weaknesses described in objective 3A. Mental health techniques used: Objective was addressed in session through the use of Cognitive Behavioral Therapy and Solution-Oriented/Positive Psychology, and discussion. Sarah Shah's response was positive.  Progress Toward Goal: Progressing    PLAN  1. Sarah Shah will return for a therapy session.   2. Homework Given: Sarah Shah will make the transition out of her current school program into her next steps and will discuss creating regular social outings with her friend group before leaving the program, she will continue using stress management techniques, rate mood, anxiety, frustration on a low medium and high scale once a day. This homework will be reviewed with Sarah Shah at the next visit.  3. During the next session check in on mood anxiety and transitions.     Sarah Chess, [name]  Individual Treatment Plan  - please see the notes from 04/17/2021 & 05/01/2021 for complete treatment plan information.  Problem/Need: Sarah Shah has a history of anxiety and mood concerns - with some ongoing challenges in these areas  Long-Term Goal #1: Sarah Shah will be able to identify and address any mood or anxiety challenges and will maintain a low level of anxiety and depression symptoms as evidenced by self-report. Short-Term Objectives: Objective 1A: Sarah Shah will be able to rate her mood and anxiety  Objective 1B: Sarah Shah will be able to identify any negative thinking patterns that are perpetuating anxiety or depressive symptoms and replace these with more adaptive thoughts.  Objective 1C: Sarah Shah will be able to use appropriate coping strategies including helpful thoughts to maintain a low level of anxiety and depressive symptoms. Interventions: Cognitive Behavioral Therapy, Systems analyst, Motivational Interviewing, and Psycho-education/Bibliotherapy  coping skills and other evidenced-based practices will be used to promote progress towards healthy functioning and to help  manage decrease symptoms associated with their diagnosis.  Treatment Regimen: Individual skill building sessions every 2-4 weeks to address treatment goal/objective Target Date: 04/2022 Responsible Party: therapist and patient Person delivering treatment: Licensed Psychologist Sarah Chess, [name] will support the patient's ability to achieve the goals identified. Resolved:  No  Problem/Need: Life transition - Sarah Shah is in the process of completing a major life transition including graduating from high school, starting college, and potentially working. Long-Term Goal #2: Sarah Shah will successfully navigate these life transitions including being able to manage her stress level. Short-Term Objectives: Objective 2A: Sarah Shah will be able to identify stressors associated with the life transitions  Objective 2B: Sarah Shah will be able to identify strategies to help navigate stressors associated with the life transitions and implement them  Interventions: Assertiveness/Communication, Systems analyst, and Psycho-education/Bibliotherapy , and other evidenced-based practices will be used to promote progress towards healthy functioning and to help manage decrease symptoms associated with their diagnosis.  Treatment Regimen: Individual skill building sessions every 2-4 weeks to address treatment goal/objective Target Date: 04/2022 Responsible Party: therapist and patient Person delivering treatment: Licensed Psychologist Sarah Chess, [name] will support the patient's ability to achieve the goals identified. Resolved:  No  Problem/Need: Life skills - Sarah Shah needs to develop some additional skills to facilitate independent living. Long-Term Goal #3: Sarah Shah will identify the additional life skills necessary to facilitate independent living. Short-Term Objectives: Objective 3A: Aleka will be able to identify areas that she needs to address to increase the likelihood of her  being able to successfully live independently.    Objective 3B: Carlinda will be able to create a plan to address weaknesses described in objective 3A.   Objective 3C: Randilee will be able to implement steps on the plan developed in objective 3B. Interventions: Motivational Interviewing, Solution-Oriented/Positive Psychology, and Psycho-education/Bibliotherapy  daily life skills, and other evidenced-based practices will be used to promote progress towards healthy functioning and to help manage decrease symptoms associated with their diagnosis.  Treatment Regimen: Individual skill building sessions every 2-4 weeks to address treatment goal/objective Target Date: 04/2022 Responsible Party: therapist and patient Person delivering treatment: Licensed Psychologist Sarah Chess, [name] will support the patient's ability to achieve the goals identified. Resolved:  No   Sarah Chess, [name]

## 2021-12-22 ENCOUNTER — Encounter: Payer: Self-pay | Admitting: Physician Assistant

## 2021-12-24 ENCOUNTER — Encounter: Payer: Self-pay | Admitting: Nurse Practitioner

## 2021-12-24 ENCOUNTER — Ambulatory Visit (INDEPENDENT_AMBULATORY_CARE_PROVIDER_SITE_OTHER): Admitting: Nurse Practitioner

## 2021-12-24 ENCOUNTER — Other Ambulatory Visit: Payer: Self-pay | Admitting: Nurse Practitioner

## 2021-12-24 ENCOUNTER — Encounter: Payer: Self-pay | Admitting: Physician Assistant

## 2021-12-24 VITALS — BP 100/70 | HR 100 | Ht 64.0 in | Wt 319.0 lb

## 2021-12-24 DIAGNOSIS — R1013 Epigastric pain: Secondary | ICD-10-CM

## 2021-12-24 DIAGNOSIS — R12 Heartburn: Secondary | ICD-10-CM

## 2021-12-24 DIAGNOSIS — G8929 Other chronic pain: Secondary | ICD-10-CM

## 2021-12-24 MED ORDER — SUCRALFATE 1 G PO TABS: ORAL_TABLET | ORAL | 0 refills | Status: DC

## 2021-12-24 NOTE — Progress Notes (Signed)
Assessment    Patient profile:  Sarah Shah is a 21 y.o. female known to Dr. Henrene Shah with a past medical history of PCOS , obesity, attention deficit disorder , depression, autism , anxiety. See PMH /PSH for additional history  # 21 yo female with persistent pyrosis  / epigastric burning on daily Omeprazole 40 mg daily and pepcid at bedtime. Normal EGD Feb 2023 for upper abdominal pain. Normal RUQ Oct 2023  # Isolated elevation of liver chemistries in Oct 2023. Etiology unclear but possibly related to COVID infection. There was almost complete resolution on repeat labs. Most recent ALT was 68, alk phos 126, remainder of liver chemistries normal. [place] was negative.   # Obesity. Insurance stopped paying for [facility] so unfortunately she regained about 40 or the 50 pounds back since May  Plan   ** Will be exported to patient instructions on AVS  Discussed anti-reflux measures such as avoidance of late meals / bedtime snacks, HOB elevation (or use of wedge pillow), weight reduction ,  and avoidance of trigger foods and caffeine.  Continue current GERD medications including Omeprazole 40 mg before breakfast and pepcid at bedtime.  Will refill carafate 1 gram to take before meals three times daily for 3 weeks.   If pyrosis persists despite above measures consider 24 hr pH study to try and correlate symptoms with acid reflux   Follow up liver labs at next visit to ensure normalization   HPI    Chief complaint: follow up on abnormal liver tests and also GERD   Sarah Shah was seen in Jan 2023 for abdominal pain. Subsequently she underwent EGD and colonoscopy without any findings to explain pain.   11/05/21 visit Sarah Shah is here with her Mother. She was seen here 11/05/21 for epigastric / RUQ pain and nausea. please refer to that office note for details. In summary, clinical picture seemed biliary but [place] was normal. There was some thought that elevated liver chemistries could be related to  recent COVID infection but that didn't explain the abdominal pain pain. We continued prilosec 40 mg q am and added pepcid at bedtime. She was tried on carafate as well.   Anti-reflux measures were reviewed. Repeat liver tests ordered.   Repeat liver chemistries on 10/25 showed significant improvement. AST 274 >> 16 and ALT 206 >> 68. Alk phos ( previously normal) was slightly elevated at 126.    Interval History:  Sarah Shah is taking Omeprazole before breakfast and pepcid at bedtime. Still getting frequent heartburn even when avoids culprit foods such as red sauce and vinegar.  She doesn't consume caffeine or drink sodas. She does to bed on an empty stomach and sleeps propped up. Sometimes the burning radiates downward into epigastrium and through to her back. When this occurs she takes Bentyl which helps.  Carafate also helps but out of Rx.  Mother is here and tells me that Sarah Shah has had reflux since she was a baby.   [name] had lost weight on Sarah Shah. Insurance wouldn't cover it because she didn't have DM. Since stopping Sarah Shah in May she has regained about 40 pounds.   Labs:     Latest Ref Rng & Units 11/26/2021    4:12 PM 10/29/2021    6:24 AM 05/29/2021    3:31 PM  CBC  WBC 4.0 - 10.5 K/uL 8.4  7.1  8.2   Hemoglobin 12.0 - 15.0 g/dL 13.9  15.3  14.4   Hematocrit 36.0 - 46.0 % 39.7  43.8  40.4   Platelets 150 - 400 K/uL 301  301  312        Latest Ref Rng & Units 11/05/2021   10:51 AM 10/29/2021    6:24 AM 04/16/2021    7:48 AM  Hepatic Function  Total Protein 6.0 - 8.3 g/dL 7.4  7.2  7.1   Albumin 3.5 - 5.2 g/dL 3.9  3.5  4.2   AST 0 - 37 U/L 16  274  9   ALT 0 - 35 U/L 68  206  10   Alk Phosphatase 39 - 117 U/L 126  109  101   Total Bilirubin 0.2 - 1.2 mg/dL 0.4  1.4  <0.2   Bilirubin, Direct 0.0 - 0.3 mg/dL 0.1        Past Medical History:  Diagnosis Date   ADHD (attention deficit hyperactivity disorder)    Anxiety    Autism spectrum    Constipated    Constipation     Depression    Development delay    GERD (gastroesophageal reflux disease)    Insulin resistance    Iron deficiency    Learning disability    Obesity    ODD (oppositional defiant disorder)    PCOS (polycystic ovarian syndrome)    PMDD (premenstrual dysphoric disorder)    Pneumonia    3 mos old and 35 mos old   Visual acuity reduced    glasses    Past Surgical History:  Procedure Laterality Date   BIOPSY  02/17/2021   Procedure: BIOPSY;  Surgeon: Sarah Shipper, MD;  Location: [facility];  Service: Endoscopy;;   COLONOSCOPY WITH PROPOFOL N/A 02/17/2021   Procedure: COLONOSCOPY WITH PROPOFOL;  Surgeon: Sarah Shipper, MD;  Location: [facility];  Service: Endoscopy;  Laterality: N/A;   ESOPHAGOGASTRODUODENOSCOPY (EGD) WITH PROPOFOL N/A 02/17/2021   Procedure: ESOPHAGOGASTRODUODENOSCOPY (EGD) WITH PROPOFOL;  Surgeon: Sarah Shipper, MD;  Location: [facility];  Service: Endoscopy;  Laterality: N/A;   TOOTH EXTRACTION N/A 01/03/2018   Procedure: SURGICAL EXTRACTION OF TEETH #1, 16, 17, 32;  Surgeon: Sarah Shah, DMD;  Location: [facility];  Service: Oral Surgery;  Laterality: N/A;    Current Medications, Allergies, Family History and Social History were reviewed in [employer] record.     Current Outpatient Medications  Medication Sig Dispense Refill   acetaminophen (TYLENOL) 500 MG tablet Take 1,000 mg by mouth every 8 (eight) hours as needed for moderate pain.     Amphetamine Sulfate (EVEKEO) 10 MG TABS Take 2 tabs in AM, and 2 tabs at lunch. May take 1 tab at 4 PM PRN (Patient taking differently: Take 10-20 mg by mouth See admin instructions. 20 mg in the morning, 20 mg in the afternoon. May take an additional 10 mg at 1600 as needed for concentration) 150 tablet 0   blood glucose meter kit and supplies KIT Dispense based on patient and insurance preference. Use daily as directed. (Patient not taking: Reported on 12/01/2021) 1 each 0   busPIRone (BUSPAR) 10 MG tablet 20  mg in the morning, 20 mg at lunch, 10-20 mg at 4p as needed, and can take up to 60 mg total daily (Patient taking differently: Take 10-20 mg by mouth See admin instructions. 20 mg in the morning, 20 mg at lunch, 10-20 mg at 1600 as needed for anxiety.) 540 tablet 1   cloNIDine HCl (KAPVAY) 0.1 MG TB12 ER tablet Take 2 tablets (0.2 mg total) by mouth 2 (two)  times daily. Take 2 tablets by mouth 2 times daily (Patient taking differently: Take 0.2 mg by mouth 2 (two) times daily.) 360 tablet 1   desogestrel-ethinyl estradiol (VIORELE) 0.15-0.02/0.01 MG (21/5) tablet Take 1 tablet by mouth daily. Please give active pills only. Skip placebo pills. 140 tablet 3   dicyclomine (BENTYL) 20 MG tablet Take 1 tablet (20 mg total) by mouth every 6 (six) hours. (Patient taking differently: Take 20 mg by mouth every 6 (six) hours as needed (stomach pain).) 60 tablet 0   diphenhydrAMINE (SOMINEX) 25 MG tablet Take 25 mg by mouth at bedtime as needed for sleep.     docusate sodium (COLACE) 100 MG capsule Take 100 mg by mouth at bedtime.     ferrous sulfate 325 (65 FE) MG tablet Take 325 mg by mouth at bedtime.     FLUoxetine (PROZAC) 40 MG capsule Take 1 capsule (40 mg total) by mouth daily. TAKE 1 CAPSULE(40 MG) BY MOUTH DAILY (Patient taking differently: Take 40 mg by mouth daily.) 90 capsule 3   omeprazole (PRILOSEC) 40 MG capsule Take 40 mg by mouth daily.     ondansetron (ZOFRAN ODT) 4 MG disintegrating tablet Take 1 tablet (4 mg total) by mouth every 8 (eight) hours as needed for nausea or vomiting. 20 tablet 0   ondansetron (ZOFRAN) 4 MG tablet Take 1 tablet (4 mg total) by mouth every 6 (six) hours. 12 tablet 0   sucralfate (CARAFATE) 1 g tablet Take 1 tablet (1 g total) by mouth 4 (four) times daily -  with meals and at bedtime. (Patient not taking: Reported on 12/01/2021) 56 tablet 0   tirzepatide (Sarah Shah) 2.5 MG/0.5ML Pen Inject 2.5 mg into the skin once a week. 2 mL 3   Topiramate ER (TROKENDI XR) 50 MG  CP24 Take 1 capsule by mouth daily. (Patient taking differently: Take 50 mg by mouth daily.) 30 capsule 1   vitamin C (ASCORBIC ACID) 500 MG tablet Take 500 mg by mouth at bedtime.     Vitamin D, Ergocalciferol, (DRISDOL) 1.25 MG (50000 UNIT) CAPS capsule Take 50,000 Units by mouth once a week.     No current facility-administered medications for this visit.    Review of Systems: No chest pain. No shortness of breath. No urinary complaints.    Physical Exam  Wt Readings from Last 3 Encounters:  12/01/21 (!) 315 lb 12.8 oz (143.2 kg)  11/05/21 (!) 315 lb 4 oz (143 kg)  05/28/21 286 lb (129.7 kg) (>99 %, Z= 2.76)*   * Growth percentiles are based on CDC (Girls, 2-20 Years) data.    LMP  (LMP Unknown)  Constitutional:  pleasant obese female in no acute distress. Psychiatric: Normal mood and affect. Behavior is normal. EENT: Pupils normal.  Conjunctivae are normal. No scleral icterus. Neck supple.  Cardiovascular: Normal rate, regular rhythm. No edema Pulmonary/chest: Effort normal and breath sounds normal. No wheezing, rales or rhonchi. Abdominal: Soft, nondistended, nontender. Bowel sounds active throughout. There are no masses palpable. No hepatomegaly. Neurological: Alert and oriented to person place and time. Skin: Skin is warm and dry. No rashes noted.  I spent 30 minutes total reviewing records, obtaining history, performing exam, counseling patient and documenting visit / findings.   Tye Savoy, NP  12/24/2021, 9:42 AM

## 2021-12-24 NOTE — Progress Notes (Signed)
Noted  

## 2021-12-24 NOTE — Patient Instructions (Addendum)
You have been scheduled for an appointment with Lucrezia Europe, NP on 02/23/22 at 11 am. Please arrive 10 minutes early for your appointment.   We have sent the following medications to your pharmacy for you to pick up at your convenience: Carafate 1 G tablet three times a day before meals for 21 days.  Continue current GERD medications including Omeprazole 40 mg before breakfast and pepcid at bedtime.  Will refill carafate 1 gram to take before meals three times daily.   Follow up liver labs at next visit  Acid Reflux  Below are some measures you can take to possibly improve acid reflux symptoms . We may have discussed some of these today in the office. Not everything on this list may apply to you   --Avoid late meals / bedtime snacks.   --Avoid trigger foods ( foods which you know tend to aggravate you reflux symptoms). Some common trigger foods include spicy foods, fatty foods, acidic foods, chocolate and caffeine.  --Elevate the head of bed 6-8 inches on blocks or bricks. If not able to elevate the head of the bed consider purchasing a wedge pillow to sleep on.    --Weight reduction / maintain a healthy BMI ( body mass index) may be help with reflux symptoms  --Sometimes with the above mentioned "lifestyle changes" patients are able to reduce the amount of GERD medications they take. Our goal is to have you on the lowest effective dose of medication

## 2021-12-26 ENCOUNTER — Telehealth (INDEPENDENT_AMBULATORY_CARE_PROVIDER_SITE_OTHER): Payer: Self-pay

## 2021-12-26 NOTE — Telephone Encounter (Signed)
Received fax to initate PA for [facility] from [place].  Entered information in on covermymeds, however it came up an error message for patients tricare info    Provider last visit note stated:

## 2021-12-26 NOTE — Telephone Encounter (Signed)
See error that was pasted in the chart and address as follows:  Please call patient and/or family to confirm the zip coide and/or phone number to make sure it matches our system and [employer] Once updated, resubmit PA  Thank you,  Dr. Judie Petit

## 2022-01-07 ENCOUNTER — Encounter: Admitting: Family

## 2022-01-09 ENCOUNTER — Encounter: Payer: Self-pay | Admitting: Physician Assistant

## 2022-01-09 ENCOUNTER — Encounter: Payer: Self-pay | Admitting: Adult Health

## 2022-01-09 ENCOUNTER — Encounter (INDEPENDENT_AMBULATORY_CARE_PROVIDER_SITE_OTHER): Payer: Self-pay | Admitting: Pediatric Endocrinology

## 2022-01-09 ENCOUNTER — Ambulatory Visit (INDEPENDENT_AMBULATORY_CARE_PROVIDER_SITE_OTHER): Admitting: Adult Health

## 2022-01-09 ENCOUNTER — Ambulatory Visit (HOSPITAL_COMMUNITY): Admission: EM | Admit: 2022-01-09 | Discharge: 2022-01-09 | Disposition: A | Discharge status: Home / Self Care

## 2022-01-09 ENCOUNTER — Telehealth: Payer: Self-pay | Admitting: Clinical

## 2022-01-09 DIAGNOSIS — F840 Autistic disorder: Secondary | ICD-10-CM

## 2022-01-09 DIAGNOSIS — F411 Generalized anxiety disorder: Secondary | ICD-10-CM | POA: Diagnosis not present

## 2022-01-09 DIAGNOSIS — F332 Major depressive disorder, recurrent severe without psychotic features: Secondary | ICD-10-CM | POA: Diagnosis not present

## 2022-01-09 DIAGNOSIS — F4320 Adjustment disorder, unspecified: Secondary | ICD-10-CM

## 2022-01-09 DIAGNOSIS — F3341 Major depressive disorder, recurrent, in partial remission: Secondary | ICD-10-CM

## 2022-01-09 DIAGNOSIS — F913 Oppositional defiant disorder: Secondary | ICD-10-CM

## 2022-01-09 DIAGNOSIS — F902 Attention-deficit hyperactivity disorder, combined type: Secondary | ICD-10-CM | POA: Diagnosis not present

## 2022-01-09 DIAGNOSIS — F4323 Adjustment disorder with mixed anxiety and depressed mood: Secondary | ICD-10-CM

## 2022-01-09 MED ORDER — AMPHETAMINE SULFATE 10 MG PO TABS: ORAL_TABLET | ORAL | 0 refills | Status: DC

## 2022-01-09 NOTE — Telephone Encounter (Addendum)
Returned call from [place] and her mother. [name] reported a large increase in depressive symptoms.  She reported that she has been having suicidal thoughts every day and last night thought about taking a bottle of pills.  Although she did not act on this thought she did engage in cutting.  She reported that she did not need medical attention for the cutting.  She reported that she was not really trying to kill herself when engaged in the cutting but it has been a while since she has engaged in this type of behavior.  Her mother reported that [name] has never expressed something like this before, and reported that when discussing the thoughts [name] told her that she did not 'want to leave her like that'. [name] was seen by her prescriber earlier in the day but it does not sound like the full scope of the thoughts that she had been having was shared.  [name] reported that she was not suicidal at the time of the call and denied intent, but also expressed worries about what she would feel in the near future (e.g., what she may feel or think about doing in the evening).  Safety was discussed, including locking up all potentially dangerous objects and 24-hour monitoring. [name]'s parents will be home this weekend and will be able to monitor her.  If she is alone she can call her sister as well. [name] and the therapist generated a list of activities and people that [name] could use to manage her negative feelings and keep herself safe.  [name] committed to talking to someone if thoughts escalate.  [name] and her mother reported that they felt that they could keep [name] safe in the short-term, but [name] reported some fears about what she could do in the future if things were to escalate.  Given the escalation in symptoms, frequency of suicidal thoughts, self-harming behavior, and her general level of upset therapist recommended evaluation at the emergency department or behavioral health urgent care. [name] and her mother agreed to be  seen at behavioral health urgent care today.  The possibility that [name] requires a higher level of care was also discussed.  Next outpatient visit is scheduled for January 3.  [name], [name]

## 2022-01-09 NOTE — ED Provider Notes (Signed)
Behavioral Health Urgent Care Medical Screening Exam  Patient Name: Sarah Shah MRN: [id number] Date of Evaluation: 01/09/22 Chief Complaint:   Diagnosis:  Final diagnoses:  Autism spectrum disorder  GAD (generalized anxiety disorder)  Adjustment disorder with mixed anxiety and depressed mood    History of Present illness: Sarah Shah is a 21 y.o. female with history of autism spectrum disorder, anxiety, and depression.  Patient presented voluntarily for a walk-in assessment; patient is accompanied by her mother, Kirke Corin who participated in assessment with permission from patient.  This nurse petitioner reviewed patient's chart and met with her face-to-face.  On assessment, patient is observed sitting quietly with her mother in assessment room.  Patient is in no apparent distress.  She is alert and oriented x 4.  Patient's mood is anxious with a congruent affect.  Her thought process is coherent.  No signs of mania, paranoia, preoccupation, or delusional thought content observed during assessment.  Patient reports she was feeling overwhelmed after an argument with her Sarah last night. She reports she had suicidal thoughts about overdosing on pills last night. She reports she engaged in self harming by cutting on her left forearm yesterday. She is noted with superficial marks to left forearm. She reports increased anxiety and depressive symptoms over the past 2 weeks. She denies current suicidal ideation or desire to harm herself in any way. She says she is able to contract for safety.   Patient's mother reports patient enjoys school and day program as she is able to interact with friends and care for various animals in school. She says patient is currently not in school due to winter break. She identified school/day program closing for winter break, patient not having a daily routine, patient not being able to accompany her father to [place] to visit family members, anniversary  of patient's grandmother's death, and fight with Sarah as recent stressors. Patient confirms the above events/incidents are triggers for suicidal thoughts.   Patient denies current/active suicidal ideation.  She denies homicidal ideation, paranoia, hallucination, and substance use.  Safety plan: instructed mother to lock up all potentially dangerous objects (knieves, sharps, firearm, medication, chemicals, etc). Monitor and supervised Nuha frequently. Discussed using positive coping skills learned in therapy, contacting suicidal hotline 988, talking to parents/sisters about negative/suicidal thought, returning to [facility] behavioral urgent care, calling [phone] or going to nearest ED if symptoms worsens. Mother says she is comfortable with patient discharging home and able to keep patient safe. She says patient has an upcoming appointment with therapist on 01/14/22. Mother says she will contact therapist to discuss increasing frequency of therapy sessions.      [facility] from 10/29/2021 in [facility] (Discharged) from 02/17/2021 in [facility] from 12/29/2020 in [facility]  C-SSRS RISK CATEGORY No Risk No Risk No Risk       Psychiatric Specialty Exam  Presentation  General Appearance:Appropriate for Environment  Eye Contact:Good  Speech:Clear and Coherent  Speech Volume:Normal  Handedness:Right   Mood and Affect  Mood: Depressed; Anxious  Affect: Congruent   Thought Process  Thought Processes: Coherent  Descriptions of Associations:Intact  Orientation:Full (Time, Place and Person)  Thought Content:Logical    Hallucinations:None  Ideas of Reference:None  Suicidal Thoughts:No  Homicidal Thoughts:No   Sensorium  Memory: Immediate Good; Recent Fair; Remote Fair  Judgment: Fair  Insight: Fair   Community education officer   Concentration: Fair  Attention Span: Good  Recall: Good  Fund of Knowledge: Good  Language: Good   Psychomotor Activity  Psychomotor Activity: Normal   Assets  Assets: Communication Skills; Desire for Improvement; Housing; Physical Health; Social Support; Transportation   Sleep  Sleep: Fair  Number of hours:  9   Nutritional Assessment (For OBS and FBC admissions only) Has the patient had a weight loss or gain of 10 pounds or more in the last 3 months?: No Has the patient had a decrease in food intake/or appetite?: No Does the patient have dental problems?: No Does the patient have eating habits or behaviors that may be indicators of an eating disorder including binging or inducing vomiting?: No Has the patient recently lost weight without trying?: 0 Has the patient been eating poorly because of a decreased appetite?: 0 Malnutrition Screening Tool Score: 0    Physical Exam: Physical Exam Vitals and nursing note reviewed.  Constitutional:      General: She is not in acute distress.    Appearance: She is well-developed.  HENT:     Head: Normocephalic and atraumatic.  Eyes:     Conjunctiva/sclera: Conjunctivae normal.  Cardiovascular:     Rate and Rhythm: Normal rate.     Heart sounds: No murmur heard. Pulmonary:     Effort: Pulmonary effort is normal. No respiratory distress.  Abdominal:     Tenderness: There is no abdominal tenderness.  Musculoskeletal:        General: No swelling.     Cervical back: Normal range of motion.  Skin:    General: Skin is warm and dry.     Capillary Refill: Capillary refill takes less than 2 seconds.  Neurological:     Mental Status: She is alert and oriented to person, place, and time.  Psychiatric:        Attention and Perception: Attention and perception normal.        Mood and Affect: Mood is anxious and depressed.        Speech: Speech normal.        Behavior: Behavior normal. Behavior is cooperative.         Thought Content: Thought content normal. Thought content is not paranoid or delusional. Thought content does not include homicidal or suicidal ideation. Thought content does not include homicidal or suicidal plan.        Cognition and Memory: Cognition normal.    Review of Systems  Constitutional: Negative.   HENT: Negative.    Eyes: Negative.   Respiratory: Negative.    Cardiovascular: Negative.   Gastrointestinal: Negative.   Genitourinary: Negative.   Musculoskeletal: Negative.   Skin: Negative.   Neurological: Negative.   Endo/Heme/Allergies: Negative.   Psychiatric/Behavioral:  Positive for depression. Negative for hallucinations, substance abuse and suicidal ideas. The patient is nervous/anxious.    Blood pressure 104/80, pulse 90, temperature 98.7 F (37.1 C), temperature source Oral, resp. rate 20, last menstrual period 06/12/2021, SpO2 100 %. There is no height or weight on file to calculate BMI.  Musculoskeletal: Strength & Muscle Tone: within normal limits Gait & Station: normal Patient leans: Right   [facility] MSE Discharge Disposition for Follow up and Recommendations: Based on my evaluation the patient does not appear to have an emergency medical condition and can be discharged with resources and follow up care in outpatient services for Medication Management and Individual Therapy   Ene A Ajibola, NP 01/09/2022, 11:24 PM  

## 2022-01-09 NOTE — ED Notes (Signed)
Pt was given AVS and her belongings. She was escorted to the lobby with her mom

## 2022-01-09 NOTE — BH Assessment (Signed)
Sarah Shah is a 21 year old female presenting to [facility] accompanied by her mother with chief complaint of suicidal thoughts for two weeks. Pt denies current SI however reports last night she was having thought about taking a bottle of pills to kill herself. Pt reports cutting herself with a crafting knife instead of overdosing. Patient denies prior suicide attempts. Patient reports she has been feeling depressed since Christmas due to her dad telling her that he wasn't going to [place] and he went anyway without her. Patient reports she was triggered last night by having a verbal fight with sister. Today was patient third visit for med mgt and on the way to her appointment she showed mom that she cut herself last night. When the psychiatrist was informed of patient thoughts and cutting mom felt like nothing was done so she called her primary therapist who scheduled an emergency virtual appointment today and afterwards the therapist suggested that pt come here for an evaluation. Pt next therapy appointment is on 01/14/21  Mom reports that she has not seen pt this bad and today she told her "I was going to take a bottle of pills but I didn't want to leave you that way". Pt is not feeling suicidal right now but reports if she leaves she will probably cut again. No psych inpt treatment. Outpt therapy at [facility] with Dr. Owens Loffler and she goes to [facility] for medication management.   Pt diagnosis are Autism, developmental delay and depression.

## 2022-01-09 NOTE — Progress Notes (Addendum)
Sarah Shah [id number] 2000-08-24 21 y.o.  Subjective:   Patient ID:  Sarah Shah is a 21 y.o. (DOB [birth year]) female.  Chief Complaint: No chief complaint on file.   HPI Sarah Shah presents to the office today for follow-up of MDD, Autism, Adjustment disorder, ODD, GAD, OCD.  Accompanied by mother - not present interview.  Diagnosed with ODD, ADHD, learning disabilities, Autistic - currently testing on a 2nd grade level.  Medical - PCOS, insulin resistance chronic fatigue, iron deficiency.  Describes mood today as "ok". Pleasant. Flat. Mood symptoms - reports depression. Reports some anxiety. Reports increased irritability at times. Reports outbursts - "breaking things". Was hoping to go on a trip over Christmas and didn't get to go. Reports worry - "completing household things". Mood fluctuates - "when I get overwhelmed or overstimulated". Stating "I feel like I'm doing ok - life is frustrating in general". Feels like medications are helpful. Stable interest and motivation. Taking medications as prescribed.  Energy levels stable. Active, has a regular exercise routine. Walking dog. Enjoys some usual interests and activities. Spending time with family. Has one sister - graduated from [employer]. Has an Aussie doodle "Ruby". Loves animals - has 4 [place] pigs, ducks and a cow. 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 Playing video games. Appetite adequate. Weight gain. Recently approved for [place]. Sleeps well most nights. Averages 7 to 8 hours. Focus and concentration stable with medication. Completing tasks. Managing aspects of household. 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 Denies SI or HI.  Denies AH or VH. Reports recent self harm - cuts on left forearm - angry - triggered by a lack of self confidence. Denies substance use.  Seeing therapist - Dr. Raenette Rover Psychologist - [facility].  Previous medication trials:  Vyvanse, Ritalin, Daytrana patch.   [place]  Office Visit from 03/03/2021 in [place] at [facility] Visit from 07/20/2019 in [place] at [facility]  Total GAD-7 Score 7 1      PHQ2-9    [place] Visit from 03/03/2021 in [place] at [facility] Video Visit from 10/28/2020 in [place] from [facility]/03/2020 [facility] Office Visit from 07/20/2019 in [place] at [facility] Visit from 03/01/2019 in [place]  PHQ-2 Total Score 4 0 0 2 6  PHQ-9 Total Score 10 -- -- 8 [place] ED from 10/29/2021 in [place] Admission (Discharged) from 02/17/2021 in [place] [facility] from 12/29/2020 in [place] [facility]  C-SSRS RISK CATEGORY No Risk No Risk No Risk        Review of Systems:  Review of Systems  Musculoskeletal:  Negative for gait problem.  Neurological:  Negative for tremors.  Psychiatric/Behavioral:         Please refer to HPI    Medications: I have reviewed the patient's current medications.  Current Outpatient Medications  Medication Sig Dispense Refill   acetaminophen (TYLENOL) 500 MG tablet Take 1,000 mg by mouth every 8 (eight) hours as needed for moderate pain.     Amphetamine Sulfate (EVEKEO) 10 MG TABS Take 2 tabs in AM, and 2 tabs at lunch. May take 1 tab at 4 PM PRN 150 tablet 0   blood glucose meter kit and supplies KIT Dispense based on patient and insurance preference. Use daily as directed. 1 each 0   busPIRone (  BUSPAR) 10 MG tablet 20 mg in the morning, 20 mg at lunch, 10-20 mg at 4p as needed, and can take up to 60 mg total daily (Patient taking differently: Take 10-20 mg by mouth See admin instructions. 20 mg in the morning, 20 mg at lunch, 10-20 mg at 1600 as needed for anxiety.) 540 tablet 1   cloNIDine HCl (KAPVAY) 0.1 MG TB12 ER tablet Take 2 tablets (0.2 mg total) by  mouth 2 (two) times daily. Take 2 tablets by mouth 2 times daily (Patient taking differently: Take 0.2 mg by mouth 2 (two) times daily.) 360 tablet 1   desogestrel-ethinyl estradiol (VIORELE) 0.15-0.02/0.01 MG (21/5) tablet Take 1 tablet by mouth daily. Please give active pills only. Skip placebo pills. 140 tablet 3   dicyclomine (BENTYL) 20 MG tablet Take 1 tablet (20 mg total) by mouth every 6 (six) hours. (Patient taking differently: Take 20 mg by mouth every 6 (six) hours as needed (stomach pain).) 60 tablet 0   diphenhydrAMINE (SOMINEX) 25 MG tablet Take 25 mg by mouth at bedtime as needed for sleep.     docusate sodium (COLACE) 100 MG capsule Take 100 mg by mouth at bedtime.     ferrous sulfate 325 (65 FE) MG tablet Take 325 mg by mouth at bedtime.     FLUoxetine (PROZAC) 40 MG capsule Take 1 capsule (40 mg total) by mouth daily. TAKE 1 CAPSULE(40 MG) BY MOUTH DAILY (Patient taking differently: Take 40 mg by mouth daily.) 90 capsule 3   omeprazole (PRILOSEC) 40 MG capsule Take 40 mg by mouth daily.     ondansetron (ZOFRAN ODT) 4 MG disintegrating tablet Take 1 tablet (4 mg total) by mouth every 8 (eight) hours as needed for nausea or vomiting. 20 tablet 0   ondansetron (ZOFRAN) 4 MG tablet Take 1 tablet (4 mg total) by mouth every 6 (six) hours. 12 tablet 0   sucralfate (CARAFATE) 1 g tablet Take 1 G tablet three times a day before meals for 21 days. 63 tablet 0   tirzepatide (MOUNJARO) 2.5 MG/0.5ML Pen Inject 2.5 mg into the skin once a week. 2 mL 3   Topiramate ER (TROKENDI XR) 50 MG CP24 Take 1 capsule by mouth daily. (Patient taking differently: Take 50 mg by mouth daily.) 30 capsule 1   vitamin C (ASCORBIC ACID) 500 MG tablet Take 500 mg by mouth at bedtime.     Vitamin D, Ergocalciferol, (DRISDOL) 1.25 MG (50000 UNIT) CAPS capsule Take 50,000 Units by mouth once a week.     No current facility-administered medications for this visit.    Medication Side Effects: None  Allergies: No  Known Allergies  Past Medical History:  Diagnosis Date   ADHD (attention deficit hyperactivity disorder)    Anxiety    Autism spectrum    Constipated    Constipation    Depression    Development delay    GERD (gastroesophageal reflux disease)    Insulin resistance    Iron deficiency    Learning disability    Obesity    ODD (oppositional defiant disorder)    PCOS (polycystic ovarian syndrome)    PMDD (premenstrual dysphoric disorder)    Pneumonia    3 mos old and 21 mos old   Visual acuity reduced    glasses    Past Medical History, Surgical history, Social history, and Family history were reviewed and updated as appropriate.   Please see review of systems for further details on the patient's review  from today.   Objective:   Physical Exam:  LMP 06/12/2021 Comment: PCOS  Physical Exam Constitutional:      General: She is not in acute distress. Musculoskeletal:        General: No deformity.  Neurological:     Mental Status: She is alert and oriented to person, place, and time.     Coordination: Coordination normal.  Psychiatric:        Attention and Perception: Attention and perception normal. She does not perceive auditory or visual hallucinations.        Mood and Affect: Mood normal. Mood is not anxious or depressed. Affect is not labile, blunt, angry or inappropriate.        Speech: Speech normal.        Behavior: Behavior normal.        Thought Content: Thought content normal. Thought content is not paranoid or delusional. Thought content does not include homicidal or suicidal ideation. Thought content does not include homicidal or suicidal plan.        Cognition and Memory: Cognition and memory normal.        Judgment: Judgment normal.     Comments: Insight intact     Lab Review:     Component Value Date/Time   NA 140 10/29/2021 0624   NA 140 04/16/2021 0748   K 3.6 10/29/2021 0624   CL 107 10/29/2021 0624   CO2 20 (L) 10/29/2021 0624   GLUCOSE 110 (H)  10/29/2021 0624   BUN 7 10/29/2021 0624   BUN 8 04/16/2021 0748   CREATININE 0.77 10/29/2021 0624   CREATININE 0.71 07/26/2020 1022   CREATININE 0.66 09/12/2018 0000   CALCIUM 9.4 10/29/2021 0624   PROT 7.4 11/05/2021 1051   PROT 7.1 04/16/2021 0748   ALBUMIN 3.9 11/05/2021 1051   ALBUMIN 4.2 04/16/2021 0748   AST 16 11/05/2021 1051   AST 10 (L) 07/26/2020 1022   ALT 68 (H) 11/05/2021 1051   ALT 11 07/26/2020 1022   ALKPHOS 126 (H) 11/05/2021 1051   BILITOT 0.4 11/05/2021 1051   BILITOT <0.2 04/16/2021 0748   BILITOT <0.2 (L) 07/26/2020 1022   GFRNONAA >60 10/29/2021 0624   GFRNONAA >60 07/26/2020 1022   GFRAA CANCELED 07/18/2019 0939       Component Value Date/Time   WBC 8.4 11/26/2021 1612   WBC 7.1 10/29/2021 0624   RBC 4.77 11/26/2021 1612   HGB 13.9 11/26/2021 1612   HGB 14.0 12/16/2020 1703   HCT 39.7 11/26/2021 1612   HCT 39.8 12/16/2020 1703   PLT 301 11/26/2021 1612   PLT 372 12/16/2020 1703   MCV 83.2 11/26/2021 1612   MCV 85 12/16/2020 1703   MCH 29.1 11/26/2021 1612   MCHC 35.0 11/26/2021 1612   RDW 12.1 11/26/2021 1612   RDW 13.8 12/16/2020 1703   LYMPHSABS 2.3 11/26/2021 1612   LYMPHSABS 0.9 12/16/2020 1703   MONOABS 0.8 11/26/2021 1612   EOSABS 0.1 11/26/2021 1612   EOSABS 0.0 12/16/2020 1703   BASOSABS 0.0 11/26/2021 1612   BASOSABS 0.0 12/16/2020 1703    No results found for: "POCLITH", "LITHIUM"   No results found for: "PHENYTOIN", "PHENOBARB", "VALPROATE", "CBMZ"   .res Assessment: Plan:     Plan:  PDMP reviewed  Prozac 30m daily Kapvay 0.177m2 BID Buspar 1045m 2 - 3 times a day - has not been taking consistently - plans to take more consistently. Evekeo - Take 2 tabs in AM, and 2 tabs at lunch.  May take 1 tab at 4 PM PRN   RTC 4 weeks  Patient advised to contact office with any questions, adverse effects, or acute worsening in signs and symptoms.   Discussed potential benefits, risks, and side effects of stimulants with  patient to include increased heart rate, palpitations, insomnia, increased anxiety, increased irritability, or decreased appetite.  Instructed patient to contact office if experiencing any significant tolerability issues.   Diagnoses and all orders for this visit:  Adjustment disorder, unspecified type  Attention deficit hyperactivity disorder (ADHD), combined type -     Amphetamine Sulfate (EVEKEO) 10 MG TABS; Take 2 tabs in AM, and 2 tabs at lunch. May take 1 tab at 4 PM PRN  Oppositional defiant disorder  Recurrent major depressive disorder, in partial remission ([place])  Autism spectrum disorder     Please see After Visit Summary for patient specific instructions.  Future Appointments  Date Time Provider [place]  01/14/2022  9:00 AM Harland Dingwall, [name] [facility] None  01/15/2022  2:15 PM [facility] None  01/15/2022  2:40 PM Orson Slick, MD [facility] None  01/27/2022 10:40 AM Eugenia Pancoast, FNP [facility]  02/23/2022 11:00 AM Willia Craze, NP [facility]  03/05/2022  9:15 AM Lelon Huh, MD [facility]    No orders of the defined types were placed in this encounter.   -------------------------------

## 2022-01-09 NOTE — Discharge Instructions (Addendum)
Safety plan: instructed mother to lock up all potentially dangerous objects (knieves, sharps, firearm, medication, chemicals, etc). Monitor and supervised Haniyyah frequently. Discussed using positive coping skills learned in therapy, contacting suicidal hotline 988, talking to parents/sisters about negative/suicidal thought, returning to [facility] behavioral urgent care, calling [phone] or going to nearest ED if symptoms worsens.   No evidence of imminent danger to self or others at this time. Patient does not meet criteria for psychiatric admission or IVC. Supportive therapy provided about ongoing stressors. Discussed crisis plan, callling [phone] or going to Emergency Dept  Discharge recommendations:  Patient is to take medications as prescribed. Please see information for follow-up appointment with psychiatry and therapy. Please follow up with your primary care provider for all medical related needs.   Therapy: We recommend that patient participate in individual therapy to address mental health concerns.  Medications: The patient or guardian is to contact a medical professional and/or outpatient provider to address any new side effects that develop. The patient or guardian should update outpatient providers of any new medications and/or medication changes.    Safety:  The patient should abstain from use of illicit substances/drugs and abuse of any medications. If symptoms worsen or do not continue to improve or if the patient becomes actively suicidal or homicidal then it is recommended that the patient return to the closest hospital emergency department, the [facility], or call 911 for further evaluation and treatment. National Suicide Prevention Lifeline 1-800-SUICIDE or (860)777-2433.  About 988 988 offers 24/7 access to trained crisis counselors who can help people experiencing mental health-related distress. People can call or text 988 or chat  988lifeline.org for themselves or if they are worried about a loved one who may need crisis support.  Crisis Mobile: [facility]:                     [phone] (for crisis response 24 hours a day) [facility] Hotline:                                            [phone]

## 2022-01-10 NOTE — Telephone Encounter (Signed)
LM on voicemail to check in and remind of safety plan.   [name], [name]

## 2022-01-13 ENCOUNTER — Encounter: Payer: Self-pay | Admitting: Physician Assistant

## 2022-01-14 ENCOUNTER — Other Ambulatory Visit: Payer: Self-pay | Admitting: Hematology and Oncology

## 2022-01-14 ENCOUNTER — Encounter: Payer: Self-pay | Admitting: Physician Assistant

## 2022-01-14 ENCOUNTER — Ambulatory Visit: Admitting: Clinical

## 2022-01-14 ENCOUNTER — Ambulatory Visit (INDEPENDENT_AMBULATORY_CARE_PROVIDER_SITE_OTHER): Admitting: Clinical

## 2022-01-14 DIAGNOSIS — E611 Iron deficiency: Secondary | ICD-10-CM

## 2022-01-14 DIAGNOSIS — F32A Depression, unspecified: Secondary | ICD-10-CM

## 2022-01-14 DIAGNOSIS — F840 Autistic disorder: Secondary | ICD-10-CM | POA: Diagnosis not present

## 2022-01-14 NOTE — Progress Notes (Signed)
[facility] Counselor/Therapist Progress Note  Patient ID: Diva Lemberger, MRN: [id number]    Date: 01/14/22  Time Spent: 11:00 am - 11:59 am: 14 Minutes  Type of Service Provided Individual Therapy  Type of Contact in-person Location: office  Mental Status Exam: Appearance:  Casual and appropriately groomed      Behavior: Pranathi had 2 stuffed animals with her that she held and rub throughout the session  Motor: Normal  Speech/Language:  Clear and Coherent and Normal Rate  Affect: Congruent  Mood: sad  Thought process: normal  Thought content:   WNL  Sensory/Perceptual disturbances:   WNL  Orientation: oriented to person, place, time/date, and situation  Attention: Fair  Concentration: Fair  Memory: [place] of knowledge:  Fair  Insight:   Fair  Judgment:  Fair  Impulse Control: Good   Risk Assessment:  Danger to Self:  Yes.  without intent/plan - Kalenna reported that since speaking to the therapist last week she has experienced occasional suicidal ideation.  Intent and plan were denied.  She reported that she has successfully been able to distract herself when experiencing those thoughts.  Safety was reviewed and Raiden reported that she felt she could keep herself safe and knew the steps to take if she were feeling an escalation and ideation or concerns. Self-injurious Behavior:  Denied  - Destry reported that she has not engaged in any SIB since the incident last week. Danger to Others: No Duty to Warn:no  Presenting Problems, Reported Symptoms, and /or Interim History: Tanique presented for a visit to address mood concerns  Subjective: Cayce presented for an individual outpatient therapy session. The following was addressed during sessions.   Lenix reported that her visit to behavioral health urgent care was helpful.  She reported that her current mood was tired and sad.  She indicated she was experiencing a medium amount of anxiety as well as a medium amount  of anger and frustration.  SIB since last week was denied. Kristene reported that she has experienced occasional SI but has been able to distract herself successfully.  She indicated that she has been using a coloring mood journal every day which has been helpful and has been working to distract herself from negative thoughts.  Household stress was discussed, including triggers her to feel worse as well as strategies that she could use to try to address some of these triggers.  The role that disappointments during the holidays and lack of clear structure during her day may have played and contributing to her current mood were discussed.  Behavioral activation strategies were also discussed.   Interventions/Psychotherapy Techniques Used During Session: Cognitive Behavioral Therapy  Diagnosis: Depression, unspecified depression type  Autism spectrum disorder  MENTAL HEALTH INTERVENTIONS USED DURING TREATMENT & PATIENT'S RESPONSE TO INTERVENTIONS:  Short-term Objective addressed today: Harjot will be able to use appropriate coping strategies including helpful thoughts to maintain a low level of anxiety and depressive symptoms. Mental health techniques used: Objective was addressed in session through the use of  Cognitive Behavioral Therapy and discussion. Leniyah's response was positive.  Progress Toward Goal: Significant increase in symptoms from the last visit    PLAN  1. Baley will return for a therapy session.   2. Homework Given: Once she figures out her class times, begin to put a general schedule together for other week, ensure that she is engaging in activities on a daily basis that help her to positive, continue monitoring her mood and following  safety plan as needed. This homework will be reviewed with [name] at the next visit.  3. During the next session check in on mood and new school year.     [name], [name]  Individual Treatment Plan  - please see the notes from 04/17/2021 & 05/01/2021 for  complete treatment plan information.  Problem/Need: Chanti has a history of anxiety and mood concerns - with some ongoing challenges in these areas  Long-Term Goal #1: Emerlyn will be able to identify and address any mood or anxiety challenges and will maintain a low level of anxiety and depression symptoms as evidenced by self-report. Short-Term Objectives: Objective 1A: Lyfe will be able to rate her mood and anxiety  Objective 1B: Johnny will be able to identify any negative thinking patterns that are perpetuating anxiety or depressive symptoms and replace these with more adaptive thoughts.  Objective 1C: Marin will be able to use appropriate coping strategies including helpful thoughts to maintain a low level of anxiety and depressive symptoms. Interventions: Cognitive Behavioral Therapy, Systems analyst, Motivational Interviewing, and Psycho-education/Bibliotherapy  coping skills and other evidenced-based practices will be used to promote progress towards healthy functioning and to help manage decrease symptoms associated with their diagnosis.  Treatment Regimen: Individual skill building sessions every 2-4 weeks to address treatment goal/objective Target Date: 04/2022 Responsible Party: therapist and patient Person delivering treatment: Licensed Psychologist [name], [name] will support the patient's ability to achieve the goals identified. Resolved:  No  Problem/Need: Life transition - Ronnell is in the process of completing a major life transition including graduating from high school, starting college, and potentially working. Long-Term Goal #2: Carroll will successfully navigate these life transitions including being able to manage her stress level. Short-Term Objectives: Objective 2A: Kasarah will be able to identify stressors associated with the life transitions  Objective 2B: Evvie will be able to identify strategies to help navigate stressors associated with the life transitions and  implement them  Interventions: Assertiveness/Communication, Systems analyst, and Psycho-education/Bibliotherapy , and other evidenced-based practices will be used to promote progress towards healthy functioning and to help manage decrease symptoms associated with their diagnosis.  Treatment Regimen: Individual skill building sessions every 2-4 weeks to address treatment goal/objective Target Date: 04/2022 Responsible Party: therapist and patient Person delivering treatment: Licensed Psychologist [name], [name] will support the patient's ability to achieve the goals identified. Resolved:  No  Problem/Need: Life skills - Graelyn needs to develop some additional skills to facilitate independent living. Long-Term Goal #3: Bobbye will identify the additional life skills necessary to facilitate independent living. Short-Term Objectives: Objective 3A: [name] will be able to identify areas that she needs to address to increase the likelihood of her being able to successfully live independently.   Objective 3B: Jala will be able to create a plan to address weaknesses described in objective 3A.   Objective 3C: Missi will be able to implement steps on the plan developed in objective 3B. Interventions: Motivational Interviewing, Solution-Oriented/Positive Psychology, and Psycho-education/Bibliotherapy  daily life skills, and other evidenced-based practices will be used to promote progress towards healthy functioning and to help manage decrease symptoms associated with their diagnosis.  Treatment Regimen: Individual skill building sessions every 2-4 weeks to address treatment goal/objective Target Date: 04/2022 Responsible Party: therapist and patient Person delivering treatment: Licensed Psychologist [name], [name] will support the patient's ability to achieve the goals identified. Resolved:  No    [name], [name]

## 2022-01-15 ENCOUNTER — Other Ambulatory Visit: Payer: Self-pay

## 2022-01-15 ENCOUNTER — Inpatient Hospital Stay (HOSPITAL_BASED_OUTPATIENT_CLINIC_OR_DEPARTMENT_OTHER): Admitting: Hematology and Oncology

## 2022-01-15 ENCOUNTER — Inpatient Hospital Stay: Attending: Hematology and Oncology

## 2022-01-15 VITALS — BP 126/80 | HR 95 | Temp 97.5°F | Resp 14 | Wt 318.9 lb

## 2022-01-15 DIAGNOSIS — R109 Unspecified abdominal pain: Secondary | ICD-10-CM | POA: Insufficient documentation

## 2022-01-15 DIAGNOSIS — R790 Abnormal level of blood mineral: Secondary | ICD-10-CM | POA: Diagnosis not present

## 2022-01-15 DIAGNOSIS — E282 Polycystic ovarian syndrome: Secondary | ICD-10-CM | POA: Insufficient documentation

## 2022-01-15 DIAGNOSIS — Z803 Family history of malignant neoplasm of breast: Secondary | ICD-10-CM | POA: Diagnosis not present

## 2022-01-15 DIAGNOSIS — E611 Iron deficiency: Secondary | ICD-10-CM

## 2022-01-15 DIAGNOSIS — K5900 Constipation, unspecified: Secondary | ICD-10-CM | POA: Diagnosis not present

## 2022-01-15 LAB — CBC WITH DIFFERENTIAL (CANCER CENTER ONLY)
Abs Immature Granulocytes: 0.01 10*3/uL (ref 0.00–0.07)
Basophils Absolute: 0.1 10*3/uL (ref 0.0–0.1)
Basophils Relative: 1 %
Eosinophils Absolute: 0.1 10*3/uL (ref 0.0–0.5)
Eosinophils Relative: 1 %
HCT: 42.7 % (ref 36.0–46.0)
Hemoglobin: 14.8 g/dL (ref 12.0–15.0)
Immature Granulocytes: 0 %
Lymphocytes Relative: 26 %
Lymphs Abs: 2 10*3/uL (ref 0.7–4.0)
MCH: 29.6 pg (ref 26.0–34.0)
MCHC: 34.7 g/dL (ref 30.0–36.0)
MCV: 85.4 fL (ref 80.0–100.0)
Monocytes Absolute: 0.6 10*3/uL (ref 0.1–1.0)
Monocytes Relative: 8 %
Neutro Abs: 4.9 10*3/uL (ref 1.7–7.7)
Neutrophils Relative %: 64 %
Platelet Count: 314 10*3/uL (ref 150–400)
RBC: 5 MIL/uL (ref 3.87–5.11)
RDW: 11.9 % (ref 11.5–15.5)
WBC Count: 7.5 10*3/uL (ref 4.0–10.5)
nRBC: 0 % (ref 0.0–0.2)

## 2022-01-15 LAB — CMP (CANCER CENTER ONLY)
ALT: 10 U/L (ref 0–44)
AST: 11 U/L — ABNORMAL LOW (ref 15–41)
Albumin: 4.2 g/dL (ref 3.5–5.0)
Alkaline Phosphatase: 100 U/L (ref 38–126)
Anion gap: 8 (ref 5–15)
BUN: 9 mg/dL (ref 6–20)
CO2: 23 mmol/L (ref 22–32)
Calcium: 9.9 mg/dL (ref 8.9–10.3)
Chloride: 110 mmol/L (ref 98–111)
Creatinine: 0.65 mg/dL (ref 0.44–1.00)
GFR, Estimated: >60 mL/min (ref >60)
Glucose, Bld: 77 mg/dL (ref 70–99)
Potassium: 3.5 mmol/L (ref 3.5–5.1)
Sodium: 141 mmol/L (ref 135–145)
Total Bilirubin: 0.2 mg/dL — ABNORMAL LOW (ref 0.3–1.2)
Total Protein: 7.2 g/dL (ref 6.5–8.1)

## 2022-01-15 LAB — RETIC PANEL
Immature Retic Fract: 2.9 % (ref 2.3–15.9)
RBC.: 4.93 MIL/uL (ref 3.87–5.11)
Retic Count, Absolute: 80.4 10*3/uL (ref 19.0–186.0)
Retic Ct Pct: 1.6 % (ref 0.4–3.1)
Reticulocyte Hemoglobin: 33.6 pg (ref >27.9)

## 2022-01-15 LAB — IRON AND IRON BINDING CAPACITY (CC-WL,HP ONLY)
Iron: 50 ug/dL (ref 28–170)
Saturation Ratios: 15 % (ref 10.4–31.8)
TIBC: 344 ug/dL (ref 250–450)
UIBC: 294 ug/dL (ref 148–442)
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
## 2022-01-16 LAB — FERRITIN: Ferritin: 385 ng/mL — ABNORMAL HIGH (ref 11–307)

## 2022-01-18 DIAGNOSIS — H5213 Myopia, bilateral: Secondary | ICD-10-CM | POA: Diagnosis not present

## 2022-01-19 ENCOUNTER — Telehealth: Payer: Self-pay | Admitting: *Deleted

## 2022-01-19 NOTE — Telephone Encounter (Signed)
TCT patient's mother with pt's recent lab results.  Spoke with her and advised that her Hgb and Iron levels are strong. Recommend she continue PO iron therapy. We will see her back in 6 months to re-evaluate.   [name] voiced understanding. No questions or concerns.

## 2022-01-19 NOTE — Telephone Encounter (Signed)
-----   Message from Orson Slick, MD sent at 01/16/2022  8:32 AM EST ----- Please let Sarah Shah know that her Hgb and Iron levels are strong. Recommend she continue PO iron therapy. We will see her back in 6 months to re-evaluate.   ----- Message ----- From: Interface, Lab In [place] Sent: 01/15/2022   2:45 PM EST To: Orson Slick, MD

## 2022-01-20 ENCOUNTER — Ambulatory Visit (INDEPENDENT_AMBULATORY_CARE_PROVIDER_SITE_OTHER): Admitting: Clinical

## 2022-01-20 ENCOUNTER — Encounter: Payer: Self-pay | Admitting: Physician Assistant

## 2022-01-20 DIAGNOSIS — F840 Autistic disorder: Secondary | ICD-10-CM | POA: Diagnosis not present

## 2022-01-20 DIAGNOSIS — F419 Anxiety disorder, unspecified: Secondary | ICD-10-CM | POA: Diagnosis not present

## 2022-01-20 DIAGNOSIS — F32A Depression, unspecified: Secondary | ICD-10-CM | POA: Diagnosis not present
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
## 2022-01-21 ENCOUNTER — Encounter: Payer: Self-pay | Admitting: Physician Assistant

## 2022-01-22 ENCOUNTER — Encounter: Payer: Self-pay | Admitting: Physician Assistant

## 2022-01-23 ENCOUNTER — Ambulatory Visit: Admitting: Nurse Practitioner

## 2022-01-27 ENCOUNTER — Ambulatory Visit (INDEPENDENT_AMBULATORY_CARE_PROVIDER_SITE_OTHER): Admitting: Clinical

## 2022-01-27 ENCOUNTER — Encounter: Payer: Self-pay | Admitting: Physician Assistant

## 2022-01-27 ENCOUNTER — Encounter: Payer: Self-pay | Admitting: Family

## 2022-01-27 ENCOUNTER — Ambulatory Visit (INDEPENDENT_AMBULATORY_CARE_PROVIDER_SITE_OTHER): Admitting: Family

## 2022-01-27 DIAGNOSIS — F32A Depression, unspecified: Secondary | ICD-10-CM

## 2022-01-27 DIAGNOSIS — F913 Oppositional defiant disorder: Secondary | ICD-10-CM | POA: Diagnosis not present

## 2022-01-27 DIAGNOSIS — F902 Attention-deficit hyperactivity disorder, combined type: Secondary | ICD-10-CM

## 2022-01-27 DIAGNOSIS — Z9189 Other specified personal risk factors, not elsewhere classified: Secondary | ICD-10-CM | POA: Diagnosis not present

## 2022-01-27 DIAGNOSIS — Z6841 Body Mass Index (BMI) 40.0 and over, adult: Secondary | ICD-10-CM

## 2022-01-27 DIAGNOSIS — F3341 Major depressive disorder, recurrent, in partial remission: Secondary | ICD-10-CM

## 2022-01-27 DIAGNOSIS — G43809 Other migraine, not intractable, without status migrainosus: Secondary | ICD-10-CM

## 2022-01-27 DIAGNOSIS — G43119 Migraine with aura, intractable, without status migrainosus: Secondary | ICD-10-CM

## 2022-01-27 DIAGNOSIS — F418 Other specified anxiety disorders: Secondary | ICD-10-CM

## 2022-01-27 DIAGNOSIS — F419 Anxiety disorder, unspecified: Secondary | ICD-10-CM

## 2022-01-27 DIAGNOSIS — F840 Autistic disorder: Secondary | ICD-10-CM

## 2022-01-27 DIAGNOSIS — F332 Major depressive disorder, recurrent severe without psychotic features: Secondary | ICD-10-CM | POA: Diagnosis not present

## 2022-01-27 DIAGNOSIS — R454 Irritability and anger: Secondary | ICD-10-CM

## 2022-01-27 MED ORDER — FLUOXETINE HCL 40 MG PO CAPS: 40.0000 mg | ORAL_CAPSULE | Freq: Every day | ORAL | Status: DC

## 2022-01-27 MED ORDER — TIRZEPATIDE 2.5 MG/0.5ML ~~LOC~~ SOAJ: 2.5000 mg | SUBCUTANEOUS | 3 refills | Status: DC

## 2022-01-27 MED ORDER — BUSPIRONE HCL 10 MG PO TABS: ORAL_TABLET | ORAL | 1 refills | Status: DC

## 2022-01-27 MED ORDER — TOPIRAMATE ER 50 MG PO CAP24: 1.0000 | ORAL_CAPSULE | Freq: Every day | ORAL | 1 refills | Status: DC

## 2022-01-27 NOTE — Patient Instructions (Signed)
  Please call to see if you can get psychiatric services at any of these locations.   [facility] Phone # [phone] They have locations in [place], [place], [place], and [place]. They do take [employer] I know for sure and the [employer].   [facility] [phone]  [place], [place], [place] [address]    [facility] [place],  [address]   [phone]   If at any time you feel your needs are more urgent or you are concerned for your well being please see the below options:  For walk in options for mental health:   [facility] [address] in [place], [place]. Call our 24-hour HelpLine at ([phone])530-3358 or 7208654264 for immediate assistance for mental health and substance abuse issues.  And or walk into [facility] hospital ER.   [employer] Network: 1-800-SUICIDE  [employer] Suicide Prevention Lifeline: 1-800-273-TALK  Regards,   Eugenia Pancoast FNP-C

## 2022-01-27 NOTE — Progress Notes (Signed)
[facility] Counselor/Therapist Progress Note  Patient ID: Skarleth Delmonico, MRN: [id number]    Date: 01/27/22  Time Spent: 9:10 am - 9:58 am: 45 Minutes  Type of Service Provided Individual Therapy  Type of Contact in-person Location: office  Mental Status Exam: Appearance:  Casual and a bit disheveled     Behavior: Sharing and Drowsy  Motor: Normal  Speech/Language:  Clear and Coherent and Normal Rate  Affect: Typical for Addelyn  Mood: normal  Thought process: normal  Thought content:   WNL  Sensory/Perceptual disturbances:   WNL  Orientation: oriented to person, place, time/date, and situation  Attention: Fair  Concentration: Fair  Memory: WNL  Fund of knowledge:  Fair  Insight:   Fair  Judgment:  Fair  Impulse Control: Good   Risk Assessment:  Danger to Self:  No - denied SI between sessions  Self-injurious Behavior: No - denied self injurious behaviors between sessions Danger to Others: No Duty to Warn:no  Presenting Problems, Reported Symptoms, and /or Interim History: Amiya presented for a session to address mood, anxiety, and life stress.   Subjective: Jennetta and her mother presented for an individual outpatient therapy session. Mother and Alilah participated together for the beginning of the session, and Bryahna The following was addressed during sessions.   Magon's mother joined [place] for the initial portion of the session. Letitia provided verbal permission for her mother to participate.  Her mother reported improvement in the past week including reduced upsets and improved mood.  Some potential disconnects in communication between [place] and her mother were discussed, with therapist highlighting that often communication challenges are a result of individuals making assumptions about what the other person's statements or behaviors mean that are at times incorrect.  Asna rated her mood as a 9 out of 10 (with 1 being sad, 5 being neutral, and 10 being  happy). Akaila rated her anxiety as a 5 out of 10 and anger/frustration as a 4 out of 10 (with 10 being high).  She denied any suicidal thoughts or self-injurious behaviors between visits.  Anxiety was further explored with Brit identifying school as a large source of anxiety and therapist and Levia working together to address some of the aspects of school that were causing anxiety.  Coping strategies that Nashay can continue to use to help control mood and anxiety were also discussed.  Interventions/Psychotherapy Techniques Used During Session: Cognitive Behavioral Therapy and executive functioning strategies  Diagnosis: Depression, unspecified depression type  Autism spectrum disorder  Anxiety  MENTAL HEALTH INTERVENTIONS USED DURING TREATMENT & PATIENT'S RESPONSE TO INTERVENTIONS:  Short-term Objective addressed today:Ashyah will be able to use appropriate coping strategies including helpful thoughts to maintain a low level of anxiety and depressive symptoms. Mental health techniques used: Objective was addressed in session through the use of  Cognitive Behavioral Therapy and discussion. Jemma's response was positive.  Progress Toward Goal: Progressing  Short-term Objective addressed today: Deepti will be able to create a plan to address weaknesses described in objective 3A.   Mental health techniques used: Objective was addressed in session through the use of  executive functioning strategies , discussion, and making/reviewing lists. Cerita's response was positive.  Progress Toward Goal: Progressing    PLAN  1. Evvie will return for a therapy session.   2. Homework Given:  Keerat and her mother will begin to reflect on assumptions being made when they are having negative interactions, Carinna will implement the plan she developed for working on school work.  This homework will be reviewed with [name] at the next visit.  3. During the next session check in on mood, anxiety, life stress.     [name], [name] Individual Treatment Plan  - please see the notes from 04/17/2021 & 05/01/2021 for complete treatment plan information.  Problem/Need: Dorisann has a history of anxiety and mood concerns - with some ongoing challenges in these areas  Long-Term Goal #1: Kristyana will be able to identify and address any mood or anxiety challenges and will maintain a low level of anxiety and depression symptoms as evidenced by self-report. Short-Term Objectives: Objective 1A: Maisy will be able to rate her mood and anxiety  Objective 1B: Amneet will be able to identify any negative thinking patterns that are perpetuating anxiety or depressive symptoms and replace these with more adaptive thoughts.  Objective 1C: Robynn will be able to use appropriate coping strategies including helpful thoughts to maintain a low level of anxiety and depressive symptoms. Interventions: Cognitive Behavioral Therapy, Systems analyst, Motivational Interviewing, and Psycho-education/Bibliotherapy  coping skills and other evidenced-based practices will be used to promote progress towards healthy functioning and to help manage decrease symptoms associated with their diagnosis.  Treatment Regimen: Individual skill building sessions every 2-4 weeks to address treatment goal/objective Target Date: 04/2022 Responsible Party: therapist and patient Person delivering treatment: Licensed Psychologist [name], [name] will support the patient's ability to achieve the goals identified. Resolved:  No  Problem/Need: Life transition - Alethea is in the process of completing a major life transition including graduating from high school, starting college, and potentially working. Long-Term Goal #2: Savreen will successfully navigate these life transitions including being able to manage her stress level. Short-Term Objectives: Objective 2A: Kalisi will be able to identify stressors associated with the life transitions  Objective 2B: Analie will be able  to identify strategies to help navigate stressors associated with the life transitions and implement them  Interventions: Assertiveness/Communication, Systems analyst, and Psycho-education/Bibliotherapy , and other evidenced-based practices will be used to promote progress towards healthy functioning and to help manage decrease symptoms associated with their diagnosis.  Treatment Regimen: Individual skill building sessions every 2-4 weeks to address treatment goal/objective Target Date: 04/2022 Responsible Party: therapist and patient Person delivering treatment: Licensed Psychologist [name], [name] will support the patient's ability to achieve the goals identified. Resolved:  No  Problem/Need: Life skills - Alec needs to develop some additional skills to facilitate independent living. Long-Term Goal #3: Amilia will identify the additional life skills necessary to facilitate independent living. Short-Term Objectives: Objective 3A: Sacred will be able to identify areas that she needs to address to increase the likelihood of her being able to successfully live independently.   Objective 3B: Reene will be able to create a plan to address weaknesses described in objective 3A.   Objective 3C: Shaunna will be able to implement steps on the plan developed in objective 3B. Interventions: Motivational Interviewing, Solution-Oriented/Positive Psychology, and Psycho-education/Bibliotherapy  daily life skills, and other evidenced-based practices will be used to promote progress towards healthy functioning and to help manage decrease symptoms associated with their diagnosis.  Treatment Regimen: Individual skill building sessions every 2-4 weeks to address treatment goal/objective Target Date: 04/2022 Responsible Party: therapist and patient Person delivering treatment: Licensed Psychologist [name], [name] will support the patient's ability to achieve the goals identified. Resolved:  No   [name], [name]

## 2022-01-27 NOTE — Progress Notes (Addendum)
Established Patient Office Visit  Subjective:  Patient ID: Sarah Shah, female    DOB: 2000/08/05  Age: 22 y.o. MRN: [id number]  CC:  Chief Complaint  Patient presents with   Establish Care    Seen by Dr. Einar Pheasant     HPI [name] is here for a transition of care visit.  Prior provider was:Dr. Waunita Schooner  Pt is without acute concerns.   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 She states she had a poor experience with [facility] and doesn't feel she was taken seriously and they state they will not be going there anymore. She does see a therapist as well as goes to animal therapy as well.   Obesity: mounjaro, was taking this but was unable to keep getting approved and had to stop around summer time.   PCOS: sees endo about every three months. On chronic for PCOS.   Elevated LFTs: improving. Pt is seeing GI currently. sucrafate  Past Medical History:  Diagnosis Date   ADHD (attention deficit hyperactivity disorder)    Anxiety    Autism spectrum    Constipated    Constipation    Depression    Development delay    GERD (gastroesophageal reflux disease)    Insulin resistance    Iron deficiency    Learning disability    Obesity    ODD (oppositional defiant disorder)    PCOS (polycystic ovarian syndrome)    PMDD (premenstrual dysphoric disorder)    Pneumonia    3 mos old and 50 mos old   Visual acuity reduced    glasses    Past Surgical History:  Procedure Laterality Date   BIOPSY  02/17/2021   Procedure: BIOPSY;  Surgeon: Irene Shipper, MD;  Location: [facility];  Service: Endoscopy;;   COLONOSCOPY WITH PROPOFOL N/A 02/17/2021   Procedure: COLONOSCOPY WITH PROPOFOL;  Surgeon: Irene Shipper, MD;  Location: [facility];  Service: Endoscopy;  Laterality: N/A;   ESOPHAGOGASTRODUODENOSCOPY  (EGD) WITH PROPOFOL N/A 02/17/2021   Procedure: ESOPHAGOGASTRODUODENOSCOPY (EGD) WITH PROPOFOL;  Surgeon: Irene Shipper, MD;  Location: [facility];  Service: Endoscopy;  Laterality: N/A;   TOOTH EXTRACTION N/A 01/03/2018   Procedure: SURGICAL EXTRACTION OF TEETH #1, 16, 17, 32;  Surgeon: Michael Litter, DMD;  Location: [facility];  Service: Oral Surgery;  Laterality: N/A;    Family History  Problem Relation Age of Onset   Diabetes Mother    Hypertension Mother    Anxiety disorder Mother    Other Mother        Premenstrual dysphoic disorder   Obesity Mother    Stroke Mother    Colon polyps Mother    Irritable bowel syndrome Mother    Diabetes Father        type 1   Depression Father    Anxiety disorder Father    Obesity Father    Anxiety disorder Sister    ADD / ADHD Sister        ADD   Other Sister        Premenstrual dysphoric disorder   Atrial fibrillation Maternal Grandmother    Diabetes Maternal Grandmother    Heart failure Maternal Grandmother    Hypertension Maternal Grandfather    Iron deficiency Maternal Aunt    Breast cancer Maternal Aunt     Social  History   Socioeconomic History   Marital status: Single    Spouse name: Not on file   Number of children: 0   Years of education: Not on file   Highest education level: Not on file  Occupational History   Occupation: student  Tobacco Use   Smoking status: Never   Smokeless tobacco: Never  Vaping Use   Vaping Use: Never used  Substance and Sexual Activity   Alcohol use: No   Drug use: Never   Sexual activity: Never  Other Topics Concern   Not on file  Social History Narrative   Will start 12th grade at [employer] for the 22/23 school year.       07/20/19   Enjoys: sleep, paints, sewing (sock dolls), animal care   From: born in [place] but moved her when she was little   Who is at home: with mom Stanton Kidney, stepdad Thayer Ohm, and sister [place]   Pets: loves her [place] pigs, Careers information officer, ducks, dogs, classroom  bunny   School: [employer]    Grade: 11th grade this fall      Family: good relationship with mom and family       Exercise: walking with mom    Diet: avoiding reflux worsening food      Safety   Seat belts: Yes    Guns: Yes  and secure   Safe in relationships: Yes    Helmets: Yes    Smoke Exposure at home: No   Bullying: Yes  and knows who she can ask for help and feels   Social Determinants of Health   Financial Resource Strain: Not on file  Food Insecurity: No Food Insecurity (07/03/2020)   Hunger Vital Sign    Worried About Running Out of Food in the Last Year: Never true    Ran Out of Food in the Last Year: Never true  Transportation Needs: Not on file  Physical Activity: Not on file  Stress: Not on file  Social Connections: Not on file  Intimate Partner Violence: Not on file    Outpatient Medications Prior to Visit  Medication Sig Dispense Refill   Amphetamine Sulfate (EVEKEO) 10 MG TABS Take 2 tabs in AM, and 2 tabs at lunch. May take 1 tab at 4 PM PRN 150 tablet 0   blood glucose meter kit and supplies KIT Dispense based on patient and insurance preference. Use daily as directed. 1 each 0   cloNIDine HCl (KAPVAY) 0.1 MG TB12 ER tablet Take 2 tablets (0.2 mg total) by mouth 2 (two) times daily. Take 2 tablets by mouth 2 times daily (Patient taking differently: Take 0.2 mg by mouth 2 (two) times daily.) 360 tablet 1   desogestrel-ethinyl estradiol (VIORELE) 0.15-0.02/0.01 MG (21/5) tablet Take 1 tablet by mouth daily. Please give active pills only. Skip placebo pills. 140 tablet 3   dicyclomine (BENTYL) 20 MG tablet Take 1 tablet (20 mg total) by mouth every 6 (six) hours. (Patient taking differently: Take 20 mg by mouth every 6 (six) hours as needed (stomach pain).) 60 tablet 0   docusate sodium (COLACE) 100 MG capsule Take 100 mg by mouth at bedtime.     ferrous sulfate 325 (65 FE) MG tablet Take 325 mg by mouth at bedtime.     omeprazole (PRILOSEC) 40 MG  capsule Take 40 mg by mouth daily.     ondansetron (ZOFRAN ODT) 4 MG disintegrating tablet Take 1 tablet (4 mg total) by mouth every 8 (eight) hours  as needed for nausea or vomiting. 20 tablet 0   vitamin C (ASCORBIC ACID) 500 MG tablet Take 500 mg by mouth at bedtime.     Vitamin D, Ergocalciferol, (DRISDOL) 1.25 MG (50000 UNIT) CAPS capsule Take 50,000 Units by mouth once a week.     acetaminophen (TYLENOL) 500 MG tablet Take 1,000 mg by mouth every 8 (eight) hours as needed for moderate pain.     busPIRone (BUSPAR) 10 MG tablet 20 mg in the morning, 20 mg at lunch, 10-20 mg at 4p as needed, and can take up to 60 mg total daily (Patient taking differently: Take 10-20 mg by mouth See admin instructions. 20 mg in the morning, 20 mg at lunch, 10-20 mg at 1600 as needed for anxiety.) 540 tablet 1   FLUoxetine (PROZAC) 40 MG capsule Take 1 capsule (40 mg total) by mouth daily. TAKE 1 CAPSULE(40 MG) BY MOUTH DAILY (Patient taking differently: Take 40 mg by mouth daily.) 90 capsule 3   sucralfate (CARAFATE) 1 g tablet Take 1 G tablet three times a day before meals for 21 days. 63 tablet 0   tirzepatide (MOUNJARO) 2.5 MG/0.5ML Pen Inject 2.5 mg into the skin once a week. 2 mL 3   Topiramate ER (TROKENDI XR) 50 MG CP24 Take 1 capsule by mouth daily. (Patient taking differently: Take 50 mg by mouth daily.) 30 capsule 1   diphenhydrAMINE (SOMINEX) 25 MG tablet Take 25 mg by mouth at bedtime as needed for sleep.     ondansetron (ZOFRAN) 4 MG tablet Take 1 tablet (4 mg total) by mouth every 6 (six) hours. 12 tablet 0   No facility-administered medications prior to visit.    No Known Allergies  ROS Review of Systems  Negative unless otherwise addressed in HPI    Objective:    Physical Exam  Gen: NAD, resting comfortably CV: RRR with no murmurs appreciated Pulm: NWOB, CTAB with no crackles, wheezes, or rhonchi Skin: warm, dry Psych: Normal affect and thought content  BP 124/72   Pulse 80    Temp 98.3 F (36.8 C) (Oral)   Ht 5\' 4"  (1.626 m)   Wt (!) 318 lb (144.2 kg)   SpO2 98%   BMI 54.58 kg/m  Wt Readings from Last 3 Encounters:  01/27/22 (!) 318 lb (144.2 kg)  01/15/22 (!) 318 lb 14.4 oz (144.7 kg)  12/24/21 (!) 319 lb (144.7 kg)     Health Maintenance Due  Topic Date Due   HIV Screening  Never done   Hepatitis C Screening  Never done   COVID-19 Vaccine (3 - Pfizer risk series) 10/04/2019    There are no preventive care reminders to display for this patient.  Lab Results  Component Value Date   TSH 3.510 04/16/2021   Lab Results  Component Value Date   WBC 7.5 01/15/2022   HGB 14.8 01/15/2022   HCT 42.7 01/15/2022   MCV 85.4 01/15/2022   PLT 314 01/15/2022   Lab Results  Component Value Date   NA 141 01/15/2022   K 3.5 01/15/2022   CO2 23 01/15/2022   GLUCOSE 77 01/15/2022   BUN 9 01/15/2022   CREATININE 0.65 01/15/2022   BILITOT 0.2 (L) 01/15/2022   ALKPHOS 100 01/15/2022   AST 11 (L) 01/15/2022   ALT 10 01/15/2022   PROT 7.2 01/15/2022   ALBUMIN 4.2 01/15/2022   CALCIUM 9.9 01/15/2022   ANIONGAP 8 01/15/2022   EGFR 130 04/16/2021   GFR 123.66 04/27/2019  Lab Results  Component Value Date   CHOL 164 07/18/2019   Lab Results  Component Value Date   HDL 52 07/18/2019   Lab Results  Component Value Date   LDLCALC 86 07/18/2019   Lab Results  Component Value Date   TRIG 150 (H) 07/18/2019   No results found for: "CHOLHDL" Lab Results  Component Value Date   HGBA1C 5.3 12/01/2021      Assessment & Plan:   Morbid obesity with BMI of 50.0-59.9, adult (HCC) -     Tirzepatide; Inject 2.5 mg into the skin once a week.  Dispense: 2 mL; Refill: 3  Anxiety and depression -     Ambulatory referral to Psychiatry -     busPIRone HCl; 20 mg in the morning, 20 mg at lunch, 10-20 mg at 4p as needed, and can take up to 60 mg total daily  Dispense: 540 tablet; Refill: 1  Other migraine without status migrainosus, not intractable -      Topiramate ER; Take 1 capsule by mouth daily.  Dispense: 30 capsule; Refill: 1  Other specified anxiety disorders -     FLUoxetine HCl; Take 1 capsule (40 mg total) by mouth daily.  Recurrent major depressive disorder, in partial remission (HCC) -     Ambulatory referral to Psychiatry -     FLUoxetine HCl; Take 1 capsule (40 mg total) by mouth daily.  At risk for diabetes mellitus -     Tirzepatide; Inject 2.5 mg into the skin once a week.  Dispense: 2 mL; Refill: 3  Oppositional defiant disorder with chronic irritability and anger -     Ambulatory referral to Psychiatry  ADHD (attention deficit hyperactivity disorder), combined type -     Ambulatory referral to Psychiatry  Autism spectrum disorder -     Ambulatory referral to Psychiatry  Intractable migraine with aura without status migrainosus  Class 3 severe obesity due to excess calories with serious comorbidity and body mass index (BMI) of 50.0 to 59.9 in adult [facility]) Assessment & Plan: Long d/w pt on weight loss medication options as well as a plan for exercising and diet changes, and goals that we can implement to meet desired weight loss goals. Pt advised to start a healthy diet and exercise program alongside taking this medication. Printout given for healthy eating and exercise recommendations.   Did advise pt that I will prescribe mounjaro 2.5mg . If this is not approved pt was advised I will have to refer her to weight loss clinic where they may be able to provide alternative options.   Discussed with patient various weight loss medications we could try to apply for with insurance.  Also advised her this may not be approved, and we can try for prior auth and it still may not be approved.  If not approved, we will need to refer to weight loss medical clinic.  If approved, advised pt to f/u within three months after starting to document weight loss and also document how pt is tolerating medication.     Severe episode of  recurrent major depressive disorder, without psychotic features [facility]) Assessment & Plan: Long discussion with patient currently seeing psychology and doing well Did advise patient if any SI HI and/or self-mutilation please seek immediate help whether ER or call 911. Continue medications as prescribed Did refer patient to psychiatry for further evaluation      Meds ordered this encounter  Medications   busPIRone (BUSPAR) 10 MG tablet    Sig:  20 mg in the morning, 20 mg at lunch, 10-20 mg at 4p as needed, and can take up to 60 mg total daily    Dispense:  540 tablet    Refill:  1    Order Specific Question:   Supervising Provider    Answer:   BEDSOLE, [name] E [[id number]]   Topiramate ER (TROKENDI XR) 50 MG CP24    Sig: Take 1 capsule by mouth daily.    Dispense:  30 capsule    Refill:  1    Order Specific Question:   Supervising Provider    Answer:   BEDSOLE, [name] E [[id number]]   FLUoxetine (PROZAC) 40 MG capsule    Sig: Take 1 capsule (40 mg total) by mouth daily.    Order Specific Question:   Supervising Provider    Answer:   Ermalene Searing, [name] E [[id number]]   tirzepatide (MOUNJARO) 2.5 MG/0.5ML Pen    Sig: Inject 2.5 mg into the skin once a week.    Dispense:  2 mL    Refill:  3    This prescription is for ZepBound which is not yet available in Epic for prescribing. Please fill when available in retail pharmacy.    Order Specific Question:   Supervising Provider    Answer:   Ermalene Searing, [name] E [[id number]]    Follow-up: No follow-ups on file.    Mort Sawyers, FNP

## 2022-01-28 ENCOUNTER — Encounter: Payer: Self-pay | Admitting: *Deleted

## 2022-01-28 DIAGNOSIS — G43909 Migraine, unspecified, not intractable, without status migrainosus: Secondary | ICD-10-CM | POA: Diagnosis not present

## 2022-01-28 DIAGNOSIS — F411 Generalized anxiety disorder: Secondary | ICD-10-CM | POA: Diagnosis not present

## 2022-01-28 DIAGNOSIS — E282 Polycystic ovarian syndrome: Secondary | ICD-10-CM | POA: Diagnosis not present

## 2022-01-28 DIAGNOSIS — D509 Iron deficiency anemia, unspecified: Secondary | ICD-10-CM | POA: Diagnosis not present

## 2022-01-28 DIAGNOSIS — F909 Attention-deficit hyperactivity disorder, unspecified type: Secondary | ICD-10-CM | POA: Diagnosis not present

## 2022-01-28 NOTE — Assessment & Plan Note (Addendum)
Long d/w pt on weight loss medication options as well as a plan for exercising and diet changes, and goals that we can implement to meet desired weight loss goals. Pt advised to start a healthy diet and exercise program alongside taking this medication. Printout given for healthy eating and exercise recommendations.   Did advise pt that I will prescribe mounjaro 2.5mg . If this is not approved pt was advised I will have to refer her to weight loss clinic where they may be able to provide alternative options.   Discussed with patient various weight loss medications we could try to apply for with insurance.  Also advised her this may not be approved, and we can try for prior auth and it still may not be approved.  If not approved, we will need to refer to weight loss medical clinic.  If approved, advised pt to f/u within three months after starting to document weight loss and also document how pt is tolerating medication.

## 2022-01-28 NOTE — Assessment & Plan Note (Signed)
Long discussion with patient currently seeing psychology and doing well Did advise patient if any SI HI and/or self-mutilation please seek immediate help whether ER or call 911. Continue medications as prescribed Did refer patient to psychiatry for further evaluation

## 2022-01-30 ENCOUNTER — Other Ambulatory Visit (INDEPENDENT_AMBULATORY_CARE_PROVIDER_SITE_OTHER): Payer: Self-pay | Admitting: Nurse Practitioner

## 2022-01-30 ENCOUNTER — Encounter: Payer: Self-pay | Admitting: Physician Assistant

## 2022-02-03 ENCOUNTER — Ambulatory Visit (INDEPENDENT_AMBULATORY_CARE_PROVIDER_SITE_OTHER): Admitting: Clinical

## 2022-02-03 DIAGNOSIS — F32A Depression, unspecified: Secondary | ICD-10-CM | POA: Diagnosis not present

## 2022-02-03 DIAGNOSIS — F840 Autistic disorder: Secondary | ICD-10-CM

## 2022-02-03 DIAGNOSIS — F419 Anxiety disorder, unspecified: Secondary | ICD-10-CM | POA: Diagnosis not present

## 2022-02-03 NOTE — Progress Notes (Signed)
[facility] Counselor/Therapist Progress Note  Patient ID: Sarah Shah, MRN: [id number]    Date: 02/03/22  Time Spent: 11:08 am - 12:04 pm: 56 Minutes  Type of Service Provided Individual Therapy  Type of Contact in-person Location: office   Mental Status Exam: Appearance:  Casual     Behavior: Sharing  Motor: Normal  Speech/Language:  Clear and Coherent and Normal Rate  Affect: Flat somewhat but typical for Kina   Mood: normal, a bit tired   Thought process: normal  Thought content:   WNL  Sensory/Perceptual disturbances:   WNL  Orientation: oriented to person, place, time/date, and situation  Attention: Fair  Concentration: Fair  Memory: WNL  Fund of knowledge:  Fair  Insight:   Fair  Judgment:  Fair  Impulse Control: Fair   Risk Assessment:  Danger to Self:  No - denied SI since last visit Self-injurious Behavior: No - denied SIB since last visit.  Danger to Others: No Duty to Warn:no  Presenting Problems, Reported Symptoms, and /or Interim History: Sarah Shah presented for a visit to address mood, anxiety, and life stress.   Subjective: Sarah Shah presented for an individual outpatient therapy session. The following was addressed during sessions.   Sarah Shah rated her mood as a 5 out of 10 (with 1 being sad, 5 being neutral, and 10 being happy).  Sarah Shah rated her anxiety as a 5 out of 10 and her anger and frustration as a 5 out of 10 (with 10 being high).  She reported that her sleep has been poor, but was not sure if this was related to a change in medication. Communication with her mother has reportedly improved. Therapist provided psycho education about sleep, including sleep hygiene, having a consistent wake and sleep time and strategies to use if she is unable to go to sleep. Sarah Shah expressed interest in using some of the strategies discussed. School has been going well and Sarah Shah reported she has been independently completing some of her work. Strategies to  maintain this was discussed. Plans for increasing contact between Sarah Shah and her recent friend group were discussed. Ways to possibly expand social opportunities and life skill opportunities was discussed.   Interventions/Psychotherapy Techniques Used During Session: Cognitive Behavioral Therapy, Solution-Oriented/Positive Psychology, and Psycho-education/Bibliotherapy  Diagnosis: Depression, unspecified depression type  Autism spectrum disorder  Anxiety  MENTAL HEALTH INTERVENTIONS USED DURING TREATMENT & PATIENT'S RESPONSE TO INTERVENTIONS:  Short-term Objective addressed today:Sarah Shah will be able to identify any negative thinking patterns that are perpetuating anxiety or depressive symptoms and replace these with more adaptive thoughts.  Mental health techniques used: Objective was addressed in session through the use of Cognitive Behavioral Therapy, Solution-Oriented/Positive Psychology, and Psycho-education/Bibliotherapy and discussion. Analyah's response was positive.  Progress Toward Goal: progressing  Short-term Objective addressed today:Sarah Shah will be able to identify areas that she needs to address to increase the likelihood of her being able to successfully live independently.  AND Sarah Shah will be able to create a plan to address weaknesses described in objective 3A.   Mental health techniques used: Objective was addressed in session through the use of Cognitive Behavioral Therapy, Solution-Oriented/Positive Psychology, and Psycho-education/Bibliotherapy and discussion. Sarah Shah's response was positive.  Progress Toward Goal: progressing     PLAN  1. Sarah Shah will return for a therapy session.   2. Homework Given:  try to develop a consistent sleep-wake time and record sleep-wake times, develop a list of activities to do when unable to sleep, text her friends twice per week and think  about how an outing could happen, learn more about the animal club at school and volunteering opportunities with  animals. This homework will be reviewed with Sarah Shah at the next visit.  3. During the next session check in on mood, anxiety, SI/SIB and life stress.     Sarah Chess, [name]  Individual Treatment Plan  - please see the notes from 04/17/2021 & 05/01/2021 for complete treatment plan information.  Problem/Need: Sarah Shah has a history of anxiety and mood concerns - with some ongoing challenges in these areas  Long-Term Goal #1: Sarah Shah will be able to identify and address any mood or anxiety challenges and will maintain a low level of anxiety and depression symptoms as evidenced by self-report. Short-Term Objectives: Objective 1A: Sarah Shah will be able to rate her mood and anxiety  Objective 1B: Sarah Shah will be able to identify any negative thinking patterns that are perpetuating anxiety or depressive symptoms and replace these with more adaptive thoughts.  Objective 1C: Sarah Shah will be able to use appropriate coping strategies including helpful thoughts to maintain a low level of anxiety and depressive symptoms. Interventions: Cognitive Behavioral Therapy, Systems analyst, Motivational Interviewing, and Psycho-education/Bibliotherapy  coping skills and other evidenced-based practices will be used to promote progress towards healthy functioning and to help manage decrease symptoms associated with their diagnosis.  Treatment Regimen: Individual skill building sessions every 2-4 weeks to address treatment goal/objective Target Date: 04/2022 Responsible Party: therapist and patient Person delivering treatment: Licensed Psychologist Sarah Chess, [name] will support the patient's ability to achieve the goals identified. Resolved:  No  Problem/Need: Life transition - Sarah Shah is in the process of completing a major life transition including graduating from high school, starting college, and potentially working. Long-Term Goal #2: Sarah Shah will successfully navigate these life transitions including being able to manage her  stress level. Short-Term Objectives: Objective 2A: Sarah Shah will be able to identify stressors associated with the life transitions  Objective 2B: Sarah Shah will be able to identify strategies to help navigate stressors associated with the life transitions and implement them  Interventions: Assertiveness/Communication, Systems analyst, and Psycho-education/Bibliotherapy , and other evidenced-based practices will be used to promote progress towards healthy functioning and to help manage decrease symptoms associated with their diagnosis.  Treatment Regimen: Individual skill building sessions every 2-4 weeks to address treatment goal/objective Target Date: 04/2022 Responsible Party: therapist and patient Person delivering treatment: Licensed Psychologist Sarah Chess, [name] will support the patient's ability to achieve the goals identified. Resolved:  No  Problem/Need: Life skills - Naarah needs to develop some additional skills to facilitate independent living. Long-Term Goal #3: Sabel will identify the additional life skills necessary to facilitate independent living. Short-Term Objectives: Objective 3A: Cambrey will be able to identify areas that she needs to address to increase the likelihood of her being able to successfully live independently.   Objective 3B: Nija will be able to create a plan to address weaknesses described in objective 3A.   Objective 3C: Shalay will be able to implement steps on the plan developed in objective 3B. Interventions: Motivational Interviewing, Solution-Oriented/Positive Psychology, and Psycho-education/Bibliotherapy  daily life skills, and other evidenced-based practices will be used to promote progress towards healthy functioning and to help manage decrease symptoms associated with their diagnosis.  Treatment Regimen: Individual skill building sessions every 2-4 weeks to address treatment goal/objective Target Date: 04/2022 Responsible Party: therapist and  patient Person delivering treatment: Licensed Psychologist Sarah Chess, [name] will support the patient's ability to achieve the goals identified. Resolved:  No  Sarah Chess, [name]

## 2022-02-06 ENCOUNTER — Ambulatory Visit: Payer: Medicaid Other | Admitting: Adult Health

## 2022-02-10 ENCOUNTER — Encounter: Payer: Self-pay | Admitting: Physician Assistant

## 2022-02-10 ENCOUNTER — Ambulatory Visit (INDEPENDENT_AMBULATORY_CARE_PROVIDER_SITE_OTHER): Admitting: Clinical

## 2022-02-10 DIAGNOSIS — F840 Autistic disorder: Secondary | ICD-10-CM

## 2022-02-10 DIAGNOSIS — F32A Depression, unspecified: Secondary | ICD-10-CM | POA: Diagnosis not present

## 2022-02-10 DIAGNOSIS — F419 Anxiety disorder, unspecified: Secondary | ICD-10-CM

## 2022-02-10 NOTE — Progress Notes (Signed)
[facility] Counselor/Therapist Progress Note  Patient ID: Sarah Shah, MRN: [id number]    Date: 02/10/22  Time Spent: 11:00 am - 11:50 am: 50 Minutes  Type of Service Provided Individual Therapy  Type of Contact in-person Location: office  Mental Status Exam: Appearance:  Casual and a bit disheveled      Behavior: Appropriate, Sharing, and Drowsy  Motor: Normal  Speech/Language:  Clear and Coherent and Normal Rate  Affect: Flat but not atypical for client   Mood: normal  Thought process: normal  Thought content:   WNL  Sensory/Perceptual disturbances:   WNL  Orientation: oriented to person, place, and situation  Attention: Fair  Concentration: Fair  Memory: [place] of knowledge:  Fair  Insight:   Fair  Judgment:  Fair  Impulse Control: Good   Risk Assessment:  Danger to Self:  No - denied SI Self-injurious Behavior: No - Denied SIB Danger to Others: No Duty to Warn:no  Presenting Problems, Reported Symptoms, and /or Interim History: Jaclyne presented for a session to address mood, anxiety and life stress.   Subjective: Jaquanna presented for an individual outpatient therapy session. The following was addressed during sessions.   Yesica reported that things were going well overall. Darah rated her mood as a 5 out of 10 (with 1 being sad, 5 being neutral, and 10 being happy). Natally rated her anxiety as a 4 out of 10 and her anger/frustration as a 5 out of 10 (with 10 being high). School is going well. Sandeep and therapist problem solved around executive functioning strategies and work completion. Vangie discussed some test anxiety (rating her anxiety before a recent test as a 9 out of 10, and anxiety during as a 10 out of 10, which was related to several stressful things that happened to her when she was taking her test). Strategies to manage stress before tests as well as strategies for managing stressful experience happen during were discussed. Communication  with her parent was discussed with [name] reporting improvement. She discussed several health issues that her family members are having that are contributing to her stress and anxiety. Therapist and Ruthene discussed ways to manage this. Estellar is working on getting a learners permit for driving and this was discussed.     Interventions/Psychotherapy Techniques Used During Session: Cognitive Behavioral Therapy, Assertiveness/Communication, Solution-Oriented/Positive Psychology, and Insurance account manager Strategies  Diagnosis: Depression, unspecified depression type  Autism spectrum disorder  Anxiety  MENTAL HEALTH INTERVENTIONS USED DURING TREATMENT & PATIENT'S RESPONSE TO INTERVENTIONS:  Short-term Objective addressed today:Quintella will be able to identify any negative thinking patterns that are perpetuating anxiety or depressive symptoms and replace these with more adaptive thoughts. AND [name] will be able to use appropriate coping strategies including helpful thoughts to maintain a low level of anxiety and depressive symptoms. Mental health techniques used: Objective was addressed in session through the use of Cognitive Behavioral Therapy, Assertiveness/Communication, Solution-Oriented/Positive Psychology, and Executive Functioning Strategies , and discussion. Alyssandra's response was positive.  Progress Toward Goal: progressing     PLAN  1. Gayleen will return for a therapy session.   2. Homework Given:  try new plan for work completion, monitor stress and make sure that she is doing something daily to reduced stress levels. This homework will be reviewed with [name] at the next visit.  3. During the next session school performance and work completion, anxiety, mood.     [name], [name] Individual Treatment Plan  - please see the notes from 04/17/2021 &  05/01/2021 for complete treatment plan information.  Problem/Need: Shanequia has a history of anxiety and mood concerns - with some ongoing challenges in  these areas  Long-Term Goal #1: Danetta will be able to identify and address any mood or anxiety challenges and will maintain a low level of anxiety and depression symptoms as evidenced by self-report. Short-Term Objectives: Objective 1A: Kinberly will be able to rate her mood and anxiety  Objective 1B: Yanelis will be able to identify any negative thinking patterns that are perpetuating anxiety or depressive symptoms and replace these with more adaptive thoughts.  Objective 1C: Tima will be able to use appropriate coping strategies including helpful thoughts to maintain a low level of anxiety and depressive symptoms. Interventions: Cognitive Behavioral Therapy, Systems analyst, Motivational Interviewing, and Psycho-education/Bibliotherapy  coping skills and other evidenced-based practices will be used to promote progress towards healthy functioning and to help manage decrease symptoms associated with their diagnosis.  Treatment Regimen: Individual skill building sessions every 2-4 weeks to address treatment goal/objective Target Date: 04/2022 Responsible Party: therapist and patient Person delivering treatment: Licensed Psychologist [name], [name] will support the patient's ability to achieve the goals identified. Resolved:  No  Problem/Need: Life transition - Geni is in the process of completing a major life transition including graduating from high school, starting college, and potentially working. Long-Term Goal #2: Valoree will successfully navigate these life transitions including being able to manage her stress level. Short-Term Objectives: Objective 2A: Glendia will be able to identify stressors associated with the life transitions  Objective 2B: Nasiyah will be able to identify strategies to help navigate stressors associated with the life transitions and implement them  Interventions: Assertiveness/Communication, Systems analyst, and Psycho-education/Bibliotherapy , and other  evidenced-based practices will be used to promote progress towards healthy functioning and to help manage decrease symptoms associated with their diagnosis.  Treatment Regimen: Individual skill building sessions every 2-4 weeks to address treatment goal/objective Target Date: 04/2022 Responsible Party: therapist and patient Person delivering treatment: Licensed Psychologist [name], [name] will support the patient's ability to achieve the goals identified. Resolved:  No  Problem/Need: Life skills - Cortnie needs to develop some additional skills to facilitate independent living. Long-Term Goal #3: Jadalee will identify the additional life skills necessary to facilitate independent living. Short-Term Objectives: Objective 3A: Rosalea will be able to identify areas that she needs to address to increase the likelihood of her being able to successfully live independently.   Objective 3B: Emer will be able to create a plan to address weaknesses described in objective 3A.   Objective 3C: Inetha will be able to implement steps on the plan developed in objective 3B. Interventions: Motivational Interviewing, Solution-Oriented/Positive Psychology, and Psycho-education/Bibliotherapy  daily life skills, and other evidenced-based practices will be used to promote progress towards healthy functioning and to help manage decrease symptoms associated with their diagnosis.  Treatment Regimen: Individual skill building sessions every 2-4 weeks to address treatment goal/objective Target Date: 04/2022 Responsible Party: therapist and patient Person delivering treatment: Licensed Psychologist [name], [name] will support the patient's ability to achieve the goals identified. Resolved:  No     [name], [name]

## 2022-02-16 DIAGNOSIS — F913 Oppositional defiant disorder: Secondary | ICD-10-CM | POA: Insufficient documentation

## 2022-02-16 DIAGNOSIS — F411 Generalized anxiety disorder: Secondary | ICD-10-CM | POA: Insufficient documentation

## 2022-02-16 DIAGNOSIS — F341 Dysthymic disorder: Secondary | ICD-10-CM | POA: Insufficient documentation

## 2022-02-16 DIAGNOSIS — F909 Attention-deficit hyperactivity disorder, unspecified type: Secondary | ICD-10-CM | POA: Insufficient documentation

## 2022-02-16 DIAGNOSIS — F840 Autistic disorder: Secondary | ICD-10-CM | POA: Insufficient documentation

## 2022-02-18 ENCOUNTER — Encounter: Payer: Self-pay | Admitting: Physician Assistant

## 2022-02-23 ENCOUNTER — Ambulatory Visit: Admitting: Nurse Practitioner

## 2022-02-24 ENCOUNTER — Ambulatory Visit: Payer: Medicaid Other | Admitting: Adult Health

## 2022-02-26 ENCOUNTER — Ambulatory Visit (INDEPENDENT_AMBULATORY_CARE_PROVIDER_SITE_OTHER): Admitting: Clinical

## 2022-02-26 DIAGNOSIS — F840 Autistic disorder: Secondary | ICD-10-CM | POA: Diagnosis not present

## 2022-02-26 DIAGNOSIS — F32A Depression, unspecified: Secondary | ICD-10-CM | POA: Diagnosis not present

## 2022-02-26 DIAGNOSIS — F419 Anxiety disorder, unspecified: Secondary | ICD-10-CM | POA: Diagnosis not present

## 2022-02-26 NOTE — Progress Notes (Signed)
Sarah Shah Progress Note  Patient ID: Sarah Shah, MRN: [id number]    Date: 02/26/22  Time Spent: 11:15 am - 11:58 am: 23 Minutes  Type of Service Provided Individual Therapy  Type of Contact in-person Location: office  Mental Status Exam: Appearance:  Casual     Behavior: Appropriate  Motor: Normal  Speech/Language:  Clear and Coherent  Affect: Appropriate; a bit flat but not out of the ordinary for client  Mood: normal  Thought process: normal  Thought content:   WNL  Sensory/Perceptual disturbances:   WNL  Orientation: oriented to person, place, and situation  Attention: Fair  Concentration: Fair  Memory: [place] of knowledge:  Fair  Insight:   Fair  Judgment:  Fair  Impulse Control: Fair   Risk Assessment:  Danger to Self:  No - denied SI Self-injurious Behavior: No - denied SIB Danger to Others: No Duty to Warn:no  Presenting Problems, Reported Symptoms, and /or Interim History: Sarah Shah presented for a session to address life stress.   Subjective: Sarah Shah presented for an individual outpatient therapy session. The following was addressed during sessions.   Sarah Shah reported that things are going well. Sarah Shah rated her mood as a 5 out of 10 (with 1 being sad, 5 being neutral, and 10 being happy).  Sarah Shah rated her anxiety as a 5 out of 10 and anger/frustration as a 5 out of 10 (with 10 being high).     Sarah Shah reported that she has been appropriately managing school. She reported that she has been contemplating moving out of her parent's house and in with her sister. Life skills necessary to live independently were discussed (e.g., money management skills). Strategies for getting the information needed to make an informed decision, as well as having a plan incase she chooses to move out and it does not work out were discussed. Sarah Shah reported feeling some stress after laying out the different areas that she would need to feel confident in  her ability to manage with there therapist. Sarah Shah will talk with her mother and sister to develop a plan and make a decision about pursuing moving out now versus later.   Interventions/Psychotherapy Techniques Used During Session: Solution-Oriented/Positive Psychology and Psycho-education/Bibliotherapy and executive functioning strategies   Diagnosis: Autism spectrum disorder  Depression, unspecified depression type  Anxiety  MENTAL HEALTH INTERVENTIONS USED DURING TREATMENT & PATIENT'S RESPONSE TO INTERVENTIONS:  Short-term Objective addressed today:Sarah Shah will be able to identify areas that she needs to address to increase the likelihood of her being able to successfully live independently.  AND Sarah Shah will be able to create a plan to address weaknesses described in objective 3A.   Mental health techniques used: Objective was addressed in session through the use of Solution-Oriented/Positive Psychology, Psycho-education/Bibliotherapy, and executive functioning strategies,  and discussion. Sarah Shah's response was positive.  Progress Toward Goal: some progress     PLAN  1. Sarah Shah will return for a therapy session.   2. Homework Given:  talk with mom and sister about the potential move as well as necessary skills to address this. This homework will be reviewed with Sarah Shah at the next visit.  3. During the next session check in on mood, anxiety, life plan.     Sarah Shah, Sarah Shah  Individual Treatment Plan  - please see the notes from 04/17/2021 & 05/01/2021 for complete treatment plan information.  Problem/Need: Sarah Shah has a history of anxiety and mood concerns - with some ongoing challenges in these areas  Long-Term  Goal #1: [name] will be able to identify and address any mood or anxiety challenges and will maintain a low level of anxiety and depression symptoms as evidenced by self-report. Short-Term Objectives: Objective 1A: Sarah Shah will be able to rate her mood and anxiety  Objective 1B: Sarah Shah will  be able to identify any negative thinking patterns that are perpetuating anxiety or depressive symptoms and replace these with more adaptive thoughts.  Objective 1C: Sarah Shah will be able to use appropriate coping strategies including helpful thoughts to maintain a low level of anxiety and depressive symptoms. Interventions: Cognitive Behavioral Therapy, Systems analyst, Motivational Interviewing, and Psycho-education/Bibliotherapy  coping skills and other evidenced-based practices will be used to promote progress towards healthy functioning and to help manage decrease symptoms associated with their diagnosis.  Treatment Regimen: Individual skill building sessions every 2-4 weeks to address treatment goal/objective Target Date: 04/2022 Responsible Party: therapist and patient Person delivering treatment: Licensed Psychologist Sarah Shah, Sarah Shah will support the patient's ability to achieve the goals identified. Resolved:  No  Problem/Need: Life transition - Sarah Shah is in the process of completing a major life transition including graduating from high school, starting college, and potentially working. Long-Term Goal #2: Sarah Shah will successfully navigate these life transitions including being able to manage her stress level. Short-Term Objectives: Objective 2A: Sarah Shah will be able to identify stressors associated with the life transitions  Objective 2B: Sarah Shah will be able to identify strategies to help navigate stressors associated with the life transitions and implement them  Interventions: Assertiveness/Communication, Systems analyst, and Psycho-education/Bibliotherapy , and other evidenced-based practices will be used to promote progress towards healthy functioning and to help manage decrease symptoms associated with their diagnosis.  Treatment Regimen: Individual skill building sessions every 2-4 weeks to address treatment goal/objective Target Date: 04/2022 Responsible Party: therapist and  patient Person delivering treatment: Licensed Psychologist Sarah Shah, Sarah Shah will support the patient's ability to achieve the goals identified. Resolved:  No  Problem/Need: Life skills - Yomira needs to develop some additional skills to facilitate independent living. Long-Term Goal #3: Ameela will identify the additional life skills necessary to facilitate independent living. Short-Term Objectives: Objective 3A: Sarah Shah will be able to identify areas that she needs to address to increase the likelihood of her being able to successfully live independently.   Objective 3B: Sarah Shah will be able to create a plan to address weaknesses described in objective 3A.   Objective 3C: Sarah Shah will be able to implement steps on the plan developed in objective 3B. Interventions: Motivational Interviewing, Solution-Oriented/Positive Psychology, and Psycho-education/Bibliotherapy  daily life skills, and other evidenced-based practices will be used to promote progress towards healthy functioning and to help manage decrease symptoms associated with their diagnosis.  Treatment Regimen: Individual skill building sessions every 2-4 weeks to address treatment goal/objective Target Date: 04/2022 Responsible Party: therapist and patient Person delivering treatment: Licensed Psychologist Sarah Shah, Sarah Shah will support the patient's ability to achieve the goals identified. Resolved:  No Sarah Shah, Sarah Shah

## 2022-03-05 ENCOUNTER — Other Ambulatory Visit: Payer: Self-pay

## 2022-03-05 ENCOUNTER — Ambulatory Visit (INDEPENDENT_AMBULATORY_CARE_PROVIDER_SITE_OTHER): Payer: Medicaid Other | Admitting: Pediatric Endocrinology

## 2022-03-05 ENCOUNTER — Encounter: Payer: Self-pay | Admitting: Family

## 2022-03-05 ENCOUNTER — Encounter (INDEPENDENT_AMBULATORY_CARE_PROVIDER_SITE_OTHER): Payer: Self-pay | Admitting: Pediatric Endocrinology

## 2022-03-05 ENCOUNTER — Emergency Department

## 2022-03-05 ENCOUNTER — Emergency Department
Admission: EM | Admit: 2022-03-05 | Discharge: 2022-03-06 | Disposition: A | Discharge status: Home / Self Care | Attending: Student in an Organized Health Care Education/Training Program | Admitting: Student in an Organized Health Care Education/Training Program

## 2022-03-05 VITALS — BP 126/70 | HR 100 | Wt 309.4 lb

## 2022-03-05 DIAGNOSIS — E8881 Metabolic syndrome: Secondary | ICD-10-CM | POA: Diagnosis not present

## 2022-03-05 DIAGNOSIS — E88819 Insulin resistance, unspecified: Secondary | ICD-10-CM

## 2022-03-05 DIAGNOSIS — R110 Nausea: Secondary | ICD-10-CM | POA: Insufficient documentation

## 2022-03-05 DIAGNOSIS — K8050 Calculus of bile duct without cholangitis or cholecystitis without obstruction: Secondary | ICD-10-CM

## 2022-03-05 DIAGNOSIS — E282 Polycystic ovarian syndrome: Secondary | ICD-10-CM

## 2022-03-05 DIAGNOSIS — R1011 Right upper quadrant pain: Secondary | ICD-10-CM | POA: Diagnosis not present

## 2022-03-05 DIAGNOSIS — E876 Hypokalemia: Secondary | ICD-10-CM | POA: Insufficient documentation

## 2022-03-05 DIAGNOSIS — F840 Autistic disorder: Secondary | ICD-10-CM

## 2022-03-05 DIAGNOSIS — Z6841 Body Mass Index (BMI) 40.0 and over, adult: Secondary | ICD-10-CM

## 2022-03-05 DIAGNOSIS — K5903 Drug induced constipation: Secondary | ICD-10-CM

## 2022-03-05 LAB — CBC
HCT: 44.8 % (ref 36.0–46.0)
Hemoglobin: 15.4 g/dL — ABNORMAL HIGH (ref 12.0–15.0)
MCH: 29.4 pg (ref 26.0–34.0)
MCHC: 34.4 g/dL (ref 30.0–36.0)
MCV: 85.7 fL (ref 80.0–100.0)
Platelets: 301 10*3/uL (ref 150–400)
RBC: 5.23 MIL/uL — ABNORMAL HIGH (ref 3.87–5.11)
RDW: 11.8 % (ref 11.5–15.5)
WBC: 7.6 10*3/uL (ref 4.0–10.5)
nRBC: 0 % (ref 0.0–0.2)

## 2022-03-05 LAB — COMPREHENSIVE METABOLIC PANEL
ALT: 35 U/L (ref 0–44)
AST: 24 U/L (ref 15–41)
Albumin: 3.8 g/dL (ref 3.5–5.0)
Alkaline Phosphatase: 91 U/L (ref 38–126)
Anion gap: 12 (ref 5–15)
BUN: 7 mg/dL (ref 6–20)
CO2: 18 mmol/L — ABNORMAL LOW (ref 22–32)
Calcium: 9.4 mg/dL (ref 8.9–10.3)
Chloride: 105 mmol/L (ref 98–111)
Creatinine, Ser: 0.6 mg/dL (ref 0.44–1.00)
GFR, Estimated: >60 mL/min (ref >60)
Glucose, Bld: 81 mg/dL (ref 70–99)
Potassium: 3.4 mmol/L — ABNORMAL LOW (ref 3.5–5.1)
Sodium: 135 mmol/L (ref 135–145)
Total Bilirubin: 0.5 mg/dL (ref 0.3–1.2)
Total Protein: 7.3 g/dL (ref 6.5–8.1)

## 2022-03-05 LAB — URINALYSIS, ROUTINE W REFLEX MICROSCOPIC
Bilirubin Urine: NEGATIVE
Glucose, UA: NEGATIVE mg/dL
Hgb urine dipstick: NEGATIVE
Ketones, ur: NEGATIVE mg/dL
Nitrite: NEGATIVE
Protein, ur: NEGATIVE mg/dL
Specific Gravity, Urine: 1.002 — ABNORMAL LOW (ref 1.005–1.030)
pH: 6 (ref 5.0–8.0)

## 2022-03-05 LAB — POC URINE PREG, ED: Preg Test, Ur: NEGATIVE

## 2022-03-05 LAB — POCT GLYCOSYLATED HEMOGLOBIN (HGB A1C): Hemoglobin A1C: 5 % (ref 4.0–5.6)

## 2022-03-05 LAB — POCT GLUCOSE (DEVICE FOR HOME USE): POC Glucose: 117 mg/dl — AB (ref 70–99)

## 2022-03-05 LAB — LIPASE, BLOOD: Lipase: 36 U/L (ref 11–51)

## 2022-03-05 MED ORDER — ACETAMINOPHEN 325 MG PO TABS
650.0000 mg | ORAL_TABLET | Freq: Once | ORAL | Status: AC
  Administered 2022-03-05: 650 mg via ORAL
  Filled 2022-03-05: qty 2

## 2022-03-05 MED ORDER — POLYETHYLENE GLYCOL 3350 17 GM/SCOOP PO POWD: 1.0000 | Freq: Once | ORAL | 0 refills | Status: AC
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
## 2022-03-05 NOTE — ED Provider Notes (Signed)
[facility] Provider Note    Event Date/Time   First MD Initiated Contact with Patient 03/05/22 2313     (approximate)   History   Abdominal Pain   HPI  Sarah Shah is a 22 y.o. female history of PCOS presents to the ER for evaluation of few days of progressively worsening right upper quadrant pain worse with eating.  Pain does not have any significant migration.  No shortness of breath.  No chest pain is never had pain this before.  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  Is had some nausea but denying any nausea or significant pain right now.  Just requesting Tylenol.     Physical Exam   Triage Vital Signs: ED Triage Vitals  Enc Vitals Group     BP 03/05/22 2035 (!) 121/96     Pulse Rate 03/05/22 2035 84     Resp 03/05/22 2035 18     Temp 03/05/22 2035 (!) 97.5 F (36.4 C)     Temp Source 03/05/22 2035 Oral     SpO2 03/05/22 2035 99 %     Weight 03/05/22 2036 (!) 309 lb (140.2 kg)     Height 03/05/22 2036 5' 5"$  (1.651 m)     Head Circumference --      Peak Flow --      Pain Score 03/05/22 2036 10     Pain Loc --      Pain Edu? --      Excl. in [place]? --     Most recent vital signs: Vitals:   03/05/22 2035  BP: (!) 121/96  Pulse: 84  Resp: 18  Temp: (!) 97.5 F (36.4 C)  SpO2: 99%     Constitutional: Alert  Eyes: Conjunctivae are normal.  Head: Atraumatic. Nose: No congestion/rhinnorhea. Mouth/Throat: Mucous membranes are moist.   Neck: Painless ROM.  Cardiovascular:   Good peripheral circulation. Respiratory: Normal respiratory effort.  No retractions.  Gastrointestinal: Soft with mild right upper quadrant tenderness to palpation. Musculoskeletal:  no deformity Neurologic:  MAE spontaneously. No gross focal neurologic deficits are appreciated.  Skin:  Skin is warm, dry and intact. No rash noted. Psychiatric: Mood and affect are normal. Speech and behavior are normal.    ED  Results / Procedures / Treatments   Labs (all labs ordered are listed, but only abnormal results are displayed) Labs Reviewed  CBC - Abnormal; Notable for the following components:      Result Value   RBC 5.23 (*)    Hemoglobin 15.4 (*)    All other components within normal limits  URINALYSIS, ROUTINE W REFLEX MICROSCOPIC - Abnormal; Notable for the following components:   Color, Urine COLORLESS (*)    APPearance CLEAR (*)    Specific Gravity, Urine 1.002 (*)    Leukocytes,Ua TRACE (*)    Bacteria, UA RARE (*)    All other components within normal limits  COMPREHENSIVE METABOLIC PANEL - Abnormal; Notable for the following components:   Potassium 3.4 (*)    CO2 18 (*)    All other components within normal limits  LIPASE, BLOOD  POC URINE PREG, ED     EKG     RADIOLOGY Please see ED Course for my review and interpretation.  I personally reviewed all radiographic images ordered to evaluate for the above acute complaints and reviewed radiology reports and findings.  These findings were personally discussed with the patient.  Please  see medical record for radiology report.    PROCEDURES:  Critical Care performed: No  Procedures   MEDICATIONS ORDERED IN ED: Medications  acetaminophen (TYLENOL) tablet 650 mg (650 mg Oral Given 03/05/22 2319)     IMPRESSION / MDM / ASSESSMENT AND PLAN / ED COURSE  I reviewed the triage vital signs and the nursing notes.                              Differential diagnosis includes, but is not limited to, biliary pathology, cholelithiasis, cholecystitis, pancreatitis, hepatitis, enteritis, gastritis,  Patient presenting to the ER for evaluation of symptoms as described above.  Based on symptoms, risk factors and considered above differential, this presenting complaint could reflect a potentially life-threatening illness therefore the patient will be placed on continuous pulse oximetry and telemetry for monitoring.  Laboratory evaluation  will be sent to evaluate for the above complaints.  Patient does have some right upper quadrant tenderness to palpation.  No leukocytosis no fever.  No respiratory symptoms.  Not consistent with PE or pneumonia.  Ultrasound will be ordered to evaluate for biliary pathology.  Patient be signed out to oncoming physician pending follow-up ultrasound.     FINAL CLINICAL IMPRESSION(S) / ED DIAGNOSES   Final diagnoses:  RUQ pain     Rx / DC Orders   ED Discharge Orders     None        Note:  This document was prepared using Dragon voice recognition software and may include unintentional dictation errors.    Merlyn Lot, MD 03/05/22 [phone]

## 2022-03-05 NOTE — Patient Instructions (Signed)
Consider doing a clean out this weekend.  Please have labs drawn today or tomorrow.  Schedule follow up with GI for abdominal pain.

## 2022-03-05 NOTE — ED Triage Notes (Signed)
Pt to ED via POV c/o R sided abd pain. Pain started a few days ago. States she saw endocrinologist today and said it might be gallbladder. Pt with nausea, denies vomiting and diarrhea. No fevers at home, denies CP, SOB

## 2022-03-05 NOTE — ED Provider Notes (Signed)
-----------------------------------------   11:13 PM on 03/05/2022 -----------------------------------------  Blood pressure (!) 121/96, pulse 84, temperature (!) 97.5 F (36.4 C), temperature source Oral, resp. rate 18, height 5' 5"$  (1.651 m), weight (!) 140.2 kg, last menstrual period 02/19/2022, SpO2 99 %.  Assuming care from Dr. Quentin Cornwall.  In short, Sarah Shah is a 22 y.o. female with a chief complaint of Abdominal Pain .  Refer to the original H&P for additional details.  The current plan of care is to follow-up RUQ [place] and additional labs.  ----------------------------------------- 12:29 AM on 03/06/2022 ----------------------------------------- Labs markable for mild hypokalemia, otherwise reassuring with no AKI, anemia, or leukocytosis.  LFTs and lipase are unremarkable, right upper quadrant ultrasound does show cholelithiasis but no evidence of cholecystitis.  On reassessment, patient has no pain whatsoever and she is appropriate for discharge home with outpatient general surgery follow-up.  She was counseled to return to the ED for new or worsening symptoms, patient agrees with plan.    Blake Divine, MD 03/06/22 [phone]

## 2022-03-06 ENCOUNTER — Encounter (INDEPENDENT_AMBULATORY_CARE_PROVIDER_SITE_OTHER): Payer: Self-pay | Admitting: Pediatric Endocrinology

## 2022-03-06 DIAGNOSIS — R1011 Right upper quadrant pain: Secondary | ICD-10-CM | POA: Diagnosis not present

## 2022-03-06 MED ORDER — POTASSIUM CHLORIDE CRYS ER 20 MEQ PO TBCR
20.0000 meq | EXTENDED_RELEASE_TABLET | Freq: Once | ORAL | Status: AC
  Administered 2022-03-06: 20 meq via ORAL
  Filled 2022-03-06: qty 1

## 2022-03-06 MED ORDER — ONDANSETRON 4 MG PO TBDP: 4.0000 mg | ORAL_TABLET | Freq: Three times a day (TID) | ORAL | 0 refills | Status: DC | PRN

## 2022-03-06 NOTE — Telephone Encounter (Signed)
Here's an update on patient.

## 2022-03-09 ENCOUNTER — Telehealth: Payer: Self-pay | Admitting: *Deleted

## 2022-03-09 DIAGNOSIS — K8010 Calculus of gallbladder with chronic cholecystitis without obstruction: Secondary | ICD-10-CM | POA: Insufficient documentation

## 2022-03-09 NOTE — Transitions of Care (Post Inpatient/ED Visit) (Signed)
   03/09/2022  Name: Drishya Wetmore MRN: [id number] DOB: [birth year]  Today's TOC FU Call Status: Today's TOC FU Call Status:: Unsuccessul Call (1st Attempt) Unsuccessful Call (1st Attempt) Date: 03/09/22  Attempted to reach the patient regarding the most recent Inpatient/ED visit.  Follow Up Plan: Additional outreach attempts will be made to reach the patient to complete the Transitions of Care (Post Inpatient/ED visit) call.   Lurena Joiner RN, BSN [place]  [employer] manager

## 2022-03-09 NOTE — Progress Notes (Unsigned)
Patient ID: Sarah Shah, female   DOB: 01-20-2000, 22 y.o.   MRN: [id number]  Chief Complaint: Right upper quadrant pain  History of Present Illness Sarah Shah is a 22 y.o. female with right upper quadrant pain felt to be secondary to cholelithiasis.  Radiates to the back, different from prior mesenteric adenitis pain.  History of prior evaluation with LFT changes, at that time ultrasound was negative for stones.  LFT abnormal teas were resolved.  Most recent ultrasound done in ED is consistent with stones, normal caliber, bile duct.  Difficult to confirm sources of diet that is provoking the pain.  She presents today with her mother who is most helpful historian.  Past Medical History Past Medical History:  Diagnosis Date   ADHD (attention deficit hyperactivity disorder)    Anxiety    Autism spectrum    Constipated    Constipation    Depression    Development delay    GERD (gastroesophageal reflux disease)    Insulin resistance    Iron deficiency    Learning disability    Obesity    ODD (oppositional defiant disorder)    PCOS (polycystic ovarian syndrome)    PMDD (premenstrual dysphoric disorder)    Pneumonia    3 mos old and 80 mos old   Visual acuity reduced    glasses      Past Surgical History:  Procedure Laterality Date   BIOPSY  02/17/2021   Procedure: BIOPSY;  Surgeon: Irene Shipper, MD;  Location: [facility];  Service: Endoscopy;;   COLONOSCOPY WITH PROPOFOL N/A 02/17/2021   Procedure: COLONOSCOPY WITH PROPOFOL;  Surgeon: Irene Shipper, MD;  Location: [facility];  Service: Endoscopy;  Laterality: N/A;   ESOPHAGOGASTRODUODENOSCOPY (EGD) WITH PROPOFOL N/A 02/17/2021   Procedure: ESOPHAGOGASTRODUODENOSCOPY (EGD) WITH PROPOFOL;  Surgeon: Irene Shipper, MD;  Location: [facility];  Service: Endoscopy;  Laterality: N/A;   TOOTH EXTRACTION N/A 01/03/2018   Procedure: SURGICAL EXTRACTION OF TEETH #1, 16, 17, 32;  Surgeon: Michael Litter, DMD;  Location: [place];   Service: Oral Surgery;  Laterality: N/A;    No Known Allergies  Current Outpatient Medications  Medication Sig Dispense Refill   amphetamine-dextroamphetamine (ADDERALL XR) 30 MG 24 hr capsule Take 30 mg by mouth every morning.     busPIRone (BUSPAR) 10 MG tablet 20 mg in the morning, 20 mg at lunch, 10-20 mg at 4p as needed, and can take up to 60 mg total daily (Patient taking differently: 30 mg. 20 mg in the morning, 20 mg at lunch, 10-20 mg at 4p as needed, and can take up to 60 mg total daily) 540 tablet 1   cloNIDine HCl (KAPVAY) 0.1 MG TB12 ER tablet Take 2 tablets (0.2 mg total) by mouth 2 (two) times daily. Take 2 tablets by mouth 2 times daily (Patient taking differently: Take 0.2 mg by mouth 2 (two) times daily.) 360 tablet 1   desogestrel-ethinyl estradiol (VIORELE) 0.15-0.02/0.01 MG (21/5) tablet Take 1 tablet by mouth daily. Please give active pills only. Skip placebo pills. 140 tablet 3   dicyclomine (BENTYL) 20 MG tablet Take 1 tablet (20 mg total) by mouth every 6 (six) hours. (Patient taking differently: Take 20 mg by mouth every 6 (six) hours as needed (stomach pain).) 60 tablet 0   docusate sodium (COLACE) 100 MG capsule Take 100 mg by mouth at bedtime.     ferrous sulfate 325 (65 FE) MG tablet Take 325 mg by mouth at bedtime.  FLUoxetine (PROZAC) 40 MG capsule Take 1 capsule (40 mg total) by mouth daily.     omeprazole (PRILOSEC) 40 MG capsule Take 40 mg by mouth daily.     ondansetron (ZOFRAN-ODT) 4 MG disintegrating tablet Take 1 tablet (4 mg total) by mouth every 8 (eight) hours as needed for nausea or vomiting. 12 tablet 0   Topiramate ER (TROKENDI XR) 50 MG CP24 Take 1 capsule by mouth daily. 30 capsule 1   vitamin C (ASCORBIC ACID) 500 MG tablet Take 500 mg by mouth at bedtime.     Vitamin D, Ergocalciferol, (DRISDOL) 1.25 MG (50000 UNIT) CAPS capsule Take 50,000 Units by mouth once a week.     No current facility-administered medications for this visit.    Family  History Family History  Problem Relation Age of Onset   Diabetes Mother    Hypertension Mother    Anxiety disorder Mother    Other Mother        Premenstrual dysphoic disorder   Obesity Mother    Stroke Mother    Colon polyps Mother    Irritable bowel syndrome Mother    Diabetes Father        type 1   Depression Father    Anxiety disorder Father    Obesity Father    Anxiety disorder Sister    ADD / ADHD Sister        ADD   Other Sister        Premenstrual dysphoric disorder   Atrial fibrillation Maternal Grandmother    Diabetes Maternal Grandmother    Heart failure Maternal Grandmother    Hypertension Maternal Grandfather    Iron deficiency Maternal Aunt    Breast cancer Maternal Aunt       Social History Social History   Tobacco Use   Smoking status: Never   Smokeless tobacco: Never  Vaping Use   Vaping Use: Never used  Substance Use Topics   Alcohol use: No   Drug use: Never        Review of Systems  Constitutional:  Positive for malaise/fatigue.  HENT: Negative.    Eyes: Negative.   Respiratory: Negative.    Cardiovascular: Negative.   Gastrointestinal:  Positive for abdominal pain, constipation, diarrhea and nausea. Negative for blood in stool, heartburn, melena and vomiting.  Genitourinary: Negative.   Skin:  Positive for itching.  Neurological: Negative.   Psychiatric/Behavioral:  Positive for depression.      Physical Exam Blood pressure 106/74, pulse 96, temperature 98.3 F (36.8 C), temperature source Oral, height '5\' 5"'$  (1.651 m), weight (!) 308 lb 9.6 oz (140 kg), last menstrual period 02/19/2022, SpO2 99 %. Last Weight  Most recent update: 03/10/2022 10:46 AM    Weight  140 kg (308 lb 9.6 oz)               CONSTITUTIONAL: Well developed, and nourished, appropriately responsive and aware without distress.  Responsive to questioning, limited in detail. EYES: Sclera non-icteric.   EARS, NOSE, MOUTH AND THROAT:  The oropharynx is clear.  Oral mucosa is pink and moist.    Hearing is intact to voice.  NECK: Trachea is midline, and there is no jugular venous distension.  LYMPH NODES:  Lymph nodes in the neck are not appreciated. RESPIRATORY:  Lungs are clear, and breath sounds are equal bilaterally.  Normal respiratory effort without pathologic use of accessory muscles. CARDIOVASCULAR: Heart is regular in rate and rhythm.   Well perfused.  GI: The abdomen  is morbidly obese, without scars, soft, nontender, and nondistended. There were no palpable masses.  I did not appreciate hepatosplenomegaly.  MUSCULOSKELETAL:  Symmetrical muscle tone appreciated in all four extremities.    SKIN: Skin turgor is normal. No pathologic skin lesions appreciated.  NEUROLOGIC:  Motor and sensation appear grossly normal.  Cranial nerves are grossly without defect. PSYCH:  Alert and oriented to person, place and time. Affect is appropriate for situation.  Data Reviewed I have personally reviewed what is currently available of the patient's imaging, recent labs and medical records.   Labs:     Latest Ref Rng & Units 03/05/2022    8:39 PM 01/15/2022    2:27 PM 11/26/2021    4:12 PM  CBC  WBC 4.0 - 10.5 K/uL 7.6  7.5  8.4   Hemoglobin 12.0 - 15.0 g/dL 15.4  14.8  13.9   Hematocrit 36.0 - 46.0 % 44.8  42.7  39.7   Platelets 150 - 400 K/uL 301  314  301       Latest Ref Rng & Units 03/05/2022   10:54 PM 01/15/2022    2:27 PM 11/05/2021   10:51 AM  CMP  Glucose 70 - 99 mg/dL 81  77    BUN 6 - 20 mg/dL 7  9    Creatinine 0.44 - 1.00 mg/dL 0.60  0.65    Sodium 135 - 145 mmol/L 135  141    Potassium 3.5 - 5.1 mmol/L 3.4  3.5    Chloride 98 - 111 mmol/L 105  110    CO2 22 - 32 mmol/L 18  23    Calcium 8.9 - 10.3 mg/dL 9.4  9.9    Total Protein 6.5 - 8.1 g/dL 7.3  7.2  7.4   Total Bilirubin 0.3 - 1.2 mg/dL 0.5  0.2  0.4   Alkaline Phos 38 - 126 U/L 91  100  126   AST 15 - 41 U/L '24  11  16   '$ ALT 0 - 44 U/L 35  10  68        Imaging: Radiological images reviewed:   EXAM: ULTRASOUND ABDOMEN LIMITED   COMPARISON:  10/29/2021   FINDINGS: The liver demonstrates normal parenchymal echogenicity and homogeneous texture without focal hepatic parenchymal lesions or intrahepatic ductal dilatation. Hepatopetal portal vein.   Gallbladder contains layering tiny shadowing stones. No wall thickening or pericholecystic fluid. CBD measured 0.3cm.   IMPRESSION: Cholelithiasis.     Electronically Signed   By: Sammie Bench M.D.   On: 03/06/2022 00:08 Within last 24 hrs: No results found.  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   Migraines 10/03/2021   Abnormal CT of the abdomen    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/2020   Iron deficiency 04/25/2020   Learning disability    GERD (gastroesophageal reflux disease) 07/20/2019   Vitamin D deficiency 07/18/2019   Severe episode of recurrent major depressive disorder, without psychotic features ([place]) 01/25/2019   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   PCOS (polycystic ovarian syndrome) 10/05/2016   Flat feet, bilateral 12/19/2015   Insulin resistance 12/03/2015   Class 3 severe obesity with serious comorbidity and body mass index (BMI) of 50.0 to 59.9 in adult ([place]) 08/27/2014   Oppositional defiant disorder    Autism spectrum disorder    DEVELOPMENTAL DELAY 11/29/2006   Developmental delay 11/29/2006   Attention deficit hyperactivity disorder (ADHD), combined type 05/13/2006  Plan    Robotic cholecystectomy with ICG imaging.  This was discussed thoroughly.  Optimal plan is for robotic cholecystectomy utilizing ICG imaging. Risks and benefits have been discussed with the patient which include but are not limited to anesthesia, bleeding, infection, biliary ductal injury, resulting in leak or stenosis, other associated unanticipated injuries affiliated with laparoscopic  surgery.   Reviewed that removing the gallbladder will only address the symptoms related to the gallbladder itself.  I believe there is the desire to proceed, accepting the risks with understanding.  Questions elicited and answered to satisfaction.    No guarantees ever expressed or implied.   Face-to-face time spent with the patient and accompanying care providers(if present) was 40 minutes, with more than 50% of the time spent counseling, educating, and coordinating care of the patient.    These notes generated with voice recognition software. I apologize for typographical errors.  Ronny Bacon M.D., FACS 03/10/2022, 11:54 AM

## 2022-03-09 NOTE — H&P (View-Only) (Signed)
Patient ID: Sarah Shah, female   DOB: [birth year], 22 y.o.   MRN: [id number]  Chief Complaint: Right upper quadrant pain  History of Present Illness Sarah Shah is a 22 y.o. female with right upper quadrant pain felt to be secondary to cholelithiasis.  Radiates to the back, different from prior mesenteric adenitis pain.  History of prior evaluation with LFT changes, at that time ultrasound was negative for stones.  LFT abnormal teas were resolved.  Most recent ultrasound done in ED is consistent with stones, normal caliber, bile duct.  Difficult to confirm sources of diet that is provoking the pain.  She presents today with her mother who is most helpful historian.  Past Medical History Past Medical History:  Diagnosis Date   ADHD (attention deficit hyperactivity disorder)    Anxiety    Autism spectrum    Constipated    Constipation    Depression    Development delay    GERD (gastroesophageal reflux disease)    Insulin resistance    Iron deficiency    Learning disability    Obesity    ODD (oppositional defiant disorder)    PCOS (polycystic ovarian syndrome)    PMDD (premenstrual dysphoric disorder)    Pneumonia    3 mos old and 39 mos old   Visual acuity reduced    glasses      Past Surgical History:  Procedure Laterality Date   BIOPSY  02/17/2021   Procedure: BIOPSY;  Surgeon: Irene Shipper, MD;  Location: [facility];  Service: Endoscopy;;   COLONOSCOPY WITH PROPOFOL N/A 02/17/2021   Procedure: COLONOSCOPY WITH PROPOFOL;  Surgeon: Irene Shipper, MD;  Location: [facility];  Service: Endoscopy;  Laterality: N/A;   ESOPHAGOGASTRODUODENOSCOPY (EGD) WITH PROPOFOL N/A 02/17/2021   Procedure: ESOPHAGOGASTRODUODENOSCOPY (EGD) WITH PROPOFOL;  Surgeon: Irene Shipper, MD;  Location: [facility];  Service: Endoscopy;  Laterality: N/A;   TOOTH EXTRACTION N/A 01/03/2018   Procedure: SURGICAL EXTRACTION OF TEETH #1, 16, 17, 32;  Surgeon: Michael Litter, DMD;  Location: [place];   Service: Oral Surgery;  Laterality: N/A;    No Known Allergies  Current Outpatient Medications  Medication Sig Dispense Refill   amphetamine-dextroamphetamine (ADDERALL XR) 30 MG 24 hr capsule Take 30 mg by mouth every morning.     busPIRone (BUSPAR) 10 MG tablet 20 mg in the morning, 20 mg at lunch, 10-20 mg at 4p as needed, and can take up to 60 mg total daily (Patient taking differently: 30 mg. 20 mg in the morning, 20 mg at lunch, 10-20 mg at 4p as needed, and can take up to 60 mg total daily) 540 tablet 1   cloNIDine HCl (KAPVAY) 0.1 MG TB12 ER tablet Take 2 tablets (0.2 mg total) by mouth 2 (two) times daily. Take 2 tablets by mouth 2 times daily (Patient taking differently: Take 0.2 mg by mouth 2 (two) times daily.) 360 tablet 1   desogestrel-ethinyl estradiol (VIORELE) 0.15-0.02/0.01 MG (21/5) tablet Take 1 tablet by mouth daily. Please give active pills only. Skip placebo pills. 140 tablet 3   dicyclomine (BENTYL) 20 MG tablet Take 1 tablet (20 mg total) by mouth every 6 (six) hours. (Patient taking differently: Take 20 mg by mouth every 6 (six) hours as needed (stomach pain).) 60 tablet 0   docusate sodium (COLACE) 100 MG capsule Take 100 mg by mouth at bedtime.     ferrous sulfate 325 (65 FE) MG tablet Take 325 mg by mouth at bedtime.  FLUoxetine (PROZAC) 40 MG capsule Take 1 capsule (40 mg total) by mouth daily.     omeprazole (PRILOSEC) 40 MG capsule Take 40 mg by mouth daily.     ondansetron (ZOFRAN-ODT) 4 MG disintegrating tablet Take 1 tablet (4 mg total) by mouth every 8 (eight) hours as needed for nausea or vomiting. 12 tablet 0   Topiramate ER (TROKENDI XR) 50 MG CP24 Take 1 capsule by mouth daily. 30 capsule 1   vitamin C (ASCORBIC ACID) 500 MG tablet Take 500 mg by mouth at bedtime.     Vitamin D, Ergocalciferol, (DRISDOL) 1.25 MG (50000 UNIT) CAPS capsule Take 50,000 Units by mouth once a week.     No current facility-administered medications for this visit.    Family  History Family History  Problem Relation Age of Onset   Diabetes Mother    Hypertension Mother    Anxiety disorder Mother    Other Mother        Premenstrual dysphoic disorder   Obesity Mother    Stroke Mother    Colon polyps Mother    Irritable bowel syndrome Mother    Diabetes Father        type 1   Depression Father    Anxiety disorder Father    Obesity Father    Anxiety disorder Sister    ADD / ADHD Sister        ADD   Other Sister        Premenstrual dysphoric disorder   Atrial fibrillation Maternal Grandmother    Diabetes Maternal Grandmother    Heart failure Maternal Grandmother    Hypertension Maternal Grandfather    Iron deficiency Maternal Aunt    Breast cancer Maternal Aunt       Social History Social History   Tobacco Use   Smoking status: Never   Smokeless tobacco: Never  Vaping Use   Vaping Use: Never used  Substance Use Topics   Alcohol use: No   Drug use: Never        Review of Systems  Constitutional:  Positive for malaise/fatigue.  HENT: Negative.    Eyes: Negative.   Respiratory: Negative.    Cardiovascular: Negative.   Gastrointestinal:  Positive for abdominal pain, constipation, diarrhea and nausea. Negative for blood in stool, heartburn, melena and vomiting.  Genitourinary: Negative.   Skin:  Positive for itching.  Neurological: Negative.   Psychiatric/Behavioral:  Positive for depression.      Physical Exam Blood pressure 106/74, pulse 96, temperature 98.3 F (36.8 C), temperature source Oral, height '5\' 5"'$  (1.651 m), weight (!) 308 lb 9.6 oz (140 kg), last menstrual period 02/19/2022, SpO2 99 %. Last Weight  Most recent update: 03/10/2022 10:46 AM    Weight  140 kg (308 lb 9.6 oz)               CONSTITUTIONAL: Well developed, and nourished, appropriately responsive and aware without distress.  Responsive to questioning, limited in detail. EYES: Sclera non-icteric.   EARS, NOSE, MOUTH AND THROAT:  The oropharynx is clear.  Oral mucosa is pink and moist.    Hearing is intact to voice.  NECK: Trachea is midline, and there is no jugular venous distension.  LYMPH NODES:  Lymph nodes in the neck are not appreciated. RESPIRATORY:  Lungs are clear, and breath sounds are equal bilaterally.  Normal respiratory effort without pathologic use of accessory muscles. CARDIOVASCULAR: Heart is regular in rate and rhythm.   Well perfused.  GI: The abdomen  is morbidly obese, without scars, soft, nontender, and nondistended. There were no palpable masses.  I did not appreciate hepatosplenomegaly.  MUSCULOSKELETAL:  Symmetrical muscle tone appreciated in all four extremities.    SKIN: Skin turgor is normal. No pathologic skin lesions appreciated.  NEUROLOGIC:  Motor and sensation appear grossly normal.  Cranial nerves are grossly without defect. PSYCH:  Alert and oriented to person, place and time. Affect is appropriate for situation.  Data Reviewed I have personally reviewed what is currently available of the patient's imaging, recent labs and medical records.   Labs:     Latest Ref Rng & Units 03/05/2022    8:39 PM 01/15/2022    2:27 PM 11/26/2021    4:12 PM  CBC  WBC 4.0 - 10.5 K/uL 7.6  7.5  8.4   Hemoglobin 12.0 - 15.0 g/dL 15.4  14.8  13.9   Hematocrit 36.0 - 46.0 % 44.8  42.7  39.7   Platelets 150 - 400 K/uL 301  314  301       Latest Ref Rng & Units 03/05/2022   10:54 PM 01/15/2022    2:27 PM 11/05/2021   10:51 AM  CMP  Glucose 70 - 99 mg/dL 81  77    BUN 6 - 20 mg/dL 7  9    Creatinine 0.44 - 1.00 mg/dL 0.60  0.65    Sodium 135 - 145 mmol/L 135  141    Potassium 3.5 - 5.1 mmol/L 3.4  3.5    Chloride 98 - 111 mmol/L 105  110    CO2 22 - 32 mmol/L 18  23    Calcium 8.9 - 10.3 mg/dL 9.4  9.9    Total Protein 6.5 - 8.1 g/dL 7.3  7.2  7.4   Total Bilirubin 0.3 - 1.2 mg/dL 0.5  0.2  0.4   Alkaline Phos 38 - 126 U/L 91  100  126   AST 15 - 41 U/L '24  11  16   '$ ALT 0 - 44 U/L 35  10  68        Imaging: Radiological images reviewed:   EXAM: ULTRASOUND ABDOMEN LIMITED   COMPARISON:  10/29/2021   FINDINGS: The liver demonstrates normal parenchymal echogenicity and homogeneous texture without focal hepatic parenchymal lesions or intrahepatic ductal dilatation. Hepatopetal portal vein.   Gallbladder contains layering tiny shadowing stones. No wall thickening or pericholecystic fluid. CBD measured 0.3cm.   IMPRESSION: Cholelithiasis.     Electronically Signed   By: Sammie Bench M.D.   On: 03/06/2022 00:08 Within last 24 hrs: No results found.  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   Migraines 10/03/2021   Abnormal CT of the abdomen    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/2020   Iron deficiency 04/25/2020   Learning disability    GERD (gastroesophageal reflux disease) 07/20/2019   Vitamin D deficiency 07/18/2019   Severe episode of recurrent major depressive disorder, without psychotic features ([place]) 01/25/2019   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   PCOS (polycystic ovarian syndrome) 10/05/2016   Flat feet, bilateral 12/19/2015   Insulin resistance 12/03/2015   Class 3 severe obesity with serious comorbidity and body mass index (BMI) of 50.0 to 59.9 in adult ([place]) 08/27/2014   Oppositional defiant disorder    Autism spectrum disorder    DEVELOPMENTAL DELAY 11/29/2006   Developmental delay 11/29/2006   Attention deficit hyperactivity disorder (ADHD), combined type 05/13/2006  Plan    Robotic cholecystectomy with ICG imaging.  This was discussed thoroughly.  Optimal plan is for robotic cholecystectomy utilizing ICG imaging. Risks and benefits have been discussed with the patient which include but are not limited to anesthesia, bleeding, infection, biliary ductal injury, resulting in leak or stenosis, other associated unanticipated injuries affiliated with laparoscopic  surgery.   Reviewed that removing the gallbladder will only address the symptoms related to the gallbladder itself.  I believe there is the desire to proceed, accepting the risks with understanding.  Questions elicited and answered to satisfaction.    No guarantees ever expressed or implied.   Face-to-face time spent with the patient and accompanying care providers(if present) was 40 minutes, with more than 50% of the time spent counseling, educating, and coordinating care of the patient.    These notes generated with voice recognition software. I apologize for typographical errors.  Ronny Bacon M.D., FACS 03/10/2022, 11:54 AM

## 2022-03-10 ENCOUNTER — Encounter: Payer: Self-pay | Admitting: Surgery

## 2022-03-10 ENCOUNTER — Ambulatory Visit: Payer: Self-pay | Admitting: Surgery

## 2022-03-10 ENCOUNTER — Ambulatory Visit (INDEPENDENT_AMBULATORY_CARE_PROVIDER_SITE_OTHER): Admitting: Surgery

## 2022-03-10 ENCOUNTER — Telehealth: Payer: Self-pay | Admitting: Surgery

## 2022-03-10 VITALS — BP 106/74 | HR 96 | Temp 98.3°F | Ht 65.0 in | Wt 308.6 lb

## 2022-03-10 DIAGNOSIS — K8010 Calculus of gallbladder with chronic cholecystitis without obstruction: Secondary | ICD-10-CM | POA: Diagnosis not present

## 2022-03-10 DIAGNOSIS — Z6841 Body Mass Index (BMI) 40.0 and over, adult: Secondary | ICD-10-CM | POA: Diagnosis not present

## 2022-03-10 NOTE — Telephone Encounter (Signed)
Spoke with mom and patient, they both have been informed of the following regarding scheduled surgery with Dr. Christian Mate.    Pre-Admission date/time, and Surgery date at [facility].  Surgery Date: 03/16/22 Preadmission Testing Date: 03/13/22 (phone 8a-1p)  Patient has been made aware to call [phone], between 1-3:00pm the day before surgery, to find out what time to arrive for surgery.

## 2022-03-10 NOTE — Patient Instructions (Signed)
Our surgery scheduler Pamala Hurry will call you within 24-48 hours to get you scheduled. If you have not heard from her after 48 hours, please call our office. Have the blue sheet available when she calls to write down important information.   If you have any concerns or questions, please feel free to call our office.   Minimally Invasive Cholecystectomy Minimally invasive cholecystectomy is surgery to remove the gallbladder. The gallbladder is a pear-shaped organ that lies beneath the liver on the right side of the body. The gallbladder stores bile, which is a fluid that helps the body digest fats. Cholecystectomy is often done to treat inflammation (irritation and swelling) of the gallbladder (cholecystitis). This condition is usually caused by a buildup of gallstones (cholelithiasis) in the gallbladder or when the fluid in the gall bladder becomes stagnant because gallstones get stuck in the ducts (tubes) and block the flow of bile. This can result in inflammation and pain. In severe cases, emergency surgery may be required. This procedure is done through small incisions in the abdomen, instead of one large incision. It is also called laparoscopic surgery. A thin scope with a camera (laparoscope) is inserted through one incision. Then surgical instruments are inserted through the other incisions. In some cases, a minimally invasive surgery may need to be changed to a surgery that is done through a larger incision. This is called open surgery. Tell a health care provider about: Any allergies you have. All medicines you are taking, including vitamins, herbs, eye drops, creams, and over-the-counter medicines. Any problems you or family members have had with anesthetic medicines. Any bleeding problems you have. Any surgeries you have had. Any medical conditions you have. Whether you are pregnant or may be pregnant. What are the risks? Generally, this is a safe procedure. However, problems may occur,  including: Infection. Bleeding. Allergic reactions to medicines. Damage to nearby structures or organs. A gallstone remaining in the common bile duct. The common bile duct carries bile from the gallbladder to the small intestine. A bile leak from the liver or cystic duct after your gallbladder is removed. What happens before the procedure? When to stop eating and drinking Follow instructions from your health care provider about what you may eat and drink before your procedure. These may include: 8 hours before the procedure Stop eating most foods. Do not eat meat, fried foods, or fatty foods. Eat only light foods, such as toast or crackers. All liquids are okay except energy drinks and alcohol. 6 hours before the procedure Stop eating. Drink only clear liquids, such as water, clear fruit juice, black coffee, plain tea, and sports drinks. Do not drink energy drinks or alcohol. 2 hours before the procedure Stop drinking all liquids. You may be allowed to take medicines with small sips of water. If you do not follow your health care provider's instructions, your procedure may be delayed or canceled. Medicines Ask your health care provider about: Changing or stopping your regular medicines. This is especially important if you are taking diabetes medicines or blood thinners. Taking medicines such as aspirin and ibuprofen. These medicines can thin your blood. Do not take these medicines unless your health care provider tells you to take them. Taking over-the-counter medicines, vitamins, herbs, and supplements. General instructions If you will be going home right after the procedure, plan to have a responsible adult: Take you home from the hospital or clinic. You will not be allowed to drive. Care for you for the time you are told. Do  not use any products that contain nicotine or tobacco for at least 4 weeks before the procedure. These products include cigarettes, chewing tobacco, and vaping  devices, such as e-cigarettes. If you need help quitting, ask your health care provider. Ask your health care provider: How your surgery site will be marked. What steps will be taken to help prevent infection. These may include: Removing hair at the surgery site. Washing skin with a germ-killing soap. Taking antibiotic medicine. What happens during the procedure?  An IV will be inserted into one of your veins. You will be given one or both of the following: A medicine to help you relax (sedative). A medicine to make you fall asleep (general anesthetic). Your surgeon will make several small incisions in your abdomen. The laparoscope will be inserted through one of the small incisions. The camera on the laparoscope will send images to a monitor in the operating room. This lets your surgeon see inside your abdomen. A gas will be pumped into your abdomen. This will expand your abdomen to give the surgeon more room to perform the surgery. Other tools that are needed for the procedure will be inserted through the other incisions. The gallbladder will be removed through one of the incisions. Your common bile duct may be examined. If stones are found in the common bile duct, they may be removed. After your gallbladder has been removed, the incisions will be closed with stitches (sutures), staples, or skin glue. Your incisions will be covered with a bandage (dressing). The procedure may vary among health care providers and hospitals. What happens after the procedure? Your blood pressure, heart rate, breathing rate, and blood oxygen level will be monitored until you leave the hospital or clinic. You will be given medicines as needed to control your pain. You may have a drain placed in the incision. The drain will be removed a day or two after the procedure. Summary Minimally invasive cholecystectomy, also called laparoscopic cholecystectomy, is surgery to remove the gallbladder using small  incisions. Tell your health care provider about all the medical conditions you have and all the medicines you are taking for those conditions. Before the procedure, follow instructions about when to stop eating and drinking and changing or stopping medicines. Plan to have a responsible adult care for you for the time you are told after you leave the hospital or clinic. This information is not intended to replace advice given to you by your health care provider. Make sure you discuss any questions you have with your health care provider. Document Revised: 07/02/2020 Document Reviewed: 07/02/2020 Elsevier Patient Education  [place].   Gallbladder Eating Plan  High blood cholesterol, obesity, a sedentary lifestyle, an unhealthy diet, and diabetes are risk factors for developing gallstones. If you have a gallbladder condition, you may have trouble digesting fats and tolerating high fat intake. Eating a low-fat diet can help reduce your symptoms and may be helpful before and after having surgery to remove your gallbladder (cholecystectomy). Your health care provider may recommend that you work with a dietitian to help you reduce the amount of fat in your diet. What are tips for following this plan? General guidelines Limit your fat intake to less than 30% of your total daily calories. If you eat around 1,800 calories each day, this means eating less than 60 grams (g) of fat per day. Fat is an important part of a healthy diet. Eating a low-fat diet can make it hard to maintain a healthy body  weight. Ask your dietitian how much fat, calories, and other nutrients you need each day. Eat small, frequent meals throughout the day instead of three large meals. Drink at least 8-10 cups (1.9-2.4 L) of fluid a day. Drink enough fluid to keep your urine pale yellow. If you drink alcohol: Limit how much you have to: 0-1 drink a day for women who are not pregnant. 0-2 drinks a day for men. Know how  much alcohol is in a drink. In the [place] one drink equals one 12 oz bottle of beer (355 mL), one 5 oz glass of wine (148 mL), or one 1 oz glass of hard liquor (44 mL). Reading food labels  Check nutrition facts on food labels for the amount of fat per serving. Choose foods with less than 3 grams of fat per serving. Shopping Choose nonfat and low-fat healthy foods. Look for the words "nonfat," "low-fat," or "fat-free." Avoid buying processed or prepackaged foods. Cooking Cook using low-fat methods, such as baking, broiling, grilling, or boiling. Cook with small amounts of healthy fats, such as olive oil, grapeseed oil, canola oil, avocado oil, or sunflower oil. What foods are recommended?  All fresh, frozen, or canned fruits and vegetables. Whole grains. Low-fat or nonfat (skim) milk and yogurt. Lean meat, skinless poultry, fish, eggs, and beans. Low-fat protein supplement powders or drinks. Spices and herbs. The items listed above may not be a complete list of foods and beverages you can eat and drink. Contact a dietitian for more information. What foods are not recommended? High-fat foods. These include baked goods, fast food, fatty cuts of meat, ice cream, french toast, sweet rolls, pizza, cheese bread, foods covered with butter, creamy sauces, or cheese. Fried foods. These include french fries, tempura, battered fish, breaded chicken, fried breads, and sweets. Foods that cause bloating and gas. The items listed above may not be a complete list of foods that you should avoid. Contact a dietitian for more information. Summary A low-fat diet can be helpful if you have a gallbladder condition, or before and after gallbladder surgery. Limit your fat intake to less than 30% of your total daily calories. This is about 60 g of fat if you eat 1,800 calories each day. Eat small, frequent meals throughout the day instead of three large meals. This information is not intended to replace advice  given to you by your health care provider. Make sure you discuss any questions you have with your health care provider. Document Revised: 12/13/2020 Document Reviewed: 12/13/2020 Elsevier Patient Education  [place].

## 2022-03-12 ENCOUNTER — Ambulatory Visit (INDEPENDENT_AMBULATORY_CARE_PROVIDER_SITE_OTHER): Admitting: Clinical

## 2022-03-12 DIAGNOSIS — F419 Anxiety disorder, unspecified: Secondary | ICD-10-CM

## 2022-03-12 DIAGNOSIS — F840 Autistic disorder: Secondary | ICD-10-CM

## 2022-03-12 NOTE — Progress Notes (Signed)
[facility] Counselor/Therapist Progress Note  Patient ID: Sarah Shah, MRN: [id number]    Date: 03/12/22  Time Spent: 10:00 am - 10:42 am: 42 Minutes  Type of Service Provided Individual Therapy  Type of Contact virtual (via [place] with real time audio and visual interaction)  Patient Location: home       Provider Location: office  Sarah Shah participated from home, via video, and consented to treatment. Therapist participated from office.    Mental Status Exam: Appearance:  Neat and Well Groomed     Behavior: Appropriate and Sharing  Motor: Normal  Speech/Language:  Clear and Coherent  Affect: Appropriate  Mood: normal a bit anxious   Thought process: normal  Thought content:   WNL  Sensory/Perceptual disturbances:   Pain - on and off due to gallstones - surgery scheduled on Monday  Orientation: oriented to person, place, time/date, and situation  Attention: Good  Concentration: Good  Memory: WNL  Fund of knowledge:  Fair  Insight:   Fair  Judgment:  Fair  Impulse Control: Good   Risk Assessment: Danger to Self:  No - denied SI Self-injurious Behavior: No - denied SIB Danger to Others: No Duty to Warn:no  Presenting Problems, Reported Symptoms, and /or Interim History: Sarah Shah presented for a session to address anxiety and life stress.   Of note, Sarah Shah's mother was present for the beginning of the visit but only participated for a few minutes. Her mother reported that Sarah Shah's medications have been changed and both Sarah Shah and her mother reported improvement in mood and behavior as a result. Sarah Shah will have surgery on Monday.   Subjective: Sarah Shah presented for an individual outpatient therapy session. The following was addressed during sessions.   Sarah Shah reported several recent stressors. She was diagnosed with gallstones at the ED and is scheduled to have surgery on Monday. Feelings related to this were explored along with anxiety management  strategies for before and after. In addition, monitoring mood and engaging in positive behaviors that she is physically capable of after surgery was discussed as a strategy to try to prevent any resurgence of mood challenges in the recovery period. Sarah Shah also has a number of midterms and school assignments to complete prior to surgery. Anxiety management techniques (including helpful thoughts) and executive functioning strategies were discussed. Sarah Shah decided not to move out at this time and therapist and Sarah Shah discussed some long-term planning associated with this. Family communication was also discussed with Sarah Shah identifying some behaviors and strategies that have help things to improve.   Diagnosis: Autism spectrum disorder  Anxiety  MENTAL HEALTH INTERVENTIONS USED DURING TREATMENT & PATIENT'S RESPONSE TO INTERVENTIONS:  Short-term Objective addressed today: Sarah Shah will be able to use appropriate coping strategies including helpful thoughts to maintain a low level of anxiety and depressive symptoms. Mental health techniques used: Objective was addressed in session through the use of Cognitive Behavioral Therapy and Solution-Oriented/Positive Psychology and discussion. Sarah Shah's response was positive. Progress Toward Goal: progressing  Short-term Objective addressed today: Sarah Shah will be able to create a plan to address weaknesses described in objective 3A.  AND Sarah Shah will be able to identify strategies to help navigate stressors associated with the life transitions and implement them  Mental health techniques used: Objective was addressed in session through the use of  Cognitive Behavioral Therapy and executive functioning skills,  and discussion. Sarah Shah's response was positive. Progress Toward Goal: progressing    PLAN  1. Sarah Shah will return for a therapy session.  2. Homework Given:  use anxiety management techniques before and after surgery and during and after midterms, monitor mood post surgery  and use discussed strategies to try to continue with adequate mood management. This homework will be reviewed with Sarah Shah at the next visit.  3. During the next session check in on recovery, mood, anxiety, and school.     Sarah Chess, [name]  Individual Treatment Plan  - please see the notes from 04/17/2021 & 05/01/2021 for complete treatment plan information.  Problem/Need: Sarah Shah has a history of anxiety and mood concerns - with some ongoing challenges in these areas  Long-Term Goal #1: Sarah Shah will be able to identify and address any mood or anxiety challenges and will maintain a low level of anxiety and depression symptoms as evidenced by self-report. Short-Term Objectives: Objective 1A: Sarah Shah will be able to rate her mood and anxiety  Objective 1B: Sarah Shah will be able to identify any negative thinking patterns that are perpetuating anxiety or depressive symptoms and replace these with more adaptive thoughts.  Objective 1C: Sarah Shah will be able to use appropriate coping strategies including helpful thoughts to maintain a low level of anxiety and depressive symptoms. Interventions: Cognitive Behavioral Therapy, Systems analyst, Motivational Interviewing, and Psycho-education/Bibliotherapy  coping skills and other evidenced-based practices will be used to promote progress towards healthy functioning and to help manage decrease symptoms associated with their diagnosis.  Treatment Regimen: Individual skill building sessions every 2-4 weeks to address treatment goal/objective Target Date: 04/2022 Responsible Party: therapist and patient Person delivering treatment: Licensed Psychologist Sarah Chess, [name] will support the patient's ability to achieve the goals identified. Resolved:  No  Problem/Need: Life transition - Sarah Shah is in the process of completing a major life transition including graduating from high school, starting college, and potentially working. Long-Term Goal #2: Sarah Shah will  successfully navigate these life transitions including being able to manage her stress level. Short-Term Objectives: Objective 2A: Sarah Shah will be able to identify stressors associated with the life transitions  Objective 2B: Paizli will be able to identify strategies to help navigate stressors associated with the life transitions and implement them  Interventions: Assertiveness/Communication, Systems analyst, and Psycho-education/Bibliotherapy , and other evidenced-based practices will be used to promote progress towards healthy functioning and to help manage decrease symptoms associated with their diagnosis.  Treatment Regimen: Individual skill building sessions every 2-4 weeks to address treatment goal/objective Target Date: 04/2022 Responsible Party: therapist and patient Person delivering treatment: Licensed Psychologist Sarah Chess, [name] will support the patient's ability to achieve the goals identified. Resolved:  No  Problem/Need: Life skills - Beautifull needs to develop some additional skills to facilitate independent living. Long-Term Goal #3: Saori will identify the additional life skills necessary to facilitate independent living. Short-Term Objectives: Objective 3A: Zymia will be able to identify areas that she needs to address to increase the likelihood of her being able to successfully live independently.   Objective 3B: Brisha will be able to create a plan to address weaknesses described in objective 3A.   Objective 3C: Steve will be able to implement steps on the plan developed in objective 3B. Interventions: Motivational Interviewing, Solution-Oriented/Positive Psychology, and Psycho-education/Bibliotherapy  daily life skills, and other evidenced-based practices will be used to promote progress towards healthy functioning and to help manage decrease symptoms associated with their diagnosis.  Treatment Regimen: Individual skill building sessions every 2-4 weeks to address treatment  goal/objective Target Date: 04/2022 Responsible Party: therapist and patient Person delivering treatment: Licensed Psychologist Sarah Chess,  [name] will support the patient's ability to achieve the goals identified. Resolved:  No  Sarah Chess, [name]

## 2022-03-13 ENCOUNTER — Encounter: Payer: Self-pay | Admitting: Surgery

## 2022-03-13 ENCOUNTER — Encounter
Admission: RE | Admit: 2022-03-13 | Discharge: 2022-03-13 | Disposition: A | Discharge status: Home / Self Care | Source: Ambulatory Visit | Attending: Surgery | Admitting: Surgery

## 2022-03-13 ENCOUNTER — Other Ambulatory Visit: Payer: Self-pay

## 2022-03-13 ENCOUNTER — Encounter: Payer: Self-pay | Admitting: Physician Assistant

## 2022-03-13 DIAGNOSIS — E282 Polycystic ovarian syndrome: Secondary | ICD-10-CM

## 2022-03-13 DIAGNOSIS — N926 Irregular menstruation, unspecified: Secondary | ICD-10-CM

## 2022-03-13 NOTE — Patient Instructions (Signed)
Your procedure is scheduled on: Wednesday 03/18/22 To find out your arrival time, please call [phone] between [place] on:   Tuesday 03/17/22 Report to the Registration Desk on the 1st floor of the [facility]. Valet parking is available.  If your arrival time is 6:00 am, do not arrive before that time as the [facility] entrance doors do not open until 6:00 am.  REMEMBER: Instructions that are not followed completely may result in serious medical risk, up to and including death; or upon the discretion of your surgeon and anesthesiologist your surgery may need to be rescheduled.  Do not eat food or drink any liquids after midnight the night before surgery.  No gum chewing or hard candies.  One week prior to surgery: Stop Anti-inflammatories (NSAIDS) such as Advil, Aleve, Ibuprofen, Motrin, Naproxen, Naprosyn and Aspirin based products such as Excedrin, Goody's Powder, BC Powder. You may however, continue to take Tylenol if needed for pain up until the day of surgery.  Stop ANY OVER THE COUNTER supplements until after surgery.  Continue taking all prescribed medications with the exception of the following: N/A  TAKE ONLY THESE MEDICATIONS THE MORNING OF SURGERY WITH A SIP OF WATER:  busPIRone (BUSPAR) 30 MG tablet  cloNIDine HCl (KAPVAY) 0.2 MG TB12 ER tablet  desogestrel-ethinyl estradiol (VIORELE) 0.15-0.02/0.01 MG (21/5) tablet  FLUoxetine (PROZAC) 40 MG capsule  omeprazole (PRILOSEC) 40 MG capsule  Antacid (take one the night before and one on the morning of surgery - helps to prevent nausea after surgery.) Topiramate ER (TROKENDI XR) 50 MG CP24   No Alcohol for 24 hours before or after surgery.  No Smoking including e-cigarettes for 24 hours before surgery.  No chewable tobacco products for at least 6 hours before surgery.  No nicotine patches on the day of surgery.  Do not use any "recreational" drugs for at least a week (preferably 2 weeks) before your surgery.   Please be advised that the combination of cocaine and anesthesia may have negative outcomes, up to and including death. If you test positive for cocaine, your surgery will be cancelled.  On the morning of surgery brush your teeth with toothpaste and water, you may rinse your mouth with mouthwash if you wish. Do not swallow any toothpaste or mouthwash.  Use CHG Soap or wipes as directed on instruction sheet. You can purchase Hibiclens soap from your pharmacy if you choose.  Do not wear lotions, powders, or perfumes. You can use deodorant under your arms.  Do not shave body hair from the neck down 48 hours before surgery.  Wear comfortable clothing (specific to your surgery type) to the hospital.  Do not wear jewelry, make-up, hairpins, clips or nail polish.  Contact lenses, hearing aids and dentures may not be worn into surgery.  Do not bring valuables to the hospital. [facility] is not responsible for any missing/lost belongings or valuables.   Notify your doctor if there is any change in your medical condition (cold, fever, infection).  If you are being discharged the day of surgery, you will not be allowed to drive home. You will need a responsible individual to drive you home and stay with you for 24 hours after surgery.   If you are taking public transportation, you will need to have a responsible individual with you.  If you are being admitted to the hospital overnight, leave your suitcase in the car. After surgery it may be brought to your room.  In case of  increased patient census, it may be necessary for you, the patient, to continue your postoperative care in the Same Day Surgery department.  After surgery, you can help prevent lung complications by doing breathing exercises.  Take deep breaths and cough every 1-2 hours. Your doctor may order a device called an Incentive Spirometer to help you take deep breaths. When coughing or sneezing, hold a pillow firmly against your  incision with both hands. This is called "splinting." Doing this helps protect your incision. It also decreases belly discomfort.  Surgery Visitation Policy:  Patients undergoing a surgery or procedure may have two family members or support persons with them as long as the person is not COVID-19 positive or experiencing its symptoms.   Inpatient Visitation:    Visiting hours are 7 a.m. to 8 p.m. Up to four visitors are allowed at one time in a patient room. The visitors may rotate out with other people during the day. One designated support person (adult) may remain overnight.  Due to an increase in RSV and influenza rates and associated hospitalizations, children ages 3 and under will not be able to visit patients in [facility]. Masks continue to be strongly recommended.  Please call the [place] Dept. at [phone] if you have any questions about these instructions.     Preparing for Surgery with CHLORHEXIDINE GLUCONATE (CHG) Soap  Chlorhexidine Gluconate (CHG) Soap or Hibiclens  o An antiseptic cleaner that kills germs and bonds with the skin to continue killing germs even after washing  o Used for showering the night before surgery and morning of surgery  Before surgery, you can play an important role by reducing the number of germs on your skin.  CHG (Chlorhexidine gluconate) soap is an antiseptic cleanser which kills germs and bonds with the skin to continue killing germs even after washing.  Please do not use if you have an allergy to CHG or antibacterial soaps. If your skin becomes reddened/irritated stop using the CHG.  1. Shower the NIGHT BEFORE SURGERY and the MORNING OF SURGERY with CHG soap.  2. If you choose to wash your hair, wash your hair first as usual with your normal shampoo.  3. After shampooing, rinse your hair and body thoroughly to remove the shampoo.  4. Use CHG as you would any other liquid soap. You can apply CHG directly  to the skin and wash gently with a scrungie or a clean washcloth.  5. Apply the CHG soap to your body only from the neck down. Do not use on open wounds or open sores. Avoid contact with your eyes, ears, mouth, and genitals (private parts). Wash face and genitals (private parts) with your normal soap.  6. Wash thoroughly, paying special attention to the area where your surgery will be performed.  7. Thoroughly rinse your body with warm water.  8. Do not shower/wash with your normal soap after using and rinsing off the CHG soap.  9. Pat yourself dry with a clean towel.  10. Wear clean pajamas to bed the night before surgery.  12. Place clean sheets on your bed the night of your first shower and do not sleep with pets.  13. Shower again with the CHG soap on the day of surgery prior to arriving at the hospital.  14. Do not apply any deodorants/lotions/powders.  15. Please wear clean clothes to the hospital.

## 2022-03-18 ENCOUNTER — Encounter: Payer: Self-pay | Admitting: Surgery

## 2022-03-18 ENCOUNTER — Ambulatory Visit: Admitting: Certified Registered"

## 2022-03-18 ENCOUNTER — Other Ambulatory Visit: Payer: Self-pay

## 2022-03-18 ENCOUNTER — Encounter
Admission: RE | Disposition: A | Discharge status: Home / Self Care | Payer: Self-pay | Source: Ambulatory Visit | Attending: Surgery

## 2022-03-18 ENCOUNTER — Ambulatory Visit
Admission: RE | Admit: 2022-03-18 | Discharge: 2022-03-18 | Disposition: A | Discharge status: Home / Self Care | Source: Ambulatory Visit | Attending: Surgery | Admitting: Surgery

## 2022-03-18 DIAGNOSIS — F840 Autistic disorder: Secondary | ICD-10-CM | POA: Insufficient documentation

## 2022-03-18 DIAGNOSIS — F909 Attention-deficit hyperactivity disorder, unspecified type: Secondary | ICD-10-CM | POA: Diagnosis not present

## 2022-03-18 DIAGNOSIS — K219 Gastro-esophageal reflux disease without esophagitis: Secondary | ICD-10-CM | POA: Insufficient documentation

## 2022-03-18 DIAGNOSIS — Z6841 Body Mass Index (BMI) 40.0 and over, adult: Secondary | ICD-10-CM | POA: Insufficient documentation

## 2022-03-18 DIAGNOSIS — N926 Irregular menstruation, unspecified: Secondary | ICD-10-CM

## 2022-03-18 DIAGNOSIS — E282 Polycystic ovarian syndrome: Secondary | ICD-10-CM

## 2022-03-18 DIAGNOSIS — R1011 Right upper quadrant pain: Secondary | ICD-10-CM | POA: Diagnosis present

## 2022-03-18 DIAGNOSIS — K8010 Calculus of gallbladder with chronic cholecystitis without obstruction: Secondary | ICD-10-CM | POA: Diagnosis not present

## 2022-03-18 HISTORY — DX: Anemia, unspecified: D64.9

## 2022-03-18 HISTORY — DX: Headache, unspecified: R51.9

## 2022-03-18 LAB — POCT PREGNANCY, URINE: Preg Test, Ur: NEGATIVE

## 2022-03-18 SURGERY — CHOLECYSTECTOMY, ROBOT-ASSISTED, LAPAROSCOPIC
Anesthesia: General

## 2022-03-18 MED ORDER — PROMETHAZINE HCL 25 MG/ML IJ SOLN: 6.2500 mg | INTRAMUSCULAR | Status: DC | PRN

## 2022-03-18 MED ORDER — ROCURONIUM BROMIDE 100 MG/10ML IV SOLN
INTRAVENOUS | Status: DC | PRN
  Administered 2022-03-18: 20 mg via INTRAVENOUS
  Administered 2022-03-18: 30 mg via INTRAVENOUS

## 2022-03-18 MED ORDER — GABAPENTIN 300 MG PO CAPS
ORAL_CAPSULE | ORAL | Status: AC
  Filled 2022-03-18: qty 1

## 2022-03-18 MED ORDER — ORAL CARE MOUTH RINSE: 15.0000 mL | Freq: Once | OROMUCOSAL | Status: AC

## 2022-03-18 MED ORDER — FAMOTIDINE 20 MG PO TABS
ORAL_TABLET | ORAL | Status: AC
  Filled 2022-03-18: qty 1

## 2022-03-18 MED ORDER — ACETAMINOPHEN 500 MG PO TABS
ORAL_TABLET | ORAL | Status: AC
  Filled 2022-03-18: qty 2

## 2022-03-18 MED ORDER — CEFAZOLIN IN SODIUM CHLORIDE 3-0.9 GM/100ML-% IV SOLN
3.0000 g | INTRAVENOUS | Status: AC
  Administered 2022-03-18: 3 g via INTRAVENOUS
  Filled 2022-03-18 (×2): qty 100

## 2022-03-18 MED ORDER — CELECOXIB 200 MG PO CAPS
ORAL_CAPSULE | ORAL | Status: AC
  Filled 2022-03-18: qty 1

## 2022-03-18 MED ORDER — HYDROCODONE-ACETAMINOPHEN 5-325 MG PO TABS: 1.0000 | ORAL_TABLET | Freq: Four times a day (QID) | ORAL | 0 refills | Status: DC | PRN

## 2022-03-18 MED ORDER — MIDAZOLAM HCL 2 MG/2ML IJ SOLN
INTRAMUSCULAR | Status: DC | PRN
  Administered 2022-03-18: 2 mg via INTRAVENOUS

## 2022-03-18 MED ORDER — 0.9 % SODIUM CHLORIDE (POUR BTL) OPTIME
TOPICAL | Status: DC | PRN
  Administered 2022-03-18: 500 mL

## 2022-03-18 MED ORDER — DEXMEDETOMIDINE HCL IN NACL 80 MCG/20ML IV SOLN
INTRAVENOUS | Status: DC | PRN
  Administered 2022-03-18: 8 ug via BUCCAL
  Administered 2022-03-18: 4 ug via BUCCAL
  Administered 2022-03-18: 8 ug via BUCCAL
  Administered 2022-03-18: 12 ug via BUCCAL

## 2022-03-18 MED ORDER — CELECOXIB 200 MG PO CAPS
200.0000 mg | ORAL_CAPSULE | ORAL | Status: AC
  Administered 2022-03-18: 200 mg via ORAL

## 2022-03-18 MED ORDER — OXYCODONE HCL 5 MG PO TABS
ORAL_TABLET | ORAL | Status: AC
  Filled 2022-03-18: qty 1

## 2022-03-18 MED ORDER — FENTANYL CITRATE (PF) 100 MCG/2ML IJ SOLN
INTRAMUSCULAR | Status: DC | PRN
  Administered 2022-03-18 (×4): 50 ug via INTRAVENOUS

## 2022-03-18 MED ORDER — LACTATED RINGERS IV SOLN: INTRAVENOUS | Status: DC

## 2022-03-18 MED ORDER — BUPIVACAINE-EPINEPHRINE 0.25% -1:200000 IJ SOLN
INTRAMUSCULAR | Status: DC | PRN
  Administered 2022-03-18: 50 mL via INTRAMUSCULAR

## 2022-03-18 MED ORDER — OXYCODONE HCL 5 MG/5ML PO SOLN: 5.0000 mg | Freq: Once | ORAL | Status: AC | PRN

## 2022-03-18 MED ORDER — CHLORHEXIDINE GLUCONATE CLOTH 2 % EX PADS
6.0000 | MEDICATED_PAD | Freq: Once | CUTANEOUS | Status: AC
  Administered 2022-03-18: 6 via TOPICAL

## 2022-03-18 MED ORDER — DEXAMETHASONE SODIUM PHOSPHATE 10 MG/ML IJ SOLN
INTRAMUSCULAR | Status: DC | PRN
  Administered 2022-03-18: 10 mg via INTRAVENOUS

## 2022-03-18 MED ORDER — CHLORHEXIDINE GLUCONATE 0.12 % MT SOLN
15.0000 mL | Freq: Once | OROMUCOSAL | Status: AC
  Administered 2022-03-18: 15 mL via OROMUCOSAL

## 2022-03-18 MED ORDER — GLYCOPYRROLATE 0.2 MG/ML IJ SOLN
INTRAMUSCULAR | Status: DC | PRN
  Administered 2022-03-18: .2 mg via INTRAVENOUS

## 2022-03-18 MED ORDER — FAMOTIDINE 20 MG PO TABS
20.0000 mg | ORAL_TABLET | Freq: Once | ORAL | Status: AC
  Administered 2022-03-18: 20 mg via ORAL

## 2022-03-18 MED ORDER — EPINEPHRINE PF 1 MG/ML IJ SOLN
INTRAMUSCULAR | Status: AC
  Filled 2022-03-18: qty 1

## 2022-03-18 MED ORDER — PROPOFOL 10 MG/ML IV BOLUS
INTRAVENOUS | Status: DC | PRN
  Administered 2022-03-18: 200 mg via INTRAVENOUS

## 2022-03-18 MED ORDER — PROPOFOL 10 MG/ML IV BOLUS
INTRAVENOUS | Status: AC
  Filled 2022-03-18: qty 20

## 2022-03-18 MED ORDER — INDOCYANINE GREEN 25 MG IV SOLR
1.2500 mg | Freq: Once | INTRAVENOUS | Status: AC
  Administered 2022-03-18: 1.25 mg via INTRAVENOUS
  Filled 2022-03-18: qty 0.5

## 2022-03-18 MED ORDER — SUCCINYLCHOLINE CHLORIDE 200 MG/10ML IV SOSY
PREFILLED_SYRINGE | INTRAVENOUS | Status: DC | PRN
  Administered 2022-03-18: 160 mg via INTRAVENOUS

## 2022-03-18 MED ORDER — FENTANYL CITRATE (PF) 100 MCG/2ML IJ SOLN
INTRAMUSCULAR | Status: AC
  Administered 2022-03-18: 25 ug via INTRAVENOUS
  Filled 2022-03-18: qty 2

## 2022-03-18 MED ORDER — FENTANYL CITRATE (PF) 100 MCG/2ML IJ SOLN
25.0000 ug | INTRAMUSCULAR | Status: DC | PRN
  Administered 2022-03-18 (×2): 25 ug via INTRAVENOUS

## 2022-03-18 MED ORDER — ACETAMINOPHEN 500 MG PO TABS
1000.0000 mg | ORAL_TABLET | ORAL | Status: AC
  Administered 2022-03-18: 1000 mg via ORAL

## 2022-03-18 MED ORDER — GABAPENTIN 300 MG PO CAPS
300.0000 mg | ORAL_CAPSULE | ORAL | Status: AC
  Administered 2022-03-18: 300 mg via ORAL

## 2022-03-18 MED ORDER — FENTANYL CITRATE (PF) 100 MCG/2ML IJ SOLN
INTRAMUSCULAR | Status: AC
  Filled 2022-03-18: qty 2

## 2022-03-18 MED ORDER — BUPIVACAINE HCL (PF) 0.25 % IJ SOLN
INTRAMUSCULAR | Status: AC
  Filled 2022-03-18: qty 30

## 2022-03-18 MED ORDER — SUGAMMADEX SODIUM 500 MG/5ML IV SOLN
INTRAVENOUS | Status: DC | PRN
  Administered 2022-03-18: 300 mg via INTRAVENOUS

## 2022-03-18 MED ORDER — MIDAZOLAM HCL 2 MG/2ML IJ SOLN
INTRAMUSCULAR | Status: AC
  Filled 2022-03-18: qty 2

## 2022-03-18 MED ORDER — CHLORHEXIDINE GLUCONATE 0.12 % MT SOLN
OROMUCOSAL | Status: AC
  Filled 2022-03-18: qty 15

## 2022-03-18 MED ORDER — BUPIVACAINE LIPOSOME 1.3 % IJ SUSP: 20.0000 mL | Freq: Once | INTRAMUSCULAR | Status: DC

## 2022-03-18 MED ORDER — ONDANSETRON HCL 4 MG/2ML IJ SOLN
INTRAMUSCULAR | Status: DC | PRN
  Administered 2022-03-18: 4 mg via INTRAVENOUS

## 2022-03-18 MED ORDER — ACETAMINOPHEN 10 MG/ML IV SOLN: 1000.0000 mg | Freq: Once | INTRAVENOUS | Status: DC | PRN

## 2022-03-18 MED ORDER — BUPIVACAINE LIPOSOME 1.3 % IJ SUSP
INTRAMUSCULAR | Status: AC
  Filled 2022-03-18: qty 20

## 2022-03-18 MED ORDER — DROPERIDOL 2.5 MG/ML IJ SOLN: 0.6250 mg | Freq: Once | INTRAMUSCULAR | Status: DC | PRN

## 2022-03-18 MED ORDER — OXYCODONE HCL 5 MG PO TABS
5.0000 mg | ORAL_TABLET | Freq: Once | ORAL | Status: AC | PRN
  Administered 2022-03-18: 5 mg via ORAL

## 2022-03-18 SURGICAL SUPPLY — 50 items
ADH SKN CLS APL DERMABOND .7 (GAUZE/BANDAGES/DRESSINGS) ×1
ADH SKN CLS LQ APL DERMABOND (GAUZE/BANDAGES/DRESSINGS) ×1
BAG PRESSURE INF REUSE 3000 (BAG) IMPLANT
CLIP LIGATING HEM O LOK PURPLE (MISCELLANEOUS) ×2 IMPLANT
COVER TIP SHEARS 8 DVNC (MISCELLANEOUS) ×2 IMPLANT
COVER TIP SHEARS 8MM DA VINCI (MISCELLANEOUS) ×1
DERMABOND ADVANCED .7 DNX12 (GAUZE/BANDAGES/DRESSINGS) ×2 IMPLANT
DERMABOND ADVANCED .7 DNX6 (GAUZE/BANDAGES/DRESSINGS) IMPLANT
DRAPE ARM DVNC X/XI (DISPOSABLE) ×8 IMPLANT
DRAPE COLUMN DVNC XI (DISPOSABLE) ×2 IMPLANT
DRAPE DA VINCI XI ARM (DISPOSABLE) ×4
DRAPE DA VINCI XI COLUMN (DISPOSABLE) ×1
ELECT CAUTERY BLADE 6.4 (BLADE) ×2 IMPLANT
GLOVE ORTHO TXT STRL SZ7.5 (GLOVE) ×4 IMPLANT
GOWN STRL REUS W/ TWL LRG LVL3 (GOWN DISPOSABLE) ×4 IMPLANT
GOWN STRL REUS W/ TWL XL LVL3 (GOWN DISPOSABLE) ×4 IMPLANT
GOWN STRL REUS W/TWL LRG LVL3 (GOWN DISPOSABLE) ×2
GOWN STRL REUS W/TWL XL LVL3 (GOWN DISPOSABLE) ×2
GRASPER SUT TROCAR 14GX15 (MISCELLANEOUS) ×2 IMPLANT
IRRIGATION STRYKERFLOW (MISCELLANEOUS) IMPLANT
IRRIGATOR STRYKERFLOW (MISCELLANEOUS)
IRRIGATOR SUCT 8 DISP DVNC XI (IRRIGATION / IRRIGATOR) IMPLANT
IRRIGATOR SUCTION 8MM XI DISP (IRRIGATION / IRRIGATOR)
IV NS IRRIG 3000ML ARTHROMATIC (IV SOLUTION) IMPLANT
KIT PINK PAD W/HEAD ARE REST (MISCELLANEOUS) ×1
KIT PINK PAD W/HEAD ARM REST (MISCELLANEOUS) ×2 IMPLANT
KIT TURNOVER KIT A (KITS) ×2 IMPLANT
LABEL OR SOLS (LABEL) ×2 IMPLANT
MANIFOLD NEPTUNE II (INSTRUMENTS) ×2 IMPLANT
NDL INSUFFLATION 14GA 120MM (NEEDLE) ×2 IMPLANT
NEEDLE HYPO 22GX1.5 SAFETY (NEEDLE) ×2 IMPLANT
NEEDLE INSUFFLATION 14GA 120MM (NEEDLE) ×1 IMPLANT
NS IRRIG 500ML POUR BTL (IV SOLUTION) ×2 IMPLANT
PACK LAP CHOLECYSTECTOMY (MISCELLANEOUS) ×2 IMPLANT
SEAL CANN UNIV 5-8 DVNC XI (MISCELLANEOUS) ×8 IMPLANT
SEAL XI 5MM-8MM UNIVERSAL (MISCELLANEOUS) ×4
SET TUBE SMOKE EVAC HIGH FLOW (TUBING) ×2 IMPLANT
SOL ELECTROSURG ANTI STICK (MISCELLANEOUS) ×1
SOLUTION ELECTROSURG ANTI STCK (MISCELLANEOUS) ×2 IMPLANT
SPIKE FLUID TRANSFER (MISCELLANEOUS) ×2 IMPLANT
SUT MNCRL 4-0 (SUTURE) ×1
SUT MNCRL 4-0 27XMFL (SUTURE) ×1
SUT VICRYL 0 UR6 27IN ABS (SUTURE) ×2 IMPLANT
SUTURE MNCRL 4-0 27XMF (SUTURE) ×2 IMPLANT
SYS BAG RETRIEVAL 10MM (BASKET) ×1
SYSTEM BAG RETRIEVAL 10MM (BASKET) ×2 IMPLANT
TRAP FLUID SMOKE EVACUATOR (MISCELLANEOUS) ×2 IMPLANT
TROCAR 12M 150ML BLUNT (TROCAR) IMPLANT
TROCAR Z-THREAD FIOS 11X100 BL (TROCAR) ×2 IMPLANT
WATER STERILE IRR 500ML POUR (IV SOLUTION) ×2 IMPLANT

## 2022-03-18 NOTE — Anesthesia Procedure Notes (Signed)
Procedure Name: Intubation Date/Time: 03/18/2022 4:29 PM  Performed by: Patience Musca., CRNAPre-anesthesia Checklist: Patient identified, Patient being monitored, Timeout performed, Emergency Drugs available and Suction available Patient Re-evaluated:Patient Re-evaluated prior to induction Oxygen Delivery Method: Circle system utilized Preoxygenation: Pre-oxygenation with 100% oxygen Induction Type: IV induction Ventilation: Mask ventilation without difficulty Laryngoscope Size: 3 and McGraph Grade View: Grade I Tube type: Oral Tube size: 7.0 mm Number of attempts: 1 Airway Equipment and Method: Stylet Placement Confirmation: ETT inserted through vocal cords under direct vision, positive ETCO2 and breath sounds checked- equal and bilateral Secured at: 21 cm Tube secured with: Tape Dental Injury: Teeth and Oropharynx as per pre-operative assessment

## 2022-03-18 NOTE — Interval H&P Note (Signed)
History and Physical Interval Note:  03/18/2022 2:33 PM  [name]  has presented today for surgery, with the diagnosis of chronic calculous cholecystitis.  The various methods of treatment have been discussed with the patient and family. After consideration of risks, benefits and other options for treatment, the patient has consented to  Procedure(s): XI ROBOTIC ASSISTED LAPAROSCOPIC CHOLECYSTECTOMY (N/A) [place] (ICG) (N/A) as a surgical intervention.  The patient's history has been reviewed, patient examined, no change in status, stable for surgery.  I have reviewed the patient's chart and labs.  Questions were answered to the patient's satisfaction.     Sarah Shah

## 2022-03-18 NOTE — Anesthesia Postprocedure Evaluation (Signed)
Anesthesia Post Note  Patient: Beki Greason  Procedure(s) Performed: XI ROBOTIC ASSISTED LAPAROSCOPIC CHOLECYSTECTOMY INDOCYANINE GREEN FLUORESCENCE IMAGING (ICG)  Patient location during evaluation: PACU Anesthesia Type: General Level of consciousness: awake and alert Pain management: pain level controlled Vital Signs Assessment: post-procedure vital signs reviewed and stable Respiratory status: spontaneous breathing, nonlabored ventilation, respiratory function stable and patient connected to nasal cannula oxygen Cardiovascular status: blood pressure returned to baseline and stable Postop Assessment: no apparent nausea or vomiting Anesthetic complications: no  No notable events documented.   Last Vitals:  Vitals:   03/18/22 1745 03/18/22 1800  BP: 114/82 111/73  Pulse: (!) 107 97  Resp:    Temp:    SpO2: 97% 100%    Last Pain:  Vitals:   03/18/22 1442  TempSrc: Oral  PainSc: 0-No pain                 Dimas Millin

## 2022-03-18 NOTE — Anesthesia Preprocedure Evaluation (Signed)
Anesthesia Evaluation  Patient identified by MRN, date of birth, ID band Patient awake    Reviewed: Allergy & Precautions, H&P , NPO status , Patient's Chart, lab work & pertinent test results, reviewed documented beta blocker date and time   Airway Mallampati: II  TM Distance: >3 FB Neck ROM: full    Dental  (+) Teeth Intact   Pulmonary neg shortness of breath, pneumonia, resolved   Pulmonary exam normal        Cardiovascular Exercise Tolerance: Good negative cardio ROS Normal cardiovascular exam Rhythm:regular Rate:Normal     Neuro/Psych  Headaches  Anxiety Depression     negative psych ROS   GI/Hepatic Neg liver ROS,GERD  Medicated,,  Endo/Other    Morbid obesity  Renal/GU negative Renal ROS  negative genitourinary   Musculoskeletal   Abdominal   Peds  Hematology  (+) Blood dyscrasia, anemia   Anesthesia Other Findings Past Medical History: No date: ADHD (attention deficit hyperactivity disorder) No date: Anemia No date: Anxiety No date: Autism spectrum No date: Constipated No date: Constipation No date: Depression No date: Development delay No date: GERD (gastroesophageal reflux disease) No date: Headache 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 No date: Iron deficiency No date: Learning disability No date: Obesity No date: ODD (oppositional defiant disorder) No date: PCOS (polycystic ovarian syndrome) No date: PMDD (premenstrual dysphoric disorder) No date: Pneumonia     Comment:  85 mos old and 72 mos old No date: Visual acuity reduced     Comment:  glasses Past Surgical History: 02/17/2021: BIOPSY     Comment:  Procedure: BIOPSY;  Surgeon: Irene Shipper, MD;                Location: [facility];  Service: Endoscopy;; 02/17/2021: COLONOSCOPY WITH PROPOFOL; N/A     Comment:  Procedure: COLONOSCOPY WITH PROPOFOL;  Surgeon: Irene Shipper, MD;  Location: [facility];  Service: Endoscopy;               Laterality: N/A; 02/17/2021: ESOPHAGOGASTRODUODENOSCOPY (EGD) WITH PROPOFOL; N/A     Comment:  Procedure: ESOPHAGOGASTRODUODENOSCOPY (EGD) WITH               PROPOFOL;  Surgeon: Irene Shipper, MD;  Location: [facility]               ENDOSCOPY;  Service: Endoscopy;  Laterality: N/A; 01/03/2018: TOOTH EXTRACTION; N/A     Comment:  Procedure: SURGICAL EXTRACTION OF TEETH #1, 16, 17, 32;               Surgeon: Michael Litter, DMD;  Location: [place];  Service:               Oral Surgery;  Laterality: N/A; BMI    Body Mass Index: 51.25 kg/m     Reproductive/Obstetrics negative OB ROS                             Anesthesia Physical Anesthesia Plan  ASA: 3  Anesthesia Plan: General ETT   Post-op Pain Management:    Induction:   PONV Risk Score and Plan: 4 or greater  Airway Management Planned:   Additional Equipment:   Intra-op Plan:   Post-operative Plan:   Informed Consent: I have reviewed the patients History and Physical, chart, labs and discussed the procedure including the risks, benefits and alternatives for the proposed anesthesia  with the patient or authorized representative who has indicated his/her understanding and acceptance.     Dental Advisory Given  Plan Discussed with: CRNA  Anesthesia Plan Comments:        Anesthesia Quick Evaluation

## 2022-03-18 NOTE — Op Note (Signed)
Robotic cholecystectomy with Indocyamine Green Ductal Imaging.   Pre-operative Diagnosis: Chronic calculus cholecystitis  Post-operative Diagnosis:  Same.  Procedure: Robotic assisted laparoscopic cholecystectomy with Indocyamine Green Ductal Imaging.   Surgeon: Ronny Bacon, M.D., FACS  Anesthesia: General. with endotracheal tube  Findings: Gallbladder packed with stones, no evidence of ICG fluorescence in gallbladder or cystic duct.  Long small caliber cystic duct, readily visualize common hepatic and common bile duct with firefly.  Estimated Blood Loss: 10 mL         Drains: None         Specimens: Gallbladder           Complications: none  Procedure Details  The patient was seen again in the Holding Room.  1.25 mg dose of ICG was administered intravenously.   The benefits, complications, treatment options, risks and expected outcomes were again reviewed with the patient. The likelihood of improving the patient's symptoms with return to their baseline status is good.  The patient and/or family concurred with the proposed plan, giving informed consent, again alternatives reviewed.  The patient was taken to Operating Room, identified, and the procedure verified as robotic assisted laparoscopic cholecystectomy.  Prior to the induction of general anesthesia, antibiotic prophylaxis was administered. VTE prophylaxis was in place. General endotracheal anesthesia was then administered and tolerated well. The patient was positioned in the supine position.  After the induction, the abdomen was prepped with Chloraprep and draped in the sterile fashion.  A Time Out was held and the above information confirmed. After local infiltration of quarter percent Marcaine with epinephrine, stab incision was made left upper quadrant.  Just below the costal margin at Palmer's point, approximately midclavicular line the Veres needle is passed with sensation of the layers to penetrate the abdominal wall and  into the peritoneum.  Saline drop test is confirmed peritoneal placement.  Insufflation is initiated with carbon dioxide to pressures of 15 mmHg.  Right para-umbilical local infiltration with quarter percent Marcaine with epinephrine is utilized.  Made a 12 mm incision on the right periumbilical site, I advanced an optical 12 mm bariatric length Ethicon port under direct visualization into the peritoneal cavity.  Once the peritoneum was penetrated, insufflation was initiated.  The trocar was then advanced into the abdominal cavity under direct visualization. Pneumoperitoneum was then continued utilizing CO2 at 15 mmHg or less and tolerated well without any adverse changes in the patient's vital signs.  Two bariatric length 8.5-mm ports were placed in the left lower quadrant and laterally, and one to the right lower quadrant, all under direct vision. All skin incisions  were infiltrated with a local anesthetic agent, using a spinal needle, before making the incision and placing the trocars.  The patient was positioned  in reverse Trendelenburg, tilted the patient's left side down.  Da Vinci XI robot was then positioned on to the patient's left side, and docked. The gallbladder was identified, the fundus grasped via the arm 4 Prograsp and retracted cephalad. Adhesions were lysed with scissors and cautery.  The infundibulum was identified grasped and retracted laterally, exposing the peritoneum overlying the triangle of Calot. This was then opened and dissected using cautery & scissors. An extended critical view of the cystic duct and cystic artery was obtained, aided by the ICG via FireFly which improved localization of the ductal anatomy.    The cystic duct was clearly identified and dissected to isolation.   Artery well isolated and clipped, and the cystic duct was triple clipped and divided  with scissors, as close to the gallbladder neck as feasible, thus leaving two on the remaining stump.  The specimen  side of the artery is sealed with bipolar and divided with monopolar scissors.   The gallbladder was taken from the gallbladder fossa in a retrograde fashion with the electrocautery. The gallbladder was removed and placed in an Endocatch bag.  The liver bed is inspected. Hemostasis was confirmed.  The robot was undocked and moved away from the operative field. No irrigation was utilized. The gallbladder and Endocatch sac were then removed through the infraumbilical port site.   Inspection of the right upper quadrant was performed. No bleeding, bile duct injury or leak, or bowel injury was noted. The infra-umbilical port site fascia was closed with interrumpted 0 Vicryl sutures using PMI/cone under direct visualization. Pneumoperitoneum was released and ports removed.  4-0 subcuticular Monocryl was used to close the skin. Dermabond was  applied.  The patient was then extubated and brought to the recovery room in stable condition. Sponge, lap, and needle counts were correct at closure and at the conclusion of the case.               Ronny Bacon, M.D., [facility] 03/18/2022 5:20 PM

## 2022-03-18 NOTE — Transfer of Care (Signed)
Immediate Anesthesia Transfer of Care Note  Patient: Sarah Shah  Procedure(s) Performed: XI ROBOTIC ASSISTED LAPAROSCOPIC CHOLECYSTECTOMY INDOCYANINE GREEN FLUORESCENCE IMAGING (ICG)  Patient Location: PACU  Anesthesia Type:General  Level of Consciousness: drowsy  Airway & Oxygen Therapy: Patient Spontanous Breathing and Patient connected to face mask oxygen  Post-op Assessment: Report given to RN  Post vital signs: stable  Last Vitals:  Vitals Value Taken Time  BP 138/78 03/18/22 1726  Temp    Pulse 101 03/18/22 1727  Resp    SpO2 98 % 03/18/22 1727  Vitals shown include unvalidated device data.  Last Pain:  Vitals:   03/18/22 1442  TempSrc: Oral  PainSc: 0-No pain      Patients Stated Pain Goal: 0 ([id number])  Complications: No notable events documented.

## 2022-03-18 NOTE — Discharge Instructions (Signed)

## 2022-03-20 LAB — SURGICAL PATHOLOGY

## 2022-03-20 NOTE — Progress Notes (Signed)
noted 

## 2022-03-24 ENCOUNTER — Ambulatory Visit: Admitting: Physician Assistant

## 2022-03-24 ENCOUNTER — Telehealth: Payer: Self-pay | Admitting: Nurse Practitioner

## 2022-03-24 NOTE — Telephone Encounter (Signed)
Called and spoke with patients mother. She would like to r/s appointment for today. Patient is doing well and recovering well from her Cholecystectomy and her symptoms of acid reflux are much better. Patient was rescheduled to next available appointment.

## 2022-03-24 NOTE — Telephone Encounter (Signed)
Patient mom called requesting to speak with a nurse ,mom said Sarah Shah had gall bladder removed on Wednesday and she dont know if she should come for todays appt ..please advise

## 2022-03-25 ENCOUNTER — Encounter: Payer: Self-pay | Admitting: Physician Assistant

## 2022-03-25 ENCOUNTER — Ambulatory Visit (INDEPENDENT_AMBULATORY_CARE_PROVIDER_SITE_OTHER): Admitting: Clinical

## 2022-03-25 DIAGNOSIS — F419 Anxiety disorder, unspecified: Secondary | ICD-10-CM | POA: Diagnosis not present

## 2022-03-25 DIAGNOSIS — F32A Depression, unspecified: Secondary | ICD-10-CM

## 2022-03-25 DIAGNOSIS — F840 Autistic disorder: Secondary | ICD-10-CM | POA: Diagnosis not present

## 2022-03-25 NOTE — Progress Notes (Signed)
[place] Counselor/Therapist Progress Note  Patient ID: Sarah Shah, MRN: [id number]    Date: 03/25/22  Time Spent: 10:39 am - 11:22 am: 43 Minutes  Type of Service Provided Individual Therapy  Type of Contact virtual (via [place] with real time audio and visual interaction)  Patient Location: home       Provider Location: office  Sarah Shah participated from home, via video, and consented to treatment. Therapist participated from office.    Mental Status Exam: Appearance:  Slightly disheveled       Behavior: Drowsy  Motor: Normal  Speech/Language:  Clear and Coherent  Affect: Appropriate  Mood: normal  Thought process: normal  Thought content:   WNL  Sensory/Perceptual disturbances:   WNL  Orientation: oriented to person, place, time/date, and situation  Attention: Fair  Concentration: Fair  Memory: WNL  Fund of knowledge:  Fair  Insight:   Fair  Judgment:  Good  Impulse Control: Good   Risk Assessment:  Danger to Self:  No - denied  Self-injurious Behavior: No- denied  Danger to Others: No Duty to Warn:no  Presenting Problems, Reported Symptoms, and /or Interim History: Sarah Shah presented for a session to address life stress, mood, and anxiety.   Subjective: Sarah Shah presented for an individual outpatient therapy session. The following was addressed during sessions.   Sarah Shah rated her mood and anxiety as neutral. She is experiencing some frustration due to recovery from surgery. Her surgery went well and she was able to use anxiety management strategies to help reduce her worry beforehand. She is currently feeling a bit frustrated because of the physical limitations from surgery that are not allowing her to do everything that she wants. Mood was explored with Sarah Shah reporting that she is feeling boredom more than down moods. Strategies she could use to address this were discussed. Strategies to help her successfully attend class tomorrow were  also discussed. She reported that she did well on her midterms and used anxiety management strategies, which was helpful. She has reconnected with a friend. Ways for her to be a supportive friend while also keeping some boundaries and making sure that she able to manage any stress or negativity that this person may bring to her life was discussed. The first signs that Sarah Shah recognizes as indicating that she is experiencing a down mood was discussed as well as how to address this if it occurs. Therapist and Sarah Shah started to talk about more long term goals.    Interventions/Psychotherapy Techniques Used During Session: Cognitive Behavioral Therapy and Solution-Oriented/Positive Psychology  Diagnosis: Autism spectrum disorder  Anxiety  Depression, unspecified depression type  MENTAL HEALTH INTERVENTIONS USED DURING TREATMENT & PATIENT'S RESPONSE TO INTERVENTIONS:  Short-term Objective addressed today: Sarah Shah will be able to use appropriate coping strategies including helpful thoughts to maintain a low level of anxiety and depressive symptoms. Mental health techniques used: Objective was addressed in session through the use of Cognitive Behavioral Therapy and discussion. Sarah Shah's response was positive.  Progress Toward Goal: progressing  Short-term Objective addressed today: Sarah Shah will be able to identify strategies to help navigate stressors associated with the life transitions and implement them  Mental health techniques used: Objective was addressed in session through the use of Cognitive Behavioral Therapy and Solution-Oriented/Positive Psychology and discussion. Sarah Shah's response was positive.  Progress Toward Goal: progressing    PLAN  1. Sarah Shah will return for a therapy session.   2. Homework Given:  talk to her professor about accomodation she may need  due to surgery to get through class, continue with morning check in about mood and engage in strategies to try to improve mood if she is feeling  stressed or down. This homework will be reviewed with Sarah Shah at the next visit.  3. During the next session talk about volunteering with animals and expanding opportunities for social connection.      Sarah Chess, [name]   Individual Treatment Plan  - please see the notes from 04/17/2021 & 05/01/2021 for complete treatment plan information.  Problem/Need: Sarah Shah has a history of anxiety and mood concerns - with some ongoing challenges in these areas  Long-Term Goal #1: Sarah Shah will be able to identify and address any mood or anxiety challenges and will maintain a low level of anxiety and depression symptoms as evidenced by self-report. Short-Term Objectives: Objective 1A: Sarah Shah will be able to rate her mood and anxiety  Objective 1B: Sarah Shah will be able to identify any negative thinking patterns that are perpetuating anxiety or depressive symptoms and replace these with more adaptive thoughts.  Objective 1C: Sarah Shah will be able to use appropriate coping strategies including helpful thoughts to maintain a low level of anxiety and depressive symptoms. Interventions: Cognitive Behavioral Therapy, Systems analyst, Motivational Interviewing, and Psycho-education/Bibliotherapy  coping skills and other evidenced-based practices will be used to promote progress towards healthy functioning and to help manage decrease symptoms associated with their diagnosis.  Treatment Regimen: Individual skill building sessions every 2-4 weeks to address treatment goal/objective Target Date: 04/2022 Responsible Party: therapist and patient Person delivering treatment: Licensed Psychologist Sarah Chess, [name] will support the patient's ability to achieve the goals identified. Resolved:  No  Problem/Need: Life transition - Sarah Shah is in the process of completing a major life transition including graduating from high school, starting college, and potentially working. Long-Term Goal #2: Sarah Shah will successfully navigate these  life transitions including being able to manage her stress level. Short-Term Objectives: Objective 2A: Sarah Shah will be able to identify stressors associated with the life transitions  Objective 2B: Sally-Anne will be able to identify strategies to help navigate stressors associated with the life transitions and implement them  Interventions: Assertiveness/Communication, Systems analyst, and Psycho-education/Bibliotherapy , and other evidenced-based practices will be used to promote progress towards healthy functioning and to help manage decrease symptoms associated with their diagnosis.  Treatment Regimen: Individual skill building sessions every 2-4 weeks to address treatment goal/objective Target Date: 04/2022 Responsible Party: therapist and patient Person delivering treatment: Licensed Psychologist Sarah Chess, [name] will support the patient's ability to achieve the goals identified. Resolved:  No  Problem/Need: Life skills - Aurie needs to develop some additional skills to facilitate independent living. Long-Term Goal #3: Tramanh will identify the additional life skills necessary to facilitate independent living. Short-Term Objectives: Objective 3A: Cheyeanne will be able to identify areas that she needs to address to increase the likelihood of her being able to successfully live independently.   Objective 3B: Paulyne will be able to create a plan to address weaknesses described in objective 3A.   Objective 3C: Shahadah will be able to implement steps on the plan developed in objective 3B. Interventions: Motivational Interviewing, Solution-Oriented/Positive Psychology, and Psycho-education/Bibliotherapy  daily life skills, and other evidenced-based practices will be used to promote progress towards healthy functioning and to help manage decrease symptoms associated with their diagnosis.  Treatment Regimen: Individual skill building sessions every 2-4 weeks to address treatment goal/objective Target Date:  04/2022 Responsible Party: therapist and patient Person delivering treatment: Licensed Psychologist  Sarah Chess, [name] will support the patient's ability to achieve the goals identified. Resolved:  No   Sarah Chess, [name]

## 2022-03-27 ENCOUNTER — Encounter: Payer: Self-pay | Admitting: Physician Assistant

## 2022-03-30 ENCOUNTER — Encounter: Payer: Self-pay | Admitting: Physician Assistant

## 2022-03-30 NOTE — Progress Notes (Signed)
[facility] POST-OP OFFICE VISIT  03/31/2022  HPI: Sarah Shah is a 22 y.o. female 13 days s/p Robotic Chole.  Felt a little gassy, following broccoli and cheese soup.  Otherwise bowels seem to be working well.  Denies nausea or vomiting.  Vital signs: BP 94/64   Pulse 92   Temp 98.1 F (36.7 C) (Oral)   Ht 5\' 5"  (1.651 m)   Wt (!) 304 lb (137.9 kg)   LMP 02/19/2022 (Approximate) Comment: PCOS  SpO2 100%   BMI 50.59 kg/m    Physical Exam: Constitutional: She appears back at her baseline, calm cool collected. Abdomen: Nondistended, soft and nontender. Skin: Extraction site with little more induration/inflammatory healing process.  No evidence of erythema or ecchymosis.  Incisions are otherwise clean dry and intact.  Assessment/Plan: This is a 22 y.o. female 13 days s/p Robotic Chole.   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  Will be glad to see her back as needed.  We remain readily available.   Campbell Lerner M.D., FACS 03/31/2022, 9:31 AM

## 2022-03-31 ENCOUNTER — Other Ambulatory Visit: Payer: Self-pay

## 2022-03-31 ENCOUNTER — Ambulatory Visit (INDEPENDENT_AMBULATORY_CARE_PROVIDER_SITE_OTHER): Admitting: Surgery

## 2022-03-31 ENCOUNTER — Encounter: Payer: Self-pay | Admitting: Surgery

## 2022-03-31 VITALS — BP 94/64 | HR 92 | Temp 98.1°F | Ht 65.0 in | Wt 304.0 lb

## 2022-03-31 DIAGNOSIS — Z09 Encounter for follow-up examination after completed treatment for conditions other than malignant neoplasm: Secondary | ICD-10-CM

## 2022-03-31 DIAGNOSIS — Z9049 Acquired absence of other specified parts of digestive tract: Secondary | ICD-10-CM | POA: Insufficient documentation

## 2022-03-31 DIAGNOSIS — K8010 Calculus of gallbladder with chronic cholecystitis without obstruction: Secondary | ICD-10-CM

## 2022-03-31 NOTE — Patient Instructions (Signed)

## 2022-04-01 ENCOUNTER — Encounter: Payer: Self-pay | Admitting: Physician Assistant

## 2022-04-09 ENCOUNTER — Encounter: Payer: Self-pay | Admitting: Surgery

## 2022-04-10 ENCOUNTER — Encounter: Payer: Self-pay | Admitting: Intensive Care

## 2022-04-10 ENCOUNTER — Other Ambulatory Visit (INDEPENDENT_AMBULATORY_CARE_PROVIDER_SITE_OTHER): Payer: Self-pay | Admitting: Pediatric Endocrinology

## 2022-04-10 ENCOUNTER — Emergency Department
Admission: EM | Admit: 2022-04-10 | Discharge: 2022-04-10 | Disposition: A | Discharge status: Home / Self Care | Attending: Emergency Medicine | Admitting: Emergency Medicine

## 2022-04-10 ENCOUNTER — Other Ambulatory Visit: Payer: Self-pay

## 2022-04-10 DIAGNOSIS — F840 Autistic disorder: Secondary | ICD-10-CM | POA: Diagnosis not present

## 2022-04-10 DIAGNOSIS — R7301 Impaired fasting glucose: Secondary | ICD-10-CM

## 2022-04-10 DIAGNOSIS — Z4800 Encounter for change or removal of nonsurgical wound dressing: Secondary | ICD-10-CM | POA: Diagnosis present

## 2022-04-10 DIAGNOSIS — Z5189 Encounter for other specified aftercare: Secondary | ICD-10-CM

## 2022-04-10 LAB — CBC WITH DIFFERENTIAL/PLATELET
Abs Immature Granulocytes: 0.01 10*3/uL (ref 0.00–0.07)
Basophils Absolute: 0.1 10*3/uL (ref 0.0–0.1)
Basophils Relative: 1 %
Eosinophils Absolute: 0.1 10*3/uL (ref 0.0–0.5)
Eosinophils Relative: 2 %
HCT: 42.9 % (ref 36.0–46.0)
Hemoglobin: 14.6 g/dL (ref 12.0–15.0)
Immature Granulocytes: 0 %
Lymphocytes Relative: 35 %
Lymphs Abs: 2.7 10*3/uL (ref 0.7–4.0)
MCH: 29.7 pg (ref 26.0–34.0)
MCHC: 34 g/dL (ref 30.0–36.0)
MCV: 87.4 fL (ref 80.0–100.0)
Monocytes Absolute: 0.6 10*3/uL (ref 0.1–1.0)
Monocytes Relative: 7 %
Neutro Abs: 4.3 10*3/uL (ref 1.7–7.7)
Neutrophils Relative %: 55 %
Platelets: 348 10*3/uL (ref 150–400)
RBC: 4.91 MIL/uL (ref 3.87–5.11)
RDW: 11.9 % (ref 11.5–15.5)
WBC: 7.7 10*3/uL (ref 4.0–10.5)
nRBC: 0 % (ref 0.0–0.2)

## 2022-04-10 LAB — COMPREHENSIVE METABOLIC PANEL
ALT: 20 U/L (ref 0–44)
AST: 19 U/L (ref 15–41)
Albumin: 3.9 g/dL (ref 3.5–5.0)
Alkaline Phosphatase: 102 U/L (ref 38–126)
Anion gap: 13 (ref 5–15)
BUN: 15 mg/dL (ref 6–20)
CO2: 22 mmol/L (ref 22–32)
Calcium: 10 mg/dL (ref 8.9–10.3)
Chloride: 104 mmol/L (ref 98–111)
Creatinine, Ser: 0.54 mg/dL (ref 0.44–1.00)
GFR, Estimated: >60 mL/min (ref >60)
Glucose, Bld: 81 mg/dL (ref 70–99)
Potassium: 3.9 mmol/L (ref 3.5–5.1)
Sodium: 139 mmol/L (ref 135–145)
Total Bilirubin: 0.4 mg/dL (ref 0.3–1.2)
Total Protein: 7.9 g/dL (ref 6.5–8.1)

## 2022-04-10 MED ORDER — MUPIROCIN 2 % EX OINT: 1.0000 | TOPICAL_OINTMENT | Freq: Two times a day (BID) | CUTANEOUS | 0 refills | Status: DC

## 2022-04-10 NOTE — ED Triage Notes (Signed)
Patient reports she had gallbladder surgery X1 month ago and the incision site is infected.   Redness and warmth noted around incision site.

## 2022-04-10 NOTE — Discharge Instructions (Addendum)
The wound overall looks good you may be developing a mild infection.  Please apply the antibiotic ointment twice daily for the next 7 days.  Please avoid any dressing that may be causing an allergic reaction.  Please avoid scratching or touching the wound.  If you develop increasing redness increasing pain or fever please return to the emergency department or see your surgeon.

## 2022-04-10 NOTE — ED Provider Notes (Signed)
[facility] Provider Note    Event Date/Time   First MD Initiated Contact with Patient 04/10/22 1743     (approximate)   History   Post-op Problem   HPI  Sarah Shah is a 22 y.o. female past medical history of autism spectrum disorder, recent cholecystectomy who presents because of concern for wound infection.  Patient had gallbladder surgery at the beginning of the month.  Has since seen Dr. Christian Mate in follow-up on 3/19.  Yesterday noticed some drainage from the midline lower laparotomy site was concerned about infection.  They have been putting a dressing on it that mom thinks she is having a reaction to.  She has been putting Neosporin on it.     Past Medical History:  Diagnosis Date   ADHD (attention deficit hyperactivity disorder)    Anemia    Anxiety    Autism spectrum    Constipated    Constipation    Depression    Development delay    GERD (gastroesophageal reflux disease)    Headache    Insulin resistance    Iron deficiency    Learning disability    Obesity    ODD (oppositional defiant disorder)    PCOS (polycystic ovarian syndrome)    PMDD (premenstrual dysphoric disorder)    Pneumonia    3 mos old and 11 mos old   Visual acuity reduced    glasses    Patient Active Problem List   Diagnosis Date Noted   Status post laparoscopic cholecystectomy 03/31/2022   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   Generalized anxiety disorder 02/16/2022   ADHD (attention deficit hyperactivity disorder) 02/16/2022   Migraines 10/03/2021   Abnormal CT of the abdomen    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/2020   Iron deficiency 04/25/2020   Learning disability    GERD (gastroesophageal reflux disease) 07/20/2019   Vitamin D deficiency 07/18/2019   Severe episode of recurrent major depressive disorder, without psychotic features ([place]) 01/25/2019   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   PCOS (polycystic ovarian syndrome) 10/05/2016   Flat  feet, bilateral 12/19/2015   Insulin resistance 12/03/2015   Class 3 severe obesity with serious comorbidity and body mass index (BMI) of 50.0 to 59.9 in adult ([place]) 08/27/2014   Oppositional defiant disorder    Autism spectrum disorder    DEVELOPMENTAL DELAY 11/29/2006   Developmental delay 11/29/2006   Attention deficit hyperactivity disorder (ADHD), combined type 05/13/2006     Physical Exam  Triage Vital Signs: ED Triage Vitals  Enc Vitals Group     BP 04/10/22 1717 119/79     Pulse Rate 04/10/22 1712 99     Resp 04/10/22 1712 20     Temp 04/10/22 1712 98.2 F (36.8 C)     Temp Source 04/10/22 1712 Oral     SpO2 04/10/22 1712 100 %     Weight 04/10/22 1713 (!) 304 lb (137.9 kg)     Height 04/10/22 1713 5\' 5"  (1.651 m)     Head Circumference --      Peak Flow --      Pain Score 04/10/22 1713 4     Pain Loc --      Pain Edu? --      Excl. in [place]? --     Most recent vital signs: Vitals:   04/10/22 1712 04/10/22 1717  BP:  119/79  Pulse: 99   Resp: 20   Temp: 98.2 F (36.8 C)   SpO2: 100%  General: Awake, no distress.  CV:  Good peripheral perfusion.  Resp:  Normal effort.  Abd:  No distention.  Midline lap site has small area of yellow scabbing on the right side no dehiscence minimal surrounding erythema no active drainage nontender Mild erythema outline of bandage surrounding the site Neuro:             Awake, Alert, Oriented x 3  Other:     ED Results / Procedures / Treatments  Labs (all labs ordered are listed, but only abnormal results are displayed) Labs Reviewed  CBC WITH DIFFERENTIAL/PLATELET  COMPREHENSIVE METABOLIC PANEL     EKG     RADIOLOGY    PROCEDURES:  Critical Care performed: No  Procedures  MEDICATIONS ORDERED IN ED: Medications - No data to display   IMPRESSION / MDM / [place] / ED COURSE  I reviewed the triage vital signs and the nursing notes.                              Patient's presentation  is most consistent with acute, uncomplicated illness.  Differential diagnosis includes, but is not limited to, contact dermatitis, postoperative cellulitis, serous drainage  Patient is a 22 year old female with recent lap chole at the beginning of the month who presents because of concern for one of the lap sites being infected.  Noticed drainage in the midline lower laparotomy site yesterday.  Has not had fevers chills.  On exam the site does not appear grossly infected to me.  The right side has looks to me like serous fluid that is scabbed over but is not purulent there is no active drainage there is some minimal surrounding erythema.  There is a larger area of erythema is within the shape of a square dressing which I suspect is a contact dermatitis.  Patient does note that it is somewhat itchy.  Mom has been putting Neosporin on the wound.  Recommended that they switch the Neosporin which she could be allergic to to mupirocin.  Do not think she needs oral antibiotics for this wound at this time as my suspicion for cellulitis or abscess is low.  Did recommend keeping it open to avoid any ongoing allergic reaction.  Discussed reasons to return to ED.  Encouraged follow-up with general surgery.     FINAL CLINICAL IMPRESSION(S) / ED DIAGNOSES   Final diagnoses:  Visit for wound check     Rx / DC Orders   ED Discharge Orders          Ordered    mupirocin ointment (BACTROBAN) 2 %  2 times daily        04/10/22 1757             Note:  This document was prepared using Dragon voice recognition software and may include unintentional dictation errors.   Rada Hay, MD 04/10/22 [phone]

## 2022-04-13 ENCOUNTER — Telehealth: Payer: Self-pay

## 2022-04-13 NOTE — Transitions of Care (Post Inpatient/ED Visit) (Signed)
   04/13/2022  Name: Liz Bahar MRN: [id number] DOB: November 17, 2000  Today's TOC FU Call Status: Today's TOC FU Call Status:: Successful TOC FU Call Competed TOC FU Call Complete Date: 04/13/22  Transition Care Management Follow-up Telephone Call Date of Discharge: 04/10/22 Discharge Facility: [facility] Type of Discharge: Emergency Department Reason for ED Visit: Other: (visit for wound check) How have you been since you were released from the hospital?: Better Any questions or concerns?: No  Items Reviewed: Did you receive and understand the discharge instructions provided?: Yes Medications obtained and verified?: Yes (Medications Reviewed) Any new allergies since your discharge?: No Dietary orders reviewed?: NA Do you have support at home?: Yes  Home Care and Equipment/Supplies: [place] Ordered?: NA Any new equipment or medical supplies ordered?: NA  Functional Questionnaire: Do you need assistance with bathing/showering or dressing?: No Do you need assistance with meal preparation?: No Do you need assistance with eating?: No Do you have difficulty maintaining continence: No Do you need assistance with getting out of bed/getting out of a chair/moving?: No Do you have difficulty managing or taking your medications?: No  Follow up appointments reviewed: PCP Follow-up appointment confirmed?: No (declined, will follow up with surgeon) MD Provider Line Number:[phone] Given: Yes [facility] Follow-up appointment confirmed?: Yes Date of Specialist follow-up appointment?: 04/17/22 Follow-Up Specialty Provider:: Ronny Bacon, MD- Surgeon Do you need transportation to your follow-up appointment?: No Do you understand care options if your condition(s) worsen?: Yes-patient verbalized understanding    Norton Blizzard, [place] ([place])  [place] [phone]

## 2022-04-14 ENCOUNTER — Ambulatory Visit: Admitting: Clinical

## 2022-04-14 DIAGNOSIS — F419 Anxiety disorder, unspecified: Secondary | ICD-10-CM | POA: Diagnosis not present

## 2022-04-14 DIAGNOSIS — F32A Depression, unspecified: Secondary | ICD-10-CM | POA: Diagnosis not present

## 2022-04-14 DIAGNOSIS — F840 Autistic disorder: Secondary | ICD-10-CM

## 2022-04-14 NOTE — Progress Notes (Signed)
[facility] Counselor/Therapist Progress Note  Patient ID: Ibet Waag, MRN: [id number]    Date: 04/14/22  Time Spent: 10:05 am - 11:00 am: 34 Minutes  Type of Service Provided Individual Therapy  Type of Contact in-person Location: office  Mental Status Exam: Appearance:  Casual     Behavior: Appropriate  Motor: Normal  Speech/Language:  Clear and Coherent and Normal Rate  Affect: A bit Flat but not atypical for client   Mood: normal  Thought process: normal  Thought content:   WNL  Sensory/Perceptual disturbances:   WNL  Orientation: oriented to person, place, time/date, and situation  Attention: Good  Concentration: Fair  Memory: Justice of knowledge:  Fair  Insight:   Fair  Judgment:  Fair  Impulse Control: Good   Risk Assessment:  Danger to Self:  No - denied SI Self-injurious Behavior: No - denied SIB Danger to Others: No Duty to Warn:no  Presenting Problems, Reported Symptoms, and /or Interim History: Darie presented for a session to address life stress and anxiety.   Subjective: Makyia presented for an individual outpatient therapy session. The following was addressed during sessions.   Ghia rated her mood, anxiety, and anger/frustration as neutral. She indicated that she had a rough day on Easter, as she ended up feeling negatively about herself. This was discussed along with strategies that could be helpful if she were to feel that way again. She reported feeling better today. Some family frustrations were discussed, along with stressors associated with her recovery from surgery. How to adapt to the change was discussed. School has been going well and next steps for school were discussed.    Interventions/Psychotherapy Techniques Used During Session: Cognitive Behavioral Therapy and Solution-Oriented/Positive Psychology  Diagnosis: Autism spectrum disorder  Anxiety  Depression, unspecified depression type  MENTAL HEALTH INTERVENTIONS  USED DURING TREATMENT & PATIENT'S RESPONSE TO INTERVENTIONS:  Short-term Objective addressed today: Keyanah will be able to use appropriate coping strategies including helpful thoughts to maintain a low level of anxiety and depressive symptoms. Mental health techniques used: Objective was addressed in session through the use of Cognitive Behavioral Therapy and discussion. Clarise's response was positive.  Progress Toward Goal: some progress  Short-term Objective addressed today:Kajal will be able to identify strategies to help navigate stressors associated with the life transitions and implement them  Mental health techniques used: Objective was addressed in session through the use of  Cognitive Behavioral Therapy and Solution-Oriented/Positive Psychology and discussion. Frannie's response was positive.  Progress Toward Goal: progressing    PLAN  1. Acura will return for a therapy session.   2. Homework Given:  email her advisor this week to get set up for summer classes, continue to engage in positive activities to manage feelings of stress/pressure. This homework will be reviewed with [name] at the next visit.  3. During the next session check in on mood, anxiety, school and next semester, and stress level.     [name], [name]  Individual Treatment Plan  - please see the notes from 04/17/2021 & 05/01/2021 for complete treatment plan information.  Problem/Need: Emaree has a history of anxiety and mood concerns - with some ongoing challenges in these areas  Long-Term Goal #1: Hong will be able to identify and address any mood or anxiety challenges and will maintain a low level of anxiety and depression symptoms as evidenced by self-report. Short-Term Objectives: Objective 1A: Avis will be able to rate her mood and anxiety  Objective 1B: Pedro will  be able to identify any negative thinking patterns that are perpetuating anxiety or depressive symptoms and replace these with more adaptive thoughts.   Objective 1C: Alfrieda will be able to use appropriate coping strategies including helpful thoughts to maintain a low level of anxiety and depressive symptoms. Interventions: Cognitive Behavioral Therapy, Systems analyst, Motivational Interviewing, and Psycho-education/Bibliotherapy  coping skills and other evidenced-based practices will be used to promote progress towards healthy functioning and to help manage decrease symptoms associated with their diagnosis.  Treatment Regimen: Individual skill building sessions every 2-4 weeks to address treatment goal/objective Target Date: 04/2022 - target date moved to 07/2022 Responsible Party: therapist and patient Person delivering treatment: Licensed Psychologist [name], [name] will support the patient's ability to achieve the goals identified. Resolved:  No - Madalen has made some progress, but mood and anxiety continue to be variable - goal continued   Problem/Need: Life transition - Chaneta is in the process of completing a major life transition including graduating from high school, starting college, and potentially working. Long-Term Goal #2: Acie will successfully navigate these life transitions including being able to manage her stress level. Short-Term Objectives: Objective 2A: Obie will be able to identify stressors associated with the life transitions  Objective 2B: Pita will be able to identify strategies to help navigate stressors associated with the life transitions and implement them  Interventions: Assertiveness/Communication, Systems analyst, and Psycho-education/Bibliotherapy , and other evidenced-based practices will be used to promote progress towards healthy functioning and to help manage decrease symptoms associated with their diagnosis.  Treatment Regimen: Individual skill building sessions every 2-4 weeks to address treatment goal/objective Target Date: 04/2022 - target date moved to 07/2022 Responsible Party: therapist  and patient Person delivering treatment: Licensed Psychologist [name], [name] will support the patient's ability to achieve the goals identified. Resolved:  No - Special has made several transitions but still needs some support for this   Problem/Need: Life skills - Kariann needs to develop some additional skills to facilitate independent living. Long-Term Goal #3: Jerlyn will identify the additional life skills necessary to facilitate independent living. Short-Term Objectives: Objective 3A: Avia will be able to identify areas that she needs to address to increase the likelihood of her being able to successfully live independently.   Objective 3B: Navah will be able to create a plan to address weaknesses described in objective 3A.   Objective 3C: Macayle will be able to implement steps on the plan developed in objective 3B. Interventions: Motivational Interviewing, Solution-Oriented/Positive Psychology, and Psycho-education/Bibliotherapy  daily life skills, and other evidenced-based practices will be used to promote progress towards healthy functioning and to help manage decrease symptoms associated with their diagnosis.  Treatment Regimen: Individual skill building sessions every 2-4 weeks to address treatment goal/objective Target Date: 04/2022 - target date moved to 04/2023 Responsible Party: therapist and patient Person delivering treatment: Licensed Psychologist [name], [name] will support the patient's ability to achieve the goals identified. Resolved:  No - Ellee has made some strides towards identifying areas that need to be addressed to facilitate independent living and made some progress on developing those skills, but process continues   [name], [name]

## 2022-04-20 ENCOUNTER — Other Ambulatory Visit: Payer: Self-pay

## 2022-04-20 NOTE — Telephone Encounter (Signed)
You have not given refill of omeprazole in the past. Ok to refill?

## 2022-04-30 ENCOUNTER — Ambulatory Visit (INDEPENDENT_AMBULATORY_CARE_PROVIDER_SITE_OTHER): Admitting: Clinical

## 2022-04-30 DIAGNOSIS — F419 Anxiety disorder, unspecified: Secondary | ICD-10-CM | POA: Diagnosis not present

## 2022-04-30 DIAGNOSIS — F840 Autistic disorder: Secondary | ICD-10-CM

## 2022-04-30 NOTE — Progress Notes (Signed)
[facility] Counselor/Therapist Progress Note  Patient ID: Sarah Shah, MRN: [id number]    Date: 04/30/22  Time Spent: 10:10 am - 10:53 am: 43 Minutes  Type of Service Provided Individual Therapy  Type of Contact virtual (via [employer] with real time audio and visual interaction)  Patient Location: home       Provider Location: office  [name] participated from home, via video, and consented to treatment. Therapist participated from office.   Mental Status Exam: Appearance:  Casual     Behavior: Appropriate and Sharing  Motor: Normal  Speech/Language:  Clear and Coherent  Affect: Flat but not unusual for client  Mood: normal  Thought process: normal  Thought content:   WNL  Sensory/Perceptual disturbances:   WNL  Orientation: oriented to person, place, situation, and day of week  Attention: Fair to good  Concentration: Fair to good  Memory: WNL  Fund of knowledge:  Fair  Insight:   Fair  Judgment:  Fair  Impulse Control: Good   Risk Assessment:  Danger to Self:  No - denied SI Self-injurious Behavior: No - denied SIB Danger to Others: No Duty to Warn:no  Presenting Problems, Reported Symptoms, and /or Interim History: [name] presented for a session to address life stress and anxiety.   Subjective: [name] presented for an individual outpatient therapy session. The following was addressed during sessions.   [name] reported that things are going well overall. She is experiencing some stress related to school. She described her mood and anxiety as neutral, minimal anger, but some frustration (related to school and the recent need to be patient about waiting for her mother to complete some paperwork). Strategies she can use to manage her stress and frustration were discussed.   [name] also reported that she has been overspending.  She has been buying things from [facility] influencers that she follows. Plans to try to reduce this were discussed (e.g.,  taking a break before adding items to the cart, writing down what she buys and how much it cost every time, etc) as well as general money management strategies.    Interventions/Psychotherapy Techniques Used During Session: Cognitive Behavioral Therapy, Solution-Oriented/Positive Psychology, and life skills training  Diagnosis: Autism spectrum disorder  Anxiety  MENTAL HEALTH INTERVENTIONS USED DURING TREATMENT & PATIENT'S RESPONSE TO INTERVENTIONS:  Short-term Objective addressed today:Yaremi will be able to identify areas that she needs to address to increase the likelihood of her being able to successfully live independently.  AND Ezella will be able to create a plan to address weaknesses described in objective 3A.   Mental health techniques used: Objective was addressed in session through the use of Solution-Oriented/Positive Psychology and life skills training and discussion. [name]'s response was positive (discussion related to money management).  Progress Toward Goal: progressing   Short-term Objective addressed today: [name] will be able to use appropriate coping strategies including helpful thoughts to maintain a low level of anxiety and depressive symptoms. Mental health techniques used: Objective was addressed in session through the use of Cognitive Behavioral Therapy and discussion. [name]'s response was positive.  Progress Toward Goal: progressing    PLAN  1. [name] will return for a therapy session.   2. Homework Given:  start implementing money management strategies, use anxiety management strategies to help manage anxiety and stress as the semester ends. This homework will be reviewed with [name] at the next visit.  3. During the next session check in on stress and anxiety.     [name]  Owens Loffler, [name]  Individual Treatment Plan  - please see the notes from 04/17/2021 & 05/01/2021 for complete treatment plan information.  Problem/Need: [name] has a history of anxiety and mood concerns - with  some ongoing challenges in these areas  Long-Term Goal #1: Winston will be able to identify and address any mood or anxiety challenges and will maintain a low level of anxiety and depression symptoms as evidenced by self-report. Short-Term Objectives: Objective 1A: [name] will be able to rate her mood and anxiety  Objective 1B: [name] will be able to identify any negative thinking patterns that are perpetuating anxiety or depressive symptoms and replace these with more adaptive thoughts.  Objective 1C: [name] will be able to use appropriate coping strategies including helpful thoughts to maintain a low level of anxiety and depressive symptoms. Interventions: Cognitive Behavioral Therapy, Psychologist, occupational, Motivational Interviewing, and Psycho-education/Bibliotherapy  coping skills and other evidenced-based practices will be used to promote progress towards healthy functioning and to help manage decrease symptoms associated with their diagnosis.  Treatment Regimen: Individual skill building sessions every 2-4 weeks to address treatment goal/objective Target Date: 04/2022 - target date moved to 07/2022 Responsible Party: therapist and patient Person delivering treatment: Licensed Psychologist [name], [name] will support the patient's ability to achieve the goals identified. Resolved:  No - [name] has made some progress, but mood and anxiety continue to be variable - goal continued   Problem/Need: Life transition - Bracha is in the process of completing a major life transition including graduating from high school, starting college, and potentially working. Long-Term Goal #2: [name] will successfully navigate these life transitions including being able to manage her stress level. Short-Term Objectives: Objective 2A: [name] will be able to identify stressors associated with the life transitions  Objective 2B: [name] will be able to identify strategies to help navigate stressors associated with the life  transitions and implement them  Interventions: Assertiveness/Communication, Psychologist, occupational, and Psycho-education/Bibliotherapy , and other evidenced-based practices will be used to promote progress towards healthy functioning and to help manage decrease symptoms associated with their diagnosis.  Treatment Regimen: Individual skill building sessions every 2-4 weeks to address treatment goal/objective Target Date: 04/2022 - target date moved to 07/2022 Responsible Party: therapist and patient Person delivering treatment: Licensed Psychologist [name], [name] will support the patient's ability to achieve the goals identified. Resolved:  No - Harshita has made several transitions but still needs some support for this   Problem/Need: Life skills - Dian needs to develop some additional skills to facilitate independent living. Long-Term Goal #3: Albertha will identify the additional life skills necessary to facilitate independent living. Short-Term Objectives: Objective 3A: Violia will be able to identify areas that she needs to address to increase the likelihood of her being able to successfully live independently.   Objective 3B: Bobbe will be able to create a plan to address weaknesses described in objective 3A.   Objective 3C: Sakshi will be able to implement steps on the plan developed in objective 3B. Interventions: Motivational Interviewing, Solution-Oriented/Positive Psychology, and Psycho-education/Bibliotherapy  daily life skills, and other evidenced-based practices will be used to promote progress towards healthy functioning and to help manage decrease symptoms associated with their diagnosis.  Treatment Regimen: Individual skill building sessions every 2-4 weeks to address treatment goal/objective Target Date: 04/2022 - target date moved to 04/2023 Responsible Party: therapist and patient Person delivering treatment: Licensed Psychologist [name], [name] will support the patient's ability  to achieve the goals identified. Resolved:  No - Glenette has made some strides towards identifying areas that need to be addressed to facilitate independent living and made some progress on developing those skills, but process continues   [name], [name]

## 2022-05-13 ENCOUNTER — Ambulatory Visit: Admitting: Nurse Practitioner

## 2022-05-13 ENCOUNTER — Telehealth: Payer: Self-pay | Admitting: Nurse Practitioner

## 2022-05-13 NOTE — Telephone Encounter (Signed)
Patients mother called stating she was able to make this morning's appointment due to some complications. I have scheduled her for the next available appointment in July. She is wondering if there is something that the patient could take for her GERD in the mean time. Would like to discuss this. Please advise, thank you.

## 2022-05-13 NOTE — Progress Notes (Deleted)
Assessment   Primary Gi: Yancey Flemings, MD   Brief Narrative:  22 y.o. yo female with a past medical history of, but not limited to chronic abdominal pain, GERD, obesity, PCOS, ADD, autism, anxiety, depression   GERD without Barrett's     Plan        History of Present Illness   Chief complaint:    Joleah has been folowed here for abdominal pain GERD and abnormal liver chemistries. Last seen 12/24/21 for GERD symptoms. Refer to that office note for detail. In summary, plan was, if symptoms persisted, to consider a 24 hr pH study to try and correlate symptoms with acid reflux   Interval History:        Procedure risk assessment:  No history of CHF.  No supplemental 02 use at home.  Not a known difficult airway Anticoagulant:      Previous GI Evaluation   EGD Oct 2023 [place] normal.    Imaging        Labs:       Latest Ref Rng & Units 04/10/2022    5:16 PM 03/05/2022   10:54 PM 01/15/2022    2:27 PM  Hepatic Function  Total Protein 6.5 - 8.1 g/dL 7.9  7.3  7.2   Albumin 3.5 - 5.0 g/dL 3.9  3.8  4.2   AST 15 - 41 U/L 19  24  11    ALT 0 - 44 U/L 20  35  10   Alk Phosphatase 38 - 126 U/L 102  91  100   Total Bilirubin 0.3 - 1.2 mg/dL 0.4  0.5  0.2        Latest Ref Rng & Units 04/10/2022    5:16 PM 03/05/2022    8:39 PM 01/15/2022    2:27 PM  CBC  WBC 4.0 - 10.5 K/uL 7.7  7.6  7.5   Hemoglobin 12.0 - 15.0 g/dL 16.1  09.6  04.5   Hematocrit 36.0 - 46.0 % 42.9  44.8  42.7   Platelets 150 - 400 K/uL 348  301  314              Past Medical History:  Diagnosis Date   ADHD (attention deficit hyperactivity disorder)    Anemia    Anxiety    Autism spectrum    Constipated    Constipation    Depression    Development delay    GERD (gastroesophageal reflux disease)    Headache    Insulin resistance    Iron deficiency    Learning disability    Obesity    ODD (oppositional defiant disorder)    PCOS (polycystic ovarian syndrome)    PMDD  (premenstrual dysphoric disorder)    Pneumonia    3 mos old and 35 mos old   Visual acuity reduced    glasses    Past Surgical History:  Procedure Laterality Date   BIOPSY  02/17/2021   Procedure: BIOPSY;  Surgeon: Hilarie Fredrickson, MD;  Location: [facility];  Service: Endoscopy;;   COLONOSCOPY WITH PROPOFOL N/A 02/17/2021   Procedure: COLONOSCOPY WITH PROPOFOL;  Surgeon: Hilarie Fredrickson, MD;  Location: [facility];  Service: Endoscopy;  Laterality: N/A;   ESOPHAGOGASTRODUODENOSCOPY (EGD) WITH PROPOFOL N/A 02/17/2021   Procedure: ESOPHAGOGASTRODUODENOSCOPY (EGD) WITH PROPOFOL;  Surgeon: Hilarie Fredrickson, MD;  Location: [facility];  Service: Endoscopy;  Laterality: N/A;   TOOTH EXTRACTION N/A 01/03/2018   Procedure: SURGICAL EXTRACTION OF TEETH #1, 16, 17, 32;  Surgeon: Vivia Ewing, DMD;  Location: [facility] OR;  Service: Oral Surgery;  Laterality: N/A;    Current Medications, Allergies, Family History and Social History were reviewed in [employer] record.     Current Outpatient Medications  Medication Sig Dispense Refill   amphetamine-dextroamphetamine (ADDERALL XR) 30 MG 24 hr capsule Take 30 mg by mouth every morning.     busPIRone (BUSPAR) 30 MG tablet Take 30 mg by mouth 2 (two) times daily.     cloNIDine HCl (KAPVAY) 0.1 MG TB12 ER tablet Take 2 tablets (0.2 mg total) by mouth 2 (two) times daily. Take 2 tablets by mouth 2 times daily (Patient taking differently: Take 0.2 mg by mouth 2 (two) times daily.) 360 tablet 1   desogestrel-ethinyl estradiol (VIORELE) 0.15-0.02/0.01 MG (21/5) tablet Take 1 tablet by mouth daily. Please give active pills only. Skip placebo pills. 140 tablet 3   dicyclomine (BENTYL) 20 MG tablet Take 1 tablet (20 mg total) by mouth every 6 (six) hours. (Patient taking differently: Take 20 mg by mouth every 6 (six) hours as needed (stomach pain).) 60 tablet 0   docusate sodium (COLACE) 100 MG capsule Take 100 mg by mouth at bedtime.     ferrous sulfate  325 (65 FE) MG tablet Take 325 mg by mouth at bedtime.     FLUoxetine (PROZAC) 40 MG capsule Take 1 capsule (40 mg total) by mouth daily.     mupirocin ointment (BACTROBAN) 2 % Apply 1 Application topically 2 (two) times daily. Please apply the antibiotic ointment to the wound twice a day. 22 g 0   omeprazole (PRILOSEC) 40 MG capsule Take 40 mg by mouth daily.     ondansetron (ZOFRAN-ODT) 4 MG disintegrating tablet Take 1 tablet (4 mg total) by mouth every 8 (eight) hours as needed for nausea or vomiting. 12 tablet 0   tirzepatide (MOUNJARO) 5 MG/0.5ML Pen INJECT 5MG  INTO THE SKIN ONCE A WEEK 2 mL 5   Topiramate ER (TROKENDI XR) 50 MG CP24 Take 1 capsule by mouth daily. 30 capsule 1   vitamin C (ASCORBIC ACID) 500 MG tablet Take 500 mg by mouth at bedtime.     Vitamin D, Ergocalciferol, (DRISDOL) 1.25 MG (50000 UNIT) CAPS capsule Take 50,000 Units by mouth once a week.     No current facility-administered medications for this visit.    Review of Systems: No chest pain. No shortness of breath. No urinary complaints.    Physical Exam  Wt Readings from Last 3 Encounters:  04/10/22 (!) 304 lb (137.9 kg)  03/31/22 (!) 304 lb (137.9 kg)  03/18/22 (!) 307 lb 15.7 oz (139.7 kg)    There were no vitals taken for this visit. Constitutional:  Pleasant, generally well appearing ***female in no acute distress. Psychiatric: Normal mood and affect. Behavior is normal. EENT: Pupils normal.  Conjunctivae are normal. No scleral icterus. Neck supple.  Cardiovascular: Normal rate, regular rhythm.  Pulmonary/chest: Effort normal and breath sounds normal. No wheezing, rales or rhonchi. Abdominal: Soft, nondistended, nontender. Bowel sounds active throughout. There are no masses palpable. No hepatomegaly. Neurological: Alert and oriented to person place and time.  Extremities: *** edema Skin: Skin is warm and dry. No rashes noted.  Willette Cluster, NP  05/13/2022, 8:06 AM  Cc:  Mort Sawyers,  FNP

## 2022-05-13 NOTE — Progress Notes (Deleted)
Assessment   Primary Gi:  22 y.o. yo female with the following:       Plan        History of Present Illness   Chief complaint:    Sarah Shah is a 22 y.o. female with a past medical history of ***. See PMH / PSH for additional details.    Interval History:        Procedure risk assessment:  No history of CHF.  No supplemental 02 use at home.  Not a known difficult airway Anticoagulant:      Previous GI Evaluation       Imaging        Labs:       Latest Ref Rng & Units 04/10/2022    5:16 PM 03/05/2022   10:54 PM 01/15/2022    2:27 PM  Hepatic Function  Total Protein 6.5 - 8.1 g/dL 7.9  7.3  7.2   Albumin 3.5 - 5.0 g/dL 3.9  3.8  4.2   AST 15 - 41 U/L 19  24  11    ALT 0 - 44 U/L 20  35  10   Alk Phosphatase 38 - 126 U/L 102  91  100   Total Bilirubin 0.3 - 1.2 mg/dL 0.4  0.5  0.2        Latest Ref Rng & Units 04/10/2022    5:16 PM 03/05/2022    8:39 PM 01/15/2022    2:27 PM  CBC  WBC 4.0 - 10.5 K/uL 7.7  7.6  7.5   Hemoglobin 12.0 - 15.0 g/dL 16.1  09.6  04.5   Hematocrit 36.0 - 46.0 % 42.9  44.8  42.7   Platelets 150 - 400 K/uL 348  301  314              Past Medical History:  Diagnosis Date   ADHD (attention deficit hyperactivity disorder)    Anemia    Anxiety    Autism spectrum    Constipated    Constipation    Depression    Development delay    GERD (gastroesophageal reflux disease)    Headache    Insulin resistance    Iron deficiency    Learning disability    Obesity    ODD (oppositional defiant disorder)    PCOS (polycystic ovarian syndrome)    PMDD (premenstrual dysphoric disorder)    Pneumonia    3 mos old and 24 mos old   Visual acuity reduced    glasses    Past Surgical History:  Procedure Laterality Date   BIOPSY  02/17/2021   Procedure: BIOPSY;  Surgeon: Hilarie Fredrickson, MD;  Location: [facility];  Service: Endoscopy;;   COLONOSCOPY WITH PROPOFOL N/A 02/17/2021   Procedure: COLONOSCOPY  WITH PROPOFOL;  Surgeon: Hilarie Fredrickson, MD;  Location: [facility];  Service: Endoscopy;  Laterality: N/A;   ESOPHAGOGASTRODUODENOSCOPY (EGD) WITH PROPOFOL N/A 02/17/2021   Procedure: ESOPHAGOGASTRODUODENOSCOPY (EGD) WITH PROPOFOL;  Surgeon: Hilarie Fredrickson, MD;  Location: [facility];  Service: Endoscopy;  Laterality: N/A;   TOOTH EXTRACTION N/A 01/03/2018   Procedure: SURGICAL EXTRACTION OF TEETH #1, 16, 17, 32;  Surgeon: Vivia Ewing, DMD;  Location: [facility];  Service: Oral Surgery;  Laterality: N/A;    Current Medications, Allergies, Family History and Social History were reviewed in [employer] record.     Current Outpatient Medications  Medication Sig Dispense Refill   amphetamine-dextroamphetamine (ADDERALL XR) 30 MG 24 hr capsule  Take 30 mg by mouth every morning.     busPIRone (BUSPAR) 30 MG tablet Take 30 mg by mouth 2 (two) times daily.     cloNIDine HCl (KAPVAY) 0.1 MG TB12 ER tablet Take 2 tablets (0.2 mg total) by mouth 2 (two) times daily. Take 2 tablets by mouth 2 times daily (Patient taking differently: Take 0.2 mg by mouth 2 (two) times daily.) 360 tablet 1   desogestrel-ethinyl estradiol (VIORELE) 0.15-0.02/0.01 MG (21/5) tablet Take 1 tablet by mouth daily. Please give active pills only. Skip placebo pills. 140 tablet 3   dicyclomine (BENTYL) 20 MG tablet Take 1 tablet (20 mg total) by mouth every 6 (six) hours. (Patient taking differently: Take 20 mg by mouth every 6 (six) hours as needed (stomach pain).) 60 tablet 0   docusate sodium (COLACE) 100 MG capsule Take 100 mg by mouth at bedtime.     ferrous sulfate 325 (65 FE) MG tablet Take 325 mg by mouth at bedtime.     FLUoxetine (PROZAC) 40 MG capsule Take 1 capsule (40 mg total) by mouth daily.     mupirocin ointment (BACTROBAN) 2 % Apply 1 Application topically 2 (two) times daily. Please apply the antibiotic ointment to the wound twice a day. 22 g 0   omeprazole (PRILOSEC) 40 MG capsule Take 40 mg by  mouth daily.     ondansetron (ZOFRAN-ODT) 4 MG disintegrating tablet Take 1 tablet (4 mg total) by mouth every 8 (eight) hours as needed for nausea or vomiting. 12 tablet 0   tirzepatide (MOUNJARO) 5 MG/0.5ML Pen INJECT 5MG  INTO THE SKIN ONCE A WEEK 2 mL 5   Topiramate ER (TROKENDI XR) 50 MG CP24 Take 1 capsule by mouth daily. 30 capsule 1   vitamin C (ASCORBIC ACID) 500 MG tablet Take 500 mg by mouth at bedtime.     Vitamin D, Ergocalciferol, (DRISDOL) 1.25 MG (50000 UNIT) CAPS capsule Take 50,000 Units by mouth once a week.     No current facility-administered medications for this visit.    Review of Systems: No chest pain. No shortness of breath. No urinary complaints.    Physical Exam  Wt Readings from Last 3 Encounters:  04/10/22 (!) 304 lb (137.9 kg)  03/31/22 (!) 304 lb (137.9 kg)  03/18/22 (!) 307 lb 15.7 oz (139.7 kg)    There were no vitals taken for this visit. Constitutional:  Pleasant, generally well appearing ***female in no acute distress. Psychiatric: Normal mood and affect. Behavior is normal. EENT: Pupils normal.  Conjunctivae are normal. No scleral icterus. Neck supple.  Cardiovascular: Normal rate, regular rhythm.  Pulmonary/chest: Effort normal and breath sounds normal. No wheezing, rales or rhonchi. Abdominal: Soft, nondistended, nontender. Bowel sounds active throughout. There are no masses palpable. No hepatomegaly. Neurological: Alert and oriented to person place and time.  Extremities: *** edema Skin: Skin is warm and dry. No rashes noted.  Willette Cluster, NP  05/13/2022, 8:04 AM  Cc:  Mort Sawyers, FNP

## 2022-05-14 MED ORDER — OMEPRAZOLE 40 MG PO CPDR: 40.0000 mg | DELAYED_RELEASE_CAPSULE | Freq: Every day | ORAL | 3 refills | Status: DC

## 2022-05-14 NOTE — Telephone Encounter (Signed)
The pt has an appt scheduled but per the mother she is asking for a refill of omeprazole to last until she can be seen. That has been refilled.  The mother has been advised.

## 2022-05-19 ENCOUNTER — Ambulatory Visit (INDEPENDENT_AMBULATORY_CARE_PROVIDER_SITE_OTHER): Admitting: Clinical

## 2022-05-19 DIAGNOSIS — F840 Autistic disorder: Secondary | ICD-10-CM

## 2022-05-19 DIAGNOSIS — F419 Anxiety disorder, unspecified: Secondary | ICD-10-CM

## 2022-05-19 DIAGNOSIS — F32A Depression, unspecified: Secondary | ICD-10-CM | POA: Diagnosis not present

## 2022-05-19 NOTE — Progress Notes (Signed)
[facility] Counselor/Therapist Progress Note  Patient ID: Sarah Shah, MRN: [id number]    Date: 05/19/22  Time Spent: 10:17 am - 10:59 am: 41 Minutes  Type of Service Provided Individual Therapy  Type of Contact virtual (via [employer] with real time audio and visual interaction)  Patient Location: home       Provider Location: office  Sarah Shah participated from home, via video, and consented to treatment. Therapist participated from office.   Mental Status Exam: Appearance:  Casual     Behavior: Appropriate  Motor: Normal  Speech/Language:  Clear and Coherent and Normal Rate  Affect: Flat, but not out of the ordinary for client   Mood: normal a bit sad  Thought process: normal  Thought content:   WNL  Sensory/Perceptual disturbances:   WNL  Orientation: oriented to person, place, time/date, and situation  Attention: Fair to good  Concentration: Fair to good   Memory: WNL  Fund of knowledge:  Good  Insight:   Fair  Judgment:  Fair  Impulse Control: Fair   Risk Assessment:  Danger to Self:  No - denied SI Self-injurious Behavior: No - denied SIB Danger to Others: No Duty to Warn:no  Presenting Problems, Reported Symptoms, and /or Interim History: Sarah Shah presented for a session to address life stress and mood concerns.   Subjective: Sarah Shah presented for an individual outpatient therapy session. The following was addressed during sessions.   Sindy rated her mood as a 4-5 out of 10 (with 1 being sad, 5 being neutral, and 10 being happy). Sarah Shah rated her anxiety as a 5 out of 10 (with 10 being high). Sarah Shah reported some stressors at home but did not want to share much information about them. Anxiety related to school and eating were discussed. Sarah Shah reported increased "blah" feelings and expressed some concerns that she is experiencing an increase in depression symptoms. She has been eating more and been a bit down. Changes to sleep, crying more than  usual, feelings of hopelessness, and loss of pleasure in formally pleasurable activities were denied.  Careful monitoring for additional symptoms of depression were discussed along with strategies to try to address current mood and anxiety challenges. Sarah Shah has been using the money management plan discussed last time and reported that it has been helpful. Long term plans/strategies to help increase activation and mood were discussed.     Interventions/Psychotherapy Techniques Used During Session: Cognitive Behavioral Therapy, executive functioning and life skills strategies   Diagnosis: Depression, unspecified depression type  Autism spectrum disorder  Anxiety  MENTAL HEALTH INTERVENTIONS USED DURING TREATMENT & PATIENT'S RESPONSE TO INTERVENTIONS:  Short-term Objective addressed today:Sarah Shah will be able to use appropriate coping strategies including helpful thoughts to maintain a low level of anxiety and depressive symptoms. Mental health techniques used: Objective was addressed in session through the use of Cognitive Behavioral Therapy and discussion. Sarah Shah's response was positive. Progress Toward Goal: progressing   Short-term Objective addressed today:Sarah Shah will be able to create a plan to address weaknesses described in objective 3A.  AND  Sarah Shah will be able to implement steps on the plan developed in objective 3B. Mental health techniques used: Objective was addressed in session through the use of  executive functioning and life skills , and discussion. Sarah Shah's response was positive. Progress Toward Goal: progressing     PLAN  1. Sarah Shah will return for a therapy session.   2. Homework Given:  go somewhere to study, continue to engage in stress management strategies,  continue with money management plan. This homework will be reviewed with Sarah Shah at the next visit.  3. During the next session check in on mood and anxiety.     [name], [name]  Individual Treatment Plan  - please see the  notes from 04/17/2021 & 05/01/2021 for complete treatment plan information. - Updated Winter 2024  Problem/Need: Sarah Shah has a history of anxiety and mood concerns - with some ongoing challenges in these areas  Long-Term Goal #1: Sarah Shah will be able to identify and address any mood or anxiety challenges and will maintain a low level of anxiety and depression symptoms as evidenced by self-report. Short-Term Objectives: Objective 1A: Sarah Shah will be able to rate her mood and anxiety  Objective 1B: Sarah Shah will be able to identify any negative thinking patterns that are perpetuating anxiety or depressive symptoms and replace these with more adaptive thoughts.  Objective 1C: Sarah Shah will be able to use appropriate coping strategies including helpful thoughts to maintain a low level of anxiety and depressive symptoms. Interventions: Cognitive Behavioral Therapy, Psychologist, occupational, Motivational Interviewing, and Psycho-education/Bibliotherapy  coping skills and other evidenced-based practices will be used to promote progress towards healthy functioning and to help manage decrease symptoms associated with their diagnosis.  Treatment Regimen: Individual skill building sessions every 2-4 weeks to address treatment goal/objective Target Date: 04/2022 - target date moved to 07/2022 Responsible Party: therapist and patient Person delivering treatment: Licensed Psychologist [name], [name] will support the patient's ability to achieve the goals identified. Resolved:  No - Sarah Shah has made some progress, but mood and anxiety continue to be variable - goal continued   Problem/Need: Life transition - Sarah Shah is in the process of completing a major life transition including graduating from high school, starting college, and potentially working. Long-Term Goal #2: Sarah Shah will successfully navigate these life transitions including being able to manage her stress level. Short-Term Objectives: Objective 2A: Sarah Shah will be able to  identify stressors associated with the life transitions  Objective 2B: Sarah Shah will be able to identify strategies to help navigate stressors associated with the life transitions and implement them  Interventions: Assertiveness/Communication, Psychologist, occupational, and Psycho-education/Bibliotherapy , and other evidenced-based practices will be used to promote progress towards healthy functioning and to help manage decrease symptoms associated with their diagnosis.  Treatment Regimen: Individual skill building sessions every 2-4 weeks to address treatment goal/objective Target Date: 04/2022 - target date moved to 07/2022 Responsible Party: therapist and patient Person delivering treatment: Licensed Psychologist [name], [name] will support the patient's ability to achieve the goals identified. Resolved:  No - Sarah Shah has made several transitions but still needs some support for this   Problem/Need: Life skills - [place] needs to develop some additional skills to facilitate independent living. Long-Term Goal #3: Sarah Shah will identify the additional life skills necessary to facilitate independent living. Short-Term Objectives: Objective 3A: Sarah Shah will be able to identify areas that she needs to address to increase the likelihood of her being able to successfully live independently.   Objective 3B: Sarah Shah will be able to create a plan to address weaknesses described in objective 3A.   Objective 3C: Riddhi will be able to implement steps on the plan developed in objective 3B. Interventions: Motivational Interviewing, Solution-Oriented/Positive Psychology, and Psycho-education/Bibliotherapy  daily life skills, and other evidenced-based practices will be used to promote progress towards healthy functioning and to help manage decrease symptoms associated with their diagnosis.  Treatment Regimen: Individual skill building sessions every 2-4 weeks to address  treatment goal/objective Target Date: 04/2022 - target date  moved to 04/2023 Responsible Party: therapist and patient Person delivering treatment: Licensed Psychologist [name], [name] will support the patient's ability to achieve the goals identified. Resolved:  No - Ashly has made some strides towards identifying areas that need to be addressed to facilitate independent living and made some progress on developing those skills, but process continues   [name], [name]

## 2022-05-27 ENCOUNTER — Encounter (INDEPENDENT_AMBULATORY_CARE_PROVIDER_SITE_OTHER): Payer: Self-pay | Admitting: Pediatric Endocrinology

## 2022-05-27 ENCOUNTER — Encounter: Payer: Self-pay | Admitting: Physician Assistant

## 2022-05-27 NOTE — Addendum Note (Signed)
Addended by: Sharolyn Douglas on: 05/27/2022 03:34 PM   Modules accepted: Orders

## 2022-05-28 ENCOUNTER — Ambulatory Visit (INDEPENDENT_AMBULATORY_CARE_PROVIDER_SITE_OTHER): Admitting: Family

## 2022-05-28 VITALS — BP 126/76 | HR 98 | Temp 97.6°F | Ht 65.0 in | Wt 323.2 lb

## 2022-05-28 DIAGNOSIS — R220 Localized swelling, mass and lump, head: Secondary | ICD-10-CM | POA: Insufficient documentation

## 2022-05-28 DIAGNOSIS — E8881 Metabolic syndrome: Secondary | ICD-10-CM | POA: Insufficient documentation

## 2022-05-28 DIAGNOSIS — Z6841 Body Mass Index (BMI) 40.0 and over, adult: Secondary | ICD-10-CM

## 2022-05-28 LAB — LIPID PANEL
Cholesterol: 144 mg/dL (ref 0–200)
HDL: 49 mg/dL (ref >39.00)
LDL Cholesterol: 67 mg/dL (ref 0–99)
NonHDL: 94.71
Total CHOL/HDL Ratio: 3
Triglycerides: 138 mg/dL (ref 0.0–149.0)
VLDL: 27.6 mg/dL (ref 0.0–40.0)

## 2022-05-28 LAB — CBC WITH DIFFERENTIAL/PLATELET
Basophils Absolute: 0.1 10*3/uL (ref 0.0–0.1)
Basophils Relative: 0.6 % (ref 0.0–3.0)
Eosinophils Absolute: 0.1 10*3/uL (ref 0.0–0.7)
Eosinophils Relative: 1.6 % (ref 0.0–5.0)
HCT: 40.5 % (ref 36.0–46.0)
Hemoglobin: 13.9 g/dL (ref 12.0–15.0)
Lymphocytes Relative: 22.5 % (ref 12.0–46.0)
Lymphs Abs: 1.9 10*3/uL (ref 0.7–4.0)
MCHC: 34.3 g/dL (ref 30.0–36.0)
MCV: 87.4 fl (ref 78.0–100.0)
Monocytes Absolute: 0.7 10*3/uL (ref 0.1–1.0)
Monocytes Relative: 7.9 % (ref 3.0–12.0)
Neutro Abs: 5.6 10*3/uL (ref 1.4–7.7)
Neutrophils Relative %: 67.4 % (ref 43.0–77.0)
Platelets: 296 10*3/uL (ref 150.0–400.0)
RBC: 4.63 Mil/uL (ref 3.87–5.11)
RDW: 12.5 % (ref 11.5–14.6)
WBC: 8.3 10*3/uL (ref 4.5–10.5)

## 2022-05-28 LAB — COMPREHENSIVE METABOLIC PANEL
ALT: 12 U/L (ref 0–35)
AST: 12 U/L (ref 0–37)
Albumin: 3.6 g/dL (ref 3.5–5.2)
Alkaline Phosphatase: 81 U/L (ref 39–117)
BUN: 15 mg/dL (ref 6–23)
CO2: 24 mEq/L (ref 19–32)
Calcium: 9.4 mg/dL (ref 8.4–10.5)
Chloride: 105 mEq/L (ref 96–112)
Creatinine, Ser: 0.6 mg/dL (ref 0.40–1.20)
GFR: 128.99 mL/min (ref >60.00)
Glucose, Bld: 94 mg/dL (ref 70–99)
Potassium: 3.9 mEq/L (ref 3.5–5.1)
Sodium: 139 mEq/L (ref 135–145)
Total Bilirubin: 0.2 mg/dL (ref 0.2–1.2)
Total Protein: 6.5 g/dL (ref 6.0–8.3)

## 2022-05-28 LAB — T4, FREE: Free T4: 0.59 ng/dL — ABNORMAL LOW (ref 0.60–1.60)

## 2022-05-28 LAB — TSH: TSH: 2.73 u[IU]/mL (ref 0.35–5.50)

## 2022-05-28 NOTE — Patient Instructions (Addendum)
I have ordered imaging for you at [facility] outpatient diagnostic center for ultrasound head/neck for mass. This order has been sent over for you electronically.   Please call [phone] to schedule this appointment.  Stop by the lab prior to leaving today. I will notify you of your results once received.   Regards,   Mort Sawyers FNP-C

## 2022-05-28 NOTE — Progress Notes (Signed)
Established Patient Office Visit  Subjective:      CC:  Chief Complaint  Patient presents with   Head Injury    Knot on head    HPI: Sarah Shah is a 22 y.o. female presenting on 05/28/2022 for Head Injury (Knot on head) . Accompanied by her mom today.   New complaints: Mom states that she has noticed a knot on the back of her head, mom was doing her hair and noticed under the skin under the knot area she noticed a bunch of white little bumps. Mom states knot has been there for for a few days to a week.   No recent fall.   Did have h/o cholecystectomy with concern for wound infection. She did f/u with Dr. Claudine Mouton 03/31/22. Doing well now. No concerns in regards to this.    Social history:  Relevant past medical, surgical, family and social history reviewed and updated as indicated. Interim medical history since our last visit reviewed.  Allergies and medications reviewed and updated.  DATA REVIEWED: CHART IN EPIC     ROS: Negative unless specifically indicated above in HPI.    Current Outpatient Medications:    amphetamine-dextroamphetamine (ADDERALL XR) 30 MG 24 hr capsule, Take 30 mg by mouth every morning., Disp: , Rfl:    busPIRone (BUSPAR) 30 MG tablet, Take 30 mg by mouth 2 (two) times daily., Disp: , Rfl:    cloNIDine HCl (KAPVAY) 0.1 MG TB12 ER tablet, Take 2 tablets (0.2 mg total) by mouth 2 (two) times daily. Take 2 tablets by mouth 2 times daily (Patient taking differently: Take 0.2 mg by mouth 2 (two) times daily.), Disp: 360 tablet, Rfl: 1   desogestrel-ethinyl estradiol (VIORELE) 0.15-0.02/0.01 MG (21/5) tablet, Take 1 tablet by mouth daily. Please give active pills only. Skip placebo pills., Disp: 140 tablet, Rfl: 3   dicyclomine (BENTYL) 20 MG tablet, Take 1 tablet (20 mg total) by mouth every 6 (six) hours. (Patient taking differently: Take 20 mg by mouth every 6 (six) hours as needed (stomach pain).), Disp: 60 tablet, Rfl: 0   docusate sodium  (COLACE) 100 MG capsule, Take 100 mg by mouth at bedtime., Disp: , Rfl:    ferrous sulfate 325 (65 FE) MG tablet, Take 325 mg by mouth at bedtime., Disp: , Rfl:    FLUoxetine (PROZAC) 40 MG capsule, Take 1 capsule (40 mg total) by mouth daily., Disp: , Rfl:    omeprazole (PRILOSEC) 40 MG capsule, Take 1 capsule (40 mg total) by mouth daily., Disp: 30 capsule, Rfl: 3   Topiramate ER (TROKENDI XR) 50 MG CP24, Take 1 capsule by mouth daily., Disp: 30 capsule, Rfl: 1   vitamin C (ASCORBIC ACID) 500 MG tablet, Take 500 mg by mouth at bedtime., Disp: , Rfl:       Objective:    BP 126/76   Pulse 98   Temp 97.6 F (36.4 C) (Temporal)   Ht 5\' 5"  (1.651 m)   Wt (!) 323 lb 3.2 oz (146.6 kg)   SpO2 99%   BMI 53.78 kg/m   Wt Readings from Last 3 Encounters:  05/28/22 (!) 323 lb 3.2 oz (146.6 kg)  04/10/22 (!) 304 lb (137.9 kg)  03/31/22 (!) 304 lb (137.9 kg)    Physical Exam HENT:     Head:     Comments: Mildly soft mass on left posterior scalp with slight prominence over right side, spongy  Psychiatric:        Mood and  Affect: Mood is depressed. Affect is flat.        Speech: Speech normal.        Cognition and Memory: Memory normal.        Judgment: Judgment normal.           Assessment & Plan:  Mass of scalp Assessment & Plan: Low suspicion of concern however will assess with u/s soft tissue to r/o vascularity  Suspected occipital lymph node vs lipoma   Orders: -     US SOFT TISSUE HEAD & NECK (NON-THYROID); Future -     TSH -     T4, free  Morbid obesity (HCC) Assessment & Plan: Pt advised to work on diet and exercise as tolerated   Orders: -     C-peptide -     Lipid panel  Metabolic syndrome Assessment & Plan: Ordering lab work for endocrinologist pending results.   Orders: -     Comprehensive metabolic panel -     CBC with Differential/Platelet -     C-peptide -     Lipid panel     Return in about 1 year (around 05/28/2023) for f/u CPE.  Mort Sawyers, MSN, APRN, FNP-C [name]

## 2022-05-28 NOTE — Assessment & Plan Note (Signed)
Ordering lab work for endocrinologist pending results.

## 2022-05-28 NOTE — Assessment & Plan Note (Signed)
Pt advised to work on diet and exercise as tolerated  

## 2022-05-28 NOTE — Assessment & Plan Note (Signed)
Low suspicion of concern however will assess with u/s soft tissue to r/o vascularity  Suspected occipital lymph node vs lipoma

## 2022-05-28 NOTE — Progress Notes (Signed)
knot 

## 2022-05-29 ENCOUNTER — Encounter: Payer: Self-pay | Admitting: Physician Assistant

## 2022-05-29 LAB — C-PEPTIDE: C-Peptide: 5.71 ng/mL — ABNORMAL HIGH (ref 0.80–3.85)

## 2022-06-01 ENCOUNTER — Encounter: Payer: Self-pay | Admitting: Family

## 2022-06-01 NOTE — Progress Notes (Signed)
Good afternoon Dr. Vanessa [place],  [name] and her mom had asked if I could order the lab work that you had ordered in our office during their visit with me, so I wanted to send you along the results.

## 2022-06-01 NOTE — Progress Notes (Signed)
Thank you! Mom let me know that they were ordered and I already sent her a reply about them!  Teamwork for the win! Dr. Vanessa [place]

## 2022-06-02 ENCOUNTER — Ambulatory Visit
Admission: RE | Admit: 2022-06-02 | Discharge: 2022-06-02 | Disposition: A | Discharge status: Home / Self Care | Source: Ambulatory Visit | Attending: Family | Admitting: Family

## 2022-06-02 ENCOUNTER — Encounter: Payer: Self-pay | Admitting: Physician Assistant

## 2022-06-02 ENCOUNTER — Ambulatory Visit (INDEPENDENT_AMBULATORY_CARE_PROVIDER_SITE_OTHER): Admitting: Clinical

## 2022-06-02 DIAGNOSIS — F840 Autistic disorder: Secondary | ICD-10-CM

## 2022-06-02 DIAGNOSIS — R220 Localized swelling, mass and lump, head: Secondary | ICD-10-CM | POA: Insufficient documentation

## 2022-06-02 DIAGNOSIS — F419 Anxiety disorder, unspecified: Secondary | ICD-10-CM | POA: Diagnosis not present

## 2022-06-02 DIAGNOSIS — F32A Depression, unspecified: Secondary | ICD-10-CM | POA: Diagnosis not present

## 2022-06-02 NOTE — Progress Notes (Signed)
[facility] Counselor/Therapist Progress Note  Patient ID: Sarah Shah, MRN: [id number]    Date: 06/02/22  Time Spent: 1:00 pm - 1:57 pm: 57 Minutes  Type of Service Provided Individual Therapy  Type of Contact virtual (via Caregility with real time audio and visual interaction)  Patient Location: home       Provider Location: office  Sarah Shah participated from home, via video, and consented to treatment. Therapist participated from office.   Mental Status Exam: Appearance:  Casual     Behavior: Sharing  Motor: Normal, playing with play-doh and fidget toys  Speech/Language:  Clear and Coherent and Normal Rate  Affect: Appropriate but a bit flat (not out of the ordinary for client)  Mood: normal  Thought process: normal  Thought content:   WNL  Sensory/Perceptual disturbances:   WNL  Orientation: oriented to person, place, time/date, and situation  Attention: Good  Concentration: Good  Memory: WNL  Fund of knowledge:  Fair  Insight:   Fair  Judgment:  Fair  Impulse Control: Fair   Risk Assessment:  Danger to Self:  No - denied SI Self-injurious Behavior: No - denied SIB Danger to Others: No Duty to Warn:no  Presenting Problems, Reported Symptoms, and /or Interim History: Sarah Shah presented for a session to address life stress, anxiety, and mood concerns.   Subjective: Sarah Shah presented for an individual outpatient therapy session. The following was addressed during sessions.   Sarah Shah and therapist have periodically updated/reviewed the goals but during today's session, formally reviewed and updated goals and Sarah Shah was sent the treatment plan form to sign. Sarah Shah agreed with the goals and indicated that there were no additional goals that she wanted to add.  Sarah Shah rated her mood as a 5 out of 10 (with 1 being sad, 5 being neutral, and 10 being happy). Sarah Shah rated her anxiety as a 2 out of 10 and irritation/frustration as a 4 out of 10 (with 10 being  high). Sarah Shah reported having some challenges with communication with her mother but was able to approach her again at a later time and engage in more proactive communication. Ways to manage stress were discussed, as it sounds as though her family members are going through some stressful experiences which is raising her stress level. She passed her classes and started a summer class. There was a challenge in the first day, but she handled the stressor well. She has been working to spend less money and was able to refrain from [employer], despite wanting them. A situation with a friend was discussed.   Interventions/Psychotherapy Techniques Used During Session: Cognitive Behavioral Therapy and Solution-Oriented/Positive Psychology, executive functioning strategies   Diagnosis: Depression, unspecified depression type  Autism spectrum disorder  Anxiety  MENTAL HEALTH INTERVENTIONS USED DURING TREATMENT & PATIENT'S RESPONSE TO INTERVENTIONS:  Short-term Objective addressed today: Sarah Shah will be able to create a plan to address weaknesses described in objective 3A (money management) AND Sarah Shah will be able to implement steps on the plan developed in objective 3B. Mental health techniques used: Objective was addressed in session through the use of Solution-Oriented/Positive Psychology, executive functioning skills, and discussion. Sarah Shah's response was positive. Progress Toward Goal: some progress  Short-term Objective addressed today:Sarah Shah will be able to use appropriate coping strategies including helpful thoughts to maintain a low level of anxiety and depressive symptoms. Mental health techniques used: Objective was addressed in session through the use of  Cognitive Behavioral Therapy and discussion. Sarah Shah's response was positive .  Progress Toward Goal: some progress.     PLAN  1. Sarah Shah will return for a therapy session.   2. Homework Given:  continue to work on saving money, look for social  opportunities, continue with stress management strategies. This homework will be reviewed with Sarah Shah at the next visit.  3. During the next session check in on mood, anxiety, home stress .     Sarah Derby, [name]  Individual Treatment Plan  - please see the notes from 04/17/2021 & 05/01/2021 for complete treatment plan information. - Updated Winter 2024  - Goals formally reviewed and updated with on 06/02/2022 and Sarah Shah indicated agreement.   Problem/Need: Sarah Shah has a history of anxiety and mood concerns - with some ongoing challenges in these areas  Long-Term Goal #1: Sarah Shah will be able to identify and address any mood or anxiety challenges and will maintain a low level of anxiety and depression symptoms as evidenced by self-report. Short-Term Objectives: Objective 1A: Sarah Shah will be able to rate her mood and anxiety - GOAL MET  Objective 1B: Sarah Shah will be able to identify any negative thinking patterns that are perpetuating anxiety or depressive symptoms and replace these with more adaptive thoughts. - GOAL Partially met  Objective 1C: Sarah Shah will be able to use appropriate coping strategies including helpful thoughts to maintain a low level of anxiety and depressive symptoms. Interventions: Cognitive Behavioral Therapy, Psychologist, occupational, Motivational Interviewing, and Psycho-education/Bibliotherapy  coping skills and other evidenced-based practices will be used to promote progress towards healthy functioning and to help manage decrease symptoms associated with their diagnosis.  Treatment Regimen: Individual skill building sessions every 2-4 weeks to address treatment goal/objective Target Date: 05/2023 Responsible Party: therapist and patient Person delivering treatment: Licensed Psychologist Sarah Derby, [name] will support the patient's ability to achieve the goals identified. Resolved:  No - Sarah Shah has made some progress, but mood and anxiety continue to be variable - goal continued    Problem/Need: Life transition - Sarah Shah is in the process of completing a major life transition including graduating from high school, starting college, and potentially working. - As of 06/02/2022 Mindy has graduated from high school and successfully completed 2 semesters of college. However she continues to have life transitions, so the problem/need section has been updated  Updated Problem/Need: Life transition - Lera has continuing life transitions including working towards completing her college program and getting a job.  Long-Term Goal #2: Adlee will successfully navigate these life transitions including being able to manage her stress level. Short-Term Objectives: Objective 2A: Eulogia will be able to identify stressors associated with the life transitions - GOAL MET  - stressors include time management, test taking, and expanding her social circle  Objective 2B: Waunita will be able to identify strategies to help navigate stressors associated with the life transitions and implement them  Interventions: Assertiveness/Communication, Psychologist, occupational, and Psycho-education/Bibliotherapy , and other evidenced-based practices will be used to promote progress towards healthy functioning and to help manage decrease symptoms associated with their diagnosis.  Treatment Regimen: Individual skill building sessions every 2-4 weeks to address treatment goal/objective Target Date: 05/2023 Responsible Party: therapist and patient Person delivering treatment: Licensed Psychologist Sarah Derby, [name] will support the patient's ability to achieve the goals identified. Resolved:  No  Problem/Need: Life skills - Avanna needs to develop some additional skills to facilitate independent living. Long-Term Goal #3: Elvena will identify the additional life skills necessary to facilitate independent living. Short-Term Objectives: Objective 3A: Kertina will be able  to identify areas that she needs to address to  increase the likelihood of her being able to successfully live independently.  - GOAL MET  - areas identified so far (as of 06/02/2022) include money management, cooking, and transportation   Objective 3B: Milen will be able to create a plan to address weaknesses described in objective 3A.   Objective 3C: Savreen will be able to implement steps on the plan developed in objective 3B. Interventions: Motivational Interviewing, Solution-Oriented/Positive Psychology, and Psycho-education/Bibliotherapy  daily life skills, and other evidenced-based practices will be used to promote progress towards healthy functioning and to help manage decrease symptoms associated with their diagnosis.  Treatment Regimen: Individual skill building sessions every 2-4 weeks to address treatment goal/objective Target Date: 05/2023 Responsible Party: therapist and patient Person delivering treatment: Licensed Psychologist Sarah Derby, [name] will support the patient's ability to achieve the goals identified. Resolved:  No - Madgie has made some strides towards identifying areas that need to be addressed to facilitate independent living and made some progress on developing those skills, but process continues   Paula participated in treatment planning: _X_ contributed to goals and plan _X_ aware of plan content __ reviewed written plan __ refused to participate __ unable to participate because _________________________________________   Progress and treatment plan will be reviewed periodically (at least every 12 months, or sooner if needed). This treatment plan was formally reviewed with the client and the form was sent to be signed.  Sarah Derby, [name]

## 2022-06-11 ENCOUNTER — Encounter (INDEPENDENT_AMBULATORY_CARE_PROVIDER_SITE_OTHER): Payer: Self-pay | Admitting: Pediatric Endocrinology

## 2022-06-11 ENCOUNTER — Ambulatory Visit (INDEPENDENT_AMBULATORY_CARE_PROVIDER_SITE_OTHER): Admitting: Pediatric Endocrinology

## 2022-06-11 ENCOUNTER — Encounter: Payer: Self-pay | Admitting: Physician Assistant

## 2022-06-11 DIAGNOSIS — F840 Autistic disorder: Secondary | ICD-10-CM

## 2022-06-11 DIAGNOSIS — F5081 Binge eating disorder: Secondary | ICD-10-CM | POA: Diagnosis not present

## 2022-06-11 DIAGNOSIS — E669 Obesity, unspecified: Secondary | ICD-10-CM

## 2022-06-11 DIAGNOSIS — Z6841 Body Mass Index (BMI) 40.0 and over, adult: Secondary | ICD-10-CM

## 2022-06-11 DIAGNOSIS — E88819 Insulin resistance, unspecified: Secondary | ICD-10-CM | POA: Diagnosis not present

## 2022-06-11 DIAGNOSIS — E282 Polycystic ovarian syndrome: Secondary | ICD-10-CM | POA: Diagnosis not present

## 2022-06-11 LAB — POCT GLYCOSYLATED HEMOGLOBIN (HGB A1C): Hemoglobin A1C: 5.1 % (ref 4.0–5.6)

## 2022-06-11 LAB — POCT GLUCOSE (DEVICE FOR HOME USE): POC Glucose: 108 mg/dl — AB (ref 70–99)

## 2022-06-11 MED ORDER — MOUNJARO 2.5 MG/0.5ML ~~LOC~~ SOAJ: 2.5000 mg | SUBCUTANEOUS | 1 refills | Status: DC

## 2022-06-11 NOTE — Progress Notes (Signed)
Subjective:  Subjective  Patient Name: Sarah Shah Date of Birth: 06-02-2000  MRN: [id number]  Sarah Shah  presents to the office today for follow up evaluation and management of her insulin resistance, morbid obesity, and PCOS  HISTORY OF PRESENT ILLNESS:   Sarah Shah is a 22 y.o. Caucasian female   Sarah Shah was accompanied by her mother (in lobby)  1. Sarah Shah was seen by her PCP in the fall of 2017 for medication adjustment at age 54. She was noted to have ongoing rapid weight gain. 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 She was referred to endocrinology for further evaluation of rapid weight gain.   2. Sarah Shah was last seen in [facility]ocrine clinic on 03/05/22.  In the interim she has been doing generally well.    She has continued on Kariva for menstrual suppression. She feels that it working well.   She had her Gall Bladder removed in March. Ever since then she has had severe nausea with her Mounjaro injections and she has stopped taking it. She has not had any injections in the past 2 months. She is concerned about weight gain without any medication and would like to know if there are other options that are not the injection.   She would like to go the gym at [facility]. However, she feels that her family does not support her in going to the gym. They would rather hike/walk and don't want to go out of their way to take her to the gym.   She is now seeing Dr. Earlene Plater at [facility] (on [address]).  She is seeing Dr. Alphonzo Severance at [facility] for psychology She is also seeing Dr. Tonna Corner at [facility] for med management.  She had an episode of cutting in December. No cutting since.   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  She has 2 ducks, 4 [place] pigs, 1 rabbit, 2 dogs. She is interested in getting a service dog.   She is drinking only water. She has some chocolate fairlife  with breakfast.   3. Pertinent Review of Systems:  Constitutional: The patient feels "tired". The patient seems healthy.   Eyes: Vision seems to be good. There are no recognized eye problems. Wears glasses.   Neck: The patient has no complaints of anterior neck swelling, soreness, tenderness, pressure, discomfort, or difficulty swallowing.   Heart: Heart rate increases with exercise or other physical activity. The patient has no complaints of palpitations, irregular heart beats, chest pain, or chest pressure.   Lungs: no asthma or wheezing Gastrointestinal: Bowel movents seem normal. The patient has no complaints of excessive hunger, acid reflux, upset stomach, stomach aches or pains, diarrhea, or constipation.   Legs: Muscle mass and strength seem normal. There are no complaints of numbness, tingling, burning, or pain. No edema is noted.  Feet: Flat feet- complaining of pain with walking. Neurologic: There are no recognized problems with muscle movement and strength, sensation, or coordination. GYN/GU: Secondary amenorrhea. LMP was about a year ago. On Kariva for menstrual suppression.   PAST MEDICAL, FAMILY, AND SOCIAL HISTORY  Past Medical History:  Diagnosis Date   ADHD (attention deficit hyperactivity disorder)    Anemia    Anxiety    Autism spectrum    Constipated    Constipation    Depression    Development delay    GERD (gastroesophageal reflux disease)    Headache  Insulin resistance    Iron deficiency    Learning disability    Obesity    ODD (oppositional defiant disorder)    PCOS (polycystic ovarian syndrome)    PMDD (premenstrual dysphoric disorder)    Pneumonia    3 mos old and 38 mos old   Visual acuity reduced    glasses    Family History  Problem Relation Age of Onset   Diabetes Mother    Hypertension Mother    Anxiety disorder Mother    Other Mother        Premenstrual dysphoic disorder   Obesity Mother    Stroke Mother    Colon polyps Mother     Irritable bowel syndrome Mother    Diabetes Father        type 1   Depression Father    Anxiety disorder Father    Obesity Father    Anxiety disorder Sister    ADD / ADHD Sister        ADD   Other Sister        Premenstrual dysphoric disorder   Atrial fibrillation Maternal Grandmother    Diabetes Maternal Grandmother    Heart failure Maternal Grandmother    Hypertension Maternal Grandfather    Iron deficiency Maternal Aunt    Breast cancer Maternal Aunt      Current Outpatient Medications:    amphetamine-dextroamphetamine (ADDERALL XR) 30 MG 24 hr capsule, Take 30 mg by mouth every morning., Disp: , Rfl:    busPIRone (BUSPAR) 30 MG tablet, Take 30 mg by mouth 2 (two) times daily., Disp: , Rfl:    cloNIDine HCl (KAPVAY) 0.1 MG TB12 ER tablet, Take 2 tablets (0.2 mg total) by mouth 2 (two) times daily. Take 2 tablets by mouth 2 times daily (Patient taking differently: Take 0.2 mg by mouth 2 (two) times daily.), Disp: 360 tablet, Rfl: 1   desogestrel-ethinyl estradiol (VIORELE) 0.15-0.02/0.01 MG (21/5) tablet, Take 1 tablet by mouth daily. Please give active pills only. Skip placebo pills., Disp: 140 tablet, Rfl: 3   dicyclomine (BENTYL) 20 MG tablet, Take 1 tablet (20 mg total) by mouth every 6 (six) hours. (Patient taking differently: Take 20 mg by mouth every 6 (six) hours as needed (stomach pain).), Disp: 60 tablet, Rfl: 0   docusate sodium (COLACE) 100 MG capsule, Take 100 mg by mouth at bedtime., Disp: , Rfl:    ferrous sulfate 325 (65 FE) MG tablet, Take 325 mg by mouth at bedtime., Disp: , Rfl:    FLUoxetine (PROZAC) 40 MG capsule, Take 1 capsule (40 mg total) by mouth daily., Disp: , Rfl:    omeprazole (PRILOSEC) 40 MG capsule, Take 1 capsule (40 mg total) by mouth daily., Disp: 30 capsule, Rfl: 3   tirzepatide (MOUNJARO) 2.5 MG/0.5ML Pen, Inject 2.5 mg into the skin once a week., Disp: 2 mL, Rfl: 1   Topiramate ER (TROKENDI XR) 50 MG CP24, Take 1 capsule by mouth daily., Disp:  30 capsule, Rfl: 1   vitamin C (ASCORBIC ACID) 500 MG tablet, Take 500 mg by mouth at bedtime. (Patient not taking: Reported on 06/11/2022), Disp: , Rfl:   Allergies as of 06/11/2022 - Review Complete 06/11/2022  Allergen Reaction Noted   Wound dressing adhesive Itching 06/11/2022   Mounjaro [tirzepatide] Diarrhea and Other (See Comments) 05/28/2022     reports that she has never smoked. She has never used smokeless tobacco. She reports that she does not drink alcohol and does not use drugs. Pediatric  History  Patient Parents   Milinda Hirschfeld (Mother)   Other Topics Concern   Not on file  Social History Narrative   Will start 12th grade at [employer] for the 22/23 school year.       07/20/19   Enjoys: sleep, paints, sewing (sock dolls), animal care   From: born in [place] but moved her when she was little   Who is at home: with mom Stanton Kidney, stepdad Thayer Ohm, and sister [place]   Pets: loves her [place] pigs, Careers information officer, ducks, dogs, classroom bunny   School: [employer]    Grade: 11th grade this fall      Family: good relationship with mom and family       Exercise: walking with mom    Diet: avoiding reflux worsening food      Safety   Seat belts: Yes    Guns: Yes  and secure   Safe in relationships: Yes    Helmets: Yes    Smoke Exposure at home: No   Bullying: Yes  and knows who she can ask for help and feels    1. School and Family:  Taking classes at [employer]. 2. Activities:  3. Primary Care Provider: Mort Sawyers, FNP  ROS: There are no other significant problems involving Sarah Shah other body systems.    Objective:  Objective  Vital Signs:       03/05/22 09:32  BP 126/70  Pulse Rate 100  Weight 309 lb 6.4 oz (H)  (H): Data is abnormally high  BP 124/80 (BP Location: Left Arm, Patient Position: Sitting, Cuff Size: Large)   Pulse 72   Wt (!) 325 lb 9.6 oz (147.7 kg)   BMI 54.18 kg/m   Growth %ile SmartLinks can only be used for patients less than 60  years old.  Ht Readings from Last 3 Encounters:  05/28/22 5\' 5"  (1.651 m)  04/10/22 5\' 5"  (1.651 m)  03/31/22 5\' 5"  (1.651 m)   Wt Readings from Last 3 Encounters:  06/11/22 (!) 325 lb 9.6 oz (147.7 kg)  05/28/22 (!) 323 lb 3.2 oz (146.6 kg)  04/10/22 (!) 304 lb (137.9 kg)   HC Readings from Last 3 Encounters:  No data found for [facility]   Body surface area is 2.6 meters squared. Facility age limit for growth %iles is 20 years. Facility age limit for growth %iles is 20 years.    PHYSICAL EXAM:    Constitutional: The patient appears comfortable.  The patient's height and weight are obese for age. Weight is increased 16 pounds Head: The head is normocephalic. Face: The face appears normal. There are no obvious dysmorphic features. Eyes: The eyes appear to be normally formed and spaced. Gaze is conjugate. There is no obvious arcus or proptosis. Moisture appears normal. Ears: The ears are normally placed and appear externally normal. Mouth: The oropharynx and tongue appear normal. Dentition appears to be normal for age. Oral moisture is normal. Neck: The neck appears to be visibly normal.. The consistency of the thyroid gland is normal. The thyroid gland is not tender to palpation. Trace acanthosis- stable Lungs: No increased work of breathing. No cough Heart: Heart rate regular. Pulses and peripheral perfusion regular Abdomen: The abdomen appears to be obese in size for the patient's age. She has diffuse abdominal tenderness focused in right upper quadrant but also across the upper abdomen and down into the left lower quadrant.  Arms: Muscle size and bulk are normal for age. Hands: There is no obvious tremor.  Phalangeal and metacarpophalangeal joints are normal. Palmar muscles are normal for age. Palmar skin is normal. Palmar moisture is also normal. Legs: Muscles appear normal for age. No edema is present. Feet: Feet are normally formed. Dorsalis pedal pulses are normal. Neurologic:  Strength is normal for age in both the upper and lower extremities. Muscle tone is normal. Sensation to touch is normal in both the legs and feet.   Skin: stretch marks on abdomen, back, legs, arms. Mostly reddish to flesh colored. Mild hair growth on sideburns and chin.    LAB DATA:     Lab Results  Component Value Date   HGBA1C 5.1 06/11/2022   HGBA1C 5.0 03/05/2022   HGBA1C 5.3 12/01/2021   HGBA1C 4.9 04/16/2021   HGBA1C 4.7 11/28/2020   HGBA1C 5.1 07/25/2020   HGBA1C 5.5 03/25/2020   HGBA1C 5.2 03/12/2020    Results for orders placed or performed in visit on 06/11/22  POCT glycosylated hemoglobin (Hb A1C)  Result Value Ref Range   Hemoglobin A1C 5.1 4.0 - 5.6 %   HbA1c POC (<> result, manual entry)     HbA1c, POC (prediabetic range)     HbA1c, POC (controlled diabetic range)    POCT Glucose (Device for Home Use)  Result Value Ref Range   Glucose Fasting, POC     POC Glucose 108 (A) 70 - 99 mg/dl       Assessment and Plan:  Assessment  ASSESSMENT: Sarah Shah is a 22 y.o. Caucasian female with autism, developmental delay, anger/behavior concerns and evidence of insulin resistance/PCOS associated with obesity.    Insulin resistance/hyperphagia/Metabolic Syndrome  - A1C continues in normal rage - She [facility] no [facility] - too weight focused.  - Acanthosis has been stable - Was having good weight loss on Mounjaro. Discontinued prior to [facility] Bladder removal. Did not tolerate restarting at 5 mg. Will restart at 2.5 mg now.    Obesity - Class 3 obesity - BMI >50 - Has failed a variety of oral weight loss medications - Previously had good weight loss on Mounjaro - Will restart Mounjaro  PCOS - On Kariva (generic) OCP.  - Now doing continuous cycling without breakthrough bleeding - She is much happier and less snarky/depressed/irritable - androgen levels have improved - Despite no menses she has continued to require iron infusions at  hematology   PLAN:    1. Diagnostic: Lab Orders         POCT glycosylated hemoglobin (Hb A1C)         POCT Glucose (Device for Home Use)     2. Therapeutic: Reviewed lifestyle goals.  Meds ordered this encounter  Medications   tirzepatide (MOUNJARO) 2.5 MG/0.5ML Pen    Sig: Inject 2.5 mg into the skin once a week.    Dispense:  2 mL    Refill:  1    3. Patient education: Discussion of the above.  4. Follow-up: Return in about 6 weeks (around 07/23/2022).      Dessa Phi, MD  >40 minutes spent today reviewing the medical chart, counseling the patient/family, and documenting today's encounter.

## 2022-06-11 NOTE — Patient Instructions (Signed)
Please schedule with Donetta  [email or web address]  Continue Kariva Start Mounjaro at 2.5 mg - rx sent to [facility] on [address] and [address]

## 2022-06-16 ENCOUNTER — Ambulatory Visit (INDEPENDENT_AMBULATORY_CARE_PROVIDER_SITE_OTHER): Admitting: Clinical

## 2022-06-16 ENCOUNTER — Encounter: Attending: Pediatric Endocrinology | Admitting: Registered"

## 2022-06-16 DIAGNOSIS — Z713 Dietary counseling and surveillance: Secondary | ICD-10-CM | POA: Diagnosis present

## 2022-06-16 DIAGNOSIS — F419 Anxiety disorder, unspecified: Secondary | ICD-10-CM

## 2022-06-16 DIAGNOSIS — F840 Autistic disorder: Secondary | ICD-10-CM

## 2022-06-16 DIAGNOSIS — F32A Depression, unspecified: Secondary | ICD-10-CM | POA: Diagnosis not present

## 2022-06-16 NOTE — Patient Instructions (Signed)
-   Aim to have 3 meals a day to include at least 3 food groups, such as: Breakfast: greek yogurt + toast + fruit or cereal + milk + fruit  Lunch: ham and cheese sandwich + raw broccoli as side item Dinner: burger with lettuce, tomatoes, and cheese + salad with ranch dressing   - Snacks can be 2 food groups such as: Protein bars Apple + peanut butter  Trail mix Cheez-its + cucumbers

## 2022-06-16 NOTE — Progress Notes (Signed)
[facility] Counselor/Therapist Progress Note  Patient ID: Marguret Slingsby, MRN: [id number]    Date: 06/16/22  Time Spent: 12:58 pm - 1:57 pm: 59 Minutes  Type of Service Provided Individual Therapy  Type of Contact in-person Location: office   Mental Status Exam: Appearance:  Casual     Behavior: Passive in the beginning and then appropriately sharing  Motor: Normal  Speech/Language:  Clear and Coherent  Affect: Tearful at times, appropriate at other times  Mood: Variable, sad to average  Thought process: normal  Thought content:   WNL  Sensory/Perceptual disturbances:   WNL  Orientation: oriented to person, place, time/date, and situation  Attention: Fair  Concentration: Fair  Memory: WNL  Fund of knowledge:  Fair  Insight:   Fair  Judgment:  Fair  Impulse Control: Fair   Risk Assessment:  Danger to Self:  No - denied SI Self-injurious Behavior: No - denied SIB Danger to Others: No Duty to Warn:no  Presenting Problems, Reported Symptoms, and /or Interim History: Kalenna presented for a session to address life stress and life skills as well as mood challenges.   Subjective: Rejoice presented for an individual outpatient therapy session. Her mother joined for the beginning of the visit. The following was addressed during sessions.   Tarica's mother reported that they have continued to work on finances, as Darylene has continued to have difficulty not spending more money than she has/spending compulsively. Victorina dropped her summer class. Challenges with communication and daily skills were discussed.   Abeni rated her mood as a 5 out of 10 (with 1 being sad, 5 being neutral, and 10 being happy). Midajah rated her anxiety as a 5 out of 10 and irritation/frustration as a 5 out of 10 (with 10 being high). Mikelle reported that at home she has been experiencing elevated levels of depression (rating her mood at home as a 4 out of 10) because she feels that everyone is  expecting too much from her. How to navigate some challenging family situations and behaviors that may help to mitigate symptoms of depression were discussed.   Interventions/Psychotherapy Techniques Used During Session: Cognitive Behavioral Therapy and Solution-Oriented/Positive Psychology  Diagnosis: Depression, unspecified depression type  Autism spectrum disorder  Anxiety  MENTAL HEALTH INTERVENTIONS USED DURING TREATMENT & PATIENT'S RESPONSE TO INTERVENTIONS:  Short-term Objective addressed today:Kirk will be able to use appropriate coping strategies including helpful thoughts to maintain a low level of anxiety and depressive symptoms. Mental health techniques used: Objective was addressed in session through the use of Cognitive Behavioral Therapy and Solution-Oriented/Positive Psychology, and discussion. Vonetta's response was positive. Progress Toward Goal: some increase in symptoms  Short-term Objective addressed today:Geniyah will be able to create a plan to address weaknesses described in objective 3A.  AND Neysha will be able to implement steps on the plan developed in objective 3B. Mental health techniques used: Objective was addressed in session through the use of Solution-Oriented/Positive Psychology, life skills and executive functioning skill training, and discussion. Dyan's response was mixed. Progress Toward Goal: slight progress     PLAN  1. Maliea will return for a therapy session.   2. Homework Given:  implement new money management strategies, engage in depression management strategies. This homework will be reviewed with [name] at the next visit.  3. During the next session check in on mood, anxiety, and money management, begin to explore settings for Anistyn to get involved in.     [name], [name]  Individual Treatment Plan  -  please see the notes from 04/17/2021 & 05/01/2021 for the initial treatment plan, the updates in Winter 2024, and the goal review from 06/02/2022 for  complete treatment plan information.    Problem/Need: Lihanna has a history of anxiety and mood concerns - with some ongoing challenges in these areas  Long-Term Goal #1: Marijah will be able to identify and address any mood or anxiety challenges and will maintain a low level of anxiety and depression symptoms as evidenced by self-report. Short-Term Objectives: Objective 1A: Diyora will be able to rate her mood and anxiety - GOAL MET  Objective 1B: Ameka will be able to identify any negative thinking patterns that are perpetuating anxiety or depressive symptoms and replace these with more adaptive thoughts. - GOAL Partially met  Objective 1C: Treyana will be able to use appropriate coping strategies including helpful thoughts to maintain a low level of anxiety and depressive symptoms. Interventions: Cognitive Behavioral Therapy, Psychologist, occupational, Motivational Interviewing, and Psycho-education/Bibliotherapy  coping skills and other evidenced-based practices will be used to promote progress towards healthy functioning and to help manage decrease symptoms associated with their diagnosis.  Treatment Regimen: Individual skill building sessions every 2-4 weeks to address treatment goal/objective Target Date: 05/2023 Responsible Party: therapist and patient Person delivering treatment: Licensed Psychologist [name], [name] will support the patient's ability to achieve the goals identified. Resolved:  No - Lanyla has made some progress, but mood and anxiety continue to be variable - goal continued   Problem/Need: Life transition - Briauna is in the process of completing a major life transition including graduating from high school, starting college, and potentially working. - As of 06/02/2022 Kynsleigh has graduated from high school and successfully completed 2 semesters of college. However she continues to have life transitions, so the problem/need section has been updated  Updated Problem/Need: Life transition  - Jernie has continuing life transitions including working towards completing her college program and getting a job.  Long-Term Goal #2: Berit will successfully navigate these life transitions including being able to manage her stress level. Short-Term Objectives: Objective 2A: Emmee will be able to identify stressors associated with the life transitions - GOAL MET  - stressors include time management, test taking, and expanding her social circle  Objective 2B: Saniyyah will be able to identify strategies to help navigate stressors associated with the life transitions and implement them  Interventions: Assertiveness/Communication, Psychologist, occupational, and Psycho-education/Bibliotherapy , and other evidenced-based practices will be used to promote progress towards healthy functioning and to help manage decrease symptoms associated with their diagnosis.  Treatment Regimen: Individual skill building sessions every 2-4 weeks to address treatment goal/objective Target Date: 05/2023 Responsible Party: therapist and patient Person delivering treatment: Licensed Psychologist [name], [name] will support the patient's ability to achieve the goals identified. Resolved:  No  Problem/Need: Life skills - Myona needs to develop some additional skills to facilitate independent living. Long-Term Goal #3: Brylin will identify the additional life skills necessary to facilitate independent living. Short-Term Objectives: Objective 3A: Toney will be able to identify areas that she needs to address to increase the likelihood of her being able to successfully live independently.  - GOAL MET  - areas identified so far (as of 06/02/2022) include money management, cooking, and transportation   Objective 3B: Oreal will be able to create a plan to address weaknesses described in objective 3A.   Objective 3C: Monick will be able to implement steps on the plan developed in objective 3B. Interventions: Motivational  Interviewing, Solution-Oriented/Positive Psychology, and Psycho-education/Bibliotherapy  daily life skills, and other evidenced-based practices will be used to promote progress towards healthy functioning and to help manage decrease symptoms associated with their diagnosis.  Treatment Regimen: Individual skill building sessions every 2-4 weeks to address treatment goal/objective Target Date: 05/2023 Responsible Party: therapist and patient Person delivering treatment: Licensed Psychologist [name], [name] will support the patient's ability to achieve the goals identified. Resolved:  No - Samamtha has made some strides towards identifying areas that need to be addressed to facilitate independent living and made some progress on developing those skills, but process continues   [name], [name]

## 2022-06-16 NOTE — Progress Notes (Signed)
Appointment start time: 5:07  Appointment end time: 6:00  Patient was seen on 06/16/2022 for nutrition counseling pertaining to disordered eating  Primary care provider: Mort Sawyers, FNP Therapist: Lawson Fiscal  ROI: will complete at next visit Any other medical team members: medication management Parents: mom Eunice Blase)   Assessment  Pt arrives with mom. Mom states pt is starting [place] due to recent insurance approval. Reports pt was taking Mounjaro and things improved for her but lost insurance coverage and stopped taking it. Pt states she she wants to improve her relationship with food and have a better understanding of what she should be eating and not eating. States she has nights when they cravings are strong.   Mom reports patient has struggled with PCOS and effects of it over the years.    Eating history: Length of time:  Previous treatments: has seen RD before Goals for RD meetings:   Weight history:  Highest weight:    Lowest weight:  Most consistent weight:   What would you like to weigh: How has weight changed in the past year:   Medical Information:  Changes in hair, skin, nails since ED started:  Chewing/swallowing difficulties:  Reflux or heartburn:  Trouble with teeth:  LMP without the use of hormones:   Weight at that point:  Effect of exercise on menses:    Effect of hormones on menses:  Constipation, diarrhea:  Dizziness/lightheadedness:  Headaches/body aches:  Heart racing/chest pain:  Mood:  Sleep:  Focus/concentration:  Cold intolerance:  Vision changes:   Mental health diagnosis: BED   Dietary assessment: A typical day consists of 2 meals and 3 snacks  Safe foods include: Ramen noodles, cereal, [employer] chocolate milk with special K, soft serve ice cream from [employer], sandwich, water, bell peppers, raw broccoli, chocolate, cookies, candy, chips, bananas, apples, strawberries, kiwi, pineapple, cantaloupe, asparagus, green beans, lettuce,  salads (sometimes), tomatoes, cucumbers, chicken, steak, bologna, [place], beef, bread, toast, granola, muffins, waffles/pancakes, potatoes, french fries, corn, baked beans, black beans, pinto beans, pasta, rice, mac and cheese, flip greek yogurt, cheese, Boba tea  Avoided foods include: zucchini, onions, carrots, fish, seafood, red sauce, garlic, lemons, orange juice  24 hour recall:  B: [employer] chocolate milk + Special K cereal  S: L: chips S: TBS of ice cream D: salad (chicken, lettuce, spinach, croutons) S: 1.5 [employer] cookies + bowl of ice cream  Beverages: water   What Methods Do You Use To Control Your Weight (Compensatory behaviors)?           Restricting (calories, fat, carbs)  SIV  Diet pills  Laxatives  Diuretics  Alcohol or drugs  Exercise (what type)  Food rules or rituals (explain)  Binge  Estimated energy intake: 1700-1800 kcal  Estimated energy needs: 2000-2200 kcal 225-248 g CHO 150-165 g pro 56-61 g fat  Nutrition Diagnosis: NB-1.5 Disordered eating pattern As related to binge eating disorder.  As evidenced by intake reflecting an imbalance of food groups.  Intervention/Goals: Pt was educated and counseled on the benefits of eating throughout the day, eating to fuel the body, and meal/snack ideas. Pt agreed with goals listed. Goals: - Aim to have 3 meals a day to include at least 3 food groups, such as: Breakfast: greek yogurt + toast + fruit or cereal + milk + fruit  Lunch: ham and cheese sandwich + raw broccoli as side item Dinner: burger with lettuce, tomatoes, and cheese + salad with ranch dressing - Snacks can be 2 food groups  such as: Protein bars Apple + peanut butter  Trail mix Cheez-its + cucumbers  Meal plan:    3 meals    1-3 snacks  Monitoring and Evaluation: Patient will follow up in 7 weeks.

## 2022-06-17 ENCOUNTER — Encounter (INDEPENDENT_AMBULATORY_CARE_PROVIDER_SITE_OTHER): Payer: Self-pay | Admitting: Pediatric Endocrinology

## 2022-06-19 ENCOUNTER — Other Ambulatory Visit (INDEPENDENT_AMBULATORY_CARE_PROVIDER_SITE_OTHER): Payer: Self-pay | Admitting: Pediatric Endocrinology

## 2022-06-22 ENCOUNTER — Telehealth (INDEPENDENT_AMBULATORY_CARE_PROVIDER_SITE_OTHER): Payer: Self-pay

## 2022-06-22 DIAGNOSIS — E88819 Insulin resistance, unspecified: Secondary | ICD-10-CM

## 2022-06-22 NOTE — Telephone Encounter (Addendum)
Fax from [employer] PA needed for Mounjaro 2.5 mg/ 0.5 mL. Pt has [employer].  [employer] website states:  (https://www.express-scripts.com/frontend/open-enrollment/tricare/fst/#/[email or web address]/results)    Website offered a PA form to submit.

## 2022-06-23 ENCOUNTER — Encounter (INDEPENDENT_AMBULATORY_CARE_PROVIDER_SITE_OTHER): Payer: Self-pay | Admitting: Pediatric Endocrinology

## 2022-06-23 MED ORDER — MOUNJARO 2.5 MG/0.5ML ~~LOC~~ SOAJ: 2.5000 mg | SUBCUTANEOUS | 5 refills | Status: DC

## 2022-06-23 NOTE — Addendum Note (Signed)
Addended by: Pollie Friar D on: 06/23/2022 04:35 PM   Modules accepted: Orders

## 2022-06-23 NOTE — Telephone Encounter (Signed)
Submitted PA for Mounjaro  on [employer] thru pts medicaid. Dr Vanessa [place] States that other than [facility], pt has tried and failed Metformin and Ozempic.    Confirmation [id number] WPrior Approval [id number] Status:APPROVED

## 2022-06-23 NOTE — Telephone Encounter (Signed)
She also has Medicaid and they covered it previously. Is her approval from last fall still active?

## 2022-06-23 NOTE — Telephone Encounter (Signed)
[facility] APPROVED thru 06/23/23

## 2022-06-24 ENCOUNTER — Encounter: Payer: Self-pay | Admitting: Family

## 2022-07-01 ENCOUNTER — Ambulatory Visit (INDEPENDENT_AMBULATORY_CARE_PROVIDER_SITE_OTHER): Admitting: Clinical

## 2022-07-01 DIAGNOSIS — F419 Anxiety disorder, unspecified: Secondary | ICD-10-CM | POA: Diagnosis not present

## 2022-07-01 DIAGNOSIS — F32A Depression, unspecified: Secondary | ICD-10-CM

## 2022-07-01 DIAGNOSIS — F840 Autistic disorder: Secondary | ICD-10-CM | POA: Diagnosis not present

## 2022-07-01 NOTE — Progress Notes (Signed)
[facility] Counselor/Therapist Progress Note  Patient ID: Sarah Shah, MRN: [id number]    Date: 07/01/22  Time Spent: 11:13 am - 11:55 am: 42 Minutes  Type of Service Provided Individual Therapy  Type of Contact virtual (via [employer] with real time audio and visual interaction)  Patient Location: home       Provider Location: office  [name] participated from home, via video, and consented to treatment. Therapist participated from office. Patient consented to telehealth therapy and is aware and consented to the limitations of telehealth.   Mental Status Exam: Appearance:  Casual     Behavior: Drowsy  Motor: Normal  Speech/Language:  Clear and Coherent and Normal Rate  Affect: Flat  Mood: Neutral to sad  Thought process: normal  Thought content:   WNL  Sensory/Perceptual disturbances:   WNL  Orientation: oriented to person, place, time/date, and situation  Attention: Fair  Concentration: Fair  Memory: WNL  Fund of knowledge:  Fair  Insight:   Poor to fair  Judgment:  Fair  Impulse Control: Fair   Risk Assessment:  Danger to Self:  No - denied SI Self-injurious Behavior: No - denied SIB Danger to Others: No Duty to Warn:no  - of not although no concerns about safety were noted today, given Salimata's negative mood reminded her of safety plan if things change and she agreed to plan and contracted for safety - also explained procedure for what do if symptoms escalate when therapist is out of town in early July.   Presenting Problems, Reported Symptoms, and /or Interim History: [name] presented for a session to address mood and life stress concerns.   Subjective: Niambi presented for an individual outpatient therapy session. The following was addressed during sessions.   Scherri rated her mood as a 3 out of 10 (with 1 being sad, 5 being neutral, and 10 being happy).  Kamri rated her anxiety as a 9 out of 10 and irritation/frustration as a 5 out of 10  (with 10 being high). Anxiety and mood challenges were related to negative interactions with her mother. Specifically, Sajda and her mother had a disagreement the morning of the visit about household responsibilities. This was discussed and problem solved. Creating a summer schedule for herself was also discussed. Zianne reported that before today her mood had shown a bit of improvement from last session. Activities and strategies that she could use to improve her mood were discussed. Hawo is planning a trip with her sister that she is looking forward to. Caryol has started using a separate card for her spending money but has also continued to overspend.    Interventions/Psychotherapy Techniques Used During Session: Cognitive Behavioral Therapy and Solution-Oriented/Positive Psychology  Diagnosis: Depression, unspecified depression type  Autism spectrum disorder  Anxiety  MENTAL HEALTH INTERVENTIONS USED DURING TREATMENT & PATIENT'S RESPONSE TO INTERVENTIONS:  Short-term Objective addressed today: Alaiyna will be able to use appropriate coping strategies including helpful thoughts to maintain a low level of anxiety and depressive symptoms. Mental health techniques used: Objective was addressed in session through the use of Cognitive Behavioral Therapy and Solution-Oriented/Positive Psychology and discussion. Tattiana's response was mixed. Progress Toward Goal: some recent increase in symptoms  Short-term Objective addressed today:Seema will be able to implement steps on the plan developed in objective 3B. Mental health techniques used: Objective was addressed in session through the use of Solution-Oriented/Positive Psychology and discussion. Cathie's response was mixed. Progress Toward Goal: variable progress - she has been using an alternative  card but also has continued to overspend     PLAN  1. Claritza will return for a therapy session.   2. Homework Given:  create a schedule for herself - try to do one  household chore a day after her chill time, see if her mother will write the chore down for her an leave it where she will find it to try to avoid negative interactions with her mother. This homework will be reviewed with [name] at the next visit.  3. During the next session check in on mood, anxiety, etc. .     [name], [name]  Individual Treatment Plan  - please see the notes from 04/17/2021 & 05/01/2021 for the initial treatment plan, the updates in Winter 2024, and the goal review from 06/02/2022 for complete treatment plan information.    Problem/Need: Queen has a history of anxiety and mood concerns - with some ongoing challenges in these areas  Long-Term Goal #1: Norrine will be able to identify and address any mood or anxiety challenges and will maintain a low level of anxiety and depression symptoms as evidenced by self-report. Short-Term Objectives: Objective 1A: Brittnee will be able to rate her mood and anxiety - GOAL MET  Objective 1B: Elsey will be able to identify any negative thinking patterns that are perpetuating anxiety or depressive symptoms and replace these with more adaptive thoughts. - GOAL Partially met  Objective 1C: Oma will be able to use appropriate coping strategies including helpful thoughts to maintain a low level of anxiety and depressive symptoms. Interventions: Cognitive Behavioral Therapy, Psychologist, occupational, Motivational Interviewing, and Psycho-education/Bibliotherapy  coping skills and other evidenced-based practices will be used to promote progress towards healthy functioning and to help manage decrease symptoms associated with their diagnosis.  Treatment Regimen: Individual skill building sessions every 2-4 weeks to address treatment goal/objective Target Date: 05/2023 Responsible Party: therapist and patient Person delivering treatment: Licensed Psychologist [name], [name] will support the patient's ability to achieve the goals identified. Resolved:  No  - Kirby has made some progress, but mood and anxiety continue to be variable - goal continued   Problem/Need: Life transition - Mahum is in the process of completing a major life transition including graduating from high school, starting college, and potentially working. - As of 06/02/2022 Laylanie has graduated from high school and successfully completed 2 semesters of college. However she continues to have life transitions, so the problem/need section has been updated  Updated Problem/Need: Life transition - Mysha has continuing life transitions including working towards completing her college program and getting a job.  Long-Term Goal #2: Quinnleigh will successfully navigate these life transitions including being able to manage her stress level. Short-Term Objectives: Objective 2A: Lucciana will be able to identify stressors associated with the life transitions - GOAL MET  - stressors include time management, test taking, and expanding her social circle  Objective 2B: Shauni will be able to identify strategies to help navigate stressors associated with the life transitions and implement them  Interventions: Assertiveness/Communication, Psychologist, occupational, and Psycho-education/Bibliotherapy , and other evidenced-based practices will be used to promote progress towards healthy functioning and to help manage decrease symptoms associated with their diagnosis.  Treatment Regimen: Individual skill building sessions every 2-4 weeks to address treatment goal/objective Target Date: 05/2023 Responsible Party: therapist and patient Person delivering treatment: Licensed Psychologist [name], [name] will support the patient's ability to achieve the goals identified. Resolved:  No  Problem/Need: Life skills - Helenna needs to  develop some additional skills to facilitate independent living. Long-Term Goal #3: Marrietta will identify the additional life skills necessary to facilitate independent living. Short-Term  Objectives: Objective 3A: Jailee will be able to identify areas that she needs to address to increase the likelihood of her being able to successfully live independently.  - GOAL MET  - areas identified so far (as of 06/02/2022) include money management, cooking, and transportation   Objective 3B: Oakley will be able to create a plan to address weaknesses described in objective 3A.   Objective 3C: Alynna will be able to implement steps on the plan developed in objective 3B. Interventions: Motivational Interviewing, Solution-Oriented/Positive Psychology, and Psycho-education/Bibliotherapy  daily life skills, and other evidenced-based practices will be used to promote progress towards healthy functioning and to help manage decrease symptoms associated with their diagnosis.  Treatment Regimen: Individual skill building sessions every 2-4 weeks to address treatment goal/objective Target Date: 05/2023 Responsible Party: therapist and patient Person delivering treatment: Licensed Psychologist [name], [name] will support the patient's ability to achieve the goals identified. Resolved:  No - Beneta has made some strides towards identifying areas that need to be addressed to facilitate independent living and made some progress on developing those skills, but process continues   [name], [name]

## 2022-07-09 ENCOUNTER — Encounter (INDEPENDENT_AMBULATORY_CARE_PROVIDER_SITE_OTHER): Payer: Self-pay | Admitting: Pediatric Endocrinology

## 2022-07-09 ENCOUNTER — Encounter: Payer: Self-pay | Admitting: Physician Assistant

## 2022-07-10 ENCOUNTER — Encounter (INDEPENDENT_AMBULATORY_CARE_PROVIDER_SITE_OTHER): Payer: Self-pay

## 2022-07-13 ENCOUNTER — Encounter: Payer: Self-pay | Admitting: Physician Assistant

## 2022-07-14 ENCOUNTER — Encounter: Payer: Self-pay | Admitting: Physician Assistant

## 2022-07-15 ENCOUNTER — Inpatient Hospital Stay: Attending: Physician Assistant

## 2022-07-15 ENCOUNTER — Other Ambulatory Visit (INDEPENDENT_AMBULATORY_CARE_PROVIDER_SITE_OTHER)

## 2022-07-15 ENCOUNTER — Other Ambulatory Visit: Payer: Self-pay

## 2022-07-15 ENCOUNTER — Telehealth: Payer: Self-pay | Admitting: Physician Assistant

## 2022-07-15 ENCOUNTER — Encounter: Payer: Self-pay | Admitting: Physician Assistant

## 2022-07-15 ENCOUNTER — Other Ambulatory Visit: Payer: Self-pay | Admitting: Physician Assistant

## 2022-07-15 ENCOUNTER — Ambulatory Visit (INDEPENDENT_AMBULATORY_CARE_PROVIDER_SITE_OTHER): Admitting: Physician Assistant

## 2022-07-15 VITALS — BP 100/60 | HR 105 | Ht 65.0 in | Wt 319.2 lb

## 2022-07-15 DIAGNOSIS — R790 Abnormal level of blood mineral: Secondary | ICD-10-CM

## 2022-07-15 DIAGNOSIS — E611 Iron deficiency: Secondary | ICD-10-CM | POA: Diagnosis present

## 2022-07-15 DIAGNOSIS — K219 Gastro-esophageal reflux disease without esophagitis: Secondary | ICD-10-CM

## 2022-07-15 LAB — CBC WITH DIFFERENTIAL (CANCER CENTER ONLY)
Abs Immature Granulocytes: 0.02 10*3/uL (ref 0.00–0.07)
Basophils Absolute: 0 10*3/uL (ref 0.0–0.1)
Basophils Relative: 1 %
Eosinophils Absolute: 0.1 10*3/uL (ref 0.0–0.5)
Eosinophils Relative: 1 %
HCT: 42.3 % (ref 36.0–46.0)
Hemoglobin: 15 g/dL (ref 12.0–15.0)
Immature Granulocytes: 0 %
Lymphocytes Relative: 34 %
Lymphs Abs: 2.6 10*3/uL (ref 0.7–4.0)
MCH: 29.9 pg (ref 26.0–34.0)
MCHC: 35.5 g/dL (ref 30.0–36.0)
MCV: 84.4 fL (ref 80.0–100.0)
Monocytes Absolute: 0.5 10*3/uL (ref 0.1–1.0)
Monocytes Relative: 7 %
Neutro Abs: 4.4 10*3/uL (ref 1.7–7.7)
Neutrophils Relative %: 57 %
Platelet Count: 321 10*3/uL (ref 150–400)
RBC: 5.01 MIL/uL (ref 3.87–5.11)
RDW: 11.6 % (ref 11.5–15.5)
WBC Count: 7.7 10*3/uL (ref 4.0–10.5)
nRBC: 0 % (ref 0.0–0.2)

## 2022-07-15 LAB — IRON AND IRON BINDING CAPACITY (CC-WL,HP ONLY)
Iron: 53 ug/dL (ref 28–170)
Saturation Ratios: 15 % (ref 10.4–31.8)
TIBC: 361 ug/dL (ref 250–450)
UIBC: 308 ug/dL (ref 148–442)

## 2022-07-15 LAB — FERRITIN: Ferritin: 350 ng/mL — ABNORMAL HIGH (ref 11–307)

## 2022-07-15 LAB — MAGNESIUM: Magnesium: 1.5 mg/dL (ref 1.5–2.5)

## 2022-07-15 MED ORDER — OMEPRAZOLE 40 MG PO CPDR: 40.0000 mg | DELAYED_RELEASE_CAPSULE | Freq: Every day | ORAL | 4 refills | Status: DC

## 2022-07-15 NOTE — Patient Instructions (Addendum)
   Continue your Omeprazole 40 mg daily before breakfast meals each day _______________________________________________________  If your blood pressure at your visit was 140/90 or greater, please contact your primary care physician to follow up on this.  _______________________________________________________  If you are age 21 or older, your body mass index should be between 23-30. Your Body mass index is 53.12 kg/m. If this is out of the aforementioned range listed, please consider follow up with your Primary Care Provider.  If you are age 32 or younger, your body mass index should be between 19-25. Your Body mass index is 53.12 kg/m. If this is out of the aformentioned range listed, please consider follow up with your Primary Care Provider.   ________________________________________________________  The [place] [facility] providers would like to encourage you to use [facility] to communicate with providers for non-urgent requests or questions.  Due to long hold times on the telephone, sending your provider a message by [facility] may be a faster and more efficient way to get a response.  Please allow 48 business hours for a response.  Please remember that this is for non-urgent requests.  _______________________________________________________  Your provider has requested that you go to the basement level for lab work before leaving today. Press "B" on the elevator. The lab is located at the first door on the left as you exit the elevator.   Due to recent changes in healthcare laws, you may see the results of your imaging and laboratory studies on MyChart before your provider has had a chance to review them.  We understand that in some cases there may be results that are confusing or concerning to you. Not all laboratory results come back in the same time frame and the provider may be waiting for multiple results in order to interpret others.  Please give [place] 48 hours in order for your provider to  thoroughly review all the results before contacting the office for clarification of your results.    It was a pleasure to see you today!  Thank you for trusting me with your gastrointestinal care!

## 2022-07-15 NOTE — Progress Notes (Signed)
Subjective:    Patient ID: Sarah Shah, female    DOB: 10/08/2000, 22 y.o.   MRN: [id number]  HPI  Sarah Shah is a pleasant 22 year old white female, established with Dr. Marina Goodell.  She was last seen in the office in 2023 and did undergo EGD and colonoscopy in February 2023 both of which were normal.  She does have history of chronic GERD, and at EGD in 2021 she had a normal-appearing esophagus but did have some mild active gastritis without H. pylori.  Biopsies also showed some mild increase in eosinophils from the esophageal biopsies most consistent with reflux esophagitis. Patient comes in today for routine follow-up and med refill. She had undergone laparoscopic cholecystectomy in March 2024 and says that she did well with that and has actually had some decrease in reflux symptoms since then. She also recently just started back on Mounjaro, hopes to lose some weight, and says she has been being careful about her diet by avoiding sauces, barbecue and most greasy foods which tend to aggravate her symptoms. She has been on omeprazole 40 mg p.o. every morning for 4 to 5 years and says that this control her symptoms well.  She is not having any nocturnal symptoms, generally does not have any heartburn or indigestion during the daytime unless she has dietary indiscretion.  No dysphagia or odynophagia. Other medical problems include PCOS, ADHD, obesity, history of depression and autism spectrum disorder.   Review of Systems Pertinent positive and negative review of systems were noted in the above HPI section.  All other review of systems was otherwise negative.   Outpatient Encounter Medications as of 07/15/2022  Medication Sig   amphetamine-dextroamphetamine (ADDERALL XR) 30 MG 24 hr capsule Take 30 mg by mouth every morning.   busPIRone (BUSPAR) 30 MG tablet Take 30 mg by mouth 2 (two) times daily.   cloNIDine (CATAPRES) 0.2 MG tablet Take 0.2 mg by mouth 2 (two) times daily.   dicyclomine (BENTYL)  20 MG tablet Take 1 tablet (20 mg total) by mouth every 6 (six) hours. (Patient taking differently: Take 20 mg by mouth every 6 (six) hours as needed (stomach pain).)   docusate sodium (COLACE) 100 MG capsule Take 100 mg by mouth at bedtime.   ferrous sulfate 325 (65 FE) MG tablet Take 325 mg by mouth at bedtime.   tirzepatide [facility]) 2.5 MG/0.5ML Pen Inject 2.5 mg into the skin once a week.   Topiramate ER (TROKENDI XR) 50 MG CP24 Take 1 capsule by mouth daily.   vitamin C (ASCORBIC ACID) 500 MG tablet Take 500 mg by mouth at bedtime.   VOLNEA 0.15-0.02/0.01 MG (21/5) tablet TAKE 1 TABLET BY MOUTH EVERY DAY. TAKE ACTIVE TABLETS ONLY, SKIP PLACEBO TABLETS.   [DISCONTINUED] omeprazole (PRILOSEC) 40 MG capsule Take 1 capsule (40 mg total) by mouth daily.   omeprazole (PRILOSEC) 40 MG capsule Take 1 capsule (40 mg total) by mouth daily.   [DISCONTINUED] cloNIDine HCl (KAPVAY) 0.1 MG TB12 ER tablet Take 2 tablets (0.2 mg total) by mouth 2 (two) times daily. Take 2 tablets by mouth 2 times daily (Patient taking differently: Take 0.2 mg by mouth 2 (two) times daily.)   [DISCONTINUED] FLUoxetine (PROZAC) 40 MG capsule Take 1 capsule (40 mg total) by mouth daily.   No facility-administered encounter medications on file as of 07/15/2022.   Allergies  Allergen Reactions   Wound Dressing Adhesive Itching    Itchy and irritated.   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   Iron deficiency 04/25/2020   Learning disability    GERD (gastroesophageal reflux disease) 07/20/2019   Vitamin D deficiency 07/18/2019   Severe episode of recurrent major depressive disorder, 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/25/2019   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   PCOS (polycystic ovarian syndrome) 10/05/2016   Flat feet, bilateral 12/19/2015   Insulin resistance 12/03/2015   Class 3 severe obesity with serious comorbidity and body mass index (BMI) of 50.0 to 59.9 in adult (HCC) 08/27/2014   Autism spectrum disorder    DEVELOPMENTAL DELAY 11/29/2006   Developmental delay 11/29/2006   Attention deficit hyperactivity disorder (ADHD), combined type 05/13/2006   Social History   Socioeconomic History   Marital status: Single    Spouse name: Not on file   Number of children: 0   Years of education: Not on file   Highest education level: 12th grade  Occupational History   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       07/20/19   Enjoys: sleep, paints, sewing (sock dolls), animal care   From: born in [place] but moved her when she was little   Who is at home: with mom Stanton Kidney, stepdad Thayer Ohm, and sister [place]   Pets: loves her [place] pigs, Careers information officer, ducks, dogs, classroom bunny   School: [employer]    Grade: 11th grade this fall      Family: good relationship with mom and family       Exercise: walking with mom    Diet: avoiding reflux worsening food      Safety   Seat belts: Yes    Guns: Yes  and secure   Safe in relationships: Yes    Helmets: Yes    Smoke Exposure at home: No    Bullying: Yes  and knows who she can ask for help and feels   Social Determinants of Health   Financial Resource Strain: Low Risk  (05/28/2022)   Overall Financial Resource Strain (CARDIA)    Difficulty of Paying Living Expenses: Not hard at all  Food Insecurity: No Food Insecurity (06/16/2022)   Hunger Vital Sign    Worried About Running Out of Food in the Last Year: Never true    Ran Out of Food in the Last Year: Never true  Transportation Needs: No Transportation Needs (05/28/2022)   PRAPARE - Administrator, Civil Service (Medical): No    Lack of Transportation (Non-Medical): No  Physical Activity: Insufficiently Active (05/28/2022)   Exercise Vital Sign    Days of Exercise per Week: 3 days  Minutes of Exercise per Session: 20 min  Stress: Stress Concern Present (05/28/2022)   [employer] of Occupational Health - Occupational Stress Questionnaire    Feeling of Stress : Rather much  Social Connections: Moderately Isolated (05/28/2022)   Social Connection and Isolation Panel [NHANES]    Frequency of Communication with Friends and Family: More than three times a week    Frequency of Social Gatherings with Friends and Family: Once a week    Attends Religious Services: More than 4 times per year    Active Member of [employer] or Organizations: No    Attends Engineer, structural: Not on file    Marital Status: Never married  Intimate Partner Violence: Not on file    Sarah Shah's family history includes ADD / ADHD in her sister; Anxiety disorder in her father, mother, and sister; Atrial fibrillation in her maternal grandmother; Breast cancer in her maternal aunt; Colon polyps in her mother; Depression in her father; Diabetes in her father, maternal grandmother, and mother; Heart failure in her maternal grandmother; Hypertension in her maternal grandfather and mother; Iron deficiency in her maternal aunt; Irritable bowel syndrome in her mother; Obesity in her father  and mother; Other in her mother and sister; Stroke in her mother.      Objective:    Vitals:   07/15/22 1011  BP: 100/60  Pulse: (!) 105    Physical Exam. Well-developed well-nourished obese young white female in no acute distress.  Pleasant.  Height, Weight, 319 BMI 53.1  HEENT; nontraumatic normocephalic, EOMI, PE R LA, sclera anicteric. Oropharynx; not examined today Neck; supple, no JVD Cardiovascular; regular rate and rhythm with S1-S2, no murmur rub or gallop Pulmonary; Clear bilaterally Abdomen; soft, obese,, nondistended, no palpable mass or hepatosplenomegaly, bowel sounds are active, cholecystectomy incisional port scars healed Rectal; not done today Skin; benign exam, no jaundice rash or appreciable lesions Extremities; no clubbing cyanosis or edema skin warm and dry Neuro/Psych; alert and oriented x4, grossly nonfocal mood and affect appropriate        Assessment & Plan:   #22 22 year old white female with history of chronic GERD, here for routine follow-up and medication refill, doing well on omeprazole 40 mg p.o. every morning AC breakfast. She had normal EGD in February 2023  #2 status post laparoscopic cholecystectomy March 2024 #3.  PCOS #4.  Obesity #5.  ADHD, history of depression and autism spectrum disorder.  Plan; continue antireflux regimen, we discussed this today and she was given an antireflux diet and instruction sheet. Refill omeprazole 40 mg p.o. every morning #90 and 4 refills With long-term PPI use will check magnesium level today, and we discussed that she should have a bone scan probably starting age 76 The future we can consider decreasing the dose of omeprazole to 20 mg p.o. every morning especially if she is able to maintain a strict antireflux regimen and is able to lose weight.  We will discuss that at her next appointment further.  Follow-up in 1 year with Dr. Marina Goodell or myself and sooner as needed.  Mery Guadalupe S Tarvares Lant  PA-C 07/15/2022   Cc: Mort Sawyers, FNP

## 2022-07-15 NOTE — Progress Notes (Signed)
Noted  

## 2022-07-17 ENCOUNTER — Encounter (INDEPENDENT_AMBULATORY_CARE_PROVIDER_SITE_OTHER): Payer: Self-pay

## 2022-07-18 ENCOUNTER — Encounter: Payer: Self-pay | Admitting: Physician Assistant

## 2022-07-21 ENCOUNTER — Ambulatory Visit (INDEPENDENT_AMBULATORY_CARE_PROVIDER_SITE_OTHER): Admitting: Clinical

## 2022-07-21 ENCOUNTER — Telehealth (INDEPENDENT_AMBULATORY_CARE_PROVIDER_SITE_OTHER): Admitting: Pediatric Endocrinology

## 2022-07-21 ENCOUNTER — Encounter (INDEPENDENT_AMBULATORY_CARE_PROVIDER_SITE_OTHER): Payer: Self-pay | Admitting: Pediatric Endocrinology

## 2022-07-21 DIAGNOSIS — E282 Polycystic ovarian syndrome: Secondary | ICD-10-CM | POA: Diagnosis not present

## 2022-07-21 DIAGNOSIS — F419 Anxiety disorder, unspecified: Secondary | ICD-10-CM | POA: Diagnosis not present

## 2022-07-21 DIAGNOSIS — R6250 Unspecified lack of expected normal physiological development in childhood: Secondary | ICD-10-CM | POA: Diagnosis not present

## 2022-07-21 DIAGNOSIS — F32A Depression, unspecified: Secondary | ICD-10-CM | POA: Diagnosis not present

## 2022-07-21 DIAGNOSIS — F840 Autistic disorder: Secondary | ICD-10-CM | POA: Diagnosis not present

## 2022-07-21 DIAGNOSIS — E669 Obesity, unspecified: Secondary | ICD-10-CM

## 2022-07-21 DIAGNOSIS — E8881 Metabolic syndrome: Secondary | ICD-10-CM

## 2022-07-21 NOTE — Progress Notes (Signed)
[facility] Counselor/Therapist Progress Note  Patient ID: Sarah Shah, MRN: [id number]    Date: 07/21/22  Time Spent: 9:00 am - 9:50 am: 50 Minutes  Type of Service Provided Individual Therapy  Type of Contact virtual (via Caregility with real time audio and visual interaction)  Patient Location: home       Provider Location: office  Sarah Shah participated from home, via video, and consented to treatment. Therapist participated from office.  Patient consented to telehealth therapy and is aware of and consented to the limitations of telehealth.   Mental Status Exam: Appearance:  Casual and Fairly Groomed     Behavior: Sharing  Motor: Normal  Speech/Language:  Clear and Coherent and Normal Rate  Affect: Appropriate  Mood: normal  Thought process: normal  Thought content:   WNL  Sensory/Perceptual disturbances:   WNL  Orientation: oriented to person, place, time/date, and situation  Attention: Good  Concentration: Good  Memory: WNL  Fund of knowledge:  Fair  Insight:   Fair  Judgment:  Fair  Impulse Control: Fair   Risk Assessment: No apparent indicators of SI or HI during the visit   Presenting Problems, Reported Symptoms, and /or Interim History: Sarah Shah presented for a session to address anxiety and life stress. Sarah Shah's mother briefly joined the conversation towards the end as well.   Subjective: Sarah Shah presented for an individual outpatient therapy session. The following was addressed during sessions.   Sarah Shah rated her mood as a 5 out of 10 (with 1 being sad, 5 being neutral, and 10 being happy).  Sarah Shah rated her anxiety as a 4 out of 10 and irritation/frustration as a 0-1 out of 10 (with 10 being high). She is leaving for an 8 day trip tomorrow. Stressors and anxiety associated with this were discussed and planned for/problem solved (e.g., how to manage anxiety in the security line in the airport, how to manage feeling overwhelmed with family,  etc.). Sarah Shah reported that she switched medications, which has been helpful for her depression. She identified the earliest signs that her mood was getting worse (i.e., not wanting to get out of bed and irritability). How to monitor for these signs in the future was discussed. She reported that she has been doing well with her chores and has been spending less, as well as feeling less frustrated about spending limitations. Her mother joined the session and indicated that Sarah Shah had an angry outburst yesterday, which was discussed and problem solved. Mother agreed that Sarah Shah's irritability and mood have improved on her new medication.   Interventions/Psychotherapy Techniques Used During Session: Cognitive Behavioral Therapy, executive functioning strategies   Diagnosis: Autism spectrum disorder  Anxiety  Depression, unspecified depression type  MENTAL HEALTH INTERVENTIONS USED DURING TREATMENT & PATIENT'S RESPONSE TO INTERVENTIONS:  Short-term Objective addressed today: Sarah Shah will be able to use appropriate coping strategies including helpful thoughts to maintain a low level of anxiety and depressive symptoms. Mental health techniques used: Objective was addressed in session through the use of Cognitive Behavioral Therapy and discussion. Sarah Shah's response was positive. Progress Toward Goal: progressing  Short-term Objective addressed today:Sarah Shah will be able to create a plan to address weaknesses described in objective 3A.  AND Sarah Shah will be able to implement steps on the plan developed in objective 3B. Mental health techniques used: Objective was addressed in session through the use of Solution-Oriented/Positive Psychology and life skills and executive functioning, and discussion. Sarah Shah's response was positive. Progress Toward Goal: progressing    PLAN  1. Sarah Shah will return for a therapy session.   2. Homework Given:  monitor for anxiety/feeling overwhelmed while with family and implement  mood/stress management strategies. This homework will be reviewed with Sarah Shah at the next visit.  3. 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     Sarah Derby, [name]  Individual Treatment Plan  - please see the notes from 04/17/2021 & 05/01/2021 for the initial treatment plan, the updates in Winter 2024, and the goal review from 06/02/2022 for complete treatment plan information.    Problem/Need: Sarah Shah has a history of anxiety and mood concerns - with some ongoing challenges in these areas  Long-Term Goal #1: Sarah Shah will be able to identify and address any mood or anxiety challenges and will maintain a low level of anxiety and depression symptoms as evidenced by self-report. Short-Term Objectives: Objective 1A: Sarah Shah will be able to rate her mood and anxiety - GOAL MET  Objective 1B: Sarah Shah will be able to identify any negative thinking patterns that are perpetuating anxiety or depressive symptoms and replace these with more adaptive thoughts. - GOAL Partially met  Objective 1C: Sarah Shah will be able to use appropriate coping strategies including helpful thoughts to maintain a low level of anxiety and depressive symptoms. Interventions: Cognitive Behavioral Therapy, Psychologist, occupational, Motivational Interviewing, and Psycho-education/Bibliotherapy  coping skills and other evidenced-based practices will be used to promote progress towards healthy functioning and to help manage decrease symptoms associated with their diagnosis.  Treatment Regimen: Individual skill building sessions every 2-4 weeks to address treatment goal/objective Target Date: 05/2023 Responsible Party: therapist and patient Person delivering treatment: Licensed Psychologist Sarah Derby, [name] will support the patient's ability to achieve the goals identified. Resolved:  No - Sarah Shah has made some progress, but mood and anxiety continue to be variable - goal continued    Problem/Need: Life transition - Sarah Shah is in the process of completing a major life transition including graduating from high school, starting college, and potentially working. - As of 06/02/2022 Ameerah has graduated from high school and successfully completed 2 semesters of college. However she continues to have life transitions, so the problem/need section has been updated  Updated Problem/Need: Life transition - Natonia has continuing life transitions including working towards completing her college program and getting a job.  Long-Term Goal #2: Donyea will successfully navigate these life transitions including being able to manage her stress level. Short-Term Objectives: Objective 2A: Kathryn will be able to identify stressors associated with the life transitions - GOAL MET  - stressors include time management, test taking, and expanding her social circle  Objective 2B: Seva will be able to identify strategies to help navigate stressors associated with the life transitions and implement them  Interventions: Assertiveness/Communication, Psychologist, occupational, and Psycho-education/Bibliotherapy , and other evidenced-based practices will be used to promote progress towards healthy functioning and to help manage decrease symptoms associated with their diagnosis.  Treatment Regimen: Individual skill building sessions every 2-4 weeks to address treatment goal/objective Target Date: 05/2023 Responsible Party: therapist and patient Person delivering treatment: Licensed Psychologist Sarah Derby, [name] will support the patient's ability to achieve the goals identified. Resolved:  No  Problem/Need: Life skills - Levon needs to develop some additional skills to facilitate independent living. Long-Term Goal #3: Siniyah will identify the additional life skills necessary to facilitate independent living. Short-Term Objectives: Objective 3A: Nazariah will be able to identify areas that she needs to address to  increase the likelihood of her being able to successfully live independently.  - GOAL MET  - areas identified so far (as of 06/02/2022) include money management, cooking, and transportation   Objective 3B: Aster will be able to create a plan to address weaknesses described in objective 3A.   Objective 3C: Vernella will be able to implement steps on the plan developed in objective 3B. Interventions: Motivational Interviewing, Solution-Oriented/Positive Psychology, and Psycho-education/Bibliotherapy  daily life skills, and other evidenced-based practices will be used to promote progress towards healthy functioning and to help manage decrease symptoms associated with their diagnosis.  Treatment Regimen: Individual skill building sessions every 2-4 weeks to address treatment goal/objective Target Date: 05/2023 Responsible Party: therapist and patient Person delivering treatment: Licensed Psychologist Sarah Derby, [name] will support the patient's ability to achieve the goals identified. Resolved:  No - Craig has made some strides towards identifying areas that need to be addressed to facilitate independent living and made some progress on developing those skills, but process continues   Sarah Derby, [name]

## 2022-07-21 NOTE — Progress Notes (Signed)
Is the patient/family in a moving vehicle? If yes, please ask family to pull over and park in a safe place to continue the visit.  This is a Pediatric Specialist E-Visit consult/follow up provided via My Chart Video Visit (Caregility). Sarah Shah consented to an E-Visit consult today.  Is the patient present for the video visit? Yes Location of patient: Sarah Shah is at  Is the patient located in the state of [place]? Yes Location of provider: Dessa Phi,  MD is at  Patient was referred by Mort Sawyers, FNP   The following participants were involved in this E-Visit: Sarah Shah, pt; Sarah Shah, CMA; Dessa Phi, MD.  This visit was done via VIDEO   Chief Complain/ Reason for E-Visit today: Morbid Obesity Total time on call: 21 min Follow up: Adult Endocrinology      Subjective:  Subjective  Patient Name: Sarah Shah Date of Birth: Jun 04, 2000  MRN: [id number]  Sarah Shah  presents to the office today for follow up evaluation and management of her insulin resistance, morbid obesity, and PCOS  HISTORY OF PRESENT ILLNESS:   Sarah Shah is a 22 y.o. Caucasian female   Sarah Shah was accompanied by her mother (in lobby)  1. Sarah Shah was seen by her PCP in the fall of 2017 for medication adjustment at age 22. She was noted to have ongoing rapid weight gain. 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 She was referred to endocrinology for further evaluation of rapid weight gain.   2. Sarah Shah was last seen in Pediatric Endocrine clinic on 06/11/22.  In the interim she has been doing generally well.    She has continued on Kariva for menstrual suppression. She did have a period last month. She thinks that this was due to taking Ozempic the month prior.   She is now taking Mounjaro again. She was able to tolerate the 2.5 mg dose. She took her first dose of 5 mg yesterday. She feels better today than she expected to.   She is no longer having  issues with nausea or abdominal pain. She does have some spasm like pain in her lower abdomen after eating chocolate or fatty foods.   She has been able to go to the gym at [facility] more often.   She is seeing Dr. Alphonzo Severance at [facility] for psychology She is also seeing Dr. Tonna Corner at [facility] for med management.  She had an episode of cutting in December. No cutting since.   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  She has 2 ducks, 4 [place] pigs, 1 rabbit, 2 dogs. She is interested in getting a service dog.   3. Pertinent Review of Systems:  Constitutional: The patient feels "tired". The patient seems healthy.   Eyes: Vision seems to be good. There are no recognized eye problems. Wears glasses.   Neck: The patient has no complaints of anterior neck swelling, soreness, tenderness, pressure, discomfort, or difficulty swallowing.   Heart: Heart rate increases with exercise or other physical activity. The patient has no complaints of palpitations, irregular heart beats, chest pain, or chest pressure.   Lungs: no asthma or wheezing Gastrointestinal: Bowel movents seem normal. The patient has no complaints of excessive hunger, acid reflux, upset stomach, stomach aches or pains, diarrhea, or constipation.   Legs: Muscle mass and strength seem normal. There are no complaints of numbness, tingling, burning, or pain. No edema is noted.  Feet: Flat feet- complaining of pain with walking. Neurologic: There are no recognized problems with muscle movement and strength, sensation, or coordination. GYN/GU: Secondary amenorrhea. LMP was about a year ago. On Kariva for menstrual suppression.   PAST MEDICAL, FAMILY, AND SOCIAL HISTORY  Past Medical History:  Diagnosis Date   ADHD (attention deficit hyperactivity disorder)    Anemia    Anxiety    Autism spectrum    Constipated    Constipation    Depression    Development delay    GERD  (gastroesophageal reflux disease)    Headache    Insulin resistance    Iron deficiency    Learning disability    Obesity    ODD (oppositional defiant disorder)    PCOS (polycystic ovarian syndrome)    PMDD (premenstrual dysphoric disorder)    Pneumonia    3 mos old and 30 mos old   Visual acuity reduced    glasses    Family History  Problem Relation Age of Onset   Diabetes Mother    Hypertension Mother    Anxiety disorder Mother    Other Mother        Premenstrual dysphoic disorder   Obesity Mother    Stroke Mother    Colon polyps Mother    Irritable bowel syndrome Mother    Diabetes Father        type 1   Depression Father    Anxiety disorder Father    Obesity Father    Anxiety disorder Sister    ADD / ADHD Sister        ADD   Other Sister        Premenstrual dysphoric disorder   Atrial fibrillation Maternal Grandmother    Diabetes Maternal Grandmother    Heart failure Maternal Grandmother    Hypertension Maternal Grandfather    Iron deficiency Maternal Aunt    Breast cancer Maternal Aunt      Current Outpatient Medications:    amphetamine-dextroamphetamine (ADDERALL XR) 30 MG 24 hr capsule, Take 30 mg by mouth every morning., Disp: , Rfl:    busPIRone (BUSPAR) 30 MG tablet, Take 30 mg by mouth 2 (two) times daily., Disp: , Rfl:    cloNIDine (CATAPRES) 0.2 MG tablet, Take 0.2 mg by mouth 2 (two) times daily., Disp: , Rfl:    dicyclomine (BENTYL) 20 MG tablet, Take 1 tablet (20 mg total) by mouth every 6 (six) hours. (Patient taking differently: Take 20 mg by mouth every 6 (six) hours as needed (stomach pain).), Disp: 60 tablet, Rfl: 0   docusate sodium (COLACE) 100 MG capsule, Take 100 mg by mouth at bedtime., Disp: , Rfl:    ferrous sulfate 325 (65 FE) MG tablet, Take 325 mg by mouth at bedtime., Disp: , Rfl:    omeprazole (PRILOSEC) 40 MG capsule, Take 1 capsule (40 mg total) by mouth daily., Disp: 90 capsule, Rfl: 4   tirzepatide (MOUNJARO) 2.5 MG/0.5ML Pen,  Inject 2.5 mg into the skin once a week., Disp: 2 mL, Rfl: 5   Topiramate ER (TROKENDI XR) 50 MG CP24, Take 1 capsule by mouth daily., Disp: 30 capsule, Rfl: 1   venlafaxine XR (EFFEXOR-XR) 150 MG 24 hr capsule, Take 150 mg by mouth daily., Disp: , Rfl:    vitamin C (ASCORBIC ACID) 500 MG tablet, Take 500 mg by mouth at bedtime., Disp: , Rfl:    VOLNEA 0.15-0.02/0.01 MG (21/5) tablet, TAKE 1 TABLET BY MOUTH EVERY DAY. TAKE ACTIVE TABLETS ONLY, SKIP  PLACEBO TABLETS., Disp: 140 tablet, Rfl: 3  Allergies as of 07/21/2022 - Review Complete 07/21/2022  Allergen Reaction Noted   Wound dressing adhesive Itching 06/11/2022     reports that she has never smoked. She has never used smokeless tobacco. She reports that she does not drink alcohol and does not use drugs. Pediatric History  Patient Parents   Milinda Hirschfeld (Mother)   Other Topics Concern   Not on file  Social History Narrative   Will start 12th grade at [employer] for the 22/23 school year.       07/20/19   Enjoys: sleep, paints, sewing (sock dolls), animal care   From: born in [place] but moved her when she was little   Who is at home: with mom Stanton Kidney, stepdad Thayer Ohm, and sister [place]   Pets: loves her [place] pigs, Careers information officer, ducks, dogs, classroom bunny   School: [employer]    Grade: 11th grade this fall      Family: good relationship with mom and family       Exercise: walking with mom    Diet: avoiding reflux worsening food      Safety   Seat belts: Yes    Guns: Yes  and secure   Safe in relationships: Yes    Helmets: Yes    Smoke Exposure at home: No   Bullying: Yes  and knows who she can ask for help and feels    1. School and Family:  Taking classes at [employer]. 2. Activities:  3. Primary Care Provider: Mort Sawyers, FNP  ROS: There are no other significant problems involving Sarah Shah's other body systems.    Objective:  Objective  Vital Signs:       03/05/22 09:32  BP 126/70  Pulse Rate 100   Weight 309 lb 6.4 oz (H)  (H): Data is abnormally high  LMP 06/15/2022 (Approximate)   Growth %ile SmartLinks can only be used for patients less than 21 years old.  Ht Readings from Last 3 Encounters:  07/15/22 5\' 5"  (1.651 m)  05/28/22 5\' 5"  (1.651 m)  04/10/22 5\' 5"  (1.651 m)   Wt Readings from Last 3 Encounters:  07/15/22 (!) 319 lb 3.2 oz (144.8 kg)  06/11/22 (!) 325 lb 9.6 oz (147.7 kg)  05/28/22 (!) 323 lb 3.2 oz (146.6 kg)   HC Readings from Last 3 Encounters:  No data found for HC   There is no height or weight on file to calculate BSA. Facility age limit for growth %iles is 20 years. Facility age limit for growth %iles is 20 years.    PHYSICAL EXAM:    Physical Exam Constitutional:      Appearance: She is obese.  HENT:     Head: Normocephalic.  Eyes:     Extraocular Movements: Extraocular movements intact.  Pulmonary:     Effort: Pulmonary effort is normal.  Musculoskeletal:        General: Normal range of motion.     Cervical back: Normal range of motion.  Neurological:     General: No focal deficit present.     Mental Status: She is alert.  Psychiatric:        Mood and Affect: Mood normal.        Behavior: Behavior normal.      LAB DATA:     Lab Results  Component Value Date   HGBA1C 5.1 06/11/2022   HGBA1C 5.0 03/05/2022   HGBA1C 5.3 12/01/2021   HGBA1C 4.9 04/16/2021  HGBA1C 4.7 11/28/2020   HGBA1C 5.1 07/25/2020   HGBA1C 5.5 03/25/2020   HGBA1C 5.2 03/12/2020    Results for orders placed or performed in visit on 07/15/22  Iron and Iron Binding Capacity ([facility] only)  Result Value Ref Range   Iron 53 28 - 170 ug/dL   TIBC 161 096 - 045 ug/dL   Saturation Ratios 15 10.4 - 31.8 %   UIBC 308 148 - 442 ug/dL  Ferritin  Result Value Ref Range   Ferritin 350 (H) 11 - 307 ng/mL  CBC with Differential (Cancer Center Only)  Result Value Ref Range   WBC Count 7.7 4.0 - 10.5 K/uL   RBC 5.01 3.87 - 5.11 MIL/uL   Hemoglobin 15.0 12.0  - 15.0 g/dL   HCT 40.9 81.1 - 91.4 %   MCV 84.4 80.0 - 100.0 fL   MCH 29.9 26.0 - 34.0 pg   MCHC 35.5 30.0 - 36.0 g/dL   RDW 78.2 95.6 - 21.3 %   Platelet Count 321 150 - 400 K/uL   nRBC 0.0 0.0 - 0.2 %   Neutrophils Relative % 57 %   Neutro Abs 4.4 1.7 - 7.7 K/uL   Lymphocytes Relative 34 %   Lymphs Abs 2.6 0.7 - 4.0 K/uL   Monocytes Relative 7 %   Monocytes Absolute 0.5 0.1 - 1.0 K/uL   Eosinophils Relative 1 %   Eosinophils Absolute 0.1 0.0 - 0.5 K/uL   Basophils Relative 1 %   Basophils Absolute 0.0 0.0 - 0.1 K/uL   Immature Granulocytes 0 %   Abs Immature Granulocytes 0.02 0.00 - 0.07 K/uL       Assessment and Plan:  Assessment  ASSESSMENT: Sarah Shah is a 22 y.o. Caucasian female with autism, developmental delay, anger/behavior concerns and evidence of insulin resistance/PCOS associated with obesity.    Insulin resistance/hyperphagia/Metabolic Syndrome  - She was able to restart her Mounjaro and was able to tolerate the 2.5 mg dose - She took 5 mg of Mounjaro for the first time yesterday.  - She is feeling good on this dose. She is feeling "less bad" than expected - She has seen a reduction in appetite.  - She has not yet seen a change in body shape    Obesity - Class 3 obesity - BMI >50 - Has failed a variety of oral weight loss medications - Previously had good weight loss on Mounjaro - Has tolerated restarting Mounjaro.   PCOS - On Kariva (generic) OCP.  - Now doing continuous cycling without breakthrough bleeding - She is much happier and less snarky/depressed/irritable - She had a period last month when she was taking Ozempic. She has not had one since switching back to [facility]  - androgen levels have improved - Iron level is currently good and she did not need an iron infusion this quarter.    PLAN:    1. Diagnostic:  Lab Orders  No laboratory test(s) ordered today    2. Therapeutic: Reviewed lifestyle goals.  No orders of the defined types were  placed in this encounter. Mounjaro 5 mg once a week May increase to 7.5 mg this fall.   3. Patient education: Discussion of the above. Also discussed that I will be leaving [facility] this fall. If she wants to increase to 7.5 mg Mounjaro she needs to let me know by mid September.  4. Follow-up: Return for Referred to adult endocrinology.      Dessa Phi, MD  >30 minutes spent today  reviewing the medical chart, counseling the patient/family, and documenting today's encounter.

## 2022-07-22 ENCOUNTER — Inpatient Hospital Stay (HOSPITAL_BASED_OUTPATIENT_CLINIC_OR_DEPARTMENT_OTHER): Admitting: Physician Assistant

## 2022-07-22 DIAGNOSIS — Z803 Family history of malignant neoplasm of breast: Secondary | ICD-10-CM | POA: Diagnosis not present

## 2022-07-22 DIAGNOSIS — E611 Iron deficiency: Secondary | ICD-10-CM

## 2022-07-22 DIAGNOSIS — R109 Unspecified abdominal pain: Secondary | ICD-10-CM | POA: Diagnosis not present

## 2022-07-22 NOTE — Progress Notes (Signed)
[facility] Telephone:([phone]   [phone]   I connected with Sarah Shah  on 07/22/22 by telephone visit and verified that I am speaking with the correct person using two identifiers.   I discussed the limitations, risks, security and privacy concerns of performing an evaluation and management service by telemedicine and the availability of in-person appointments. I also discussed with the patient that there may be a patient responsible charge related to this service. The patient expressed understanding and agreed to proceed.  Patient's location: Home Provider's location: Office  PROGRESS NOTE  Patient Care Team: Mort Sawyers, FNP as PCP - General (Family Medicine)  CHIEF COMPLAINTS/PURPOSE OF CONSULTATION:  "Iron deficiency "  CURRENT THERAPY: Ferrous sulfate 325 mg once daily IV Feraheme as needed. Last dose on 12/08/2021.   HISTORY OF PRESENTING ILLNESS:  Sarah Shah 22 y.o. female who returns for a telephone visit for a follow up for iron deficiency.  On exam today, Sarah Shah's mother reports that she does have some fatigue but has been trying to stay active. She does have some days where she is more exhausted than usual. She reports irregular menstrual cycles with some months not having a cycle. She takes ferrous sulfate once every other day. Her abdominal pain has resolved since undergoing cholecystectomy in March. She takes stool softener to minimize constipation.  She is not having any other overt signs of bleeding.  She denies any fevers, chills, night sweats, shortness of breath, chest pain or cough.  She has no other complaints.  Rest the 10 point ROS is below  MEDICAL HISTORY:  Past Medical History:  Diagnosis Date   ADHD (attention deficit hyperactivity disorder)    Anemia    Anxiety    Autism spectrum    Constipated    Constipation    Depression    Development delay    GERD (gastroesophageal reflux disease)     Headache    Insulin resistance    Iron deficiency    Learning disability    Obesity    ODD (oppositional defiant disorder)    PCOS (polycystic ovarian syndrome)    PMDD (premenstrual dysphoric disorder)    Pneumonia    3 mos old and 73 mos old   Visual acuity reduced    glasses    SURGICAL HISTORY: Past Surgical History:  Procedure Laterality Date   BIOPSY  02/17/2021   Procedure: BIOPSY;  Surgeon: Hilarie Fredrickson, MD;  Location: [facility];  Service: Endoscopy;;   CHOLECYSTECTOMY  09/17/2021   COLONOSCOPY WITH PROPOFOL N/A 02/17/2021   Procedure: COLONOSCOPY WITH PROPOFOL;  Surgeon: Hilarie Fredrickson, MD;  Location: [facility];  Service: Endoscopy;  Laterality: N/A;   ESOPHAGOGASTRODUODENOSCOPY (EGD) WITH PROPOFOL N/A 02/17/2021   Procedure: ESOPHAGOGASTRODUODENOSCOPY (EGD) WITH PROPOFOL;  Surgeon: Hilarie Fredrickson, MD;  Location: [facility];  Service: Endoscopy;  Laterality: N/A;   TOOTH EXTRACTION N/A 01/03/2018   Procedure: SURGICAL EXTRACTION OF TEETH #1, 16, 17, 32;  Surgeon: Vivia Ewing, DMD;  Location: [facility] OR;  Service: Oral Surgery;  Laterality: N/A;    SOCIAL HISTORY: Social History   Socioeconomic History   Marital status: Single    Spouse name: Not on file   Number of children: 0   Years of education: Not on file   Highest education level: 12th grade  Occupational History   Occupation: student  Tobacco Use   Smoking status: Never   Smokeless tobacco: Never  Vaping Use   Vaping Use:  Never used  Substance and Sexual Activity   Alcohol use: No   Drug use: Never   Sexual activity: Never    Birth control/protection: Pill  Other Topics Concern   Not on file  Social History Narrative   Will start 12th grade at [employer] for the 22/23 school year.       07/20/19   Enjoys: sleep, paints, sewing (sock dolls), animal care   From: born in [place] but moved her when she was little   Who is at home: with mom Sarah Shah, stepdad Thayer Ohm, and sister [place]   Pets:  loves her [place] pigs, Careers information officer, ducks, dogs, classroom bunny   School: [employer]    Grade: 11th grade this fall      Family: good relationship with mom and family       Exercise: walking with mom    Diet: avoiding reflux worsening food      Safety   Seat belts: Yes    Guns: Yes  and secure   Safe in relationships: Yes    Helmets: Yes    Smoke Exposure at home: No   Bullying: Yes  and knows who she can ask for help and feels   Social Determinants of Health   Financial Resource Strain: Low Risk  (05/28/2022)   Overall Financial Resource Strain (CARDIA)    Difficulty of Paying Living Expenses: Not hard at all  Food Insecurity: No Food Insecurity (06/16/2022)   Hunger Vital Sign    Worried About Running Out of Food in the Last Year: Never true    Ran Out of Food in the Last Year: Never true  Transportation Needs: No Transportation Needs (05/28/2022)   PRAPARE - Administrator, Civil Service (Medical): No    Lack of Transportation (Non-Medical): No  Physical Activity: Insufficiently Active (05/28/2022)   Exercise Vital Sign    Days of Exercise per Week: 3 days    Minutes of Exercise per Session: 20 min  Stress: Stress Concern Present (05/28/2022)   [employer] of Occupational Health - Occupational Stress Questionnaire    Feeling of Stress : Rather much  Social Connections: Moderately Isolated (05/28/2022)   Social Connection and Isolation Panel [NHANES]    Frequency of Communication with Friends and Family: More than three times a week    Frequency of Social Gatherings with Friends and Family: Once a week    Attends Religious Services: More than 4 times per year    Active Member of [employer] or Organizations: No    Attends Engineer, structural: Not on file    Marital Status: Never married  Catering manager Violence: Not on file    FAMILY HISTORY: Family History  Problem Relation Age of Onset   Diabetes Mother    Hypertension Mother    Anxiety  disorder Mother    Other Mother        Premenstrual dysphoic disorder   Obesity Mother    Stroke Mother    Colon polyps Mother    Irritable bowel syndrome Mother    Diabetes Father        type 1   Depression Father    Anxiety disorder Father    Obesity Father    Anxiety disorder Sister    ADD / ADHD Sister        ADD   Other Sister        Premenstrual dysphoric disorder   Atrial fibrillation Maternal Grandmother    Diabetes Maternal  Grandmother    Heart failure Maternal Grandmother    Hypertension Maternal Grandfather    Iron deficiency Maternal Aunt    Breast cancer Maternal Aunt     ALLERGIES:  is allergic to wound dressing adhesive.  MEDICATIONS:  Current Outpatient Medications  Medication Sig Dispense Refill   amphetamine-dextroamphetamine (ADDERALL XR) 30 MG 24 hr capsule Take 30 mg by mouth every morning.     busPIRone (BUSPAR) 30 MG tablet Take 30 mg by mouth 2 (two) times daily.     cloNIDine (CATAPRES) 0.2 MG tablet Take 0.2 mg by mouth 2 (two) times daily.     dicyclomine (BENTYL) 20 MG tablet Take 1 tablet (20 mg total) by mouth every 6 (six) hours. (Patient taking differently: Take 20 mg by mouth every 6 (six) hours as needed (stomach pain).) 60 tablet 0   docusate sodium (COLACE) 100 MG capsule Take 100 mg by mouth at bedtime.     ferrous sulfate 325 (65 FE) MG tablet Take 325 mg by mouth at bedtime.     omeprazole (PRILOSEC) 40 MG capsule Take 1 capsule (40 mg total) by mouth daily. 90 capsule 4   tirzepatide (MOUNJARO) 2.5 MG/0.5ML Pen Inject 2.5 mg into the skin once a week. 2 mL 5   Topiramate ER (TROKENDI XR) 50 MG CP24 Take 1 capsule by mouth daily. 30 capsule 1   venlafaxine XR (EFFEXOR-XR) 150 MG 24 hr capsule Take 150 mg by mouth daily.     vitamin C (ASCORBIC ACID) 500 MG tablet Take 500 mg by mouth at bedtime.     VOLNEA 0.15-0.02/0.01 MG (21/5) tablet TAKE 1 TABLET BY MOUTH EVERY DAY. TAKE ACTIVE TABLETS ONLY, SKIP PLACEBO TABLETS. 140 tablet 3   No  current facility-administered medications for this visit.    REVIEW OF SYSTEMS:   Constitutional: ( - ) fevers, ( - )  chills , ( - ) night sweats Eyes: ( - ) blurriness of vision, ( - ) double vision, ( - ) watery eyes Ears, nose, mouth, throat, and face: ( - ) mucositis, ( - ) sore throat Respiratory: ( - ) cough, ( - ) dyspnea, ( - ) wheezes Cardiovascular: ( - ) palpitation, ( - ) chest discomfort, ( - ) lower extremity swelling Gastrointestinal:  ( - ) nausea, ( - ) heartburn, ( - ) change in bowel habits Skin: ( - ) abnormal skin rashes Lymphatics: ( - ) new lymphadenopathy, ( - ) easy bruising Neurological: ( - ) numbness, ( - ) tingling, ( - ) new weaknesses Behavioral/Psych: ( - ) mood change, ( - ) new changes  All other systems were reviewed with the patient and are negative.  PHYSICAL EXAMINATION: Not performed due to virtual visit   LABORATORY DATA:  I have reviewed the data as listed    Latest Ref Rng & Units 07/15/2022    2:05 PM 05/28/2022    9:06 AM 04/10/2022    5:16 PM  CBC  WBC 4.0 - 10.5 K/uL 7.7  8.3  7.7   Hemoglobin 12.0 - 15.0 g/dL 24.4  01.0  27.2   Hematocrit 36.0 - 46.0 % 42.3  40.5  42.9   Platelets 150 - 400 K/uL 321  296.0  348        Latest Ref Rng & Units 05/28/2022    9:06 AM 04/10/2022    5:16 PM 03/05/2022   10:54 PM  CMP  Glucose 70 - 99 mg/dL 94  81  81  BUN 6 - 23 mg/dL 15  15  7    Creatinine 0.40 - 1.20 mg/dL 4.09  8.11  9.14   Sodium 135 - 145 mEq/L 139  139  135   Potassium 3.5 - 5.1 mEq/L 3.9  3.9  3.4   Chloride 96 - 112 mEq/L 105  104  105   CO2 19 - 32 mEq/L 24  22  18    Calcium 8.4 - 10.5 mg/dL 9.4  78.2  9.4   Total Protein 6.0 - 8.3 g/dL 6.5  7.9  7.3   Total Bilirubin 0.2 - 1.2 mg/dL 0.2  0.4  0.5   Alkaline Phos 39 - 117 U/L 81  102  91   AST 0 - 37 U/L 12  19  24    ALT 0 - 35 U/L 12  20  35      ASSESSMENT & PLAN Winefred Hillesheim is a 22 y.o. female who presents to the clinic for iron deficiency without  anemia.  #Iron deficiency without anemia: --Received IV feraheme periodically, most recent infusion given on 12/08/21. --Labs from 07/15/2022 show no evidence of iron deficiency.  Iron saturation 15%, total iron-binding capacity 361, ferritin 350. CBC showed no cytopenias.  --Currently on ferrous sulfate 325 mg once every other day. Recommend to continue but okay to decrease dose to 28 mg of elemental iron.  --No need for IV iron at this time.   #Abdominal pain: --Pain has resolved after undergoing lap cholecystectomy in March 2024.   Follow up: --RTC in 6 months with labs  No orders of the defined types were placed in this encounter.    All questions were answered. The patient knows to call the clinic with any problems, questions or concerns.  I have spent a total of 25 minutes minutes of non-face-to-face time, preparing to see the patient, performing a medically appropriate examination, counseling and educating the patient,  documenting clinical information in the electronic health record, and care coordination.   Georga Kaufmann PA-C Dept of Hematology and Oncology [facility] at [facility] Phone: [phone]

## 2022-07-24 ENCOUNTER — Telehealth: Payer: Self-pay | Admitting: Physician Assistant

## 2022-07-24 NOTE — Telephone Encounter (Signed)
Left patient message regarding next upcoming appointment times/dates

## 2022-07-27 ENCOUNTER — Ambulatory Visit: Admitting: Clinical

## 2022-07-28 ENCOUNTER — Telehealth (INDEPENDENT_AMBULATORY_CARE_PROVIDER_SITE_OTHER): Admitting: Pediatric Endocrinology

## 2022-08-05 ENCOUNTER — Encounter: Payer: Self-pay | Admitting: Registered"

## 2022-08-05 ENCOUNTER — Encounter: Attending: Pediatric Endocrinology | Admitting: Registered"

## 2022-08-05 DIAGNOSIS — Z713 Dietary counseling and surveillance: Secondary | ICD-10-CM | POA: Diagnosis present

## 2022-08-05 NOTE — Patient Instructions (Signed)
-   Aim to have 3 meals a day to include at least 3 food groups, such as: Breakfast: greek yogurt + toast + fruit or cereal + milk + fruit  Lunch: ham and cheese sandwich + raw broccoli as side item Dinner: burger with lettuce, tomatoes, and cheese + salad with ranch dressing   - Snacks can be 2 food groups such as: Protein bars Apple + peanut butter  Trail mix Cheez-its + cucumbers

## 2022-08-05 NOTE — Progress Notes (Signed)
Appointment start time: 8:10  Appointment end time: 8:50  Patient was seen on 08/05/2022 for nutrition counseling pertaining to disordered eating  Primary care provider: Mort Sawyers, FNP Therapist: Lawson Fiscal  ROI: will complete at next visit Any other medical team members: medication management Parents: mom Eunice Blase)   Assessment  Pt arrives with mom. Pt states she was triggered during her recent trip to [place] because she wasn't able to eat what everyone was eating and felt bad because she thought people would be mad at her for not eating what they were eating. States she had shrimp alfredo pasta for the first time and enjoyed it. States she has been eating veggie burgers. States she bought cheese and meat trays as snack options. States she has not done a great job with nutrition goals from previous visit.   States she wants to get up and go to gym daily or swimming or punching a punching bag. States she hasn't felt like getting out of bed lately; meets with therapist in a few days and will discuss there.    Previous appt: Mom reports patient has struggled with PCOS and effects of it over the years.    Eating history: Length of time:  Previous treatments: has seen RD before Goals for RD meetings:   Weight history:  Highest weight:    Lowest weight:  Most consistent weight:   What would you like to weigh: How has weight changed in the past year:   Medical Information:  Changes in hair, skin, nails since ED started:  Chewing/swallowing difficulties:  Reflux or heartburn:  Trouble with teeth:  LMP without the use of hormones:   Weight at that point:  Effect of exercise on menses:    Effect of hormones on menses:  Constipation, diarrhea:  Dizziness/lightheadedness:  Headaches/body aches:  Heart racing/chest pain:  Mood:  Sleep:  Focus/concentration:  Cold intolerance:  Vision changes:   Mental health diagnosis: BED   Dietary assessment: A typical day consists of  2 meals and 3 snacks  Safe foods include: Ramen noodles, cereal, FairLife chocolate milk with special K, soft serve ice cream from [employer], sandwich, water, bell peppers, raw broccoli, chocolate, cookies, candy, chips, bananas, apples, strawberries, kiwi, pineapple, cantaloupe, asparagus, green beans, lettuce, salads (sometimes), tomatoes, cucumbers, chicken, steak, bologna, [place], beef, bread, toast, granola, muffins, waffles/pancakes, potatoes, french fries, corn, baked beans, black beans, pinto beans, pasta, rice, mac and cheese, flip greek yogurt, cheese, Boba tea, shrimp  Avoided foods include: zucchini, onions, carrots, fish, seafood, red sauce, garlic, lemons, orange juice  24 hour recall:  B (7-8 am): FairLife chocolate milk + Special K cereal  S (4 pm): 2 Eggo protein waffles + PB L  S (5-6 pm): white chocolate bar + 2 packs of 100 calorie cookies D (7-8 pm): [employer]-veggie noodles + white rice + vegetables S:   Beverages: water   What Methods Do You Use To Control Your Weight (Compensatory behaviors)?           Restricting (calories, fat, carbs)  SIV  Diet pills  Laxatives  Diuretics  Alcohol or drugs  Exercise (what type)  Food rules or rituals (explain)  Binge  Estimated energy intake: 1600-1700 kcal  Estimated energy needs: 2000-2200 kcal 225-248 g CHO 150-165 g pro 56-61 g fat  Nutrition Diagnosis: NB-1.5 Disordered eating pattern As related to binge eating disorder.  As evidenced by intake reflecting an imbalance of food groups.  Intervention/Goals: Pt was educated and counseled on  the benefits of eating throughout the day, eating to fuel the body, and meal/snack ideas. Provided virtual eating disorder support groups resources. Pt agreed with goals listed. Goals: - Aim to have 3 meals a day to include at least 3 food groups, such as: Breakfast: greek yogurt + toast + fruit or cereal + milk + fruit  Lunch: ham and cheese sandwich + raw broccoli as  side item Dinner: burger with lettuce, tomatoes, and cheese + salad with ranch dressing - Snacks can be 2 food groups such as: Protein bars Apple + peanut butter  Trail mix Cheez-its + cucumbers  Meal plan:    3 meals    1-3 snacks  Monitoring and Evaluation: Patient will follow up in 3 weeks.

## 2022-08-11 ENCOUNTER — Ambulatory Visit (INDEPENDENT_AMBULATORY_CARE_PROVIDER_SITE_OTHER): Admitting: Clinical

## 2022-08-11 DIAGNOSIS — F840 Autistic disorder: Secondary | ICD-10-CM

## 2022-08-11 DIAGNOSIS — F419 Anxiety disorder, unspecified: Secondary | ICD-10-CM | POA: Diagnosis not present

## 2022-08-11 DIAGNOSIS — F32A Depression, unspecified: Secondary | ICD-10-CM

## 2022-08-11 NOTE — Progress Notes (Signed)
[facility] Counselor/Therapist Progress Note  Patient ID: Sarah Shah, MRN: [id number]    Date: 08/11/22  Time Spent: 10:08 am - 10:55 am: 47 Minutes  Type of Service Provided Individual Therapy  Type of Contact in-person Location: office  Mental Status Exam: Appearance:  Casual, a bit disheveled   Behavior: Appropriate  Motor: Normal  Speech/Language:  Clear and Coherent and Normal Rate  Affect: Appropriate  Mood: normal  Thought process: normal  Thought content:   WNL  Sensory/Perceptual disturbances:   WNL  Orientation: oriented to person, place, time/date, and situation  Attention: Fair  Concentration: Fair  Memory: WNL  Fund of knowledge:  Fair  Insight:   Fair  Judgment:  Fair  Impulse Control: Fair   Risk Assessment:  Danger to Self:  No - denied SI Self-injurious Behavior: No - denied SIB Danger to Others: No Duty to Warn:no  - Client was informed of clinician's upcoming vacation and emergency procedures were reviewed.      Presenting Problems, Reported Symptoms, and /or Interim History: Sarah Shah presented for a session to address life stress and anxiety.   Subjective: Sarah Shah presented for an individual outpatient therapy session. The following was addressed during sessions.   Sarah Shah rated her mood as a 5 out of 10 (with 1 being sad, 5 being neutral, and 10 being happy).  Sarah Shah rated her anxiety as a 5 out of 10 and irritation/frustration as a 5 out of 10 (with 10 being high). Her trip was good. There was a minor challenge at the airport but she navigated it well. Some challenges and stressors about the upcoming school semester were discussed and problem solved. Sarah Shah expressed significant anxiety about managing 3 classes, and reported that she was a 10 out of 10 (with 10 being 100% sure) that she should drop a class. A plan for next steps for this was discussed. Money and challenges with spending were discussed as well. A possible plan was discussed,  and Sarah Shah will discuss this further with her mother.   Interventions/Psychotherapy Techniques Used During Session: Cognitive Behavioral Therapy, executive functioning skills  Diagnosis: Autism spectrum disorder  Anxiety  Depression, unspecified depression type  MENTAL HEALTH INTERVENTIONS USED DURING TREATMENT & PATIENT'S RESPONSE TO INTERVENTIONS:  Short-term Objective addressed today:Sarah Shah will be able to use appropriate coping strategies including helpful thoughts to maintain a low level of anxiety and depressive symptoms. Mental health techniques used: Objective was addressed in session through the use of Cognitive Behavioral Therapy and discussion. Sarah Shah's response was positive.  Progress Toward Goal: progressing  Short-term Objective addressed today:Sarah Shah will be able to create a plan to address weaknesses described in objective 3A.  (In this case money management). Mental health techniques used: Objective was addressed in session through the use of  Cognitive Behavioral Therapy, executive functioning strategies, and discussion. Sarah Shah's response was positive.  Progress Toward Goal: progressing    PLAN  1. Sarah Shah will return for a therapy session.   2. Homework Given:  talk to her parent and email her advisor to find out about dropping a class, look up her schedule, talk with mother about money management and consider allowing her mother to continue to hold her credit cards. This homework will be reviewed with Sarah Shah at the next visit.  3. During the next session check in on school, mood, anxiety.     Sarah Derby, [name]  Individual Treatment Plan  - please see the notes from 04/17/2021 & 05/01/2021 for the initial treatment plan, the  updates in Winter 2024, and the goal review from 06/02/2022 for complete treatment plan information.    Problem/Need: Sarah Shah has a history of anxiety and mood concerns - with some ongoing challenges in these areas  Long-Term Goal #1: Sarah Shah will be able to  identify and address any mood or anxiety challenges and will maintain a low level of anxiety and depression symptoms as evidenced by self-report. Short-Term Objectives: Objective 1A: Sarah Shah will be able to rate her mood and anxiety - GOAL MET  Objective 1B: Sarah Shah will be able to identify any negative thinking patterns that are perpetuating anxiety or depressive symptoms and replace these with more adaptive thoughts. - GOAL Partially met  Objective 1C: Sarah Shah will be able to use appropriate coping strategies including helpful thoughts to maintain a low level of anxiety and depressive symptoms. Interventions: Cognitive Behavioral Therapy, Psychologist, occupational, Motivational Interviewing, and Psycho-education/Bibliotherapy  coping skills and other evidenced-based practices will be used to promote progress towards healthy functioning and to help manage decrease symptoms associated with their diagnosis.  Treatment Regimen: Individual skill building sessions every 2-4 weeks to address treatment goal/objective Target Date: 05/2023 Responsible Party: therapist and patient Person delivering treatment: Licensed Psychologist Sarah Derby, [name] will support the patient's ability to achieve the goals identified. Resolved:  No - Sarah Shah has made some progress, but mood and anxiety continue to be variable - goal continued   Problem/Need: Life transition - Sarah Shah is in the process of completing a major life transition including graduating from high school, starting college, and potentially working. - As of 06/02/2022 Sarah Shah has graduated from high school and successfully completed 2 semesters of college. However she continues to have life transitions, so the problem/need section has been updated  Updated Problem/Need: Life transition - Sarah Shah has continuing life transitions including working towards completing her college program and getting a job.  Long-Term Goal #2: Sarah Shah will successfully navigate these life transitions  including being able to manage her stress level. Short-Term Objectives: Objective 2A: Sarah Shah will be able to identify stressors associated with the life transitions - GOAL MET  - stressors include time management, test taking, and expanding her social circle  Objective 2B: [place] will be able to identify strategies to help navigate stressors associated with the life transitions and implement them  Interventions: Assertiveness/Communication, Psychologist, occupational, and Psycho-education/Bibliotherapy , and other evidenced-based practices will be used to promote progress towards healthy functioning and to help manage decrease symptoms associated with their diagnosis.  Treatment Regimen: Individual skill building sessions every 2-4 weeks to address treatment goal/objective Target Date: 05/2023 Responsible Party: therapist and patient Person delivering treatment: Licensed Psychologist Sarah Derby, [name] will support the patient's ability to achieve the goals identified. Resolved:  No  Problem/Need: Life skills - Jodey needs to develop some additional skills to facilitate independent living. Long-Term Goal #3: [place] will identify the additional life skills necessary to facilitate independent living. Short-Term Objectives: Objective 3A: Minnah will be able to identify areas that she needs to address to increase the likelihood of her being able to successfully live independently.  - GOAL MET  - areas identified so far (as of 06/02/2022) include money management, cooking, and transportation   Objective 3B: K[place]y will be able to create a plan to address weaknesses described in objective 3A.   Objective 3C: Asyiah will be able to implement steps on the plan developed in objective 3B. Interventions: Motivational Interviewing, Solution-Oriented/Positive Psychology, and Psycho-education/Bibliotherapy  daily life skills, and other evidenced-based practices will be  used to promote progress towards healthy functioning  and to help manage decrease symptoms associated with their diagnosis.  Treatment Regimen: Individual skill building sessions every 2-4 weeks to address treatment goal/objective Target Date: 05/2023 Responsible Party: therapist and patient Person delivering treatment: Licensed Psychologist Sarah Derby, [name] will support the patient's ability to achieve the goals identified. Resolved:  No - Khelsey has made some strides towards identifying areas that need to be addressed to facilitate independent living and made some progress on developing those skills, but process continues   Sarah Derby, [name]

## 2022-08-26 ENCOUNTER — Encounter: Payer: Self-pay | Admitting: Registered"

## 2022-08-26 ENCOUNTER — Encounter: Attending: Pediatric Endocrinology | Admitting: Registered"

## 2022-08-26 DIAGNOSIS — Z713 Dietary counseling and surveillance: Secondary | ICD-10-CM | POA: Insufficient documentation

## 2022-08-26 NOTE — Progress Notes (Signed)
Appointment start time: 8:04 Appointment end time: 8:58  Patient was seen on 08/26/2022 for nutrition counseling pertaining to disordered eating  Primary care provider: Mort Sawyers, FNP Therapist: Darlis Loan (sees once/month, in-person)  ROI: 08/26/2022 Any other medical team members: medication management Parents: mom Eunice Blase)   Assessment  Pt arrives with mom. Pt states she has been super depressed lately and started taking a new medication, effexor, to help with depression. States today is the first day she has noticed an improvement. States she has been working on having 3 meals a day. Reports for breakfast she has been having cereal with strawberries. States she will sometimes have rice in a bag or Ramen noodles + chicken nuggets or sandwiches ([place], cheese) + vegetables.   States she started walking with her sister; did it once since previous visit. States sister walks fast and also walks daily. States she would prefer to start walking in the pool at home.   Mom states she want pt to have clear and simple goals to work on related to the plate method or tracking. States she wants to support pt as much as possible at home; wants family including pt's sister to all work together for better relationship with food.   Previous appt: Mom reports patient has struggled with PCOS and effects of it over the years.    Eating history: Length of time:  Previous treatments: has seen RD before Goals for RD meetings:   Weight history:  Highest weight:    Lowest weight:  Most consistent weight:   What would you like to weigh: How has weight changed in the past year:   Medical Information:  Changes in hair, skin, nails since ED started:  Chewing/swallowing difficulties:  Reflux or heartburn:  Trouble with teeth:  LMP without the use of hormones:   Weight at that point:  Effect of exercise on menses:    Effect of hormones on menses:  Constipation, diarrhea:  Dizziness/lightheadedness:   Headaches/body aches:  Heart racing/chest pain:  Mood:  Sleep:  Focus/concentration:  Cold intolerance:  Vision changes:   Mental health diagnosis: BED   Dietary assessment: A typical day consists of 2 meals and 3 snacks  Safe foods include: Ramen noodles, cereal, [employer]Life chocolate milk with special K, soft serve ice cream from [employer], sandwich, water, bell peppers, raw broccoli, chocolate, cookies, candy, chips, bananas, apples, strawberries, kiwi, pineapple, cantaloupe, asparagus, green beans, lettuce, salads (sometimes), tomatoes, cucumbers, chicken, steak, bologna, [place], beef, bread, toast, granola, muffins, waffles/pancakes, potatoes, french fries, corn, baked beans, black beans, pinto beans, pasta, rice, mac and cheese, flip greek yogurt, cheese, Boba tea, shrimp  Avoided foods include: zucchini, onions, carrots, fish, seafood, red sauce, garlic, lemons, orange juice  24 hour recall:  B (7-8 am): cereal + strawberries + [employer] chocolate milk or grits   S (4 pm):  L: Boba tea S (5-6 pm): goldfish + skinny pop popcorn + cheez its + chocolate chips  D (7-8 pm): 1 slice of homemade pizza - ranch, cheese, chicken + water  S:   Beverages: water (5*40 oz; 200 oz), Boba tea, chocolate milk   What Methods Do You Use To Control Your Weight (Compensatory behaviors)?           Restricting (calories, fat, carbs)  SIV  Diet pills  Laxatives  Diuretics  Alcohol or drugs  Exercise (what type)  Food rules or rituals (explain)  Binge  Estimated energy intake: 1200-1300 kcal  Estimated energy needs: 2000-2200 kcal  225-248 g CHO 150-165 g pro 56-61 g fat  Nutrition Diagnosis: NB-1.5 Disordered eating pattern As related to binge eating disorder.  As evidenced by intake reflecting an imbalance of food groups.  Intervention/Goals: Pt was educated and counseled on the benefits of eating throughout the day, eating to fuel the body, and meal/snack ideas. Provided virtual  eating disorder support groups resources. Pt agreed with goals listed. Goals: - Great job having multiple meals a day, most including at least 3 food groups! - Include protein with snacks.  - Aim to walk in the pool once a week for at least 30 min.   Meal plan:    3 meals    1-3 snacks  Monitoring and Evaluation: Patient will follow up in 5 weeks.

## 2022-08-26 NOTE — Patient Instructions (Addendum)
-   Great job having multiple meals a day, most including at least 3 food groups!  - Include protein with snacks.   - Aim to walk in the pool once a week for at least 30 min.

## 2022-09-08 ENCOUNTER — Ambulatory Visit (INDEPENDENT_AMBULATORY_CARE_PROVIDER_SITE_OTHER): Admitting: Clinical

## 2022-09-08 DIAGNOSIS — F840 Autistic disorder: Secondary | ICD-10-CM

## 2022-09-08 DIAGNOSIS — F419 Anxiety disorder, unspecified: Secondary | ICD-10-CM

## 2022-09-08 DIAGNOSIS — F32A Depression, unspecified: Secondary | ICD-10-CM

## 2022-09-08 NOTE — Progress Notes (Signed)
[facility] Counselor/Therapist Progress Note  Patient ID: Denora Ingham, MRN: [id number]    Date: 09/08/22  Time Spent: 9:01 am - 9:48 am: 47 Minutes  Type of Service Provided Individual Therapy  Type of Contact virtual (via [employer] with real time audio and visual interaction)  Patient Location: home       Provider Location: office  [name] participated from home, via video, and consented to treatment. Therapist participated from office.  Patient consented to telehealth therapy and is aware and consented to the limitations of telehealth.    Mental Status Exam: Appearance:  Casual and Fairly Groomed     Behavior: Appropriate and Sharing  Motor: Normal  Speech/Language:  Clear and Coherent and Normal Rate  Affect: Appropriate  Mood: normal  Thought process: normal  Thought content:   WNL  Sensory/Perceptual disturbances:   WNL  Orientation: oriented to person, place, time/date, and situation  Attention: Good  Concentration: Good  Memory: WNL  Fund of knowledge:  Fair  Insight:   Fair  Judgment:  Fair  Impulse Control: Fair   Risk Assessment:  Danger to Self:  No - SI denied Self-injurious Behavior: No - SIB denied Danger to Others: No Duty to Warn:no  Presenting Problems, Reported Symptoms, and /or Interim History: Dolores presented for a session to address stress, variable mood, and anxiety.   Subjective: Jennet presented for an individual outpatient therapy session. The following was addressed during sessions.   Avonne rated her mood as a 5 out of 10 (with 1 being sad, 5 being neutral, and 10 being happy). Her mood has varied from good to somewhat down since the last visit. Sharene rated her anxiety as a 2-3 out of 10 and irritation/frustration as a 4 out of 10 (with 10 being high). Porsha reported that school has started and is going well, as she likes both of her classes. She identified a potential challenge as having to take notes but had  already come up with a solution for how to address this challenge. Continuing to stay on top of her schoolwork was discussed. Hagen reported that she has been feeling somewhat down at times and stressors associated with both school and home situations were discussed. Strategies that could be used to help address some of these depressive symptoms were discussed including physical exercise and going outside, and expanding her social circle. Some barriers that are associated with some of these strategies were discussed. Catalena reported that she has been spending slightly less money and will continue to save for a trip she wants to take.    Interventions/Psychotherapy Techniques Used During Session: Cognitive Behavioral Therapy, Assertiveness/Communication, and Psychologist, occupational, executive functioning skills supports  Diagnosis: Anxiety  Autism spectrum disorder  Depression, unspecified depression type  MENTAL HEALTH INTERVENTIONS USED DURING TREATMENT & PATIENT'S RESPONSE TO INTERVENTIONS:  Short-term Objective addressed today:Tesla will be able to use appropriate coping strategies including helpful thoughts to maintain a low level of anxiety and depressive symptoms. Mental health techniques used: Objective was addressed in session through the use of  Cognitive Behavioral Therapy and discussion. Kristinia's response was positive. Progress Toward Goal: Mixed -although Purity reported that there were a few instances when her mood was good and she has been using some strategies to help manage her mood, she also has continued to experience some down moods.  Short-term Objective addressed today: Shaquella will be able to identify strategies to help navigate stressors associated with the life transitions and implement them Mental  health techniques used: Objective was addressed in session through the use of Assertiveness/Communication and Psychologist, occupational, executive functioning skills supports and discussion.  Kendel's response was positive. Progress Toward Goal: Some progress    PLAN  1. Yaritza will return for a therapy session.   2. Homework Given: Try on her swim In her bedroom to see if this is tolerable to go swimming, try to go to the gym once with her sister, continue to work to stay on top of her schoolwork. This homework will be reviewed with [name] at the next visit.  3. During the next session check in on mood and anxiety as well as school related stress.     [name], [name]  Individual Treatment Plan  - please see the notes from 04/17/2021 & 05/01/2021 for the initial treatment plan, the updates in Winter 2024, and the goal review from 06/02/2022 for complete treatment plan information.    Problem/Need: Avenley has a history of anxiety and mood concerns - with some ongoing challenges in these areas  Long-Term Goal #1: Mette will be able to identify and address any mood or anxiety challenges and will maintain a low level of anxiety and depression symptoms as evidenced by self-report. Short-Term Objectives: Objective 1A: Amandalee will be able to rate her mood and anxiety - GOAL MET  Objective 1B: Ziaire will be able to identify any negative thinking patterns that are perpetuating anxiety or depressive symptoms and replace these with more adaptive thoughts. - GOAL Partially met  Objective 1C: Sreenika will be able to use appropriate coping strategies including helpful thoughts to maintain a low level of anxiety and depressive symptoms. Interventions: Cognitive Behavioral Therapy, Psychologist, occupational, Motivational Interviewing, and Psycho-education/Bibliotherapy  coping skills and other evidenced-based practices will be used to promote progress towards healthy functioning and to help manage decrease symptoms associated with their diagnosis.  Treatment Regimen: Individual skill building sessions every 2-4 weeks to address treatment goal/objective Target Date: 05/2023 Responsible Party: therapist and  patient Person delivering treatment: Licensed Psychologist [name], [name] will support the patient's ability to achieve the goals identified. Resolved:  No - Aquita has made some progress, but mood and anxiety continue to be variable - goal continued   Problem/Need: Life transition - Sokhna is in the process of completing a major life transition including graduating from high school, starting college, and potentially working. - As of 06/02/2022 Lakeira has graduated from high school and successfully completed 2 semesters of college. However she continues to have life transitions, so the problem/need section has been updated  Updated Problem/Need: Life transition - Shantey has continuing life transitions including working towards completing her college program and getting a job.  Long-Term Goal #2: Tamaira will successfully navigate these life transitions including being able to manage her stress level. Short-Term Objectives: Objective 2A: Alayssa will be able to identify stressors associated with the life transitions - GOAL MET  - stressors include time management, test taking, and expanding her social circle  Objective 2B: Hollyann will be able to identify strategies to help navigate stressors associated with the life transitions and implement them  Interventions: Assertiveness/Communication, Psychologist, occupational, and Psycho-education/Bibliotherapy , and other evidenced-based practices will be used to promote progress towards healthy functioning and to help manage decrease symptoms associated with their diagnosis.  Treatment Regimen: Individual skill building sessions every 2-4 weeks to address treatment goal/objective Target Date: 05/2023 Responsible Party: therapist and patient Person delivering treatment: Licensed Psychologist [name], [name] will support the  patient's ability to achieve the goals identified. Resolved:  No  Problem/Need: Life skills - Brendalee needs to develop some additional skills  to facilitate independent living. Long-Term Goal #3: Stellina will identify the additional life skills necessary to facilitate independent living. Short-Term Objectives: Objective 3A: Naja will be able to identify areas that she needs to address to increase the likelihood of her being able to successfully live independently.  - GOAL MET  - areas identified so far (as of 06/02/2022) include money management, cooking, and transportation   Objective 3B: Bobbiejo will be able to create a plan to address weaknesses described in objective 3A.   Objective 3C: Loxie will be able to implement steps on the plan developed in objective 3B. Interventions: Motivational Interviewing, Solution-Oriented/Positive Psychology, and Psycho-education/Bibliotherapy  daily life skills, and other evidenced-based practices will be used to promote progress towards healthy functioning and to help manage decrease symptoms associated with their diagnosis.  Treatment Regimen: Individual skill building sessions every 2-4 weeks to address treatment goal/objective Target Date: 05/2023 Responsible Party: therapist and patient Person delivering treatment: Licensed Psychologist [name], [name] will support the patient's ability to achieve the goals identified. Resolved:  No - Rozalie has made some strides towards identifying areas that need to be addressed to facilitate independent living and made some progress on developing those skills, but process continues   [name], [name]

## 2022-09-22 ENCOUNTER — Ambulatory Visit (INDEPENDENT_AMBULATORY_CARE_PROVIDER_SITE_OTHER): Admitting: Clinical

## 2022-09-22 DIAGNOSIS — F32A Depression, unspecified: Secondary | ICD-10-CM

## 2022-09-22 DIAGNOSIS — F419 Anxiety disorder, unspecified: Secondary | ICD-10-CM | POA: Diagnosis not present

## 2022-09-22 DIAGNOSIS — F840 Autistic disorder: Secondary | ICD-10-CM

## 2022-09-22 NOTE — Progress Notes (Signed)
[facility] Counselor/Therapist Progress Note  Patient ID: Sarah Shah, MRN: [id number]    Date: 09/22/22  Time Spent: 10:36 pm - 11:34 pm: 58 Minutes  Type of Service Provided Individual Therapy  Type of Contact in-person Location: office   Mental Status Exam: Appearance:  Casual and Fairly Groomed     Behavior: Sharing  Motor: Normal, seemed a bit tired  Speech/Language:  Clear and Coherent and Normal Rate  Affect: Appropriate  Mood: normal, a bit down   Thought process: normal  Thought content:   WNL  Sensory/Perceptual disturbances:   WNL  Orientation: oriented to person, place, time/date, and situation  Attention: Fair to good  Concentration: Fair to good  Memory: WNL  Fund of knowledge:  Fair  Insight:   Fair  Judgment:  Poor in relation to money management, fair in other areas   Impulse Control: Poor in relation to money management, fair in other areas    Risk Assessment: No apparent indicators of SI or HI during the visit  Presenting Problems, Reported Symptoms, and /or Interim History: Sarah Shah presented for a session to address anxiety and life stress.   Subjective: Sarah Shah presented for an individual outpatient therapy session. The following was addressed during sessions.   Sarah Shah rated her mood as a 5 out of 10 (with 1 being sad, 5 being neutral, and 10 being happy). Sarah Shah rated her anxiety as a 6 out of 10 and irritation/frustration as a 5-8 out of 10 (with 10 being high). Sarah Shah discussed several stressful interactions between her and her mother, as well as some disappointments. Therapist and Alaura worked to develop a more through understanding of what occurred and what Sarah Shah could do differently next time. Ways to spend time with her mother in non-stressful ways were also discussed. How Sarah Shah could express her feelings as well as being clear about what she wanted or needed at that time were discussed. Sarah Shah also indicated that she has been feeling down  about herself and her weight. She has made some positive changes for her health, and continuing to focus on lifestyle changes that could support her overall health, including her mental health, were discussed (e.g., physical activity, eating a well balanced diet). Anxiety associated with school and ways to manage that were discussed. Money management and Sarah Shah's ongoing struggles with impulsive purchases were discussed. Specifically, Sarah Shah talked about making a purchase that was expensive, and although fun to do, ended up not being worth it. Therapist and Sarah Shah discussed plans for addressing this concern.     Interventions/Psychotherapy Techniques Used During Session: Cognitive Behavioral Therapy and Solution-Oriented/Positive Psychology  Diagnosis: Anxiety  Autism spectrum disorder  Depression, unspecified depression type  MENTAL HEALTH INTERVENTIONS USED DURING TREATMENT & PATIENT'S RESPONSE TO INTERVENTIONS:  Short-term Objective addressed today:Sarah Shah will be able to use appropriate coping strategies including helpful thoughts to maintain a low level of anxiety and depressive symptoms. Mental health techniques used: Objective was addressed in session through the use of Cognitive Behavioral Therapy and discussion. Mittie's response was generally positive.  Progress Toward Goal: progressing  Short-term Objective addressed today:Sarah Shah will be able to create a plan to address weaknesses described in objective 3A (in today's visit money management) AND Sarah Shah will be able to implement steps on the plan developed in objective 3B. Mental health techniques used: Objective was addressed in session through the use of Solution-Oriented/Positive Psychology and discussion. Sarah Shah's response was positive. Progress Toward Goal: slight progress - although Sarah Shah had developed plans for managing money,  she seems to intermittently follow them and continues to engage in impulsive spending at times    PLAN  1. Sarah Shah  will return for a therapy session.   2. Homework Given:  try to remember to discuss purchases over $100 with a parent/family member before spending the money, continue to work on anxiety management strategies related to school, talk to her mother about what she needs or wants from her when she is expressing upset. This homework will be reviewed with Sarah Shah at the next visit.  3. During the next session check in on mood, anxiety, school, money management.     Sarah Derby, [name]  Individual Treatment Plan  - please see the notes from 04/17/2021 & 05/01/2021 for the initial treatment plan, the updates in Winter 2024, and the goal review from 06/02/2022 for complete treatment plan information.    Problem/Need: Adia has a history of anxiety and mood concerns - with some ongoing challenges in these areas  Long-Term Goal #1: Andee will be able to identify and address any mood or anxiety challenges and will maintain a low level of anxiety and depression symptoms as evidenced by self-report. Short-Term Objectives: Objective 1A: Tahirah will be able to rate her mood and anxiety - GOAL MET  Objective 1B: Jaquline will be able to identify any negative thinking patterns that are perpetuating anxiety or depressive symptoms and replace these with more adaptive thoughts. - GOAL Partially met  Objective 1C: Janissa will be able to use appropriate coping strategies including helpful thoughts to maintain a low level of anxiety and depressive symptoms. Interventions: Cognitive Behavioral Therapy, Psychologist, occupational, Motivational Interviewing, and Psycho-education/Bibliotherapy  coping skills and other evidenced-based practices will be used to promote progress towards healthy functioning and to help manage decrease symptoms associated with their diagnosis.  Treatment Regimen: Individual skill building sessions every 2-4 weeks to address treatment goal/objective Target Date: 05/2023 Responsible Party: therapist and  patient Person delivering treatment: Licensed Psychologist Sarah Derby, [name] will support the patient's ability to achieve the goals identified. Resolved:  No - Biak has made some progress, but mood and anxiety continue to be variable - goal continued   Problem/Need: Life transition - Keyonta is in the process of completing a major life transition including graduating from high school, starting college, and potentially working. - As of 06/02/2022 Wynema has graduated from high school and successfully completed 2 semesters of college. However she continues to have life transitions, so the problem/need section has been updated  Updated Problem/Need: Life transition - Kadian has continuing life transitions including working towards completing her college program and getting a job.  Long-Term Goal #2: Shemicka will successfully navigate these life transitions including being able to manage her stress level. Short-Term Objectives: Objective 2A: Bryseida will be able to identify stressors associated with the life transitions - GOAL MET  - stressors include time management, test taking, and expanding her social circle  Objective 2B: Adylene will be able to identify strategies to help navigate stressors associated with the life transitions and implement them  Interventions: Assertiveness/Communication, Psychologist, occupational, and Psycho-education/Bibliotherapy , and other evidenced-based practices will be used to promote progress towards healthy functioning and to help manage decrease symptoms associated with their diagnosis.  Treatment Regimen: Individual skill building sessions every 2-4 weeks to address treatment goal/objective Target Date: 05/2023 Responsible Party: therapist and patient Person delivering treatment: Licensed Psychologist Sarah Derby, [name] will support the patient's ability to achieve the goals identified. Resolved:  No  Problem/Need: Life  skills - Uilani needs to develop some additional skills  to facilitate independent living. Long-Term Goal #3: Kaiden will identify the additional life skills necessary to facilitate independent living. Short-Term Objectives: Objective 3A: Lorena will be able to identify areas that she needs to address to increase the likelihood of her being able to successfully live independently.  - GOAL MET  - areas identified so far (as of 06/02/2022) include money management, cooking, and transportation   Objective 3B: Navaeh will be able to create a plan to address weaknesses described in objective 3A.   Objective 3C: Tinaya will be able to implement steps on the plan developed in objective 3B. Interventions: Motivational Interviewing, Solution-Oriented/Positive Psychology, and Psycho-education/Bibliotherapy  daily life skills, and other evidenced-based practices will be used to promote progress towards healthy functioning and to help manage decrease symptoms associated with their diagnosis.  Treatment Regimen: Individual skill building sessions every 2-4 weeks to address treatment goal/objective Target Date: 05/2023 Responsible Party: therapist and patient Person delivering treatment: Licensed Psychologist Sarah Derby, [name] will support the patient's ability to achieve the goals identified. Resolved:  No - Rondalyn has made some strides towards identifying areas that need to be addressed to facilitate independent living and made some progress on developing those skills, but process continues   Sarah Derby, [name]

## 2022-09-30 ENCOUNTER — Encounter: Attending: Family | Admitting: Registered"

## 2022-09-30 ENCOUNTER — Encounter: Payer: Self-pay | Admitting: Registered"

## 2022-09-30 DIAGNOSIS — Z713 Dietary counseling and surveillance: Secondary | ICD-10-CM | POA: Insufficient documentation

## 2022-09-30 DIAGNOSIS — F5081 Binge eating disorder: Secondary | ICD-10-CM | POA: Diagnosis present

## 2022-09-30 DIAGNOSIS — E669 Obesity, unspecified: Secondary | ICD-10-CM | POA: Insufficient documentation

## 2022-09-30 NOTE — Progress Notes (Signed)
Appointment start time: 8:06 Appointment end time: 8:45  Patient was seen on 09/30/2022 for nutrition counseling pertaining to disordered eating  Primary care provider: Mort Sawyers, FNP Therapist: Darlis Loan (sees once/month, in-person)  ROI: 08/26/2022 Any other medical team members: medication management Parents: mom Eunice Blase), sister [name])   Assessment  Pt arrives with sister. Pt states she has decided to stop eating meat 2 weeks ago and having less stomach discomfort. Sister states pt has been doing good and trying to encourage her to eat protein. States she will make pt minestrone soup or bean salad today to help with protein intake.   Pt states she has had more energy since she stopped eating meat.   States she is still seeing therapist once/month. States she started school the second week of August. States she is taking 2 animal classes at [employer]. States she has class on Wednesdays during afternoon hours. States she has been taking her lunch to school lately.   Sister states pt has been meal prepping her vegetables and they are ready-to-eat in the fridge. Reports recent visit to local farmer's market. States she has been eating bell peppers, cucumbers, and carrots mainly. States she is willing to try hummus with vegetables as balanced snack option.   Pt states she is having 3 meals a day and having at least 3 food groups with each meal. States she is doing well with including protein options with snack options. States she has been walking in the pool at home when weather permits but weather is becoming cooler. Sister states they have a gym membership and they're able to walk in the pool there and plan to go.   Previous appt: Mom reports patient has struggled with PCOS and effects of it over the years.    Eating history: Length of time:  Previous treatments: has seen RD before Goals for RD meetings:   Weight history:  Highest weight:    Lowest weight:  Most  consistent weight:   What would you like to weigh: How has weight changed in the past year:   Medical Information:  Changes in hair, skin, nails since ED started:  Chewing/swallowing difficulties:  Reflux or heartburn:  Trouble with teeth:  LMP without the use of hormones:   Weight at that point:  Effect of exercise on menses:    Effect of hormones on menses:  Constipation, diarrhea:  Dizziness/lightheadedness:  Headaches/body aches:  Heart racing/chest pain:  Mood:  Sleep:  Focus/concentration:  Cold intolerance:  Vision changes:   Mental health diagnosis: BED   Dietary assessment: A typical day consists of 2 meals and 3 snacks  Safe foods include: Ramen noodles, cereal, [employer] chocolate milk with special K, soft serve ice cream from [employer], sandwich, water, bell peppers, raw broccoli, chocolate, cookies, candy, chips, bananas, apples, strawberries, kiwi, pineapple, cantaloupe, asparagus, green beans, lettuce, salads (sometimes), tomatoes, cucumbers, chicken, steak, bologna, [place], beef, bread, toast, granola, muffins, waffles/pancakes, potatoes, french fries, corn, baked beans, black beans, pinto beans, pasta, rice, mac and cheese, flip greek yogurt, cheese, Boba tea, shrimp  Avoided foods include: zucchini, onions, carrots, fish, seafood, red sauce, garlic, lemons, orange juice  24 hour recall:  B (7-8 am): cereal + strawberries (sometimes) + [employer] chocolate milk    S (1 pm): bell peppers L (5 pm): mashed potatoes + garbanzo beans + bell peppers  S:   D (7-8 pm): skipped   S:   Beverages: water (5*40 oz; 200 oz), [employer] chocolate milk  What Methods Do You Use To Control Your Weight (Compensatory behaviors)?           Restricting (calories, fat, carbs)  SIV  Diet pills  Laxatives  Diuretics  Alcohol or drugs  Exercise (what type)  Food rules or rituals (explain)  Binge  Estimated energy intake: 800-900 kcal  Estimated energy needs: 2000-2200  kcal 225-248 g CHO 150-165 g pro 56-61 g fat  Nutrition Diagnosis: NB-1.5 Disordered eating pattern As related to binge eating disorder.  As evidenced by intake reflecting an imbalance of food groups.  Intervention/Goals: Pt was encouraged with changes made from previous visit. Reminded and discussed the purpose of having adequate meals/snacks and how to implement this into her regimen. Provided resource for vegetarian protein options.  Pt agreed with goals listed. Goals: - Continue to aim for at least 3 food groups with each meal.  - Aim for the 3 meals a day.  - Meals should be no longer than 5 hours apart.  - Snacks should include at least 2 food groups such as, Bell peppers + deviled eggs Carrots + ranch/hummus [place] yogurt  Apples + peanut butter   Meal plan:    3 meals    1-3 snacks  Monitoring and Evaluation: Patient will follow up in 5 weeks.

## 2022-09-30 NOTE — Patient Instructions (Addendum)
-   Continue to aim for at least 3 food groups with each meal.   - Aim for the 3 meals a day.   - Meals should be no longer than 5 hours apart.   - Snacks should include at least 2 food groups such as, Bell peppers + deviled eggs Carrots + ranch/hummus [place] yogurt  Apples + peanut butter

## 2022-10-13 ENCOUNTER — Encounter: Payer: Self-pay | Admitting: Family

## 2022-10-13 ENCOUNTER — Ambulatory Visit: Admitting: Clinical

## 2022-10-13 DIAGNOSIS — F32A Depression, unspecified: Secondary | ICD-10-CM | POA: Diagnosis not present

## 2022-10-13 DIAGNOSIS — F840 Autistic disorder: Secondary | ICD-10-CM

## 2022-10-13 DIAGNOSIS — F419 Anxiety disorder, unspecified: Secondary | ICD-10-CM | POA: Diagnosis not present

## 2022-10-13 NOTE — Telephone Encounter (Signed)
Can you print these out and put in inbox so I can try to read before appt?

## 2022-10-13 NOTE — Progress Notes (Unsigned)
[facility] Counselor/Therapist Progress Note  Patient ID: Sarah Shah, MRN: [id number]    Date: 10/13/22  Time Spent: 2:00 pm - 2:56 pm: 56 Minutes  Type of Service Provided Individual Therapy  Type of Contact in-person Location: office   Mental Status Exam: Appearance:  Casual     Behavior: Sharing  Motor: Normal  Speech/Language:  Clear and Coherent and Normal Rate  Affect: Appropriate  Mood: Upset  Thought process: normal  Thought content:   WNL  Sensory/Perceptual disturbances:   WNL  Orientation: oriented to person, place, time/date, and situation  Attention: Good  Concentration: Good  Memory: WNL  Fund of knowledge:  Fair  Insight:   Fair  Judgment:  Fair  Impulse Control: Fair   Risk Assessment:  Danger to Self:  No - denied SI Self-injurious Behavior: No Danger to Others: No - denied SIB Duty to Warn:no  Presenting Problems, Reported Symptoms, and /or Interim History: Sarah Shah presented for a session to address life stress and anxiety.   Subjective: Sarah Shah presented for an individual outpatient therapy session. The following was addressed during sessions.   Sarah Shah reported that her classes have been challenging and her grades have been docked for things that are not her fault. Sarah Shah rated her mood as a 5 out of 10 (with 1 being sad, 5 being neutral, and 10 being happy).  Sarah Shah rated her anxiety as a 3 out of 10 and irritation/frustration as a 4 out of 10 (with 10 being high). Ways to address her concerns and manage her feelings around this was discussed.  She discussed an upsetting incident that she witnessed and how to manage her stress about this was discussed, along with how Sarah Shah could make a report about what she witnessed. A plan moving forward was discussed as well.    Interventions/Psychotherapy Techniques Used During Session: Cognitive Behavioral Therapy  Diagnosis: Anxiety  Autism spectrum disorder  Depression, unspecified depression  type  MENTAL HEALTH INTERVENTIONS USED DURING TREATMENT & PATIENT'S RESPONSE TO INTERVENTIONS:  Short-term Objective addressed today:Sarah Shah will be able to use appropriate coping strategies including helpful thoughts to maintain a low level of anxiety and depressive symptoms. Mental health techniques used: Objective was addressed in session through the use of Cognitive Behavioral Therapy and discussion. Sarah Shah's response was mixed. Progress Toward Goal: Some progress      PLAN  1. Sarah Shah will return for a therapy session.   2. Homework Given:  monitor mood and anxiety, talk to her professor. This homework will be reviewed with Sarah Shah at the next visit.  3. During the next session check in on mood, stress level, and anxiety.     Sarah Derby, Sarah Shah  Individual Treatment Plan  - please see the notes from 04/17/2021 & 05/01/2021 for the initial treatment plan, the updates in Winter 2024, and the goal review from 06/02/2022 for complete treatment plan information.    Problem/Need: Sarah Shah has a history of anxiety and mood concerns - with some ongoing challenges in these areas  Long-Term Goal #1: Sarah Shah will be able to identify and address any mood or anxiety challenges and will maintain a low level of anxiety and depression symptoms as evidenced by self-report. Short-Term Objectives: Objective 1A: Sarah Shah will be able to rate her mood and anxiety - GOAL MET  Objective 1B: Sarah Shah will be able to identify any negative thinking patterns that are perpetuating anxiety or depressive symptoms and replace these with more adaptive thoughts. - GOAL Partially met  Objective 1C: Sarah Shah  will be able to use appropriate coping strategies including helpful thoughts to maintain a low level of anxiety and depressive symptoms. Interventions: Cognitive Behavioral Therapy, Psychologist, occupational, Motivational Interviewing, and Psycho-education/Bibliotherapy  coping skills and other evidenced-based practices will be used to promote  progress towards healthy functioning and to help manage decrease symptoms associated with their diagnosis.  Treatment Regimen: Individual skill building sessions every 2-4 weeks to address treatment goal/objective Target Date: 05/2023 Responsible Party: therapist and patient Person delivering treatment: Licensed Psychologist Sarah Derby, Sarah Shah will support the patient's ability to achieve the goals identified. Resolved:  No - Sarah Shah has made some progress, but mood and anxiety continue to be variable - goal continued   Problem/Need: Life transition - Sarah Shah is in the process of completing a major life transition including graduating from high school, starting college, and potentially working. - As of 06/02/2022 Sarah Shah has graduated from high school and successfully completed 2 semesters of college. However she continues to have life transitions, so the problem/need section has been updated  Updated Problem/Need: Life transition - Sarah Shah has continuing life transitions including working towards completing her college program and getting a job.  Long-Term Goal #2: Sarah Shah will successfully navigate these life transitions including being able to manage her stress level. Short-Term Objectives: Objective 2A: Sarah Shah will be able to identify stressors associated with the life transitions - GOAL MET  - stressors include time management, test taking, and expanding her social circle  Objective 2B: Sarah Shah will be able to identify strategies to help navigate stressors associated with the life transitions and implement them  Interventions: Assertiveness/Communication, Psychologist, occupational, and Psycho-education/Bibliotherapy , and other evidenced-based practices will be used to promote progress towards healthy functioning and to help manage decrease symptoms associated with their diagnosis.  Treatment Regimen: Individual skill building sessions every 2-4 weeks to address treatment goal/objective Target Date:  05/2023 Responsible Party: therapist and patient Person delivering treatment: Licensed Psychologist Sarah Derby, Sarah Shah will support the patient's ability to achieve the goals identified. Resolved:  No  Problem/Need: Life skills - Sarah Shah needs to develop some additional skills to facilitate independent living. Long-Term Goal #3: Sarah Shah will identify the additional life skills necessary to facilitate independent living. Short-Term Objectives: Objective 3A: Sarah Shah will be able to identify areas that she needs to address to increase the likelihood of her being able to successfully live independently.  - GOAL MET  - areas identified so far (as of 06/02/2022) include money management, cooking, and transportation   Objective 3B: Sherida will be able to create a plan to address weaknesses described in objective 3A.   Objective 3C: Ben will be able to implement steps on the plan developed in objective 3B. Interventions: Motivational Interviewing, Solution-Oriented/Positive Psychology, and Psycho-education/Bibliotherapy  daily life skills, and other evidenced-based practices will be used to promote progress towards healthy functioning and to help manage decrease symptoms associated with their diagnosis.  Treatment Regimen: Individual skill building sessions every 2-4 weeks to address treatment goal/objective Target Date: 05/2023 Responsible Party: therapist and patient Person delivering treatment: Licensed Psychologist Sarah Derby, Sarah Shah will support the patient's ability to achieve the goals identified. Resolved:  No - Tashanna has made some strides towards identifying areas that need to be addressed to facilitate independent living and made some progress on developing those skills, but process continues   Sarah Derby, Sarah Shah

## 2022-10-14 NOTE — Telephone Encounter (Signed)
Documents have been printed and placed in Tabitha's inbox.

## 2022-10-15 ENCOUNTER — Encounter: Payer: Self-pay | Admitting: Family

## 2022-10-15 ENCOUNTER — Ambulatory Visit: Payer: MEDICAID | Admitting: Family

## 2022-10-15 VITALS — BP 92/64 | HR 115 | Temp 98.0°F | Ht 65.0 in | Wt 325.6 lb

## 2022-10-15 DIAGNOSIS — E88819 Insulin resistance, unspecified: Secondary | ICD-10-CM | POA: Diagnosis not present

## 2022-10-15 DIAGNOSIS — F840 Autistic disorder: Secondary | ICD-10-CM

## 2022-10-15 DIAGNOSIS — Z789 Other specified health status: Secondary | ICD-10-CM | POA: Insufficient documentation

## 2022-10-15 DIAGNOSIS — R635 Abnormal weight gain: Secondary | ICD-10-CM | POA: Diagnosis not present

## 2022-10-15 DIAGNOSIS — R5383 Other fatigue: Secondary | ICD-10-CM

## 2022-10-15 DIAGNOSIS — Z23 Encounter for immunization: Secondary | ICD-10-CM

## 2022-10-15 DIAGNOSIS — E282 Polycystic ovarian syndrome: Secondary | ICD-10-CM

## 2022-10-15 DIAGNOSIS — E8881 Metabolic syndrome: Secondary | ICD-10-CM | POA: Diagnosis not present

## 2022-10-15 DIAGNOSIS — E66813 Obesity, class 3: Secondary | ICD-10-CM

## 2022-10-15 DIAGNOSIS — Z6841 Body Mass Index (BMI) 40.0 and over, adult: Secondary | ICD-10-CM

## 2022-10-15 LAB — T4, FREE: Free T4: 0.63 ng/dL (ref 0.60–1.60)

## 2022-10-15 LAB — TSH: TSH: 2.48 u[IU]/mL (ref 0.35–5.50)

## 2022-10-15 LAB — VITAMIN B12: Vitamin B-12: 341 pg/mL (ref 211–911)

## 2022-10-15 MED ORDER — TIRZEPATIDE 7.5 MG/0.5ML ~~LOC~~ SOAJ
7.5000 mg | SUBCUTANEOUS | 1 refills | Status: DC
Start: 2022-10-15 — End: 2023-01-17

## 2022-10-15 NOTE — Progress Notes (Signed)
Established Patient Office Visit  Subjective:      CC:  Chief Complaint  Patient presents with   Medical Management of Chronic Issues    HPI: Sarah Shah is a 22 y.o. female presenting on 10/15/2022 for Medical Management of Chronic Issues . Here for medical sufficiency letter for tricare insurance, to establish her for dependency determination.   She has been diagnosed with ASD fall of 2018 as well as ADHD, ODD, developmental delay, dysgraphia and anxiety. 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 Recommendation per licensed psychologist was to look into CAP/DA in order to maintain active participation in the community and set up with vocational rehab to try to get job support, transportation serve and or job coaching.   Because of these limitations she lives at home with her parents, and requires guidance throughout the day to redirect social behaviors and keep her on task.   Mom states that pt dresses independently and can take her medication independently with help from her mom for setting up her medications.  Pt does not cook or drive for herself, without a drivers license.  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    Recently changed her diet to vegetarian and states bowels have improved as well as she 'feels more clear with her mind' as well.   She will be following with [place] rather than endo for PCOS as well as insulin resistance as her endo recently retired.    Social history:  Relevant past medical, surgical, family and social history reviewed and updated as indicated. Interim medical history since our last visit reviewed.  Allergies  and medications reviewed and updated.  DATA REVIEWED: CHART IN EPIC     ROS: Negative unless specifically indicated above in HPI.    Current Outpatient Medications:    amphetamine-dextroamphetamine (ADDERALL XR) 30 MG 24 hr capsule, Take 30 mg by mouth every morning., Disp: , Rfl:    busPIRone (BUSPAR) 30 MG tablet, Take 30 mg by mouth 2 (two) times daily., Disp: , Rfl:    cloNIDine (CATAPRES) 0.2 MG tablet, Take 0.2 mg by mouth 2 (two) times daily., Disp: , Rfl:    dicyclomine (BENTYL) 20 MG tablet, Take 20 mg by mouth every 6 (six) hours as needed for spasms., Disp: , Rfl:    docusate sodium (COLACE) 100 MG capsule, Take 100 mg by mouth at bedtime., Disp: , Rfl:    famotidine (PEPCID) 20 MG tablet, Take 20 mg by mouth daily., Disp: , Rfl:    ferrous sulfate 325 (65 FE) MG tablet, Take 325 mg by mouth at bedtime., Disp: , Rfl:    omeprazole (PRILOSEC) 40 MG capsule, Take 1 capsule (40 mg total) by mouth daily., Disp: 90 capsule, Rfl: 4   tirzepatide (MOUNJARO) 7.5 MG/0.5ML Pen, Inject 7.5 mg into the skin once a week., Disp: 6 mL, Rfl: 1   venlafaxine XR (EFFEXOR-XR) 150 MG 24 hr capsule, Take 150 mg by mouth daily., Disp: , Rfl:    venlafaxine XR (EFFEXOR-XR) 75 MG 24 hr capsule, Take 75 mg by mouth daily., Disp: , Rfl:    vitamin C (ASCORBIC ACID) 500 MG tablet, Take 500 mg by mouth at  bedtime., Disp: , Rfl:    VOLNEA 0.15-0.02/0.01 MG (21/5) tablet, TAKE 1 TABLET BY MOUTH EVERY DAY. TAKE ACTIVE TABLETS ONLY, SKIP PLACEBO TABLETS., Disp: 140 tablet, Rfl: 3      Objective:    BP 92/64 (BP Location: Left Arm, Patient Position: Sitting, Cuff Size: Large)   Pulse (!) 115   Temp 98 F (36.7 C) (Temporal)   Ht 5\' 5"  (1.651 m)   Wt (!) 325 lb 9.6 oz (147.7 kg)   SpO2 98%   BMI 54.18 kg/m   Wt Readings from Last 3 Encounters:  10/15/22 (!) 325 lb 9.6 oz (147.7 kg)  07/15/22 (!) 319 lb 3.2 oz (144.8 kg)  06/11/22 (!) 325 lb 9.6 oz (147.7 kg)    Physical Exam Constitutional:       General: She is not in acute distress.    Appearance: Normal appearance. She is normal weight. She is not ill-appearing, toxic-appearing or diaphoretic.  HENT:     Head: Normocephalic.  Cardiovascular:     Rate and Rhythm: Normal rate.  Pulmonary:     Effort: Pulmonary effort is normal.  Musculoskeletal:        General: Normal range of motion.  Neurological:     General: No focal deficit present.     Mental Status: She is alert and oriented to person, place, and time. Mental status is at baseline.  Psychiatric:        Attention and Perception: Attention and perception normal.        Mood and Affect: Mood normal. Affect is flat.        Speech: Speech is delayed.        Behavior: Behavior normal. Behavior is cooperative.        Thought Content: Thought content normal. Thought content does not include homicidal or suicidal plan.        Cognition and Memory: Memory normal.        Judgment: Judgment normal.    Wt Readings from Last 3 Encounters:  10/15/22 (!) 325 lb 9.6 oz (147.7 kg)  07/15/22 (!) 319 lb 3.2 oz (144.8 kg)  06/11/22 (!) 325 lb 9.6 oz (147.7 kg)            Assessment & Plan:  Insulin resistance -     Tirzepatide; Inject 7.5 mg into the skin once a week.  Dispense: 6 mL; Refill: 1  PCOS (polycystic ovarian syndrome) -     Tirzepatide; Inject 7.5 mg into the skin once a week.  Dispense: 6 mL; Refill: 1  Metabolic syndrome -     Tirzepatide; Inject 7.5 mg into the skin once a week.  Dispense: 6 mL; Refill: 1  Abnormal weight gain -     TSH -     T4, free  Other fatigue -     TSH -     T4, free -     Vitamin B12  Autism spectrum disorder Assessment & Plan: Pt requiring completion of note for acceptance for insurance for medical sufficiency.  Due to chronic condition from childhood she is dependent for ADL's.  She is limited in social settings at times     Vegetarian diet Assessment & Plan: Doing much better with change in diet.  Did suggest  that she start MVI continue iron and start B12 500 mcg once daily    Class 3 severe obesity due to excess calories with serious comorbidity and body mass index (BMI) of 50.0 to 59.9 in adult [facility]) Assessment &  Plan: Increase mounjaro 7.5 mg once weekly from 5.0  Pt advised to work on diet and exercise as tolerated    Encounter for immunization -     Flu vaccine trivalent PF, 6mos and older(Flulaval,Afluria,Fluarix,Fluzone)     Return in about 6 months (around 04/15/2023) for f/u weight loss medication.  Mort Sawyers, MSN, APRN, FNP-C [facility]

## 2022-10-15 NOTE — Telephone Encounter (Signed)
Completed today during office visit and handed to patient and mom.

## 2022-10-15 NOTE — Patient Instructions (Addendum)
  Recommendation for vitamin b12 500 mcg once daily  Multivitamin once daily.    Regards,   Mort Sawyers FNP-C

## 2022-10-15 NOTE — Assessment & Plan Note (Signed)
Increase mounjaro 7.5 mg once weekly from 5.0  Pt advised to work on diet and exercise as tolerated

## 2022-10-15 NOTE — Assessment & Plan Note (Signed)
Pt requiring completion of note for acceptance for insurance for medical sufficiency.  Due to chronic condition from childhood she is dependent for ADL's.  She is limited in social settings at times

## 2022-10-15 NOTE — Assessment & Plan Note (Signed)
Doing much better with change in diet.  Did suggest that she start MVI continue iron and start B12 500 mcg once daily

## 2022-10-28 ENCOUNTER — Ambulatory Visit: Admitting: Registered"

## 2022-12-02 ENCOUNTER — Encounter: Payer: Self-pay | Admitting: Physician Assistant

## 2022-12-09 ENCOUNTER — Encounter: Payer: Self-pay | Admitting: Registered"

## 2022-12-09 ENCOUNTER — Encounter: Payer: MEDICAID | Attending: Pediatric Endocrinology | Admitting: Registered"

## 2022-12-09 DIAGNOSIS — F50819 Binge eating disorder, unspecified: Secondary | ICD-10-CM | POA: Diagnosis present

## 2022-12-09 NOTE — Patient Instructions (Addendum)
-   Have consistent appointments with therapist.   - Continue to aim for at least 3 food groups with each meal.   - Aim for the 3 meals a day.   - Meals should be no longer than 5 hours apart.   - Snacks should include at least 2 food groups such as, Bell peppers + deviled eggs Carrots + ranch/hummus [place] yogurt  Apples + peanut butter

## 2022-12-09 NOTE — Progress Notes (Signed)
Appointment start time: 8:12 Appointment end time: 8:40  Patient was seen on 12/09/2022 for nutrition counseling pertaining to disordered eating  Primary care provider: Mort Sawyers, FNP Therapist: Darlis Loan (sees once/month, in-person)  ROI: 08/26/2022 Any other medical team members: medication management Parents: mom Eunice Blase), sister [name])   Assessment  Pt arrives alone. 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 States Thanksgiving is not her favorite holiday because it is surrounded by food. States she enjoys being around family. States this will be her first Thanksgiving as vegetarian. States she doesn't like that the holiday makes her feel like she needs to eat a lot but this time feels more confident that she doesn't need to eat a lot because she is vegetarian. 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 States they will have mac and cheese, potatoes, green beans, and possibly rolls that will be served for Thanksgiving that she is looking forward to eating. States she may make a pumpkin pie.   Pt states she has not seen her therapist in a while. States her family has been busy and unable to schedule appt. States things have been going well for her but will probably schedule an appt around Christmas because the holidays are usually hard for her.   Previous appt: Mom reports patient has struggled with PCOS and effects of it over the years.    Eating history: Length of time:  Previous treatments: has seen RD before Goals for RD meetings:   Weight history:  Highest weight:    Lowest weight:  Most consistent weight:   What would you like to weigh: How has weight changed in the past year:   Medical Information:  Changes in hair, skin, nails since ED started:  Chewing/swallowing difficulties:  Reflux or heartburn:  Trouble with teeth:  LMP without the use of hormones:    Weight at that point:  Effect of exercise on menses:    Effect of hormones on menses:  Constipation, diarrhea:  Dizziness/lightheadedness:  Headaches/body aches:  Heart racing/chest pain:  Mood:  Sleep:  Focus/concentration:  Cold intolerance:  Vision changes:   Mental health diagnosis: BED   Dietary assessment: A typical day consists of 2 meals and 3 snacks  Safe foods include: Ramen noodles, cereal, [employer] chocolate milk with special K, soft serve ice cream from [employer], sandwich, water, bell peppers, raw broccoli, chocolate, cookies, candy, chips, bananas, apples, strawberries, kiwi, pineapple, cantaloupe, asparagus, green beans, lettuce, salads (sometimes), tomatoes, cucumbers, chicken, steak, bologna, [place], beef, bread, toast, granola, muffins, waffles/pancakes, potatoes, french fries, corn, baked beans, black beans, pinto beans, pasta, rice, mac and cheese, flip greek yogurt, cheese, Boba tea, shrimp  Avoided foods include: zucchini, onions, carrots, fish, seafood, red sauce, garlic, lemons, orange juice  24 hour recall:  B (7-8 am): 1 c cereal (yogurt and fruit cereal) + [employer]Life chocolate milk    S (1 pm):  L (5 pm): Cava-1/2 bowl (veggies, veggie meatballs, beans, rice)  S:   D (7-8 pm): skipped due to stomach hurting  S:   Beverages: water (5*40 oz; 200 oz), [employer]Life chocolate milk   What Methods Do You Use To Control Your Weight (Compensatory behaviors)?           Restricting (calories, fat, carbs)  SIV  Diet pills  Laxatives  Diuretics  Alcohol or drugs  Exercise (what type)  Food rules or rituals (explain)  Binge  Estimated energy intake: 800-900 kcal  Estimated energy needs: 2000-2200 kcal 225-248 g CHO 150-165 g pro 56-61 g fat  Nutrition Diagnosis: NB-1.5 Disordered eating pattern As related to binge eating disorder.  As evidenced by intake reflecting an imbalance of food groups.  Intervention/Goals: Pt was encouraged with changes made  from previous visit. Reminded and discussed the purpose of having adequate meals/snacks and how to implement this into her regimen. Discussed benefits of having continued mental health support when in recovery for BED  Pt agreed with goals listed. Goals: - Have consistent appointments with therapist.  - Continue to aim for at least 3 food groups with each meal.  - Aim for the 3 meals a day.  - Meals should be no longer than 5 hours apart.  - Snacks should include at least 2 food groups such as, Bell peppers + deviled eggs Carrots + ranch/hummus [place] yogurt  Apples + peanut butter   Meal plan:    3 meals    1-3 snacks  Monitoring and Evaluation: Patient will follow up in 8 weeks.

## 2022-12-14 ENCOUNTER — Encounter: Payer: Self-pay | Admitting: Physician Assistant

## 2022-12-14 DIAGNOSIS — R102 Pelvic and perineal pain: Secondary | ICD-10-CM | POA: Diagnosis not present

## 2022-12-17 ENCOUNTER — Ambulatory Visit: Payer: MEDICAID | Admitting: Clinical

## 2023-01-01 DIAGNOSIS — F900 Attention-deficit hyperactivity disorder, predominantly inattentive type: Secondary | ICD-10-CM | POA: Diagnosis not present

## 2023-01-01 DIAGNOSIS — F341 Dysthymic disorder: Secondary | ICD-10-CM | POA: Diagnosis not present

## 2023-01-01 DIAGNOSIS — F411 Generalized anxiety disorder: Secondary | ICD-10-CM | POA: Diagnosis not present

## 2023-01-01 DIAGNOSIS — F840 Autistic disorder: Secondary | ICD-10-CM | POA: Diagnosis not present

## 2023-01-11 ENCOUNTER — Encounter: Payer: Self-pay | Admitting: Family

## 2023-01-11 DIAGNOSIS — E282 Polycystic ovarian syndrome: Secondary | ICD-10-CM

## 2023-01-11 DIAGNOSIS — Z3041 Encounter for surveillance of contraceptive pills: Secondary | ICD-10-CM

## 2023-01-12 NOTE — Telephone Encounter (Signed)
Not sure whom this needs to be directed to.  Trouble with prior auth per note due to NPI issue.

## 2023-01-14 ENCOUNTER — Other Ambulatory Visit (HOSPITAL_COMMUNITY): Payer: Self-pay

## 2023-01-14 ENCOUNTER — Encounter: Payer: Self-pay | Admitting: Physician Assistant

## 2023-01-14 ENCOUNTER — Telehealth: Payer: Self-pay

## 2023-01-14 NOTE — Telephone Encounter (Signed)
 Pharmacy Patient Advocate Encounter   Received notification from Patient Advice Request messages that prior authorization for Mounjaro  is required/requested.   Insurance verification completed.   The patient is insured through [facility] .   Per test claim:  Wegovy  (as pt is not diabetic) is preferred by the insurance.  If suggested medication is appropriate, Please send in a new RX and discontinue this one. If not, please advise as to why it's not appropriate so that we may request a Prior Authorization. Please note, some preferred medications may still require a PA  Answered and routed in original encounter.

## 2023-01-14 NOTE — Telephone Encounter (Signed)
 I see that PA was recently approved through [employer] [employer], however, pt is not type 2 diabetic. Pt must have labs showing A1c of 6.5 or higher for coverage of Mounjaro . Zepbound  is non preferred by pt's medicaid, Wegovy  is preferred. Please consider changing if clinically appropriate or advise. Thank you

## 2023-01-17 MED ORDER — DESOGESTREL-ETHINYL ESTRADIOL 0.15-0.02/0.01 MG (21/5) PO TABS: ORAL_TABLET | ORAL | 0 refills | Status: DC

## 2023-01-17 MED ORDER — TIRZEPATIDE-WEIGHT MANAGEMENT 5 MG/0.5ML ~~LOC~~ SOLN
5.0000 mg | SUBCUTANEOUS | 0 refills | Status: DC
Start: 2023-01-17 — End: 2023-01-25

## 2023-01-17 NOTE — Addendum Note (Signed)
 Addended by: Mort Sawyers on: 01/17/2023 07:10 PM   Modules accepted: Orders

## 2023-01-18 ENCOUNTER — Telehealth: Payer: Self-pay

## 2023-01-18 ENCOUNTER — Other Ambulatory Visit (HOSPITAL_COMMUNITY): Payer: Self-pay

## 2023-01-18 NOTE — Telephone Encounter (Signed)
 Will get her in office.

## 2023-01-18 NOTE — Telephone Encounter (Signed)
 Pharmacy Patient Advocate Encounter   Received notification from Patient Advice Request messages that prior authorization for Zepbound  is required/requested.   Insurance verification completed.   The patient is insured through [facility] .   Per test claim: PA required; However, NEW/RECENT labs/notes are needed to complete & submit PA request. Please see below.       Communicated with PCP via original request, waiting on answer to continue PA

## 2023-01-18 NOTE — Telephone Encounter (Signed)
 Pharmacy Patient Advocate Encounter   Received notification from Patient Advice Request messages that prior authorization for Zepbound  is required/requested.   Insurance verification completed.   The patient is insured through [facility] .   Per test claim: PA required; However, NEW/RECENT labs/notes are needed to complete & submit PA request. Please see below.

## 2023-01-20 ENCOUNTER — Inpatient Hospital Stay: Payer: Medicaid Other | Admitting: Hematology and Oncology

## 2023-01-20 ENCOUNTER — Other Ambulatory Visit (HOSPITAL_COMMUNITY): Payer: Self-pay

## 2023-01-20 ENCOUNTER — Ambulatory Visit: Payer: Medicaid Other | Admitting: Family

## 2023-01-20 ENCOUNTER — Telehealth: Payer: Self-pay | Admitting: Hematology and Oncology

## 2023-01-20 ENCOUNTER — Encounter: Payer: Self-pay | Admitting: Physician Assistant

## 2023-01-20 ENCOUNTER — Inpatient Hospital Stay: Payer: Medicaid Other

## 2023-01-20 DIAGNOSIS — Z6841 Body Mass Index (BMI) 40.0 and over, adult: Secondary | ICD-10-CM

## 2023-01-20 NOTE — Assessment & Plan Note (Signed)
 P/a being submitted to zepbound . Insurance wouldn't cover mounjaro  (FDA would not approve also due to pt not being diabetic), wegovy  was not tolerated.  Pending results of p/a If not approved could potentially still consider wegovy  at low dose start, might have been too high to start at prior dose change.   Discussed in detail as well diet and exercise specifically strength training to gain muscle to metabolize.

## 2023-01-20 NOTE — Telephone Encounter (Signed)
 Pt has been seen in office , weight and BMI documented. Failure wegovy with undesired side effects so can we try to proceed with zepbound p/a

## 2023-01-20 NOTE — Telephone Encounter (Signed)
 Absolutely! I just needed the updated weight, PA has been submitted, fingers crossed! I'll update you as soon as I hear back. Thanks

## 2023-01-20 NOTE — Telephone Encounter (Signed)
 Pharmacy Patient Advocate Encounter   Received notification from Patient Advice Request messages that prior authorization for Zepbound  is required/requested.   Insurance verification completed.   The patient is insured through [facility] .   Per test claim: PA required; PA submitted to above mentioned insurance via CoverMyMeds Key/confirmation #/EOC [id number] Status is pending

## 2023-01-20 NOTE — Progress Notes (Signed)
 Established Patient Office Visit  Subjective:      CC:  Chief Complaint  Patient presents with   Medical Management of Chronic Issues    HPI: Sarah Shah is a 23 y.o. female presenting on 01/20/2023 for Medical Management of Chronic Issues  Obesity: zepbound  in process of approval however they needed documented BMI and weight within the last 45 days. She was taking mounjaro  however insurance didn't approve it again because she is not diabetic. She did try wegovy  at one time, however switched from mounjaro  5 to 7.5 mg and she was having nausea and vomiting so she stopped. She is currently a vegetarian.   Exercise: has been hindered from fatigue from anemia. She is trying to gain some energy back. She does have a membership at a gym but her transportation has been limited which hasn't been helpful for her sustaining this process. She is going to revisit this and try to work on a gym plan.   Gynecology, Dr. Jolene Gaskins. 12/14/22 pap negative for pt and mom.  Transvaginal u/s was unremarkable per pt as well.   Social history:  Relevant past medical, surgical, family and social history reviewed and updated as indicated. Interim medical history since our last visit reviewed.  Allergies and medications reviewed and updated.  DATA REVIEWED: CHART IN EPIC     ROS: Negative unless specifically indicated above in HPI.    Current Outpatient Medications:    busPIRone  (BUSPAR ) 30 MG tablet, Take 30 mg by mouth 2 (two) times daily., Disp: , Rfl:    cloNIDine  (CATAPRES ) 0.2 MG tablet, Take 0.2 mg by mouth 2 (two) times daily., Disp: , Rfl:    desogestrel -ethinyl estradiol  (VOLNEA ) 0.15-0.02/0.01 MG (21/5) tablet, TAKE 1 TABLET BY MOUTH EVERY DAY. TAKE ACTIVE TABLETS ONLY, SKIP PLACEBO TABLETS., Disp: 90 tablet, Rfl: 0   docusate sodium  (COLACE) 100 MG capsule, Take 100 mg by mouth at bedtime., Disp: , Rfl:    famotidine  (PEPCID ) 20 MG tablet, Take 20 mg by mouth daily., Disp: , Rfl:     ferrous sulfate  325 (65 FE) MG tablet, Take 325 mg by mouth at bedtime., Disp: , Rfl:    omeprazole  (PRILOSEC) 40 MG capsule, Take 1 capsule (40 mg total) by mouth daily., Disp: 90 capsule, Rfl: 4   tirzepatide  5 MG/0.5ML injection vial, Inject 5 mg into the skin once a week., Disp: 2 mL, Rfl: 0   venlafaxine  XR (EFFEXOR -XR) 150 MG 24 hr capsule, Take 150 mg by mouth daily., Disp: , Rfl:    venlafaxine  XR (EFFEXOR -XR) 75 MG 24 hr capsule, Take 75 mg by mouth daily., Disp: , Rfl:    vitamin C  (ASCORBIC ACID ) 500 MG tablet, Take 500 mg by mouth at bedtime., Disp: , Rfl:       Objective:    BP 116/74 (BP Location: Left Arm, Patient Position: Sitting, Cuff Size: Large)   Pulse 88   Temp 98.1 F (36.7 C) (Temporal)   Ht 5' 5 (1.651 m)   Wt (!) 332 lb 9.6 oz (150.9 kg)   SpO2 98%   BMI 55.35 kg/m   Wt Readings from Last 3 Encounters:  01/20/23 (!) 332 lb 9.6 oz (150.9 kg)  10/15/22 (!) 325 lb 9.6 oz (147.7 kg)  07/15/22 (!) 319 lb 3.2 oz (144.8 kg)    Physical Exam Constitutional:      General: She is not in acute distress.    Appearance: Normal appearance. She is normal weight. She is not ill-appearing,  toxic-appearing or diaphoretic.  HENT:     Head: Normocephalic.  Cardiovascular:     Rate and Rhythm: Normal rate.  Pulmonary:     Effort: Pulmonary effort is normal.  Musculoskeletal:        General: Normal range of motion.  Neurological:     General: No focal deficit present.     Mental Status: She is alert and oriented to person, place, and time. Mental status is at baseline.  Psychiatric:        Mood and Affect: Mood normal.        Behavior: Behavior normal.        Thought Content: Thought content normal.        Judgment: Judgment normal.           Assessment & Plan:  Morbid obesity (HCC) Assessment & Plan: P/a being submitted to zepbound . Insurance wouldn't cover mounjaro  (FDA would not approve also due to pt not being diabetic), wegovy  was not tolerated.   Pending results of p/a If not approved could potentially still consider wegovy  at low dose start, might have been too high to start at prior dose change.   Discussed in detail as well diet and exercise specifically strength training to gain muscle to metabolize.       Return if symptoms worsen or fail to improve.  Ginger Patrick, MSN, APRN, FNP-C [facility]

## 2023-01-20 NOTE — Telephone Encounter (Signed)
 Pharmacy Patient Advocate Encounter  Received notification from [facility] that Prior Authorization for Zepbound  5mg /0.5 ml pens has been APPROVED from 01/20/23 to 07/19/23   [facility] Key #: [id number]  *Rx was written for vials but they are currently unavailable and not covered by the insurance, however the pens have been approved through the insurance. Please send in new Rx for the pens to patients pharmacy.

## 2023-01-21 ENCOUNTER — Encounter: Payer: Self-pay | Admitting: Physician Assistant

## 2023-01-25 MED ORDER — TIRZEPATIDE-WEIGHT MANAGEMENT 2.5 MG/0.5ML ~~LOC~~ SOAJ
2.5000 mg | SUBCUTANEOUS | 0 refills | Status: DC
Start: 2023-01-25 — End: 2023-02-17

## 2023-01-25 NOTE — Addendum Note (Signed)
 Addended by: Mort Sawyers on: 01/25/2023 07:50 AM   Modules accepted: Orders

## 2023-01-26 ENCOUNTER — Ambulatory Visit: Payer: Medicaid Other | Admitting: Clinical

## 2023-01-26 DIAGNOSIS — F419 Anxiety disorder, unspecified: Secondary | ICD-10-CM | POA: Diagnosis not present

## 2023-01-26 DIAGNOSIS — F840 Autistic disorder: Secondary | ICD-10-CM | POA: Diagnosis not present

## 2023-01-26 DIAGNOSIS — F32A Depression, unspecified: Secondary | ICD-10-CM

## 2023-01-26 NOTE — Progress Notes (Signed)
 [facility] Counselor/Therapist Progress Note  Patient ID: Sarah Shah, MRN: [id number]    Date: 01/26/23  Time Spent: 2:04 pm - 2:59 pm: 55 Minutes  Type of Service Provided Individual Therapy  Type of Contact in-person Location: office   Mental Status Exam: Appearance:  Casual and Fairly Groomed     Behavior: Sharing and Blaming  Motor: Normal  Speech/Language:  Clear and Coherent and Normal Rate  Affect: Congruent  Mood: Variable - Slightly irritated/frustrated, slightly sad, neutral   Thought process: normal  Thought content:   WNL  Sensory/Perceptual disturbances:   WNL  Orientation: oriented to person, place, time/date, and situation  Attention: Good  Concentration: Good  Memory: WNL  Fund of knowledge:  Fair  Insight:   Fair  Judgment:  Fair  Impulse Control: Fair   Risk Assessment:  Danger to Self:   denied SI several times Self-injurious Behavior:  yes - Sarah Shah reported that last week she engaged in one incident of cutting. She cut herself with a razor on the backside of her hand/wrist.  She indicated that she regretted doing it as soon as she cut. She denied that she was trying to kill herself. Her mother is aware. She showed the cut to the therapist and it looked to be a shallow cut near her hand. She had one SIB thought since then but did not act on it. Safety was reviewed including alternative behaviors to cutting, giving all sharps to her parent, and behavioral health urgent care/911/ED if needed. Sarah Shah indicated that she could keep herself safe between now and the next session and agreed to remove potentially dangerous objects and go to behavioral health urgent care if needed.   Danger to Others: No Duty to Warn:no  Presenting Problems, Reported Symptoms, and /or Interim History: Sarah Shah presented for a session to address depression.   Subjective: Sarah Shah presented for an individual outpatient therapy session. The following was addressed during  sessions.   Sarah Shah reported an increase in depression. She has been in a down mood, feels badly about herself, has been feeling guilty, and has been crying. She has been eating more but has not experienced a change in her sleep. She has lost enjoyment in some things that she used to enjoy but not everything. Sarah Shah reported that her change in eating may be related to her new AD/HD medication. She was encouraged to talk to her prescriber about this. She denied any suicidal thoughts but did cut once in the time between visits. Recent stressors have include her family, not being able to go places on her own, and failing a class last semester. She also feels like no one wants her around at home. Strategies that she could use to help manage her mood were discussed with Sarah Shah coming up with going outside with her dog, having a regular schedule due to school (she started school again last week), and making a plan with her sister to go to the gym. Sarah Shah also set of self-goal of driving in [id number]. Helpful thoughts were also generated.     Interventions/Psychotherapy Techniques Used During Session: Cognitive Behavioral Therapy, life skills, solution focused  Diagnosis: Depression, unspecified depression type  Autism spectrum disorder  Anxiety  MENTAL HEALTH INTERVENTIONS USED DURING TREATMENT & PATIENT'S RESPONSE TO INTERVENTIONS:  Short-term Objective addressed today:Sarah Shah will be able to use appropriate coping strategies including helpful thoughts to maintain a low level of anxiety and depressive symptoms.Mental health techniques used: Objective was addressed in session through the use  of  Cognitive Behavioral Therapy and discussion. Lashya's response to the discussion was positive and she reported feeling better afterward. Progress Toward Goal: increase in symptoms of depression  Short-term Objective addressed today:Sarah Shah will be able to create a plan to address weaknesses described in objective 3A (driving was  discussed today) Mental health techniques used: Objective was addressed in session through the use of  solution focused and life skills , and discussion. Sarah Shah's response was positive. Progress Toward Goal: some progress    PLAN  1. Sarah Shah will return for a therapy session.   2. Homework Given:  engage in activities and coping skills to try to manage symptoms of depression. This homework will be reviewed with Sarah Shah at the next visit.  3. During the next session check in on mood, SIB, anxiety, school.     [name], [name]  Individual Treatment Plan  - please see the notes from 04/17/2021 & 05/01/2021 for the initial treatment plan, the updates in Winter 2024, and the goal review from 06/02/2022 for complete treatment plan information.    Problem/Need: Sarah Shah has a history of anxiety and mood concerns - with some ongoing challenges in these areas  Long-Term Goal #1: Sarah Shah will be able to identify and address any mood or anxiety challenges and will maintain a low level of anxiety and depression symptoms as evidenced by self-report. Short-Term Objectives: Objective 1A: Sarah Shah will be able to rate her mood and anxiety - GOAL MET  Objective 1B: Sarah Shah will be able to identify any negative thinking patterns that are perpetuating anxiety or depressive symptoms and replace these with more adaptive thoughts. - GOAL Partially met  Objective 1C: Sarah Shah will be able to use appropriate coping strategies including helpful thoughts to maintain a low level of anxiety and depressive symptoms. Interventions: Cognitive Behavioral Therapy, Psychologist, Occupational, Motivational Interviewing, and Psycho-education/Bibliotherapy  coping skills and other evidenced-based practices will be used to promote progress towards healthy functioning and to help manage decrease symptoms associated with their diagnosis.  Treatment Regimen: Individual skill building sessions every 2-4 weeks to address treatment goal/objective Target Date:  05/2023 Responsible Party: therapist and patient Person delivering treatment: Licensed Psychologist [name], [name] will support the patient's ability to achieve the goals identified. Resolved:  No - Janayia has made some progress, but mood and anxiety continue to be variable - goal continued   Problem/Need: Life transition - Margeart is in the process of completing a major life transition including graduating from high school, starting college, and potentially working. - As of 06/02/2022 Takirah has graduated from high school and successfully completed 2 semesters of college. However she continues to have life transitions, so the problem/need section has been updated  Updated Problem/Need: Life transition - Kanyon has continuing life transitions including working towards completing her college program and getting a job.  Long-Term Goal #2: Hannalee will successfully navigate these life transitions including being able to manage her stress level. Short-Term Objectives: Objective 2A: Malie will be able to identify stressors associated with the life transitions - GOAL MET  - stressors include time management, test taking, and expanding her social circle  Objective 2B: Cartha will be able to identify strategies to help navigate stressors associated with the life transitions and implement them  Interventions: Assertiveness/Communication, Psychologist, Occupational, and Psycho-education/Bibliotherapy , and other evidenced-based practices will be used to promote progress towards healthy functioning and to help manage decrease symptoms associated with their diagnosis.  Treatment Regimen: Individual skill building sessions every 2-4 weeks  to address treatment goal/objective Target Date: 05/2023 Responsible Party: therapist and patient Person delivering treatment: Licensed Psychologist [name], [name] will support the patient's ability to achieve the goals identified. Resolved:  No  Problem/Need: Life skills - Gaetana  needs to develop some additional skills to facilitate independent living. Long-Term Goal #3: Lyne will identify the additional life skills necessary to facilitate independent living. Short-Term Objectives: Objective 3A: Nashalie will be able to identify areas that she needs to address to increase the likelihood of her being able to successfully live independently.  - GOAL MET  - areas identified so far (as of 06/02/2022) include money management, cooking, and transportation   Objective 3B: Gustie will be able to create a plan to address weaknesses described in objective 3A.   Objective 3C: Sandie will be able to implement steps on the plan developed in objective 3B. Interventions: Motivational Interviewing, Solution-Oriented/Positive Psychology, and Psycho-education/Bibliotherapy  daily life skills, and other evidenced-based practices will be used to promote progress towards healthy functioning and to help manage decrease symptoms associated with their diagnosis.  Treatment Regimen: Individual skill building sessions every 2-4 weeks to address treatment goal/objective Target Date: 05/2023 Responsible Party: therapist and patient Person delivering treatment: Licensed Psychologist [name], [name] will support the patient's ability to achieve the goals identified. Resolved:  No - Erandy has made some strides towards identifying areas that need to be addressed to facilitate independent living and made some progress on developing those skills, but process continues     [name], [name]

## 2023-01-28 ENCOUNTER — Other Ambulatory Visit: Payer: Self-pay | Admitting: Hematology and Oncology

## 2023-01-28 ENCOUNTER — Inpatient Hospital Stay: Payer: Medicaid Other | Attending: Hematology and Oncology

## 2023-01-28 ENCOUNTER — Inpatient Hospital Stay: Payer: Medicaid Other | Admitting: Hematology and Oncology

## 2023-01-28 DIAGNOSIS — E611 Iron deficiency: Secondary | ICD-10-CM

## 2023-01-28 NOTE — Progress Notes (Deleted)
[facility] [phone]   [phone]  PROGRESS NOTE  Patient Care Team: Mort Sawyers, FNP as PCP - General (Family Medicine)  CHIEF COMPLAINTS/PURPOSE OF CONSULTATION:  "Iron deficiency "  CURRENT THERAPY: Ferrous sulfate 325 mg once daily IV Feraheme as needed. Received 3 doses. Last dose on 10/07/2020.   HISTORY OF PRESENTING ILLNESS:  Sarah Shah 23 y.o. female who returns for a follow up for iron deficiency. She is accompanied by her mother for this visit.   On exam today, Sarah Shah reports she has been well in the 3 months since her last visit.  She reports that she is taking iron pills and tolerating them well.  She is not having any issues with stomach upset or constipation.  She is taking these with her vitamin C and docusate.  She reports her energy is still quite low.  She started going to the gym and doing swimming classes and walking but has not lost much in the way of weight.  She reports that she has been doing "so-so".  She reports she is doing her best to try to eat iron rich foods.  She continues to have no menstrual cycles because of her treatment for PCOS.  She is not having any other overt signs of bleeding.  She denies any fevers, chills, night sweats, shortness of breath, chest pain or cough.  She has no other complaints.  Rest the 10 point ROS is below  MEDICAL HISTORY:  Past Medical History:  Diagnosis Date   ADHD (attention deficit hyperactivity disorder)    Anemia    Anxiety    Autism spectrum    Constipated    Constipation    Depression    Development delay    GERD (gastroesophageal reflux disease)    Headache    Insulin resistance    Iron deficiency    Learning disability    Obesity    ODD (oppositional defiant disorder)    PCOS (polycystic ovarian syndrome)    PMDD (premenstrual dysphoric disorder)    Pneumonia    3 mos old and 16 mos old   Visual acuity reduced    glasses    SURGICAL  HISTORY: Past Surgical History:  Procedure Laterality Date   BIOPSY  02/17/2021   Procedure: BIOPSY;  Surgeon: Hilarie Fredrickson, MD;  Location: [facility];  Service: Endoscopy;;   CHOLECYSTECTOMY  09/17/2021   COLONOSCOPY WITH PROPOFOL N/A 02/17/2021   Procedure: COLONOSCOPY WITH PROPOFOL;  Surgeon: Hilarie Fredrickson, MD;  Location: [facility];  Service: Endoscopy;  Laterality: N/A;   ESOPHAGOGASTRODUODENOSCOPY (EGD) WITH PROPOFOL N/A 02/17/2021   Procedure: ESOPHAGOGASTRODUODENOSCOPY (EGD) WITH PROPOFOL;  Surgeon: Hilarie Fredrickson, MD;  Location: [facility];  Service: Endoscopy;  Laterality: N/A;   TOOTH EXTRACTION N/A 01/03/2018   Procedure: SURGICAL EXTRACTION OF TEETH #1, 16, 17, 32;  Surgeon: Vivia Ewing, DMD;  Location: [facility] OR;  Service: Oral Surgery;  Laterality: N/A;    SOCIAL HISTORY: Social History   Socioeconomic History   Marital status: Single    Spouse name: Not on file   Number of children: 0   Years of education: Not on file   Highest education level: Some college, no degree  Occupational History   Occupation: student  Tobacco Use   Smoking status: Never   Smokeless tobacco: Never  Vaping Use   Vaping status: Never Used  Substance and Sexual Activity   Alcohol use: No   Drug use: Never   Sexual  activity: Never    Birth control/protection: Pill  Other Topics Concern   Not on file  Social History Narrative   Will start 12th grade at [employer] for the 22/23 school year.       07/20/19   Enjoys: sleep, paints, sewing (sock dolls), animal care   From: born in [place] but moved her when she was little   Who is at home: with mom Stanton Kidney, stepdad Thayer Ohm, and sister [place]   Pets: loves her [place] pigs, Careers information officer, ducks, dogs, classroom bunny   School: [employer]    Grade: 11th grade this fall      Family: good relationship with mom and family       Exercise: walking with mom    Diet: avoiding reflux worsening food      Safety   Seat belts: Yes     Guns: Yes  and secure   Safe in relationships: Yes    Helmets: Yes    Smoke Exposure at home: No   Bullying: Yes  and knows who she can ask for help and feels   Social Drivers of Health   Financial Resource Strain: Low Risk  (01/20/2023)   Overall Financial Resource Strain (CARDIA)    Difficulty of Paying Living Expenses: Not hard at all  Food Insecurity: No Food Insecurity (01/20/2023)   Hunger Vital Sign    Worried About Running Out of Food in the Last Year: Never true    Ran Out of Food in the Last Year: Never true  Transportation Needs: No Transportation Needs (01/20/2023)   PRAPARE - Administrator, Civil Service (Medical): No    Lack of Transportation (Non-Medical): No  Physical Activity: Insufficiently Active (01/20/2023)   Exercise Vital Sign    Days of Exercise per Week: 2 days    Minutes of Exercise per Session: 10 min  Stress: No Stress Concern Present (01/20/2023)   [employer] of Occupational Health - Occupational Stress Questionnaire    Feeling of Stress : Only a little  Social Connections: Moderately Isolated (01/20/2023)   Social Connection and Isolation Panel [NHANES]    Frequency of Communication with Friends and Family: More than three times a week    Frequency of Social Gatherings with Friends and Family: Twice a week    Attends Religious Services: 1 to 4 times per year    Active Member of [employer] or Organizations: No    Attends Engineer, structural: Not on file    Marital Status: Never married  Catering manager Violence: Not on file    FAMILY HISTORY: Family History  Problem Relation Age of Onset   Diabetes Mother    Hypertension Mother    Anxiety disorder Mother    Other Mother        Premenstrual dysphoic disorder   Obesity Mother    Stroke Mother    Colon polyps Mother    Irritable bowel syndrome Mother    Diabetes Father        type 1   Depression Father    Anxiety disorder Father    Obesity Father    Anxiety disorder  Sister    ADD / ADHD Sister        ADD   Other Sister        Premenstrual dysphoric disorder   Atrial fibrillation Maternal Grandmother    Diabetes Maternal Grandmother    Heart failure Maternal Grandmother    Hypertension Maternal Grandfather    Iron  deficiency Maternal Aunt    Breast cancer Maternal Aunt     ALLERGIES:  is allergic to wound dressing adhesive and semaglutide.  MEDICATIONS:  Current Outpatient Medications  Medication Sig Dispense Refill   busPIRone (BUSPAR) 30 MG tablet Take 30 mg by mouth 2 (two) times daily.     cloNIDine (CATAPRES) 0.2 MG tablet Take 0.2 mg by mouth 2 (two) times daily.     desogestrel-ethinyl estradiol (VOLNEA) 0.15-0.02/0.01 MG (21/5) tablet TAKE 1 TABLET BY MOUTH EVERY DAY. TAKE ACTIVE TABLETS ONLY, SKIP PLACEBO TABLETS. 90 tablet 0   docusate sodium (COLACE) 100 MG capsule Take 100 mg by mouth at bedtime.     famotidine (PEPCID) 20 MG tablet Take 20 mg by mouth daily.     ferrous sulfate 325 (65 FE) MG tablet Take 325 mg by mouth at bedtime.     omeprazole (PRILOSEC) 40 MG capsule Take 1 capsule (40 mg total) by mouth daily. 90 capsule 4   tirzepatide (ZEPBOUND) 2.5 MG/0.5ML Pen Inject 2.5 mg into the skin once a week. 2 mL 0   venlafaxine XR (EFFEXOR-XR) 150 MG 24 hr capsule Take 150 mg by mouth daily.     venlafaxine XR (EFFEXOR-XR) 75 MG 24 hr capsule Take 75 mg by mouth daily.     vitamin C (ASCORBIC ACID) 500 MG tablet Take 500 mg by mouth at bedtime.     No current facility-administered medications for this visit.    REVIEW OF SYSTEMS:   Constitutional: ( - ) fevers, ( - )  chills , ( - ) night sweats Eyes: ( - ) blurriness of vision, ( - ) double vision, ( - ) watery eyes Ears, nose, mouth, throat, and face: ( - ) mucositis, ( - ) sore throat Respiratory: ( - ) cough, ( - ) dyspnea, ( - ) wheezes Cardiovascular: ( - ) palpitation, ( - ) chest discomfort, ( - ) lower extremity swelling Gastrointestinal:  ( - ) nausea, ( - )  heartburn, ( - ) change in bowel habits Skin: ( - ) abnormal skin rashes Lymphatics: ( - ) new lymphadenopathy, ( - ) easy bruising Neurological: ( - ) numbness, ( - ) tingling, ( - ) new weaknesses Behavioral/Psych: ( - ) mood change, ( - ) new changes  All other systems were reviewed with the patient and are negative.  PHYSICAL EXAMINATION: Not performed due to virtual visit   LABORATORY DATA:  I have reviewed the data as listed    Latest Ref Rng & Units 07/15/2022    2:05 PM 05/28/2022    9:06 AM 04/10/2022    5:16 PM  CBC  WBC 4.0 - 10.5 K/uL 7.7  8.3  7.7   Hemoglobin 12.0 - 15.0 g/dL 13.0  86.5  78.4   Hematocrit 36.0 - 46.0 % 42.3  40.5  42.9   Platelets 150 - 400 K/uL 321  296.0  348        Latest Ref Rng & Units 05/28/2022    9:06 AM 04/10/2022    5:16 PM 03/05/2022   10:54 PM  CMP  Glucose 70 - 99 mg/dL 94  81  81   BUN 6 - 23 mg/dL 15  15  7    Creatinine 0.40 - 1.20 mg/dL 6.96  2.95  2.84   Sodium 135 - 145 mEq/L 139  139  135   Potassium 3.5 - 5.1 mEq/L 3.9  3.9  3.4   Chloride 96 - 112 mEq/L 105  104  105   CO2 19 - 32 mEq/L 24  22  18    Calcium 8.4 - 10.5 mg/dL 9.4  08.6  9.4   Total Protein 6.0 - 8.3 g/dL 6.5  7.9  7.3   Total Bilirubin 0.2 - 1.2 mg/dL 0.2  0.4  0.5   Alkaline Phos 39 - 117 U/L 81  102  91   AST 0 - 37 U/L 12  19  24    ALT 0 - 35 U/L 12  20  35      ASSESSMENT & PLAN Sarah Shah is a 23 y.o. female who presents to the clinic for iron deficiency without anemia.  #Iron deficiency without anemia: --Received IV feraheme x 3 doses, most recent infusion given on 10/07/2020.  --Labs today show *** --Currently on ferrous sulfate 325 mg once daily. Advised to continue but can take once every other day due to persistent constipation.  -- Patient has marked fatigue despite adequate ferritin levels.  Recommend holding on further IV iron infusions.  Okay to continue iron pills at this time.  #Abdominal pain: --CT scan from 12/10/2020 shows  mesenteric lymph nodes within the mid to lower right abdomen concerning for mesenteric adenitis. Rest of the scan was unremarkable.  --Underwent EGD/colonoscopy on 02/17/2021, normal. Labs did not indicate celiac disease.  --Under the care of [facility] team.   Follow up: --RTC in 6 months with labs  No orders of the defined types were placed in this encounter.    All questions were answered. The patient knows to call the clinic with any problems, questions or concerns.  I have spent a total of 30 minutes minutes of non-face-to-face time, preparing to see the patient, performing a medically appropriate examination, counseling and educating the patient, referring the patient to another medical provider, documenting clinical information in the electronic health record, and care coordination.   Ulysees Barns, MD Department of Hematology/Oncology [facility] at [facility] Phone: [phone] Pager: [phone] Email: [email or web address]

## 2023-01-29 ENCOUNTER — Other Ambulatory Visit (HOSPITAL_COMMUNITY): Payer: Self-pay

## 2023-01-29 DIAGNOSIS — F411 Generalized anxiety disorder: Secondary | ICD-10-CM | POA: Diagnosis not present

## 2023-01-29 DIAGNOSIS — F341 Dysthymic disorder: Secondary | ICD-10-CM | POA: Diagnosis not present

## 2023-01-29 DIAGNOSIS — F900 Attention-deficit hyperactivity disorder, predominantly inattentive type: Secondary | ICD-10-CM | POA: Diagnosis not present

## 2023-01-30 ENCOUNTER — Other Ambulatory Visit (HOSPITAL_COMMUNITY): Payer: Self-pay

## 2023-01-30 MED ORDER — AMPHETAMINE-DEXTROAMPHET ER 30 MG PO CP24
30.0000 mg | ORAL_CAPSULE | Freq: Every morning | ORAL | 0 refills | Status: DC
  Filled 2023-01-30: qty 30, 30d supply, fill #0

## 2023-02-02 ENCOUNTER — Ambulatory Visit: Payer: Medicaid Other | Admitting: Clinical

## 2023-02-02 DIAGNOSIS — F419 Anxiety disorder, unspecified: Secondary | ICD-10-CM

## 2023-02-02 DIAGNOSIS — F840 Autistic disorder: Secondary | ICD-10-CM

## 2023-02-02 DIAGNOSIS — F32A Depression, unspecified: Secondary | ICD-10-CM

## 2023-02-02 NOTE — Progress Notes (Signed)
[facility] Counselor/Therapist Progress Note  Patient ID: Sarah Shah, MRN: [id number]    Date: 02/02/23  Time Spent: 1:03 pm - 1:54 pm: 51 Minutes  Type of Service Provided Individual Therapy  Type of Contact in-person Location: office  Mental Status Exam: Appearance:  Casual and Fairly Groomed     Behavior: Sharing  Motor: Normal  Speech/Language:  Clear and Coherent and Normal Rate  Affect: variable  Mood: Variable, ranging from slightly sad to neutral   Thought process: normal  Thought content:   WNL  Sensory/Perceptual disturbances:   WNL  Orientation: oriented to person, place, time/date, and situation  Attention: Good  Concentration: Good  Memory: WNL  Fund of knowledge:  Fair  Insight:   Fair  Judgment:  Fair  Impulse Control: Good   Risk Assessment:  Danger to Self:  No - denied SI Self-injurious Behavior: No - Sarah Shah had one thought about SIB after being "triggered" by her father yelling at her, but she did not act on the thought and she took appropriate steps to keep herself from acting on them when she was upset (she had crochet scissors near her and gave them to her sister to hold). Once she calmed down, the thought went away and Sarah Shah did not experience any other SIB thoughts.  Danger to Others: No Duty to Warn:no  Presenting Problems, Reported Symptoms, and /or Interim History: Sarah Shah presented for a session to address depression and anxiety.   Subjective: Sarah Shah presented for an individual outpatient therapy session. The following was addressed during sessions.   Sarah Shah reported that her mood was mostly neutral, but she had experienced some sadness since the last visit. Sarah Shah rated her anxiety as a 2 out of 10 and irritation/frustration as a 4 out of 10 (with 10 being high). Sarah Shah discussed a negative experience with her father (he yelled at her and made a negative comment to her) which resulted in her having one SIB thought. However, as  described above she took appropriate steps to keep herself safe and engaged in coping strategies to calm down, and once she was calm the thought went away. Sarah Shah reported that she continues to experience symptoms of depression and is feeling bad about her weight, which has impacted her confidence. Strategies she can use to manage her mood were discussed, as well as behaviors that she may think are helpful but make her depression worse. Sarah Shah reported that she is doing less impulsive buying and has cut her overspending and has been following some previously discussed strategies to curb spending.     Interventions/Psychotherapy Techniques Used During Session: Cognitive Behavioral Therapy, executive functioning skills supports and life skill supports  Diagnosis: Depression, unspecified depression type  Autism spectrum disorder  Anxiety  MENTAL HEALTH INTERVENTIONS USED DURING TREATMENT & PATIENT'S RESPONSE TO INTERVENTIONS:  Short-term Objective addressed today: Sarah Shah will be able to use appropriate coping strategies including helpful thoughts to maintain a low level of anxiety and depressive symptoms. Mental health techniques used: Objective was addressed in session through the use of Cognitive Behavioral Therapy and discussion. Mayla's response was positive. Progress Toward Goal: progressing  Short-term Objective addressed today: Sarah Shah will be able to create a plan to address weaknesses described in objective 3A (in this instance money management) AND Sarah Shah will be able to implement steps on the plan developed in objective 3B. Mental health techniques used: Objective was addressed in session through the use of   executive functioning supports and life skills supports,  and discussion. Sarah Shah's response was positive. Progress Toward Goal: progressing    PLAN  1. Sarah Shah will return for a therapy session.   2. Homework Given:  continue with positive behaviors to help improve mood (e.g., exercise, making  sure she gets up at a reasonable time, completing school work, etc.). This homework will be reviewed with Sarah Shah at the next visit.  3. During the next session check in on mood and anxiety.     Sarah Derby, [name]  Individual Treatment Plan  - please see the notes from 04/17/2021 & 05/01/2021 for the initial treatment plan, the updates in Winter 2024, and the goal review from 06/02/2022 for complete treatment plan information.    Problem/Need: Sarah Shah has a history of anxiety and mood concerns - with some ongoing challenges in these areas  Long-Term Goal #1: Sarah Shah will be able to identify and address any mood or anxiety challenges and will maintain a low level of anxiety and depression symptoms as evidenced by self-report. Short-Term Objectives: Objective 1A: Sarah Shah will be able to rate her mood and anxiety - GOAL MET  Objective 1B: Sarah Shah will be able to identify any negative thinking patterns that are perpetuating anxiety or depressive symptoms and replace these with more adaptive thoughts. - GOAL Partially met  Objective 1C: Sarah Shah will be able to use appropriate coping strategies including helpful thoughts to maintain a low level of anxiety and depressive symptoms. Interventions: Cognitive Behavioral Therapy, Psychologist, occupational, Motivational Interviewing, and Psycho-education/Bibliotherapy  coping skills and other evidenced-based practices will be used to promote progress towards healthy functioning and to help manage decrease symptoms associated with their diagnosis.  Treatment Regimen: Individual skill building sessions every 2-4 weeks to address treatment goal/objective Target Date: 05/2023 Responsible Party: therapist and patient Person delivering treatment: Licensed Psychologist Sarah Derby, [name] will support the patient's ability to achieve the goals identified. Resolved:  No - Sarah Shah has made some progress, but mood and anxiety continue to be variable - goal continued   Problem/Need: Life  transition - Sarah Shah is in the process of completing a major life transition including graduating from high school, starting college, and potentially working. - As of 06/02/2022 Odelle has graduated from high school and successfully completed 2 semesters of college. However she continues to have life transitions, so the problem/need section has been updated  Updated Problem/Need: Life transition - Kaoir has continuing life transitions including working towards completing her college program and getting a job.  Long-Term Goal #2: Jarika will successfully navigate these life transitions including being able to manage her stress level. Short-Term Objectives: Objective 2A: Oralee will be able to identify stressors associated with the life transitions - GOAL MET  - stressors include time management, test taking, and expanding her social circle  Objective 2B: Zurii will be able to identify strategies to help navigate stressors associated with the life transitions and implement them  Interventions: Assertiveness/Communication, Psychologist, occupational, and Psycho-education/Bibliotherapy , and other evidenced-based practices will be used to promote progress towards healthy functioning and to help manage decrease symptoms associated with their diagnosis.  Treatment Regimen: Individual skill building sessions every 2-4 weeks to address treatment goal/objective Target Date: 05/2023 Responsible Party: therapist and patient Person delivering treatment: Licensed Psychologist Sarah Derby, [name] will support the patient's ability to achieve the goals identified. Resolved:  No  Problem/Need: Life skills - Hugh needs to develop some additional skills to facilitate independent living. Long-Term Goal #3: Tabbitha will identify the additional life skills necessary to facilitate independent  living. Short-Term Objectives: Objective 3A: Ashvi will be able to identify areas that she needs to address to increase the likelihood of  her being able to successfully live independently.  - GOAL MET  - areas identified so far (as of 06/02/2022) include money management, cooking, and transportation   Objective 3B: Pearly will be able to create a plan to address weaknesses described in objective 3A.   Objective 3C: Wenona will be able to implement steps on the plan developed in objective 3B. Interventions: Motivational Interviewing, Solution-Oriented/Positive Psychology, and Psycho-education/Bibliotherapy  daily life skills, and other evidenced-based practices will be used to promote progress towards healthy functioning and to help manage decrease symptoms associated with their diagnosis.  Treatment Regimen: Individual skill building sessions every 2-4 weeks to address treatment goal/objective Target Date: 05/2023 Responsible Party: therapist and patient Person delivering treatment: Licensed Psychologist Sarah Derby, [name] will support the patient's ability to achieve the goals identified. Resolved:  No - Jacoya has made some strides towards identifying areas that need to be addressed to facilitate independent living and made some progress on developing those skills, but process continues    Sarah Derby, [name]

## 2023-02-03 ENCOUNTER — Encounter: Payer: Self-pay | Admitting: Registered"

## 2023-02-03 ENCOUNTER — Encounter: Payer: Medicaid Other | Attending: Pediatric Endocrinology | Admitting: Registered"

## 2023-02-03 DIAGNOSIS — F50819 Binge eating disorder, unspecified: Secondary | ICD-10-CM | POA: Insufficient documentation

## 2023-02-03 NOTE — Progress Notes (Signed)
Appointment start time: 9:12 Appointment end time: 9:50  Patient was seen on 02/03/2023 for nutrition counseling pertaining to disordered eating  Primary care provider: Mort Sawyers, FNP Therapist: Darlis Loan (sees once/month, in-person)  ROI: 08/26/2022 Any other medical team members: medication management Parents: mom Eunice Blase), sister [name])   Assessment  Pt arrives with mom. Pt states holidays went well. Mom states  Thanksgiving went well and then December came and pt was over it. Reports pt started getting down in December. States felt without Mounjaro she was feeling depressed, had increased food noise, and felt she was eating more frequently. Reports she started taking Zepbound last week. Reports she hasn't been motivated to go to the gym lately. Mom states she wants her to have good choices and wants pt to tell her what she wants to eat. States pt may need specific quantities for eating the right amounts at mealtimes. States she feels pt eats 2 big bowls of cereal for breakfast, pt denies this.  States pt has participated in weight loss programs with meal plans and it didn't work because pt wouldn't follow it.   Pt states she has resumed therapy; saw therapist yesterday. State she feels much better since meeting with them.   Previous appt: Following vegetarian diet. States things have been going well for her but will probably schedule an appt around Christmas because the holidays are usually hard for her. Mom reports patient has struggled with PCOS and effects of it over the years.    Eating history: Length of time:  Previous treatments: has seen RD before Goals for RD meetings:   Weight history:  Highest weight:    Lowest weight:  Most consistent weight:   What would you like to weigh: How has weight changed in the past year:   Medical Information:  Changes in hair, skin, nails since ED started:  Chewing/swallowing difficulties:  Reflux or heartburn:  Trouble with teeth:   LMP without the use of hormones:   Weight at that point:  Effect of exercise on menses:    Effect of hormones on menses:  Constipation, diarrhea:  Dizziness/lightheadedness:  Headaches/body aches:  Heart racing/chest pain:  Mood:  Sleep:  Focus/concentration:  Cold intolerance:  Vision changes:   Mental health diagnosis: BED   Dietary assessment: A typical day consists of 2 meals and 3 snacks  Safe foods include: Ramen noodles, cereal, [employer] chocolate milk with special K, soft serve ice cream from [employer], sandwich, water, bell peppers, raw broccoli, chocolate, cookies, candy, chips, bananas, apples, strawberries, kiwi, pineapple, cantaloupe, asparagus, green beans, lettuce, salads (sometimes), tomatoes, cucumbers, chicken, steak, bologna, [place], beef, bread, toast, granola, muffins, waffles/pancakes, potatoes, french fries, corn, baked beans, black beans, pinto beans, pasta, rice, mac and cheese, flip greek yogurt, cheese, Boba tea, shrimp  Avoided foods include: zucchini, onions, carrots, fish, seafood, red sauce, garlic, lemons, orange juice  24 hour recall:  B (7-8 am): 1 c cereal (yogurt and fruit cereal) + [employer] chocolate milk    S (1 pm):  L (5 pm): skipped or Cava-1/2 bowl (veggies, veggie meatballs, beans, rice)  S:   D (7-8 pm): 10 veggie nuggets + fries or skipped due to stomach hurting  S:   Beverages: water (5*40 oz; 200 oz), [employer] chocolate milk   What Methods Do You Use To Control Your Weight (Compensatory behaviors)?           None reported   Nutrition Diagnosis: NB-1.5 Disordered eating pattern As related to binge eating  disorder.  As evidenced by intake reflecting an imbalance of food groups.  Intervention/Goals: Pt and mom were reminded and discussed the purpose of having adequate meals/snacks and how to implement this into pt's regimen. Discussed benefits of having continued mental health support when in recovery for BED. Mom mentioned pt  setting alarms on her phone as meal reminders, I agree. Pt and mom  agreed with goals listed. Goals: - Continue to aim for at least 3 food groups with each meal.  - Aim for the 3 meals a day.  - Meals should be no longer than 5 hours apart. Set alarms on phone.   Meal plan:    3 meals    1-3 snacks  Monitoring and Evaluation: Patient will follow up in 3 weeks.

## 2023-02-03 NOTE — Patient Instructions (Signed)
-   Continue to aim for at least 3 food groups with each meal.   - Aim for the 3 meals a day.   - Meals should be no longer than 5 hours apart. Set alarms on phone.

## 2023-02-16 ENCOUNTER — Encounter: Payer: Self-pay | Admitting: Physician Assistant

## 2023-02-16 ENCOUNTER — Ambulatory Visit: Payer: Medicaid Other | Admitting: Clinical

## 2023-02-16 ENCOUNTER — Encounter: Payer: Self-pay | Admitting: Family

## 2023-02-16 DIAGNOSIS — E88819 Insulin resistance, unspecified: Secondary | ICD-10-CM

## 2023-02-16 DIAGNOSIS — F419 Anxiety disorder, unspecified: Secondary | ICD-10-CM

## 2023-02-16 DIAGNOSIS — Z6841 Body Mass Index (BMI) 40.0 and over, adult: Secondary | ICD-10-CM

## 2023-02-16 DIAGNOSIS — F840 Autistic disorder: Secondary | ICD-10-CM | POA: Diagnosis not present

## 2023-02-16 DIAGNOSIS — F32A Depression, unspecified: Secondary | ICD-10-CM | POA: Diagnosis not present

## 2023-02-16 NOTE — Progress Notes (Signed)
 [facility] Counselor/Therapist Progress Note  Patient ID: Sarajane Fambrough, MRN: [id number]    Date: 02/16/23  Time Spent: 10:59 am - 12:08 pm: 69 Minutes  Type of Service Provided Individual Therapy  Type of Contact in-person Location: office  Mental Status Exam: Appearance:  Casual and Fairly Groomed     Behavior: Appropriate and Sharing  Motor: Normal  Speech/Language:  Clear and Coherent and Normal Rate  Affect: Appropriate  Mood: normal  Thought process: normal  Thought content:   WNL  Sensory/Perceptual disturbances:   WNL  Orientation: oriented to person, place, time/date, and situation  Attention: Good  Concentration: Good  Memory: WNL  Fund of knowledge:  Fair  Insight:   Fair  Judgment:  Fair  Impulse Control: Fair   Risk Assessment:  Danger to Self:  No - denied SI Self-injurious Behavior: No - denied SIB Danger to Others: No Duty to Warn:no  Presenting Problems, Reported Symptoms, and /or Interim History: Shontia presented for a session to address anxiety and mood challenges.   Subjective: [place] presented for an individual outpatient therapy session. Her mother joined her for the session. Kambrey provided verbal consent for her mother to participate in the session. The following was addressed during sessions.   Brinkley rated her mood as a 5 out of 10 (with 1 being sad, 5 being neutral, and 10 being happy).  Adelie rated her anxiety as a 10 out of 10 and irritation/frustration as a 7 out of 10 (with 10 being high).   A situation with a former friend was discussed, with Terriah, the therapist, and her mother discussing options for moving forward. Looking for other resources to make friends was also discussed. Some challenges related to completion of home tasks and communication between [place] and her mother at that time was discussed, with a plan agreed to (mother will provide the need to be completed tasks and time frame and allow Sahej to complete it on her  own, Jaima will agree to let her mother know when it is complete and then take feedback from her mother at that time to complete any undone aspects). Milana reported very elevated anxiety due to school and a challenging class she is enrolled in. Strategies for managing this were discussed, with mother taking on a coach role also discussed.    Interventions/Psychotherapy Techniques Used During Session: Cognitive Behavioral Therapy, solution focused, executive functioning and life skills strategies   Diagnosis: Depression, unspecified depression type  Anxiety  Autism spectrum disorder  MENTAL HEALTH INTERVENTIONS USED DURING TREATMENT & PATIENT'S RESPONSE TO INTERVENTIONS:  Short-term Objective addressed today:Afomia will be able to identify strategies to help navigate stressors associated with the life transitions and implement them (today was time management and social connections) Mental health techniques used: Objective was addressed in session through the use of Cognitive Behavioral Therapy, solution oriented, life and executive functioning skills, and discussion. Kayani's response was positive. Progress Toward Goal: some progress  Short-term Objective addressed today:Yameli will be able to use appropriate coping strategies including helpful thoughts to maintain a low level of anxiety and depressive symptoms. Mental health techniques used: Objective was addressed in session through the use of Cognitive Behavioral Therapy and solution focused strategies and discussion. Bannie's response was positive. Progress Toward Goal: increase in symptoms related to school situation    PLAN  1. Corina will return for a therapy session.   2. Homework Given:  try new study and home task plan with mother, think about opportunities to potentially  expand her social connections. This homework will be reviewed with [name] at the next visit.  3. During the next session check in on mood and anxiety, and school - some  additional discussion of home tasks and conflict with sister especially will likely occur.     [name], [name]   Individual Treatment Plan  - please see the notes from 04/17/2021 & 05/01/2021 for the initial treatment plan, the updates in Winter 2024, and the goal review from 06/02/2022 for complete treatment plan information.    Problem/Need: Vidhi has a history of anxiety and mood concerns - with some ongoing challenges in these areas  Long-Term Goal #1: Lama will be able to identify and address any mood or anxiety challenges and will maintain a low level of anxiety and depression symptoms as evidenced by self-report. Short-Term Objectives: Objective 1A: Valree will be able to rate her mood and anxiety - GOAL MET  Objective 1B: Emmanuel will be able to identify any negative thinking patterns that are perpetuating anxiety or depressive symptoms and replace these with more adaptive thoughts. - GOAL Partially met  Objective 1C: Raneem will be able to use appropriate coping strategies including helpful thoughts to maintain a low level of anxiety and depressive symptoms. Interventions: Cognitive Behavioral Therapy, Psychologist, Occupational, Motivational Interviewing, and Psycho-education/Bibliotherapy  coping skills and other evidenced-based practices will be used to promote progress towards healthy functioning and to help manage decrease symptoms associated with their diagnosis.  Treatment Regimen: Individual skill building sessions every 2-4 weeks to address treatment goal/objective Target Date: 05/2023 Responsible Party: therapist and patient Person delivering treatment: Licensed Psychologist [name], [name] will support the patient's ability to achieve the goals identified. Resolved:  No - Adonai has made some progress, but mood and anxiety continue to be variable - goal continued   Problem/Need: Life transition - Jawana is in the process of completing a major life transition including graduating  from high school, starting college, and potentially working. - As of 06/02/2022 Lauriann has graduated from high school and successfully completed 2 semesters of college. However she continues to have life transitions, so the problem/need section has been updated  Updated Problem/Need: Life transition - Cecilia has continuing life transitions including working towards completing her college program and getting a job.  Long-Term Goal #2: Briar will successfully navigate these life transitions including being able to manage her stress level. Short-Term Objectives: Objective 2A: Harris will be able to identify stressors associated with the life transitions - GOAL MET  - stressors include time management, test taking, and expanding her social circle  Objective 2B: Georgetta will be able to identify strategies to help navigate stressors associated with the life transitions and implement them  Interventions: Assertiveness/Communication, Psychologist, Occupational, and Psycho-education/Bibliotherapy , and other evidenced-based practices will be used to promote progress towards healthy functioning and to help manage decrease symptoms associated with their diagnosis.  Treatment Regimen: Individual skill building sessions every 2-4 weeks to address treatment goal/objective Target Date: 05/2023 Responsible Party: therapist and patient Person delivering treatment: Licensed Psychologist [name], [name] will support the patient's ability to achieve the goals identified. Resolved:  No  Problem/Need: Life skills - Daysi needs to develop some additional skills to facilitate independent living. Long-Term Goal #3: Ryeleigh will identify the additional life skills necessary to facilitate independent living. Short-Term Objectives: Objective 3A: Riot will be able to identify areas that she needs to address to increase the likelihood of her being able to successfully live independently.  -  GOAL MET  - areas identified so far (as of  06/02/2022) include money management, cooking, and transportation   Objective 3B: Ayannah will be able to create a plan to address weaknesses described in objective 3A.   Objective 3C: Samya will be able to implement steps on the plan developed in objective 3B. Interventions: Motivational Interviewing, Solution-Oriented/Positive Psychology, and Psycho-education/Bibliotherapy  daily life skills, and other evidenced-based practices will be used to promote progress towards healthy functioning and to help manage decrease symptoms associated with their diagnosis.  Treatment Regimen: Individual skill building sessions every 2-4 weeks to address treatment goal/objective Target Date: 05/2023 Responsible Party: therapist and patient Person delivering treatment: Licensed Psychologist [name], [name] will support the patient's ability to achieve the goals identified. Resolved:  No - Tsion has made some strides towards identifying areas that need to be addressed to facilitate independent living and made some progress on developing those skills, but process continues   [name], [name]

## 2023-02-17 MED ORDER — ZEPBOUND 7.5 MG/0.5ML ~~LOC~~ SOAJ: 7.5000 mg | SUBCUTANEOUS | 0 refills | Status: DC

## 2023-02-19 ENCOUNTER — Telehealth: Payer: Self-pay | Admitting: Hematology and Oncology

## 2023-02-22 ENCOUNTER — Other Ambulatory Visit: Payer: Self-pay

## 2023-02-22 DIAGNOSIS — E611 Iron deficiency: Secondary | ICD-10-CM

## 2023-02-22 DIAGNOSIS — R790 Abnormal level of blood mineral: Secondary | ICD-10-CM

## 2023-02-23 ENCOUNTER — Ambulatory Visit: Payer: Medicaid Other | Admitting: Clinical

## 2023-02-23 ENCOUNTER — Inpatient Hospital Stay: Payer: Medicaid Other | Attending: Hematology and Oncology

## 2023-02-23 ENCOUNTER — Inpatient Hospital Stay (HOSPITAL_BASED_OUTPATIENT_CLINIC_OR_DEPARTMENT_OTHER): Payer: Medicaid Other | Admitting: Hematology and Oncology

## 2023-02-23 VITALS — BP 109/52 | HR 89 | Temp 98.1°F | Resp 16 | Wt 328.1 lb

## 2023-02-23 DIAGNOSIS — E611 Iron deficiency: Secondary | ICD-10-CM | POA: Insufficient documentation

## 2023-02-23 DIAGNOSIS — R109 Unspecified abdominal pain: Secondary | ICD-10-CM | POA: Diagnosis not present

## 2023-02-23 DIAGNOSIS — Z803 Family history of malignant neoplasm of breast: Secondary | ICD-10-CM | POA: Insufficient documentation

## 2023-02-23 DIAGNOSIS — F419 Anxiety disorder, unspecified: Secondary | ICD-10-CM | POA: Diagnosis not present

## 2023-02-23 DIAGNOSIS — F32A Depression, unspecified: Secondary | ICD-10-CM

## 2023-02-23 DIAGNOSIS — F840 Autistic disorder: Secondary | ICD-10-CM

## 2023-02-23 DIAGNOSIS — F50819 Binge eating disorder, unspecified: Secondary | ICD-10-CM | POA: Insufficient documentation

## 2023-02-23 DIAGNOSIS — R5382 Chronic fatigue, unspecified: Secondary | ICD-10-CM

## 2023-02-23 DIAGNOSIS — E282 Polycystic ovarian syndrome: Secondary | ICD-10-CM | POA: Diagnosis not present

## 2023-02-23 DIAGNOSIS — R5383 Other fatigue: Secondary | ICD-10-CM

## 2023-02-23 LAB — RETIC PANEL
Immature Retic Fract: 4.1 % (ref 2.3–15.9)
RBC.: 4.93 MIL/uL (ref 3.87–5.11)
Retic Count, Absolute: 69.5 10*3/uL (ref 19.0–186.0)
Retic Ct Pct: 1.4 % (ref 0.4–3.1)
Reticulocyte Hemoglobin: 31.5 pg (ref >27.9)

## 2023-02-23 LAB — CBC WITH DIFFERENTIAL (CANCER CENTER ONLY)
Abs Immature Granulocytes: 0.01 10*3/uL (ref 0.00–0.07)
Basophils Absolute: 0.1 10*3/uL (ref 0.0–0.1)
Basophils Relative: 1 %
Eosinophils Absolute: 0.1 10*3/uL (ref 0.0–0.5)
Eosinophils Relative: 1 %
HCT: 40.6 % (ref 36.0–46.0)
Hemoglobin: 14.2 g/dL (ref 12.0–15.0)
Immature Granulocytes: 0 %
Lymphocytes Relative: 26 %
Lymphs Abs: 1.8 10*3/uL (ref 0.7–4.0)
MCH: 28.3 pg (ref 26.0–34.0)
MCHC: 35 g/dL (ref 30.0–36.0)
MCV: 81 fL (ref 80.0–100.0)
Monocytes Absolute: 0.4 10*3/uL (ref 0.1–1.0)
Monocytes Relative: 6 %
Neutro Abs: 4.5 10*3/uL (ref 1.7–7.7)
Neutrophils Relative %: 66 %
Platelet Count: 278 10*3/uL (ref 150–400)
RBC: 5.01 MIL/uL (ref 3.87–5.11)
RDW: 11.9 % (ref 11.5–15.5)
WBC Count: 6.8 10*3/uL (ref 4.0–10.5)
nRBC: 0 % (ref 0.0–0.2)

## 2023-02-23 LAB — CMP (CANCER CENTER ONLY)
ALT: 17 U/L (ref 0–44)
AST: 12 U/L — ABNORMAL LOW (ref 15–41)
Albumin: 4 g/dL (ref 3.5–5.0)
Alkaline Phosphatase: 104 U/L (ref 38–126)
Anion gap: 7 (ref 5–15)
BUN: 8 mg/dL (ref 6–20)
CO2: 26 mmol/L (ref 22–32)
Calcium: 9.8 mg/dL (ref 8.9–10.3)
Chloride: 106 mmol/L (ref 98–111)
Creatinine: 0.68 mg/dL (ref 0.44–1.00)
GFR, Estimated: >60 mL/min (ref >60)
Glucose, Bld: 100 mg/dL — ABNORMAL HIGH (ref 70–99)
Potassium: 3.6 mmol/L (ref 3.5–5.1)
Sodium: 139 mmol/L (ref 135–145)
Total Bilirubin: 0.3 mg/dL (ref 0.0–1.2)
Total Protein: 7.2 g/dL (ref 6.5–8.1)

## 2023-02-23 LAB — IRON AND IRON BINDING CAPACITY (CC-WL,HP ONLY)
Iron: 61 ug/dL (ref 28–170)
Saturation Ratios: 18 % (ref 10.4–31.8)
TIBC: 343 ug/dL (ref 250–450)
UIBC: 282 ug/dL (ref 148–442)

## 2023-02-23 LAB — FERRITIN: Ferritin: 350 ng/mL — ABNORMAL HIGH (ref 11–307)

## 2023-02-23 NOTE — Progress Notes (Signed)
[facility] Telephone:([phone]) [phone]   [phone]  PROGRESS NOTE  Patient Care Team: Mort Sawyers, FNP as PCP - General (Family Medicine)  CHIEF COMPLAINTS/PURPOSE OF CONSULTATION:  "Iron deficiency "  CURRENT THERAPY: Ferrous sulfate 325 mg once daily IV Feraheme as needed. Received 3 doses. Last dose on 10/07/2020.   HISTORY OF PRESENTING ILLNESS:  Sarah Shah 23 y.o. female who returns for a follow up for iron deficiency. She is accompanied by her mother for this visit.   On exam today, Sarah Shah reports she has been well overall interim since her last visit.  She reports her fatigue has improved since she became a vegetarian.  She reports that she is enjoying college and studying animal care.  She reports that she would like to take care of exotic animals like parakeets, [place] pigs, and snakes.  She reports that she became vegetarian on her 22st birthday.  She notes her energy levels vary and that she is "very tired right now".  She notes that she feels quite restless and has difficulty with sleeping.  She reports that she has not had a menstrual cycle in 2 years due to her PCOS.  She denies any overt signs of bleeding such as blood in the urine, stool, or dark stools.  She reports that she feels exhausted and tired even after 8 hours of sleep.  She does wake up sometimes at night though she does not have any headaches and nobody reports that she snores.  She notes that she does have some occasional lightheaded and dizziness but no shortness of breath.  She reports that she has been "slacking" when comes to taking her iron pills but does take them on occasion.  She denies any fevers, chills, night sweats, shortness of breath, chest pain or cough.  She has no other complaints.  Rest the 10 point ROS is below  MEDICAL HISTORY:  Past Medical History:  Diagnosis Date   ADHD (attention deficit hyperactivity disorder)    Anemia    Anxiety    Autism spectrum     Constipated    Constipation    Depression    Development delay    GERD (gastroesophageal reflux disease)    Headache    Insulin resistance    Iron deficiency    Learning disability    Obesity    ODD (oppositional defiant disorder)    PCOS (polycystic ovarian syndrome)    PMDD (premenstrual dysphoric disorder)    Pneumonia    3 mos old and 38 mos old   Visual acuity reduced    glasses    SURGICAL HISTORY: Past Surgical History:  Procedure Laterality Date   BIOPSY  02/17/2021   Procedure: BIOPSY;  Surgeon: Hilarie Fredrickson, MD;  Location: [facility];  Service: Endoscopy;;   CHOLECYSTECTOMY  09/17/2021   COLONOSCOPY WITH PROPOFOL N/A 02/17/2021   Procedure: COLONOSCOPY WITH PROPOFOL;  Surgeon: Hilarie Fredrickson, MD;  Location: [facility];  Service: Endoscopy;  Laterality: N/A;   ESOPHAGOGASTRODUODENOSCOPY (EGD) WITH PROPOFOL N/A 02/17/2021   Procedure: ESOPHAGOGASTRODUODENOSCOPY (EGD) WITH PROPOFOL;  Surgeon: Hilarie Fredrickson, MD;  Location: [facility];  Service: Endoscopy;  Laterality: N/A;   TOOTH EXTRACTION N/A 01/03/2018   Procedure: SURGICAL EXTRACTION OF TEETH #1, 16, 17, 32;  Surgeon: Vivia Ewing, DMD;  Location: [facility] OR;  Service: Oral Surgery;  Laterality: N/A;    SOCIAL HISTORY: Social History   Socioeconomic History   Marital status: Single    Spouse name:  Not on file   Number of children: 0   Years of education: Not on file   Highest education level: Some college, no degree  Occupational History   Occupation: student  Tobacco Use   Smoking status: Never   Smokeless tobacco: Never  Vaping Use   Vaping status: Never Used  Substance and Sexual Activity   Alcohol use: No   Drug use: Never   Sexual activity: Never    Birth control/protection: Pill  Other Topics Concern   Not on file  Social History Narrative   Will start 12th grade at [employer] for the 22/23 school year.       07/20/19   Enjoys: sleep, paints, sewing (sock dolls), animal care    From: born in [place] but moved her when she was little   Who is at home: with mom Stanton Kidney, stepdad Thayer Ohm, and sister [place]   Pets: loves her [place] pigs, Careers information officer, ducks, dogs, classroom bunny   School: [employer]    Grade: 11th grade this fall      Family: good relationship with mom and family       Exercise: walking with mom    Diet: avoiding reflux worsening food      Safety   Seat belts: Yes    Guns: Yes  and secure   Safe in relationships: Yes    Helmets: Yes    Smoke Exposure at home: No   Bullying: Yes  and knows who she can ask for help and feels   Social Drivers of Health   Financial Resource Strain: Low Risk  (01/20/2023)   Overall Financial Resource Strain (CARDIA)    Difficulty of Paying Living Expenses: Not hard at all  Food Insecurity: No Food Insecurity (01/20/2023)   Hunger Vital Sign    Worried About Running Out of Food in the Last Year: Never true    Ran Out of Food in the Last Year: Never true  Transportation Needs: No Transportation Needs (01/20/2023)   PRAPARE - Administrator, Civil Service (Medical): No    Lack of Transportation (Non-Medical): No  Physical Activity: Insufficiently Active (01/20/2023)   Exercise Vital Sign    Days of Exercise per Week: 2 days    Minutes of Exercise per Session: 10 min  Stress: No Stress Concern Present (01/20/2023)   [employer] of Occupational Health - Occupational Stress Questionnaire    Feeling of Stress : Only a little  Social Connections: Moderately Isolated (01/20/2023)   Social Connection and Isolation Panel [NHANES]    Frequency of Communication with Friends and Family: More than three times a week    Frequency of Social Gatherings with Friends and Family: Twice a week    Attends Religious Services: 1 to 4 times per year    Active Member of [employer] or Organizations: No    Attends Engineer, structural: Not on file    Marital Status: Never married  Catering manager Violence: Not on file     FAMILY HISTORY: Family History  Problem Relation Age of Onset   Diabetes Mother    Hypertension Mother    Anxiety disorder Mother    Other Mother        Premenstrual dysphoic disorder   Obesity Mother    Stroke Mother    Colon polyps Mother    Irritable bowel syndrome Mother    Diabetes Father        type 1   Depression Father  Anxiety disorder Father    Obesity Father    Anxiety disorder Sister    ADD / ADHD Sister        ADD   Other Sister        Premenstrual dysphoric disorder   Atrial fibrillation Maternal Grandmother    Diabetes Maternal Grandmother    Heart failure Maternal Grandmother    Hypertension Maternal Grandfather    Iron deficiency Maternal Aunt    Breast cancer Maternal Aunt     ALLERGIES:  is allergic to wound dressing adhesive and semaglutide.  MEDICATIONS:  Current Outpatient Medications  Medication Sig Dispense Refill   Amphetamine Sulfate 10 MG TABS Take 1 tablet by mouth daily as needed.     amphetamine-dextroamphetamine (ADDERALL XR) 30 MG 24 hr capsule Take 1 capsule (30 mg total) by mouth in the morning. 30 capsule 0   busPIRone (BUSPAR) 30 MG tablet Take 30 mg by mouth 2 (two) times daily.     cloNIDine (CATAPRES) 0.2 MG tablet Take 0.2 mg by mouth 2 (two) times daily.     desogestrel-ethinyl estradiol (VOLNEA) 0.15-0.02/0.01 MG (21/5) tablet TAKE 1 TABLET BY MOUTH EVERY DAY. TAKE ACTIVE TABLETS ONLY, SKIP PLACEBO TABLETS. 90 tablet 0   docusate sodium (COLACE) 100 MG capsule Take 100 mg by mouth at bedtime.     famotidine (PEPCID) 20 MG tablet Take 20 mg by mouth daily.     ferrous sulfate 325 (65 FE) MG tablet Take 325 mg by mouth at bedtime.     JORNAY PM 60 MG CP24 Take 1 capsule by mouth at bedtime.     omeprazole (PRILOSEC) 40 MG capsule Take 1 capsule (40 mg total) by mouth daily. 90 capsule 4   tirzepatide (ZEPBOUND) 7.5 MG/0.5ML Pen Inject 7.5 mg into the skin once a week. 6 mL 0   venlafaxine XR (EFFEXOR-XR) 150 MG 24 hr  capsule Take 150 mg by mouth daily.     venlafaxine XR (EFFEXOR-XR) 75 MG 24 hr capsule Take 75 mg by mouth daily.     vitamin C (ASCORBIC ACID) 500 MG tablet Take 500 mg by mouth at bedtime.     No current facility-administered medications for this visit.    REVIEW OF SYSTEMS:   Constitutional: ( - ) fevers, ( - )  chills , ( - ) night sweats Eyes: ( - ) blurriness of vision, ( - ) double vision, ( - ) watery eyes Ears, nose, mouth, throat, and face: ( - ) mucositis, ( - ) sore throat Respiratory: ( - ) cough, ( - ) dyspnea, ( - ) wheezes Cardiovascular: ( - ) palpitation, ( - ) chest discomfort, ( - ) lower extremity swelling Gastrointestinal:  ( - ) nausea, ( - ) heartburn, ( - ) change in bowel habits Skin: ( - ) abnormal skin rashes Lymphatics: ( - ) new lymphadenopathy, ( - ) easy bruising Neurological: ( - ) numbness, ( - ) tingling, ( - ) new weaknesses Behavioral/Psych: ( - ) mood change, ( - ) new changes  All other systems were reviewed with the patient and are negative.  PHYSICAL EXAMINATION: Not performed due to virtual visit   LABORATORY DATA:  I have reviewed the data as listed    Latest Ref Rng & Units 02/23/2023    8:59 AM 07/15/2022    2:05 PM 05/28/2022    9:06 AM  CBC  WBC 4.0 - 10.5 K/uL 6.8  7.7  8.3   Hemoglobin 12.0 -  15.0 g/dL 40.9  81.1  91.4   Hematocrit 36.0 - 46.0 % 40.6  42.3  40.5   Platelets 150 - 400 K/uL 278  321  296.0        Latest Ref Rng & Units 02/23/2023    8:59 AM 05/28/2022    9:06 AM 04/10/2022    5:16 PM  CMP  Glucose 70 - 99 mg/dL 782  94  81   BUN 6 - 20 mg/dL 8  15  15    Creatinine 0.44 - 1.00 mg/dL 9.56  2.13  0.86   Sodium 135 - 145 mmol/L 139  139  139   Potassium 3.5 - 5.1 mmol/L 3.6  3.9  3.9   Chloride 98 - 111 mmol/L 106  105  104   CO2 22 - 32 mmol/L 26  24  22    Calcium 8.9 - 10.3 mg/dL 9.8  9.4  57.8   Total Protein 6.5 - 8.1 g/dL 7.2  6.5  7.9   Total Bilirubin 0.0 - 1.2 mg/dL 0.3  0.2  0.4   Alkaline Phos 38 -  126 U/L 104  81  102   AST 15 - 41 U/L 12  12  19    ALT 0 - 44 U/L 17  12  20       ASSESSMENT & PLAN Sarah Shah is a 23 y.o. female who presents to the clinic for iron deficiency without anemia.  #Iron deficiency without anemia: --Received IV feraheme x 3 doses, most recent infusion given on 10/07/2020.  --Labs today show WBC 6.8, Hgb 14.2, MCV 81.0, Plt 278 --Currently on ferrous sulfate 325 mg once daily. Advised to continue but can take once every other day due to persistent constipation.  -- Patient has marked fatigue despite adequate ferritin levels.  Recommend holding on further IV iron infusions.  Okay to continue iron pills at this time.  #Fatigue -- Lab work has been remarkably normal, suspect possible OSA. -- Both patient's parents have OSA and her symptoms do appear consistent -- Will make referral for sleep study.  #Abdominal pain: --CT scan from 12/10/2020 shows mesenteric lymph nodes within the mid to lower right abdomen concerning for mesenteric adenitis. Rest of the scan was unremarkable.  --Underwent EGD/colonoscopy on 02/17/2021, normal. Labs did not indicate celiac disease.  --Under the care of [facility] team.   Follow up: --RTC in 6 months with labs  Orders Placed This Encounter  Procedures   Ambulatory referral to Neurology    Referral Priority:   Routine    Referral Type:   Consultation    Referral Reason:   Specialty Services Required    Referred to Provider:   Melvyn Novas, MD    Requested Specialty:   Neurology    Number of Visits Requested:   1     All questions were answered. The patient knows to call the clinic with any problems, questions or concerns.  I have spent a total of 30 minutes minutes of non-face-to-face time, preparing to see the patient, performing a medically appropriate examination, counseling and educating the patient, referring the patient to another medical provider, documenting clinical information in the electronic health  record, and care coordination.   Ulysees Barns, MD Department of Hematology/Oncology [facility] Phone: [phone] Pager: [phone] Email: [email or web address]

## 2023-02-23 NOTE — Progress Notes (Signed)
[facility] Counselor/Therapist Progress Note  Patient ID: Sarah Shah, MRN: [id number]    Date: 02/23/23  Time Spent: 10:09 am - 10:56 am: 47 Minutes  Type of Service Provided Individual Therapy  Type of Contact in-person Location: office  Mental Status Exam: Appearance:  Casual and Fairly Groomed     Behavior: Appropriate but a bit drowsy  Motor: Normal  Speech/Language:  Clear and Coherent and Normal Rate  Affect: Appropriate  Mood: normal  Thought process: normal  Thought content:   WNL  Sensory/Perceptual disturbances:   WNL  Orientation: oriented to person, place, time/date, and situation  Attention: Fair  Concentration: Fair  Memory: WNL  Fund of knowledge:  Fair  Insight:   Fair  Judgment:  Fair  Impulse Control: Good   Risk Assessment:  Danger to Self:  No - denied SI Self-injurious Behavior: No - denied SIB Danger to Others: No Duty to Warn:no  Presenting Problems, Reported Symptoms, and /or Interim History: Sarah Shah presented for a session to address life stress and anxiety and mood problems.   Subjective: Sarah Shah presented for an individual outpatient therapy session. The following was addressed during sessions.   Sarah Shah rated her mood as a 5 out of 10 (with 1 being sad, 5 being neutral, and 10 being happy).  Sarah Shah rated her anxiety as a 5 out of 10 and irritation/frustration as a 6 out of 10 (with 10 being high). During the visit today, Sarah Shah reported an increase in fatigue despite feeling that she is getting adequate sleep. She is currently working on this with her medical doctors. She has not talked to her old friend and has decided not to for now. Her mother has expressed some anxiety about Sarah Shah's grades, though Sarah Shah reported that he grades were mostly Bs. She reported engaging in a few positive behaviors to help manage he mood and anxiety, such as going for a walk. However, Sarah Shah reported that despite feeling like she trying hard and she  continues to feel anxiety about her school performance. She also has struggled to complete her school work and engage in activities that she enjoys. Therapist and [name] talked again about Cam's tendency to engage in behaviors that she thinks will help her feel better (e.g., putting off her school work) but it actually results in more negative emotions. Sarah Shah reported that she has struggled to develop a schedule that will help her to both complete her school work and have time to complete activities that she likes. Therefore, Sarah Shah and therapist developed a schedule in session, which was printed out for Sarah Shah.     Interventions/Psychotherapy Techniques Used During Session: Cognitive Behavioral Therapy, executive functioning supports, life skills  Diagnosis: Depression, unspecified depression type  Anxiety  Autism spectrum disorder  MENTAL HEALTH INTERVENTIONS USED DURING TREATMENT & PATIENT'S RESPONSE TO INTERVENTIONS:  Short-term Objective addressed today:Sarah Shah will be able to use appropriate coping strategies including helpful thoughts to maintain a low level of anxiety and depressive symptoms. Mental health techniques used: Objective was addressed in session through the use of Cognitive Behavioral Therapy and discussion. Sarah Shah's response was generally positive. Progress Toward Goal: some progress  Short-term Objective addressed today:Sarah Shah will be able to identify strategies to help navigate stressors associated with the life transitions and implement them (today was school work completion) Mental health techniques used: Objective was addressed in session through the use of executive functioning supports, life skills, and discussion. Sarah Shah's response was positive. Progress Toward Goal: some progress    PLAN  1. Sarah Shah will return for a therapy session.   2. Homework Given:  Sarah Shah will tryout the proposed schedule a few times and return with feedback about it, continue to engage in stress  management strategies. This homework will be reviewed with Sarah Shah at the next visit.  3. During the next session check in on mood, anxiety, school work, schedule.     Sarah Derby, [name]  Individual Treatment Plan  - please see the notes from 04/17/2021 & 05/01/2021 for the initial treatment plan, the updates in Winter 2024, and the goal review from 06/02/2022 for complete treatment plan information.    Problem/Need: Sarah Shah has a history of anxiety and mood concerns - with some ongoing challenges in these areas  Long-Term Goal #1: Sarah Shah will be able to identify and address any mood or anxiety challenges and will maintain a low level of anxiety and depression symptoms as evidenced by self-report. Short-Term Objectives: Objective 1A: Sarah Shah will be able to rate her mood and anxiety - GOAL MET  Objective 1B: Sarah Shah will be able to identify any negative thinking patterns that are perpetuating anxiety or depressive symptoms and replace these with more adaptive thoughts. - GOAL Partially met  Objective 1C: Sarah Shah will be able to use appropriate coping strategies including helpful thoughts to maintain a low level of anxiety and depressive symptoms. Interventions: Cognitive Behavioral Therapy, Psychologist, occupational, Motivational Interviewing, and Psycho-education/Bibliotherapy  coping skills and other evidenced-based practices will be used to promote progress towards healthy functioning and to help manage decrease symptoms associated with their diagnosis.  Treatment Regimen: Individual skill building sessions every 2-4 weeks to address treatment goal/objective Target Date: 05/2023 Responsible Party: therapist and patient Person delivering treatment: Licensed Psychologist Sarah Derby, [name] will support the patient's ability to achieve the goals identified. Resolved:  No - Sarah Shah has made some progress, but mood and anxiety continue to be variable - goal continued   Problem/Need: Life transition - Sarah Shah is in the  process of completing a major life transition including graduating from high school, starting college, and potentially working. - As of 06/02/2022 Sarah Shah has graduated from high school and successfully completed 2 semesters of college. However she continues to have life transitions, so the problem/need section has been updated  Updated Problem/Need: Life transition - Anida has continuing life transitions including working towards completing her college program and getting a job.  Long-Term Goal #2: Doneta will successfully navigate these life transitions including being able to manage her stress level. Short-Term Objectives: Objective 2A: Huntley will be able to identify stressors associated with the life transitions - GOAL MET  - stressors include time management, test taking, and expanding her social circle  Objective 2B: Khrystina will be able to identify strategies to help navigate stressors associated with the life transitions and implement them  Interventions: Assertiveness/Communication, Psychologist, occupational, and Psycho-education/Bibliotherapy , and other evidenced-based practices will be used to promote progress towards healthy functioning and to help manage decrease symptoms associated with their diagnosis.  Treatment Regimen: Individual skill building sessions every 2-4 weeks to address treatment goal/objective Target Date: 05/2023 Responsible Party: therapist and patient Person delivering treatment: Licensed Psychologist Sarah Derby, [name] will support the patient's ability to achieve the goals identified. Resolved:  No  Problem/Need: Life skills - Sinthia needs to develop some additional skills to facilitate independent living. Long-Term Goal #3: Shatima will identify the additional life skills necessary to facilitate independent living. Short-Term Objectives: Objective 3A: Alin will be able to identify areas that she  needs to address to increase the likelihood of her being able to successfully  live independently.  - GOAL MET  - areas identified so far (as of 06/02/2022) include money management, cooking, and transportation   Objective 3B: Sharion will be able to create a plan to address weaknesses described in objective 3A.   Objective 3C: Natalyah will be able to implement steps on the plan developed in objective 3B. Interventions: Motivational Interviewing, Solution-Oriented/Positive Psychology, and Psycho-education/Bibliotherapy  daily life skills, and other evidenced-based practices will be used to promote progress towards healthy functioning and to help manage decrease symptoms associated with their diagnosis.  Treatment Regimen: Individual skill building sessions every 2-4 weeks to address treatment goal/objective Target Date: 05/2023 Responsible Party: therapist and patient Person delivering treatment: Licensed Psychologist Sarah Derby, [name] will support the patient's ability to achieve the goals identified. Resolved:  No - Jalin has made some strides towards identifying areas that need to be addressed to facilitate independent living and made some progress on developing those skills, but process continues   Sarah Derby, [name]

## 2023-02-24 ENCOUNTER — Telehealth: Payer: Self-pay | Admitting: *Deleted

## 2023-02-24 ENCOUNTER — Encounter: Payer: Self-pay | Admitting: Registered"

## 2023-02-24 ENCOUNTER — Ambulatory Visit: Payer: Self-pay | Admitting: Registered"

## 2023-02-24 DIAGNOSIS — R5383 Other fatigue: Secondary | ICD-10-CM | POA: Diagnosis not present

## 2023-02-24 DIAGNOSIS — Z803 Family history of malignant neoplasm of breast: Secondary | ICD-10-CM | POA: Diagnosis not present

## 2023-02-24 DIAGNOSIS — E282 Polycystic ovarian syndrome: Secondary | ICD-10-CM | POA: Diagnosis not present

## 2023-02-24 DIAGNOSIS — R109 Unspecified abdominal pain: Secondary | ICD-10-CM | POA: Diagnosis not present

## 2023-02-24 DIAGNOSIS — E611 Iron deficiency: Secondary | ICD-10-CM | POA: Diagnosis not present

## 2023-02-24 DIAGNOSIS — F50819 Binge eating disorder, unspecified: Secondary | ICD-10-CM | POA: Diagnosis not present

## 2023-02-24 NOTE — Telephone Encounter (Signed)
TCT patient regarding recent lab results.  Spoke with her mother. Advised that her iron studies are normal, HGB is great.  Dr. Leonides Schanz sent a sleep study referral. Her mother voiced understanding. She states that pt does not snore at night and she also had some of her mental health medications changed. She is wondering if those changes are making her fatigued. Advised that she should discuss those medications with the provider who orders them. Reinforced that from an iron/HGB perspecetive, pt labs are normal.

## 2023-02-24 NOTE — Progress Notes (Signed)
This visit was completed virtually. I spoke with Sarah Shah and verified that I was speaking with the correct person with two patient identifiers (full name and date of birth). I discussed the limitations related to this kind of visit and the patient is willing to proceed.   Appointment start time: 9:03 Appointment end time: 9:33  Patient was seen on 02/24/2023 for nutrition counseling pertaining to disordered eating  Primary care provider: Mort Sawyers, FNP Therapist: Darlis Loan (sees once/week, in-person)  ROI: 08/26/2022 Any other medical team members: medication management Parents: mom (Sarah Shah), sister Sarah Shah)   Assessment  States she went to hematology yesterday to have iron levels checked due to feeling tired more often. Fasting labs are within normal limits except elevated Ferr (350 ng/mL) and elevated Glu (100 mg/dL). Reports she is also looking into getting scheduled for sleep study. States she is taking a higher dose of Zepbound; tolerating well. Reports therapy is going well. States she was exhausted last night and went to bed early; woke up around 8 am.   Pt states she has been working on previous goals. Mom states she has been eating her protein.   Previous appt: Following vegetarian diet. States things have been going well for her but will probably schedule an appt around Christmas because the holidays are usually hard for her. Mom reports patient has struggled with PCOS and effects of it over the years.    Eating history: Length of time:  Previous treatments: has seen RD before Goals for RD meetings:   Weight history:  Highest weight:    Lowest weight:  Most consistent weight:   What would you like to weigh: How has weight changed in the past year:   Medical Information:  Changes in hair, skin, nails since ED started:  Chewing/swallowing difficulties:  Reflux or heartburn:  Trouble with teeth:  LMP without the use of hormones:   Weight at that point:  Effect  of exercise on menses:    Effect of hormones on menses:  Constipation, diarrhea:  Dizziness/lightheadedness:  Headaches/body aches:  Heart racing/chest pain:  Mood:  Sleep:  Focus/concentration:  Cold intolerance:  Vision changes:   Mental health diagnosis: BED   Dietary assessment: A typical day consists of 2 meals and 3 snacks  Safe foods include: Ramen noodles, cereal, [employer] chocolate milk with special K, soft serve ice cream from [employer], sandwich, water, bell peppers, raw broccoli, chocolate, cookies, candy, chips, bananas, apples, strawberries, kiwi, pineapple, cantaloupe, asparagus, green beans, lettuce, salads (sometimes), tomatoes, cucumbers, chicken, steak, bologna, [place], beef, bread, toast, granola, muffins, waffles/pancakes, potatoes, french fries, corn, baked beans, black beans, pinto beans, pasta, rice, mac and cheese, flip greek yogurt, cheese, Boba tea, shrimp  Avoided foods include: zucchini, onions, carrots, fish, seafood, red sauce, garlic, lemons, orange juice  24 hour recall:  B (7-8 am): banana or 1 c cereal (yogurt and fruit cereal) + [employer] chocolate milk    S (1 pm):  L (5 pm): 1 pepper stuffed with rice and kidney beans or skipped or Cava-1/2 bowl (veggies, veggie meatballs, beans, rice)  S: ice cream cake   D (7-8 pm): 1 c kidney beans + 1 c rice or 10 veggie nuggets + fries or skipped due to stomach hurting  S:   Beverages: water (5*40 oz; 200 oz)   What Methods Do You Use To Control Your Weight (Compensatory behaviors)?           None reported   Nutrition Diagnosis: NB-1.5 Disordered  eating pattern As related to binge eating disorder.  As evidenced by intake reflecting an imbalance of food groups.  Intervention/Goals: Pt was reminded and discussed the purpose of having adequate meals/snacks and how to implement this into pt's regimen. Encouraged with changes made from previous visit. Pt agreed with goals listed. Goals: - Continue to aim  for at least 3 food groups with each meal.  - Great job having 3 meals a day and drinking adequate water! - Follow up with scheduling sleep study appointment.   Meal plan:    3 meals    1-3 snacks  Monitoring and Evaluation: Patient will follow up in 3 weeks.

## 2023-02-24 NOTE — Telephone Encounter (Signed)
-----   Message from Ulysees Barns IV sent at 02/24/2023  8:40 AM EST ----- Please let Sarah Shah know that her iron levels appear normal. I do suspect her fatigue is more related to OSA. We have placed the referral to sleep medicine. ----- Message ----- From: Leory Plowman, [facility] [name] Sent: 02/23/2023   9:17 AM EST To: Jaci Standard, MD

## 2023-02-24 NOTE — Patient Instructions (Addendum)
-   Continue to aim for at least 3 food groups with each meal.   - Great job having 3 meals a day and drinking adequate water!  - Follow up with scheduling sleep study appointment.

## 2023-03-02 ENCOUNTER — Encounter: Payer: Self-pay | Admitting: Physician Assistant

## 2023-03-02 ENCOUNTER — Other Ambulatory Visit (HOSPITAL_COMMUNITY): Payer: Self-pay

## 2023-03-02 DIAGNOSIS — F913 Oppositional defiant disorder: Secondary | ICD-10-CM | POA: Diagnosis not present

## 2023-03-02 DIAGNOSIS — F900 Attention-deficit hyperactivity disorder, predominantly inattentive type: Secondary | ICD-10-CM | POA: Diagnosis not present

## 2023-03-02 DIAGNOSIS — F341 Dysthymic disorder: Secondary | ICD-10-CM | POA: Diagnosis not present

## 2023-03-02 DIAGNOSIS — F411 Generalized anxiety disorder: Secondary | ICD-10-CM | POA: Diagnosis not present

## 2023-03-02 MED ORDER — AMPHETAMINE-DEXTROAMPHET ER 30 MG PO CP24
30.0000 mg | ORAL_CAPSULE | Freq: Every morning | ORAL | 0 refills | Status: DC
  Filled 2023-03-02: qty 30, 30d supply, fill #0

## 2023-03-03 ENCOUNTER — Other Ambulatory Visit (HOSPITAL_COMMUNITY): Payer: Self-pay

## 2023-03-04 ENCOUNTER — Encounter: Payer: Self-pay | Admitting: Physician Assistant

## 2023-03-16 ENCOUNTER — Ambulatory Visit: Payer: Medicaid Other | Admitting: Clinical

## 2023-03-16 DIAGNOSIS — F419 Anxiety disorder, unspecified: Secondary | ICD-10-CM | POA: Diagnosis not present

## 2023-03-16 DIAGNOSIS — F32A Depression, unspecified: Secondary | ICD-10-CM

## 2023-03-16 DIAGNOSIS — F840 Autistic disorder: Secondary | ICD-10-CM

## 2023-03-16 NOTE — Progress Notes (Signed)
 [facility] Counselor/Therapist Progress Note  Patient ID: Sarah Shah, MRN: [id number]    Date: 03/16/23  Time Spent: 11:05 am - 12:15 pm: 70 Minutes  Type of Service Provided Individual Therapy  Type of Contact in-person Location: office   Mental Status Exam: Appearance:  Casual     Behavior: Appropriate  Motor: Normal, a bit fidgety   Speech/Language:  Clear and Coherent and Normal Rate  Affect: Appropriate  Mood: normal  Thought process: normal  Thought content:   WNL  Sensory/Perceptual disturbances:   WNL  Orientation: oriented to person, place, time/date, and situation  Attention: Good  Concentration: Good  Memory: WNL  Fund of knowledge:  Fair  Insight:   Fair  Judgment:  Fair  Impulse Control: Fair to good   Risk Assessment:  Danger to Self:  during the visit Sarah Shah denied SI. Her mother joined at the end due to concerns about self-injurious behaviors and described that between visits there was one incident (last week) where Sarah Shah reached out and said that she was wanting end her life and had cut herself instead. Sarah Shah indicated that in the moment that she was talking to her mother she was having suicidal thoughts but did not want to act on them (she did not report a plan or intent) so found another way to deal with them. She reported that this was the only time that she had suicidal thoughts and that the cutting was not in an attempt to end her life. She indicated that she was not feeling like that today and she felt safe going home. She agreed to talk to her mother or sister if things escalated and she began having thoughts thoughts again and mother agreed to continue to monitor her.   Self-injurious Behavior: Yes - Sarah Shah reported that since the prior visit she has cut 3 times. The most recent incident was last week. She used a razor that she uses to shave and cut in the shower. She cut the tops of her arms, with the most significant cut being on her  bicep. She did not need medical attention for any of these cuts and repeatedly indicated that she did not want to "end her life" but found that cutting helped her to deal with feeling guilty or that she had done something wrong. Triggers were identified and Sarah Shah and therapist developed plans for what to do instead.   Danger to Others: No Duty to Warn:no  Presenting Problems, Reported Symptoms, and /or Interim History: Sarah Shah presented for a session to address mood and anxiety.   Subjective: Sarah Shah presented for an individual outpatient therapy session. The following was addressed during sessions.   Sarah Shah rated her mood as a 9 out of 10 (with 1 being sad, 5 being neutral, and 10 being happy).  Sarah Shah rated her anxiety as a 5 out of 10 and irritation/frustration as a 3 out of 10 (with 10 being high). Sarah Shah reported that she had exams that were causing her stress but is doing better this week. She reported following the schedule developed last time some of the time and has liked it when she worked on it. As described above, Sarah Shah indicated that she had engaged in cutting between visits. She identified being yelled at, feeling like she did something wrong, and not being able to do things because of the amount of work she had to complete as triggers. Alternatives to cutting (holding ice, snapping a rubber band) and calming strategies were dicussed, as well  as things that she can tell herself to calm down. Sarah Shah agreed to give the razor that she had used to her sister to hold onto and reported that she was not using anything else to cut with and was not thinking of using anything else. A situation with her aunt was discussed that was the precipitating event prior to the most recent incident of cutting.   Sarah Shah provided verbal permission to have her mother join the visit. The cutting was dicussed and mother agreed to monitor Sarah Shah and to take steps (such as going to behavioral health urgent care) if things  escalated.  DBT was discussed (with information shared about [facility] and [facility]) along with intensive outpatient if things escalate. Mother and Sarah Shah were encouraged to monitor the overall level of emotion when interacting and to take breaks and walk away when emotions are getting too high.   Sarah Shah and her mother indicated that they felt safe going home today and Sarah Shah described the plan of what to do if she were feeling like cutting or beginning to experience suicidal ideation.   Interventions/Psychotherapy Techniques Used During Session: Cognitive Behavioral Therapy  Diagnosis: Depression, unspecified depression type  Anxiety  Autism spectrum disorder  MENTAL HEALTH INTERVENTIONS USED DURING TREATMENT & PATIENT'S RESPONSE TO INTERVENTIONS:  Short-term Objective addressed today:Sarah Shah will be able to use appropriate coping strategies including helpful thoughts to maintain a low level of anxiety and depressive symptoms. Mental health techniques used: Objective was addressed in session through the use of Cognitive Behavioral Therapy and discussion. Litha's response was positive in session. Progress Toward Goal: escalation in symptoms  PLAN  1. Sarah Shah will return for a therapy session.   2. Homework Given:  follow safety plans, monitor feelings, take breaks when interactions with family escalate. This homework will be reviewed with Sarah Shah at the next visit.  3. During the next session check in on SIB, mood, and anxiety.     Sarah Derby, [name]  Individual Treatment Plan  - please see the notes from 04/17/2021 & 05/01/2021 for the initial treatment plan, the updates in Winter 2024, and the goal review from 06/02/2022 for complete treatment plan information.    Problem/Need: Dalayza has a history of anxiety and mood concerns - with some ongoing challenges in these areas  Long-Term Goal #1: [name] will be able to identify and address any mood or anxiety challenges and will maintain a low level  of anxiety and depression symptoms as evidenced by self-report. Short-Term Objectives: Objective 1A: [name] will be able to rate her mood and anxiety - GOAL MET  Objective 1B: Abrina will be able to identify any negative thinking patterns that are perpetuating anxiety or depressive symptoms and replace these with more adaptive thoughts. - GOAL Partially met  Objective 1C: Abisola will be able to use appropriate coping strategies including helpful thoughts to maintain a low level of anxiety and depressive symptoms. Interventions: Cognitive Behavioral Therapy, Psychologist, occupational, Motivational Interviewing, and Psycho-education/Bibliotherapy  coping skills and other evidenced-based practices will be used to promote progress towards healthy functioning and to help manage decrease symptoms associated with their diagnosis.  Treatment Regimen: Individual skill building sessions every 2-4 weeks to address treatment goal/objective Target Date: 05/2023 Responsible Party: therapist and patient Person delivering treatment: Licensed Psychologist Sarah Derby, [name] will support the patient's ability to achieve the goals identified. Resolved:  No - Malak has made some progress, but mood and anxiety continue to be variable - goal continued   Problem/Need: Life transition -  Lorieann is in the process of completing a major life transition including graduating from high school, starting college, and potentially working. - As of 06/02/2022 Marlynn has graduated from high school and successfully completed 2 semesters of college. However she continues to have life transitions, so the problem/need section has been updated  Updated Problem/Need: Life transition - Kelcey has continuing life transitions including working towards completing her college program and getting a job.  Long-Term Goal #2: Natayla will successfully navigate these life transitions including being able to manage her stress level. Short-Term  Objectives: Objective 2A: Prabhnoor will be able to identify stressors associated with the life transitions - GOAL MET  - stressors include time management, test taking, and expanding her social circle  Objective 2B: Abbi will be able to identify strategies to help navigate stressors associated with the life transitions and implement them  Interventions: Assertiveness/Communication, Psychologist, occupational, and Psycho-education/Bibliotherapy , and other evidenced-based practices will be used to promote progress towards healthy functioning and to help manage decrease symptoms associated with their diagnosis.  Treatment Regimen: Individual skill building sessions every 2-4 weeks to address treatment goal/objective Target Date: 05/2023 Responsible Party: therapist and patient Person delivering treatment: Licensed Psychologist Sarah Derby, [name] will support the patient's ability to achieve the goals identified. Resolved:  No  Problem/Need: Life skills - Lousie needs to develop some additional skills to facilitate independent living. Long-Term Goal #3: Hamda will identify the additional life skills necessary to facilitate independent living. Short-Term Objectives: Objective 3A: Emmelia will be able to identify areas that she needs to address to increase the likelihood of her being able to successfully live independently.  - GOAL MET  - areas identified so far (as of 06/02/2022) include money management, cooking, and transportation   Objective 3B: Zaryiah will be able to create a plan to address weaknesses described in objective 3A.   Objective 3C: Shauniece will be able to implement steps on the plan developed in objective 3B. Interventions: Motivational Interviewing, Solution-Oriented/Positive Psychology, and Psycho-education/Bibliotherapy  daily life skills, and other evidenced-based practices will be used to promote progress towards healthy functioning and to help manage decrease symptoms associated with their  diagnosis.  Treatment Regimen: Individual skill building sessions every 2-4 weeks to address treatment goal/objective Target Date: 05/2023 Responsible Party: therapist and patient Person delivering treatment: Licensed Psychologist Sarah Derby, [name] will support the patient's ability to achieve the goals identified. Resolved:  No - Maebel has made some strides towards identifying areas that need to be addressed to facilitate independent living and made some progress on developing those skills, but process continues   Sarah Derby, [name]

## 2023-03-17 ENCOUNTER — Telehealth: Payer: Self-pay | Admitting: Clinical

## 2023-03-17 ENCOUNTER — Ambulatory Visit: Payer: Medicaid Other | Admitting: Registered"

## 2023-03-17 NOTE — Telephone Encounter (Signed)
 LM on identified voicemail to check in. Requested a call back if there were concerns today or if she had anything that she wanted to talk about.  [name], [name]

## 2023-03-23 ENCOUNTER — Institutional Professional Consult (permissible substitution): Payer: Medicaid Other | Admitting: Neurology

## 2023-03-23 ENCOUNTER — Encounter: Payer: Self-pay | Admitting: Neurology

## 2023-03-29 ENCOUNTER — Other Ambulatory Visit (HOSPITAL_COMMUNITY): Payer: Self-pay

## 2023-03-29 ENCOUNTER — Other Ambulatory Visit: Payer: Self-pay | Admitting: Family

## 2023-03-29 ENCOUNTER — Encounter: Payer: Self-pay | Admitting: Physician Assistant

## 2023-03-29 DIAGNOSIS — Z6841 Body Mass Index (BMI) 40.0 and over, adult: Secondary | ICD-10-CM

## 2023-03-29 DIAGNOSIS — E88819 Insulin resistance, unspecified: Secondary | ICD-10-CM

## 2023-03-29 MED ORDER — AMPHETAMINE-DEXTROAMPHET ER 30 MG PO CP24
30.0000 mg | ORAL_CAPSULE | Freq: Every morning | ORAL | 0 refills | Status: DC
Start: 2023-03-29 — End: 2023-04-30
  Filled 2023-03-29 – 2023-03-31 (×2): qty 30, 30d supply, fill #0

## 2023-03-29 MED ORDER — ZEPBOUND 7.5 MG/0.5ML ~~LOC~~ SOAJ: 7.5000 mg | SUBCUTANEOUS | 0 refills | Status: DC

## 2023-03-31 ENCOUNTER — Other Ambulatory Visit (HOSPITAL_COMMUNITY): Payer: Self-pay

## 2023-04-01 ENCOUNTER — Ambulatory Visit: Admitting: Clinical

## 2023-04-01 DIAGNOSIS — F840 Autistic disorder: Secondary | ICD-10-CM

## 2023-04-01 DIAGNOSIS — F32A Depression, unspecified: Secondary | ICD-10-CM

## 2023-04-01 DIAGNOSIS — F419 Anxiety disorder, unspecified: Secondary | ICD-10-CM | POA: Diagnosis not present

## 2023-04-01 NOTE — Progress Notes (Signed)
 [facility] Counselor/Therapist Progress Note  Patient ID: Sarah Shah, MRN: [id number]    Date: 04/01/23  Time Spent: 10:10 pm - 10:58 am: 48 Minutes  Type of Service Provided Individual Therapy  Type of Contact in-person Location: office  Mental Status Exam: Appearance:  Casual, a bit disheveled   Behavior: Sharing  Motor: Normal  Speech/Language:  Clear and Coherent and Normal Rate  Affect: Appropriate  Mood: normal  Thought process: normal  Thought content:   WNL  Sensory/Perceptual disturbances:   WNL  Orientation: oriented to person, place, time/date, and situation  Attention: Fair to good  Concentration: Fair  Memory: WNL  Fund of knowledge:  Fair  Insight:   Fair  Judgment:  Fair  Impulse Control: Fair   Risk Assessment:  Danger to Self:  No - denied SI since last meeting Self-injurious Behavior: No - denied SIB since last meeting - reported that she had some thought about cutting but has been able to follow through with plans to distract herself and did not act on the thoughts Danger to Others: No Duty to Warn:no  Presenting Problems, Reported Symptoms, and /or Interim History: Sarah Shah presented for a session to address mood and anxiety concerns.   Subjective: Sarah Shah presented for an individual outpatient therapy session. The following was addressed during sessions.   Sarah Shah rated her mood as a 9 out of 10 (with 1 being sad, 5 being neutral, and 10 being happy).  Sarah Shah rated her anxiety as a 8 out of 10 (with 10 being high). She expressed some frustration with her mother, but indicated that she is not experiencing a high level of irritation with others. One of her pets passed away, and how she has been managing her feelings about this was discussed. Sarah Shah discussed some of the challenges she has had at home and how to manage them. She has made some social connections at school and is getting along better with her sister. School was reportedly going  okay. Sarah Shah expressed that she has experienced increased anxiety and decreased confidence, so strategies to address these concerns were discussed. For example, Sarah Shah identified some helpful thoughts that she could use to help manage anxiety, along with anxiety management strategies (e.g., sorting Legos, going for a walk with her dog, video games, playing with her pets).      Interventions/Psychotherapy Techniques Used During Session: Cognitive Behavioral Therapy, Assertiveness/Communication, and Psychologist, occupational  Diagnosis: Depression, unspecified depression type  Anxiety  Autism spectrum disorder  MENTAL HEALTH INTERVENTIONS USED DURING TREATMENT & PATIENT'S RESPONSE TO INTERVENTIONS:  Short-term Objective addressed today:Sarah Shah will be able to identify any negative thinking patterns that are perpetuating anxiety or depressive symptoms and replace these with more adaptive thoughts. AND Sarah Shah will be able to use appropriate coping strategies including helpful thoughts to maintain a low level of anxiety and depressive symptoms. Mental health techniques used: Objective was addressed in session through the use of Cognitive Behavioral Therapy and discussion. [name] response was positive. Progress Toward Goal: progressing  Short-term Objective addressed today:Sarah Shah will be able to identify strategies to help navigate stressors associated with the life transitions and implement them (expanding social circle addressed today). Mental health techniques used: Objective was addressed in session through the use of Assertiveness/Communication and Psychologist, occupational and discussion. Sarah Shah's response was positive.  Progress Toward Goal: progressing    PLAN  1. Sarah Shah will return for a therapy session.   2. Homework Given:  continue to monitor mood and anxiety and implement  anxiety management strategies. This homework will be reviewed with Sarah Shah at the next visit.  3. During the next session check in on  mood, anxiety, school.     Sarah Derby, [name]   Individual Treatment Plan  - please see the notes from 04/17/2021 & 05/01/2021 for the initial treatment plan, the updates in Winter 2024, and the goal review from 06/02/2022 for complete treatment plan information.    Problem/Need: Sarah Shah has a history of anxiety and mood concerns - with some ongoing challenges in these areas  Long-Term Goal #1: [name] will be able to identify and address any mood or anxiety challenges and will maintain a low level of anxiety and depression symptoms as evidenced by self-report. Short-Term Objectives: Objective 1A: Sarah Shah will be able to rate her mood and anxiety - GOAL MET  Objective 1B: Sarah Shah will be able to identify any negative thinking patterns that are perpetuating anxiety or depressive symptoms and replace these with more adaptive thoughts. - GOAL Partially met  Objective 1C: Sarah Shah will be able to use appropriate coping strategies including helpful thoughts to maintain a low level of anxiety and depressive symptoms. Interventions: Cognitive Behavioral Therapy, Psychologist, occupational, Motivational Interviewing, and Psycho-education/Bibliotherapy  coping skills and other evidenced-based practices will be used to promote progress towards healthy functioning and to help manage decrease symptoms associated with their diagnosis.  Treatment Regimen: Individual skill building sessions every 2-4 weeks to address treatment goal/objective Target Date: 05/2023 Responsible Party: therapist and patient Person delivering treatment: Licensed Psychologist Sarah Derby, [name] will support the patient's ability to achieve the goals identified. Resolved:  No - Sarah Shah has made some progress, but mood and anxiety continue to be variable - goal continued   Problem/Need: Life transition - Sarah Shah is in the process of completing a major life transition including graduating from high school, starting college, and potentially working. - As of  06/02/2022 Sarah Shah has graduated from high school and successfully completed 2 semesters of college. However she continues to have life transitions, so the problem/need section has been updated  Updated Problem/Need: Life transition - Jahnavi has continuing life transitions including working towards completing her college program and getting a job.  Long-Term Goal #2: Sarah Shah will successfully navigate these life transitions including being able to manage her stress level. Short-Term Objectives: Objective 2A: Keshawn will be able to identify stressors associated with the life transitions - GOAL MET  - stressors include time management, test taking, and expanding her social circle  Objective 2B: Sarah Shah will be able to identify strategies to help navigate stressors associated with the life transitions and implement them  Interventions: Assertiveness/Communication, Psychologist, occupational, and Psycho-education/Bibliotherapy , and other evidenced-based practices will be used to promote progress towards healthy functioning and to help manage decrease symptoms associated with their diagnosis.  Treatment Regimen: Individual skill building sessions every 2-4 weeks to address treatment goal/objective Target Date: 05/2023 Responsible Party: therapist and patient Person delivering treatment: Licensed Psychologist Sarah Derby, [name] will support the patient's ability to achieve the goals identified. Resolved:  No  Problem/Need: Life skills - Sarah Shah needs to develop some additional skills to facilitate independent living. Long-Term Goal #3: Nakesha will identify the additional life skills necessary to facilitate independent living. Short-Term Objectives: Objective 3A: Brittny will be able to identify areas that she needs to address to increase the likelihood of her being able to successfully live independently.  - GOAL MET  - areas identified so far (as of 06/02/2022) include money management, cooking, and transportation  Objective 3B: Albertha will be able to create a plan to address weaknesses described in objective 3A.   Objective 3C: Kiarrah will be able to implement steps on the plan developed in objective 3B. Interventions: Motivational Interviewing, Solution-Oriented/Positive Psychology, and Psycho-education/Bibliotherapy  daily life skills, and other evidenced-based practices will be used to promote progress towards healthy functioning and to help manage decrease symptoms associated with their diagnosis.  Treatment Regimen: Individual skill building sessions every 2-4 weeks to address treatment goal/objective Target Date: 05/2023 Responsible Party: therapist and patient Person delivering treatment: Licensed Psychologist Sarah Derby, [name] will support the patient's ability to achieve the goals identified. Resolved:  No - Aunisty has made some strides towards identifying areas that need to be addressed to facilitate independent living and made some progress on developing those skills, but process continues   Sarah Derby, [name]

## 2023-04-07 ENCOUNTER — Encounter: Payer: Medicaid Other | Attending: Family | Admitting: Registered"

## 2023-04-07 ENCOUNTER — Encounter: Payer: Self-pay | Admitting: Registered"

## 2023-04-07 DIAGNOSIS — Z713 Dietary counseling and surveillance: Secondary | ICD-10-CM | POA: Insufficient documentation

## 2023-04-07 DIAGNOSIS — F50819 Binge eating disorder, unspecified: Secondary | ICD-10-CM | POA: Diagnosis not present

## 2023-04-07 NOTE — Patient Instructions (Signed)
 Keep up the great work!

## 2023-04-07 NOTE — Progress Notes (Signed)
 Appointment start time: 9:05 Appointment end time: 9:40  Patient was seen on 04/07/2023 for nutrition counseling pertaining to disordered eating  Primary care provider: Mort Sawyers, FNP Therapist: Darlis Loan (sees once/week, in-person)  ROI: 08/26/2022 Any other medical team members: medication management Parents: mom Eunice Blase), sister [name])   Assessment  States she is having 3 meals a day. States if she doesn't eat lunch, she has headaches to let her know she's missed her window of eating. States she is listening to your body. States she does not have as many binge episodes now. States she physically feels better since eating more frequently and recognizes the the correlation between eating and how her body feels.   States she distracts herself from feeling hungry when craving sweets and knows her body is not hungry.   States she goes to [employer] and walks with sister every other day for 40 min. States she enjoys it and walking in nature.   States she has been cutting and has discussed with therapist. States therapist recommended pt getting connected with a supportive virtual group as support and mom is helping her with this. States she hasn't started the group yet, but looking forward to it. States she hasn't been cutting recently.   Previous appt: Fasting labs are within normal limits except elevated Ferr (350 ng/mL) and elevated Glu (100 mg/dL). Reports she is also looking into getting scheduled for sleep study. States she is taking a higher dose of Zepbound; tolerating well. Following vegetarian diet. States things have been going well for her but will probably schedule an appt around Christmas because the holidays are usually hard for her. Mom reports patient has struggled with PCOS and effects of it over the years.    Eating history: Length of time:  Previous treatments: has seen RD before Goals for RD meetings:   Weight history:  Highest weight:    Lowest weight:   Most consistent weight:   What would you like to weigh: How has weight changed in the past year:   Medical Information:  Changes in hair, skin, nails since ED started:  Chewing/swallowing difficulties:  Reflux or heartburn:  Trouble with teeth:  LMP without the use of hormones:   Weight at that point:  Effect of exercise on menses:    Effect of hormones on menses:  Constipation, diarrhea:  Dizziness/lightheadedness:  Headaches/body aches:  Heart racing/chest pain:  Mood:  Sleep:  Focus/concentration:  Cold intolerance:  Vision changes:   Mental health diagnosis: BED   Dietary assessment: A typical day consists of 3 meals and 1 snacks  Safe foods include: Ramen noodles, cereal, [employer] chocolate milk with special K, soft serve ice cream from [employer], sandwich, water, bell peppers, raw broccoli, chocolate, cookies, candy, chips, bananas, apples, strawberries, kiwi, pineapple, cantaloupe, asparagus, green beans, lettuce, salads (sometimes), tomatoes, cucumbers, chicken, steak, bologna, [place], beef, bread, toast, granola, muffins, waffles/pancakes, potatoes, french fries, corn, baked beans, black beans, pinto beans, pasta, rice, mac and cheese, flip greek yogurt, cheese, Boba tea, shrimp  Avoided foods include: zucchini, onions, carrots, fish, seafood, red sauce, garlic, lemons, orange juice  24 hour recall:  B (7-8 am): toast + PB + chocolate chips or banana or 1 c cereal (yogurt and fruit cereal) + [employer]Life chocolate milk    S (1 pm): 1 c cereal (yogurt and fruit cereal) + FairLife chocolate milk  L (5 pm): veggie hummus wrap + fruit salad or 1 pepper stuffed with rice and kidney beans or skipped  or Cava-1/2 bowl (veggies, veggie meatballs, beans, rice)  S:  D (7-8 pm): Subway-12" veggie sandwich or 1 c kidney beans + 1 c rice or 10 veggie nuggets + fries or skipped due to stomach hurting  S:   Beverages: water (5*40 oz; 200 oz)  Physical activity: walking 40 min, 3-4  days/week  What Methods Do You Use To Control Your Weight (Compensatory behaviors)?           None reported   Nutrition Diagnosis: NB-1.5 Disordered eating pattern As related to binge eating disorder.  As evidenced by intake reflecting an imbalance of food groups.  Intervention/Goals: Pt was encouraged with changes made from previous visit. Pt is doing well overall. Pt agreed with goals listed. Goals: - Keep up the great work!  Meal plan:    3 meals    1-3 snacks  Monitoring and Evaluation: Patient will follow up in 4 weeks.

## 2023-04-13 ENCOUNTER — Ambulatory Visit (INDEPENDENT_AMBULATORY_CARE_PROVIDER_SITE_OTHER): Admitting: Clinical

## 2023-04-13 DIAGNOSIS — F419 Anxiety disorder, unspecified: Secondary | ICD-10-CM

## 2023-04-13 DIAGNOSIS — F32A Depression, unspecified: Secondary | ICD-10-CM | POA: Diagnosis not present

## 2023-04-13 DIAGNOSIS — F840 Autistic disorder: Secondary | ICD-10-CM

## 2023-04-13 NOTE — Progress Notes (Signed)
 [facility] Counselor/Therapist Progress Note  Patient ID: Younique Casad, MRN: [id number]    Date: 04/13/23  Time Spent: 1:00 pm - 1:50 pm: 50 Minutes  Type of Service Provided Individual Therapy  Type of Contact virtual (via Careg[employer] with real time audio and visual interaction)  Patient Location: home       Provider Location: office  [name] participated from home, via video, and consented to treatment. Therapist participated from office. Patient consented to telehealth therapy and is aware and consented to the limitations of telehealth.    Mental Status Exam: Appearance:  Casual, a bit disheveled   Behavior: Appropriate and Sharing  Motor: Normal  Speech/Language:  Clear and Coherent and Normal Rate  Affect: Appropriate  Mood: normal  Thought process: normal  Thought content:   WNL  Sensory/Perceptual disturbances:   WNL  Orientation: oriented to person, place, time/date, and situation  Attention: Good  Concentration: Good  Memory: WNL  Fund of knowledge:  Fair  Insight:   Fair  Judgment:  Fair  Impulse Control: Fair   Risk Assessment:  Danger to Self:  No - denied SI Self-injurious Behavior: No - denied SIB - indicated SIB and cutting has not occurred between visits Danger to Others: No Duty to Warn:no  - Client was informed of clinician's upcoming vacation and emergency procedures were reviewed.      Presenting Problems, Reported Symptoms, and /or Interim History: Layal presented for a session to address mood and anxiety challenges.   Subjective: Margarette presented for an individual outpatient therapy session. The following was addressed during sessions.   Sentoria reported that she has been stressed. She has experienced some sadness (indicating that she is more on the sad side of her mood scale, but not extremely sad). She is experiencing a medium amount of anxiety and irritation/frustration. Some stressors with her father and brother were  discussed, along with some sadness about being left out of a family vacation. Ways to move forward and some adjustments to her thinking that may lead to improved mood were discussed. Stressors at home were also discussed. Ottilie reported having a hard time as she is feeling like she needs to engage in "masking" all day at school and then come home and continue to need to mask. Finding people and places where she feels able to be her more genuine self were discussed. The need to balance  having to stay at home for now and therefore needing to follow some of those rules with her mental and personal health needs was discussed. Strategies that Shontell can use to increase her moments of positivity and/or to engage in activities that will enhance her mental health were discussed.    Interventions/Psychotherapy Techniques Used During Session: Cognitive Behavioral Therapy, life skills and solution focused strategies  Diagnosis: Depression, unspecified depression type  Anxiety  Autism spectrum disorder  MENTAL HEALTH INTERVENTIONS USED DURING TREATMENT & PATIENT'S RESPONSE TO INTERVENTIONS:  Short-term Objective addressed today:Joella will be able to identify any negative thinking patterns that are perpetuating anxiety or depressive symptoms and replace these with more adaptive thoughts. Reign will be able to use appropriate coping strategies including helpful thoughts to maintain a low level of anxiety and depressive symptoms. Mental health techniques used: Objective was addressed in session through the use of Cognitive Behavioral Therapy and discussion. Banita's response was positive.  Progress Toward Goal: elevated symptoms remain but Roseanne has shown growth in being able to identify depression elevating thoughts and replace them with  other more adaptive thoughts  Short-term Objective addressed today:Adaleen will be able to identify strategies to help navigate stressors associated with the life transitions and  implement them Mental health techniques used: Objective was addressed in session through the use of life skills and solution focused strategies and discussion. Adiya's response was positive. Progress Toward Goal: Kyah has expanded her social circle and talked today about perhaps focusing on learning to drive soon    PLAN  1. Evea will return for a therapy session.   2. Homework Given:  engage in activities that can increase her moments of positivity or enhance her mental health, continue to complete school work, monitor her mood and anxiety. This homework will be reviewed with [name] at the next visit.  3. During the next session check in on mood, anxiety, physical activity, school, driving.      [name], [name]  Individual Treatment Plan  - please see the notes from 04/17/2021 & 05/01/2021 for the initial treatment plan, the updates in Winter 2024, and the goal review from 06/02/2022 for complete treatment plan information.    Problem/Need: Windi has a history of anxiety and mood concerns - with some ongoing challenges in these areas  Long-Term Goal #1: Ettel will be able to identify and address any mood or anxiety challenges and will maintain a low level of anxiety and depression symptoms as evidenced by self-report. Short-Term Objectives: Objective 1A: Lanett will be able to rate her mood and anxiety - GOAL MET  Objective 1B: Nezzie will be able to identify any negative thinking patterns that are perpetuating anxiety or depressive symptoms and replace these with more adaptive thoughts. - GOAL Partially met  Objective 1C: Meliza will be able to use appropriate coping strategies including helpful thoughts to maintain a low level of anxiety and depressive symptoms. Interventions: Cognitive Behavioral Therapy, Psychologist, occupational, Motivational Interviewing, and Psycho-education/Bibliotherapy  coping skills and other evidenced-based practices will be used to promote progress towards healthy  functioning and to help manage decrease symptoms associated with their diagnosis.  Treatment Regimen: Individual skill building sessions every 2-4 weeks to address treatment goal/objective Target Date: 05/2023 Responsible Party: therapist and patient Person delivering treatment: Licensed Psychologist [name], [name] will support the patient's ability to achieve the goals identified. Resolved:  No - Fleur has made some progress, but mood and anxiety continue to be variable - goal continued   Problem/Need: Life transition - Quanesha is in the process of completing a major life transition including graduating from high school, starting college, and potentially working. - As of 06/02/2022 Aalaya has graduated from high school and successfully completed 2 semesters of college. However she continues to have life transitions, so the problem/need section has been updated  Updated Problem/Need: Life transition - Kiauna has continuing life transitions including working towards completing her college program and getting a job.  Long-Term Goal #2: Adria will successfully navigate these life transitions including being able to manage her stress level. Short-Term Objectives: Objective 2A: Nilaya will be able to identify stressors associated with the life transitions - GOAL MET  - stressors include time management, test taking, and expanding her social circle  Objective 2B: Donnesha will be able to identify strategies to help navigate stressors associated with the life transitions and implement them  Interventions: Assertiveness/Communication, Psychologist, occupational, and Psycho-education/Bibliotherapy , and other evidenced-based practices will be used to promote progress towards healthy functioning and to help manage decrease symptoms associated with their diagnosis.  Treatment Regimen: Individual skill  building sessions every 2-4 weeks to address treatment goal/objective Target Date: 05/2023 Responsible Party:  therapist and patient Person delivering treatment: Licensed Psychologist [name], [name] will support the patient's ability to achieve the goals identified. Resolved:  No  Problem/Need: Life skills - Michaila needs to develop some additional skills to facilitate independent living. Long-Term Goal #3: Mitsue will identify the additional life skills necessary to facilitate independent living. Short-Term Objectives: Objective 3A: Ishita will be able to identify areas that she needs to address to increase the likelihood of her being able to successfully live independently.  - GOAL MET  - areas identified so far (as of 06/02/2022) include money management, cooking, and transportation   Objective 3B: Fatumata will be able to create a plan to address weaknesses described in objective 3A.   Objective 3C: Addylynn will be able to implement steps on the plan developed in objective 3B. Interventions: Motivational Interviewing, Solution-Oriented/Positive Psychology, and Psycho-education/Bibliotherapy  daily life skills, and other evidenced-based practices will be used to promote progress towards healthy functioning and to help manage decrease symptoms associated with their diagnosis.  Treatment Regimen: Individual skill building sessions every 2-4 weeks to address treatment goal/objective Target Date: 05/2023 Responsible Party: therapist and patient Person delivering treatment: Licensed Psychologist [name], [name] will support the patient's ability to achieve the goals identified. Resolved:  No - Lillyona has made some strides towards identifying areas that need to be addressed to facilitate independent living and made some progress on developing those skills, but process continues    [name], [name]

## 2023-04-20 ENCOUNTER — Encounter: Payer: Self-pay | Admitting: Family

## 2023-04-20 ENCOUNTER — Ambulatory Visit (INDEPENDENT_AMBULATORY_CARE_PROVIDER_SITE_OTHER): Payer: Self-pay | Admitting: Family

## 2023-04-20 ENCOUNTER — Telehealth: Payer: Self-pay | Admitting: Family

## 2023-04-20 VITALS — BP 102/70 | HR 100 | Temp 98.1°F | Ht 65.0 in | Wt 326.0 lb

## 2023-04-20 DIAGNOSIS — Z13220 Encounter for screening for lipoid disorders: Secondary | ICD-10-CM

## 2023-04-20 DIAGNOSIS — Z0001 Encounter for general adult medical examination with abnormal findings: Secondary | ICD-10-CM | POA: Diagnosis not present

## 2023-04-20 DIAGNOSIS — E611 Iron deficiency: Secondary | ICD-10-CM

## 2023-04-20 DIAGNOSIS — G4719 Other hypersomnia: Secondary | ICD-10-CM | POA: Insufficient documentation

## 2023-04-20 DIAGNOSIS — E559 Vitamin D deficiency, unspecified: Secondary | ICD-10-CM

## 2023-04-20 DIAGNOSIS — Z6841 Body Mass Index (BMI) 40.0 and over, adult: Secondary | ICD-10-CM | POA: Diagnosis not present

## 2023-04-20 DIAGNOSIS — E282 Polycystic ovarian syndrome: Secondary | ICD-10-CM

## 2023-04-20 DIAGNOSIS — E66813 Obesity, class 3: Secondary | ICD-10-CM

## 2023-04-20 DIAGNOSIS — E538 Deficiency of other specified B group vitamins: Secondary | ICD-10-CM | POA: Diagnosis not present

## 2023-04-20 DIAGNOSIS — E88819 Insulin resistance, unspecified: Secondary | ICD-10-CM | POA: Diagnosis not present

## 2023-04-20 LAB — HEMOGLOBIN A1C: Hgb A1c MFr Bld: 5.1 % (ref 4.6–6.5)

## 2023-04-20 LAB — BASIC METABOLIC PANEL WITH GFR
BUN: 8 mg/dL (ref 6–23)
CO2: 24 meq/L (ref 19–32)
Calcium: 9.7 mg/dL (ref 8.4–10.5)
Chloride: 103 meq/L (ref 96–112)
Creatinine, Ser: 0.59 mg/dL (ref 0.40–1.20)
GFR: 128.7 mL/min (ref >60.00)
Glucose, Bld: 83 mg/dL (ref 70–99)
Potassium: 3.8 meq/L (ref 3.5–5.1)
Sodium: 140 meq/L (ref 135–145)

## 2023-04-20 LAB — LIPID PANEL
Cholesterol: 180 mg/dL (ref 0–200)
HDL: 42.7 mg/dL (ref >39.00)
LDL Cholesterol: 100 mg/dL — ABNORMAL HIGH (ref 0–99)
NonHDL: 137.35
Total CHOL/HDL Ratio: 4
Triglycerides: 185 mg/dL — ABNORMAL HIGH (ref 0.0–149.0)
VLDL: 37 mg/dL (ref 0.0–40.0)

## 2023-04-20 LAB — VITAMIN D 25 HYDROXY (VIT D DEFICIENCY, FRACTURES): VITD: 27.07 ng/mL — ABNORMAL LOW (ref 30.00–100.00)

## 2023-04-20 LAB — TSH: TSH: 2.69 u[IU]/mL (ref 0.35–5.50)

## 2023-04-20 LAB — VITAMIN B12: Vitamin B-12: 364 pg/mL (ref 211–911)

## 2023-04-20 MED ORDER — TIRZEPATIDE-WEIGHT MANAGEMENT 10 MG/0.5ML ~~LOC~~ SOLN: 10.0000 mg | SUBCUTANEOUS | 0 refills | Status: DC

## 2023-04-20 NOTE — Progress Notes (Unsigned)
 Subjective:  Patient ID: Sarah Shah, female    DOB: 01-Oct-2000  Age: 23 y.o. MRN: [id number]  Patient Care Team: Mort Sawyers, FNP as PCP - General (Family Medicine)   CC:  Chief Complaint  Patient presents with   Annual Exam    HPI Sarah Shah is a 23 y.o. female who presents today for an annual physical exam. She reports consuming a general diet.  Walking with her sister a few days a week outside but often feels very tired, she is going to school and studying often which causes her to feel more fatigued.  She generally feels well. She reports sleeping well. She does have additional problems to discuss today.  She does report excessive fatigue throughout the day and sleeping at night for the most part. Mom states she does not snore. She did get a referral for a sleep study with neurology but she missed her first appt.   Vision: overdue, has appt scheduled in the near future.  Dental: has to get a new dental office.    Last pap: has never had this.   Pt is with acute concerns.   Obesity: on zepbound 7.5 mg weekly. Skipped the last few doses , she states she 'forgets'. She was maybe four days off the third dose and then this last dose was maybe 2 days off, last dose was last week Thursday. She is due for it again this Thursday.   Advanced Directives Patient does not have advanced directives  DEPRESSION SCREENING    01/20/2023   11:33 AM 10/15/2022   11:46 AM 06/16/2022    5:13 PM 01/27/2022   10:45 AM 03/04/2021    8:59 AM 10/28/2020    4:11 PM 07/03/2020    4:59 PM  PHQ 2/9 Scores  PHQ - 2 Score 0 2 0 0 4 0 0  PHQ- 9 Score 5 6  0 10       ROS: Negative unless specifically indicated above in HPI.    Current Outpatient Medications:    Amphetamine Sulfate 10 MG TABS, Take 1 tablet by mouth daily as needed., Disp: , Rfl:    amphetamine-dextroamphetamine (ADDERALL XR) 30 MG 24 hr capsule, Take 1 capsule (30 mg total) by mouth in the morning., Disp: 30 capsule, Rfl:  0   busPIRone (BUSPAR) 30 MG tablet, Take 30 mg by mouth 2 (two) times daily., Disp: , Rfl:    cloNIDine (CATAPRES) 0.2 MG tablet, Take 0.2 mg by mouth 2 (two) times daily., Disp: , Rfl:    desogestrel-ethinyl estradiol (VOLNEA) 0.15-0.02/0.01 MG (21/5) tablet, TAKE 1 TABLET BY MOUTH EVERY DAY. TAKE ACTIVE TABLETS ONLY, SKIP PLACEBO TABLETS., Disp: 90 tablet, Rfl: 0   docusate sodium (COLACE) 100 MG capsule, Take 100 mg by mouth at bedtime., Disp: , Rfl:    famotidine (PEPCID) 20 MG tablet, Take 20 mg by mouth daily., Disp: , Rfl:    ferrous sulfate 325 (65 FE) MG tablet, Take 325 mg by mouth at bedtime., Disp: , Rfl:    omeprazole (PRILOSEC) 40 MG capsule, Take 1 capsule (40 mg total) by mouth daily., Disp: 90 capsule, Rfl: 4   tirzepatide 10 MG/0.5ML injection vial, Inject 10 mg into the skin once a week., Disp: 2 mL, Rfl: 0   venlafaxine XR (EFFEXOR-XR) 150 MG 24 hr capsule, Take 150 mg by mouth daily., Disp: , Rfl:    venlafaxine XR (EFFEXOR-XR) 75 MG 24 hr capsule, Take 75 mg by mouth daily., Disp: , Rfl:  vitamin C (ASCORBIC ACID) 500 MG tablet, Take 500 mg by mouth at bedtime., Disp: , Rfl:     Objective:    BP 102/70 (BP Location: Right Arm, Patient Position: Sitting, Cuff Size: Large)   Pulse 100   Temp 98.1 F (36.7 C) (Temporal)   Ht 5\' 5"  (1.651 m)   Wt (!) 326 lb (147.9 kg)   SpO2 99%   BMI 54.25 kg/m   BP Readings from Last 3 Encounters:  04/20/23 102/70  02/23/23 (!) 109/52  01/20/23 116/74      Physical Exam Vitals reviewed.  Constitutional:      General: She is not in acute distress.    Appearance: Normal appearance. She is obese. She is not ill-appearing.  HENT:     Head: Normocephalic.     Right Ear: Tympanic membrane normal.     Left Ear: Tympanic membrane normal.     Nose: Nose normal.     Mouth/Throat:     Mouth: Mucous membranes are moist.  Eyes:     Extraocular Movements: Extraocular movements intact.     Pupils: Pupils are equal, round, and  reactive to light.  Cardiovascular:     Rate and Rhythm: Normal rate and regular rhythm.  Pulmonary:     Effort: Pulmonary effort is normal.     Breath sounds: Normal breath sounds.  Abdominal:     General: Abdomen is flat. Bowel sounds are normal.     Palpations: Abdomen is soft.     Tenderness: There is no guarding or rebound.  Musculoskeletal:        General: Normal range of motion.     Cervical back: Normal range of motion.  Skin:    General: Skin is warm.     Capillary Refill: Capillary refill takes less than 2 seconds.  Neurological:     General: No focal deficit present.     Mental Status: She is alert.  Psychiatric:        Mood and Affect: Mood normal.        Behavior: Behavior normal.        Thought Content: Thought content normal.        Judgment: Judgment normal.     Wt Readings from Last 3 Encounters:  04/20/23 (!) 326 lb (147.9 kg)  02/23/23 (!) 328 lb 1.6 oz (148.8 kg)  01/20/23 (!) 332 lb 9.6 oz (150.9 kg)         Assessment & Plan:  Class 3 severe obesity due to excess calories with serious comorbidity and body mass index (BMI) of 50.0 to 59.9 in adult [facility]) Assessment & Plan: Increase zepbound to 10 mg weekly  Pt advised to work on diet and exercise as tolerated   Orders: -     Tirzepatide-Weight Management; Inject 10 mg into the skin once a week.  Dispense: 2 mL; Refill: 0 -     Ambulatory referral to Sleep Studies -     TSH  Excessive daytime sleepiness Assessment & Plan: Concern for sleep apnea  Ordering sleep study pending results.    Orders: -     Ambulatory referral to Sleep Studies  Encounter for general adult medical examination with abnormal findings Assessment & Plan: Patient Counseling(The following topics were reviewed):  Preventative care handout given to pt  Health maintenance and immunizations reviewed. Please refer to Health maintenance section. Pt advised on safe sex, wearing seatbelts in car, and proper  nutrition labwork ordered today for annual Dental health: Discussed importance  of regular tooth brushing, flossing, and dental visits.     Orders: -     Lipid panel -     Hemoglobin A1c -     Basic metabolic panel with GFR -     Ambulatory referral to Sleep Studies  Low serum vitamin B12 -     Vitamin B12  Insulin resistance -     Hemoglobin A1c -     Basic metabolic panel with GFR  Vitamin D deficiency Assessment & Plan: Ordered vitamin d pending results.    Orders: -     VITAMIN D 25 Hydroxy (Vit-D Deficiency, Fractures)  PCOS (polycystic ovarian syndrome) Assessment & Plan: Cont ocp     Iron deficiency Assessment & Plan: Cont f/u with hematology     Acute concerns on top of physical exam totaled 12 minutes additional going over acute concerns and reviewing charts.    Follow-up: Return in about 3 months (around 07/20/2023) for f/u weight loss medication.   Mort Sawyers, FNP

## 2023-04-20 NOTE — Assessment & Plan Note (Signed)
 Increase zepbound to 10 mg weekly  Pt advised to work on diet and exercise as tolerated

## 2023-04-20 NOTE — Assessment & Plan Note (Signed)
 Concern for sleep apnea  Ordering sleep study pending results.

## 2023-04-20 NOTE — Assessment & Plan Note (Signed)
Cont f/u with hematology

## 2023-04-20 NOTE — Assessment & Plan Note (Signed)
 Ordered vitamin d pending results.

## 2023-04-20 NOTE — Patient Instructions (Signed)
  A referral was placed today for a sleep study Please let us know if you have not heard back within 2 weeks about the referral.

## 2023-04-20 NOTE — Assessment & Plan Note (Signed)
Cont ocp

## 2023-04-20 NOTE — Assessment & Plan Note (Signed)
 Patient Counseling(The following topics were reviewed): ? Preventative care handout given to pt  ?Health maintenance and immunizations reviewed. Please refer to Health maintenance section. ?Pt advised on safe sex, wearing seatbelts in car, and proper nutrition ?labwork ordered today for annual ?Dental health: Discussed importance of regular tooth brushing, flossing, and dental visits. ? ? ?

## 2023-04-20 NOTE — Telephone Encounter (Signed)
 Received a fax from [employer]. Greggory Keen is not covered by the pt's plan. Preferred alternatives are Ozempic, Trulicity and Victoza.  Tabitha - please advise on medication change.

## 2023-04-21 ENCOUNTER — Other Ambulatory Visit: Payer: Self-pay | Admitting: Family

## 2023-04-21 DIAGNOSIS — E66813 Obesity, class 3: Secondary | ICD-10-CM

## 2023-04-21 DIAGNOSIS — Z6841 Body Mass Index (BMI) 40.0 and over, adult: Secondary | ICD-10-CM

## 2023-04-21 DIAGNOSIS — E88819 Insulin resistance, unspecified: Secondary | ICD-10-CM

## 2023-04-21 DIAGNOSIS — E282 Polycystic ovarian syndrome: Secondary | ICD-10-CM

## 2023-04-21 MED ORDER — ZEPBOUND 10 MG/0.5ML ~~LOC~~ SOAJ: 10.0000 mg | SUBCUTANEOUS | 0 refills | Status: DC

## 2023-04-21 NOTE — Telephone Encounter (Signed)
 Rx was for zepbound.  Will send again.  Pt not on mounjaro and not diabetic.

## 2023-04-22 ENCOUNTER — Encounter: Payer: Self-pay | Admitting: Family

## 2023-04-30 ENCOUNTER — Other Ambulatory Visit (HOSPITAL_COMMUNITY): Payer: Self-pay

## 2023-04-30 MED ORDER — AMPHETAMINE-DEXTROAMPHET ER 30 MG PO CP24
30.0000 mg | ORAL_CAPSULE | Freq: Every morning | ORAL | 0 refills | Status: DC
  Filled 2023-04-30: qty 30, 30d supply, fill #0

## 2023-05-04 ENCOUNTER — Ambulatory Visit (INDEPENDENT_AMBULATORY_CARE_PROVIDER_SITE_OTHER): Admitting: Clinical

## 2023-05-04 DIAGNOSIS — F32A Depression, unspecified: Secondary | ICD-10-CM | POA: Diagnosis not present

## 2023-05-04 DIAGNOSIS — F840 Autistic disorder: Secondary | ICD-10-CM | POA: Diagnosis not present

## 2023-05-04 DIAGNOSIS — F419 Anxiety disorder, unspecified: Secondary | ICD-10-CM

## 2023-05-04 NOTE — Progress Notes (Signed)
 [facility] Counselor/Therapist Progress Note  Patient ID: Sarah Shah, MRN: [id number]    Date: 05/04/23  Time Spent: 2:00 pm - 2:58 pm: 58 Minutes  Type of Service Provided Individual Therapy  Type of Contact in-person Location: office  Mental Status Exam: Appearance:  Casual and Fairly Groomed     Behavior: Sharing  Motor: Normal  Speech/Language:  Normal Rate, stories were at times a bit challenging to follow as [name] tended to jump around   Affect: Somewhat labile  Mood: irritable  Thought process: normal  Thought content:   WNL  Sensory/Perceptual disturbances:   WNL  Orientation: oriented to person, place, time/date, and situation  Attention: Good  Concentration: Good  Memory: WNL  Fund of knowledge:  Fair  Insight:   Fair  Judgment:  Fair  Impulse Control: Fair   Risk Assessment:  Danger to Self:  No - Linnell denied any SI between visits Self-injurious Behavior:  Piccola  engaged in one incident of cutting. She used a razor to make three cuts to the backs of her arms. She denied that she was trying to kill herself when engaging in the cutting. She reported that she engaged in this behavior last week after her dog ran away and she felt guilty about it. He has not had any cutting since and the cuts did not require medical attention. She agreed to throw away her razor and to talk with her family if feeling the urge again. She also developed a plan to keep herself safe if she were feeling like engaging in this behavior again (going to her shed to interact with her [place] pigs). Vermell was also reminded of resources like behavioral health urgent care and reminded that when she went there before she had a positive experience.  Danger to Others: No Duty to Warn:no  Presenting Problems, Reported Symptoms, and /or Interim History: Isyss presented for a session to address anxiety, mood, and home life challenges.   Subjective: Aleksia presented for an individual  outpatient therapy session. The following was addressed during sessions.   Brieana rated her mood as a 4 out of 10 (with 1 being sad, 5 being neutral, and 10 being happy).  Aijah rated her anxiety as a 6 out of 10 and irritation/frustration as a 9 out of 10 (with 10 being high). Sila discussed several significant home and school stressors (e.g., she overslept and missed a test, her dog needed surgery, etc) as well as an apparent falling out with the peer group that she had been spending time with. Strategies to manage this stress were discussed. One significant factor associated with her frustration is feeling a lack of autonomy. Long terms plans to address and increase her ability to make decisions for her self and develop more independence were discussed. [name] also discussed some identity exploration that she has been engaging in and how this may be impacting her overall mood and anxiety.   Interventions/Psychotherapy Techniques Used During Session: Cognitive Behavioral Therapy  Diagnosis: Depression, unspecified depression type  Anxiety  Autism spectrum disorder  MENTAL HEALTH INTERVENTIONS USED DURING TREATMENT & PATIENT'S RESPONSE TO INTERVENTIONS:  Short-term Objective addressed today:Aniesha will be able to use appropriate coping strategies including helpful thoughts to maintain a low level of anxiety and depressive symptoms. Mental health techniques used: Objective was addressed in session through the use of  Cognitive Behavioral Therapy and discussion. Lynzie's response during session was positive but she is experiencing an increase in symptoms overall.  Progress Toward Goal:  Jonet has been experiencing some significant stressors which has resulted in an increase in symptoms overall    PLAN  1. Aoife will return for a therapy session.   2. Homework Given:  continue to engage in daily stress, anxiety, and mood management strategies, think about long term goals and how to move towards those.  This homework will be reviewed with Sherline Distel at the next visit.  3. During the next session check in on anxiety, mood, and stress, check in on cutting and [facility] group referral that was made during prior visits.     Donneta Gaines, [name]  Individual Treatment Plan  - please see the notes from 04/17/2021 & 05/01/2021 for the initial treatment plan, the updates in Winter 2024, and the goal review from 06/02/2022 for complete treatment plan information.    Problem/Need: Clarisse has a history of anxiety and mood concerns - with some ongoing challenges in these areas  Long-Term Goal #1: Tanyika will be able to identify and address any mood or anxiety challenges and will maintain a low level of anxiety and depression symptoms as evidenced by self-report. Short-Term Objectives: Objective 1A: Dayzee will be able to rate her mood and anxiety - GOAL MET  Objective 1B: Aron will be able to identify any negative thinking patterns that are perpetuating anxiety or depressive symptoms and replace these with more adaptive thoughts. - GOAL Partially met  Objective 1C: Deann will be able to use appropriate coping strategies including helpful thoughts to maintain a low level of anxiety and depressive symptoms. Interventions: Cognitive Behavioral Therapy, Psychologist, occupational, Motivational Interviewing, and Psycho-education/Bibliotherapy  coping skills and other evidenced-based practices will be used to promote progress towards healthy functioning and to help manage decrease symptoms associated with their diagnosis.  Treatment Regimen: Individual skill building sessions every 2-4 weeks to address treatment goal/objective Target Date: 05/2023 Responsible Party: therapist and patient Person delivering treatment: Licensed Psychologist Donneta Gaines, [name] will support the patient's ability to achieve the goals identified. Resolved:  No - Ilah has made some progress, but mood and anxiety continue to be variable - goal continued    Problem/Need: Life transition - Shirlie is in the process of completing a major life transition including graduating from high school, starting college, and potentially working. - As of 06/02/2022 Aloma has graduated from high school and successfully completed 2 semesters of college. However she continues to have life transitions, so the problem/need section has been updated  Updated Problem/Need: Life transition - Mardy has continuing life transitions including working towards completing her college program and getting a job.  Long-Term Goal #2: Emilee will successfully navigate these life transitions including being able to manage her stress level. Short-Term Objectives: Objective 2A: Odesser will be able to identify stressors associated with the life transitions - GOAL MET  - stressors include time management, test taking, and expanding her social circle  Objective 2B: Zykerria will be able to identify strategies to help navigate stressors associated with the life transitions and implement them  Interventions: Assertiveness/Communication, Psychologist, occupational, and Psycho-education/Bibliotherapy , and other evidenced-based practices will be used to promote progress towards healthy functioning and to help manage decrease symptoms associated with their diagnosis.  Treatment Regimen: Individual skill building sessions every 2-4 weeks to address treatment goal/objective Target Date: 05/2023 Responsible Party: therapist and patient Person delivering treatment: Licensed Psychologist Donneta Gaines, [name] will support the patient's ability to achieve the goals identified. Resolved:  No  Problem/Need: Life skills - Myleen needs to develop  some additional skills to facilitate independent living. Long-Term Goal #3: Kadence will identify the additional life skills necessary to facilitate independent living. Short-Term Objectives: Objective 3A: Dianelly will be able to identify areas that she needs to address to  increase the likelihood of her being able to successfully live independently.  - GOAL MET  - areas identified so far (as of 06/02/2022) include money management, cooking, and transportation   Objective 3B: Rasha will be able to create a plan to address weaknesses described in objective 3A.   Objective 3C: Velora will be able to implement steps on the plan developed in objective 3B. Interventions: Motivational Interviewing, Solution-Oriented/Positive Psychology, and Psycho-education/Bibliotherapy  daily life skills, and other evidenced-based practices will be used to promote progress towards healthy functioning and to help manage decrease symptoms associated with their diagnosis.  Treatment Regimen: Individual skill building sessions every 2-4 weeks to address treatment goal/objective Target Date: 05/2023 Responsible Party: therapist and patient Person delivering treatment: Licensed Psychologist Donneta Gaines, [name] will support the patient's ability to achieve the goals identified. Resolved:  No - Darlena has made some strides towards identifying areas that need to be addressed to facilitate independent living and made some progress on developing those skills, but process continues    Donneta Gaines, [name]

## 2023-05-10 ENCOUNTER — Encounter: Payer: Self-pay | Admitting: Emergency Medicine

## 2023-05-10 ENCOUNTER — Ambulatory Visit: Admission: EM | Admit: 2023-05-10 | Discharge: 2023-05-10 | Disposition: A | Discharge status: Home / Self Care

## 2023-05-10 DIAGNOSIS — H66001 Acute suppurative otitis media without spontaneous rupture of ear drum, right ear: Secondary | ICD-10-CM

## 2023-05-10 DIAGNOSIS — H7091 Unspecified mastoiditis, right ear: Secondary | ICD-10-CM | POA: Diagnosis not present

## 2023-05-10 MED ORDER — FLUCONAZOLE 150 MG PO TABS: 150.0000 mg | ORAL_TABLET | ORAL | 0 refills | Status: DC | PRN

## 2023-05-10 MED ORDER — AMOXICILLIN-POT CLAVULANATE 875-125 MG PO TABS: 1.0000 | ORAL_TABLET | Freq: Two times a day (BID) | ORAL | 0 refills | Status: AC

## 2023-05-10 MED ORDER — DEXAMETHASONE SODIUM PHOSPHATE 10 MG/ML IJ SOLN
10.0000 mg | Freq: Once | INTRAMUSCULAR | Status: AC
  Administered 2023-05-10: 10 mg via INTRAMUSCULAR

## 2023-05-10 MED ORDER — PREDNISONE 20 MG PO TABS: 20.0000 mg | ORAL_TABLET | Freq: Two times a day (BID) | ORAL | 0 refills | Status: AC

## 2023-05-10 NOTE — Discharge Instructions (Addendum)
 Start Augmentin  take twice daily for total of 10 days.  Take 1 dose of Diflucan  with your first dose of antibiotics and repeat in 3 days to prevent antibiotic induced yeast infection.  Take first dose of steroid tomorrow as you received a steroid injection in clinic.  Take 20 mg twice daily for 4 days of prednisone  and complete entire course.  Symptoms should gradually improve return as needed.

## 2023-05-10 NOTE — ED Provider Notes (Signed)
 [facility]    CSN: [id number] Arrival date & time: 05/10/23  1849      History   Chief Complaint Chief Complaint  Patient presents with   Otalgia    HPI Sarah Shah is a 23 y.o. female.    Otalgia Patient here today for evaluation of right ear pain and swelling behind the ear.  Symptoms have been present 3 days and have progressively worsened.  Patient experiencing any pain in the left ear.  Patient has not had any known fever.  Past Medical History:  Diagnosis Date   ADHD (attention deficit hyperactivity disorder)    Anemia    Anxiety    Autism spectrum    Constipated    Constipation    Depression    Development delay    GERD (gastroesophageal reflux disease)    Headache    Insulin resistance    Iron deficiency    Learning disability    Obesity    ODD (oppositional defiant disorder)    PCOS (polycystic ovarian syndrome)    PMDD (premenstrual dysphoric disorder)    Pneumonia    3 mos old and 69 mos old   Visual acuity reduced    glasses    Patient Active Problem List   Diagnosis Date Noted   Excessive daytime sleepiness 04/20/2023   Vegetarian diet 10/15/2022   Metabolic syndrome 05/28/2022   Status post laparoscopic cholecystectomy 03/31/2022   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   Oppositional defiant disorder 02/16/2022   Generalized anxiety disorder 02/16/2022   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   Migraines 10/03/2021   Abnormal CT of the abdomen    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/2020   Iron deficiency 04/25/2020   Learning disability    GERD (gastroesophageal reflux disease) 07/20/2019   Vitamin D  deficiency 07/18/2019   Severe episode of recurrent major depressive disorder, 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/25/2019   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   PCOS (polycystic ovarian syndrome) 10/05/2016   Flat feet, bilateral 12/19/2015   Insulin resistance 12/03/2015   Class 3 severe obesity with serious comorbidity and  body mass index (BMI) of 50.0 to 59.9 in adult [facility]) 08/27/2014   Autism spectrum disorder    Delay in development 11/29/2006   Developmental delay 11/29/2006   Attention deficit hyperactivity disorder (ADHD), combined type 05/13/2006    Past Surgical History:  Procedure Laterality Date   BIOPSY  02/17/2021   Procedure: BIOPSY;  Surgeon: Tobin Forts, MD;  Location: [facility];  Service: Endoscopy;;   CHOLECYSTECTOMY  09/17/2021   COLONOSCOPY WITH PROPOFOL  N/A 02/17/2021   Procedure: COLONOSCOPY WITH PROPOFOL ;  Surgeon: Tobin Forts, MD;  Location: [facility];  Service: Endoscopy;  Laterality: N/A;   ESOPHAGOGASTRODUODENOSCOPY (EGD) WITH PROPOFOL  N/A 02/17/2021   Procedure: ESOPHAGOGASTRODUODENOSCOPY (EGD) WITH PROPOFOL ;  Surgeon: Tobin Forts, MD;  Location: [facility];  Service: Endoscopy;  Laterality: N/A;   TOOTH EXTRACTION N/A 01/03/2018   Procedure: SURGICAL EXTRACTION OF TEETH #1, 16, 17, 32;  Surgeon: Joseph Nickel, DMD;  Location: [facility] OR;  Service: Oral Surgery;  Laterality: N/A;    OB History     Gravida  0   Para  0   Term  0   Preterm  0   AB  0   Living  0      SAB  0   IAB  0   Ectopic  0   Multiple  0   Live Births  0  Home Medications    Prior to Admission medications   Medication Sig Start Date End Date Taking? Authorizing Provider  acetaminophen  (TYLENOL ) 325 MG tablet Take 650 mg by mouth every 6 (six) hours as needed.   Yes [provider]  amoxicillin -clavulanate (AUGMENTIN ) 875-125 MG tablet Take 1 tablet by mouth 2 (two) times daily for 10 days. 05/10/23 05/20/23 Yes Buena Carmine, NP  Amphetamine  Sulfate 10 MG TABS Take 1 tablet by mouth daily as needed. 01/29/23  Yes [provider]  amphetamine -dextroamphetamine  (ADDERALL XR) 30 MG 24 hr capsule Take 1 capsule (30 mg total) by mouth every morning. 04/30/23  Yes   busPIRone  (BUSPAR ) 30 MG tablet Take 30 mg by mouth 2 (two) times daily.   Yes  [provider]  cloNIDine  (CATAPRES ) 0.2 MG tablet Take 0.2 mg by mouth 2 (two) times daily. 06/23/22  Yes [provider]  desogestrel -ethinyl estradiol  (VOLNEA ) 0.15-0.02/0.01 MG (21/5) tablet TAKE 1 TABLET BY MOUTH EVERY DAY. TAKE ACTIVE TABLETS ONLY, SKIP PLACEBO TABLETS. 01/17/23  Yes Dugal, Tabitha, FNP  docusate sodium (COLACE) 100 MG capsule Take 100 mg by mouth at bedtime.   Yes [provider]  famotidine  (PEPCID ) 20 MG tablet Take 20 mg by mouth daily.   Yes [provider]  ferrous sulfate 325 (65 FE) MG tablet Take 325 mg by mouth at bedtime.   Yes [provider]  fluconazole  (DIFLUCAN ) 150 MG tablet Take 1 tablet (150 mg total) by mouth every three (3) days as needed. Repeat if needed 05/10/23  Yes Buena Carmine, NP  omeprazole  (PRILOSEC) 40 MG capsule Take 1 capsule (40 mg total) by mouth daily. 07/15/22  Yes Nandigam, [name] V, MD  predniSONE  (DELTASONE ) 20 MG tablet Take 1 tablet (20 mg total) by mouth 2 (two) times daily with a meal for 4 days. 05/10/23 05/14/23 Yes Buena Carmine, NP  tirzepatide  (ZEPBOUND ) 10 MG/0.5ML Pen Inject 10 mg into the skin once a week. 04/21/23  Yes Dugal, Tabitha, FNP  venlafaxine XR (EFFEXOR-XR) 150 MG 24 hr capsule Take 150 mg by mouth daily. 07/15/22  Yes [provider]  venlafaxine XR (EFFEXOR-XR) 75 MG 24 hr capsule Take 75 mg by mouth daily. 09/11/22  Yes [provider]  vitamin C (ASCORBIC ACID) 500 MG tablet Take 500 mg by mouth at bedtime.    [provider]    Family History Family History  Problem Relation Age of Onset   Diabetes Mother    Hypertension Mother    Anxiety disorder Mother    Other Mother        Premenstrual dysphoic disorder   Obesity Mother    Stroke Mother    Colon polyps Mother    Irritable bowel syndrome Mother    Diabetes Father        type 1   Depression Father    Anxiety disorder Father    Obesity Father    Anxiety disorder Sister     ADD / ADHD Sister        ADD   Other Sister        Premenstrual dysphoric disorder   Atrial fibrillation Maternal Grandmother    Diabetes Maternal Grandmother    Heart failure Maternal Grandmother    Hypertension Maternal Grandfather    Iron deficiency Maternal Aunt    Breast cancer Maternal Aunt     Social History Social History   Tobacco Use   Smoking status: Never    Passive exposure: Current  Smokeless tobacco: Never  Vaping Use   Vaping status: Never Used  Substance Use Topics   Alcohol use: No   Drug use: Never     Allergies   Wound dressing adhesive and Semaglutide    Review of Systems Review of Systems  HENT:  Positive for ear pain.      Physical Exam Triage Vital Signs ED Triage Vitals  Encounter Vitals Group     BP 05/10/23 1951 (!) 120/95     Systolic BP Percentile --      Diastolic BP Percentile --      Pulse Rate 05/10/23 1951 90     Resp 05/10/23 1951 18     Temp 05/10/23 1951 98.6 F (37 C)     Temp Source 05/10/23 1951 Oral     SpO2 05/10/23 1951 97 %     Weight 05/10/23 1950 (!) 326 lb 1 oz (147.9 kg)     Height --      Head Circumference --      Peak Flow --      Pain Score 05/10/23 1947 6     Pain Loc --      Pain Education --      Exclude from Growth Chart --    No data found.  Updated Vital Signs BP (!) 120/95 (BP Location: Left Wrist)   Pulse 90   Temp 98.6 F (37 C) (Oral)   Resp 18   Wt (!) 326 lb 1 oz (147.9 kg)   LMP  (LMP Unknown)   SpO2 97%   BMI 54.26 kg/m   Visual Acuity Right Eye Distance:   Left Eye Distance:   Bilateral Distance:    Right Eye Near:   Left Eye Near:    Bilateral Near:     Physical Exam Vitals reviewed.  Constitutional:      Appearance: Normal appearance.  HENT:     Head: Normocephalic and atraumatic.      Right Ear: Decreased hearing noted. Swelling and tenderness present. Tympanic membrane is bulging.     Left Ear: Hearing, tympanic membrane, ear canal and external ear normal.   Cardiovascular:     Rate and Rhythm: Normal rate and regular rhythm.  Pulmonary:     Breath sounds: Normal breath sounds.  Skin:    General: Skin is warm and dry.     Capillary Refill: Capillary refill takes less than 2 seconds.  Neurological:     General: No focal deficit present.     Mental Status: She is alert and oriented to person, place, and time.      UC Treatments / Results  Labs (all labs ordered are listed, but only abnormal results are displayed) Labs Reviewed - No data to display  EKG   Radiology No results found.  Procedures Procedures (including critical care time)  Medications Ordered in UC Medications  dexamethasone  (DECADRON ) injection 10 mg (has no administration in time range)    Initial Impression / Assessment and Plan / UC Course  I have reviewed the triage vital signs and the nursing notes.  Pertinent labs & imaging results that were available during my care of the patient were reviewed by me and considered in my medical decision making (see chart for details).    Nonrecurrent acute otitis media involving the right ear with mastoiditis involving the right side empiric treatment with mitten twice daily for 10 days, Diflucan  prescribed for prophylaxis against antibiotic induced yeast infection.  Prednisone  20 mg twice daily for  4 days for treatment of mastoid pain and swelling.  Return precautions given if symptoms worsen or do not improve.  Initial dose of prednisone  given here in clinic as the pharmacy is closed. Final Clinical Impressions(s) / UC Diagnoses   Final diagnoses:  Non-recurrent acute suppurative otitis media of right ear without spontaneous rupture of tympanic membrane  Mastoiditis of right side     Discharge Instructions      Start Augmentin  take twice daily for total of 10 days.  Take 1 dose of Diflucan  with your first dose of antibiotics and repeat in 3 days to prevent antibiotic induced yeast infection.  Take first dose of  steroid tomorrow as you received a steroid injection in clinic.  Take 20 mg twice daily for 4 days of prednisone  and complete entire course.  Symptoms should gradually improve return as needed.   ED Prescriptions     Medication Sig Dispense Auth. Provider   predniSONE  (DELTASONE ) 20 MG tablet Take 1 tablet (20 mg total) by mouth 2 (two) times daily with a meal for 4 days. 8 tablet Buena Carmine, NP   amoxicillin -clavulanate (AUGMENTIN ) 875-125 MG tablet Take 1 tablet by mouth 2 (two) times daily for 10 days. 20 tablet Buena Carmine, NP   fluconazole  (DIFLUCAN ) 150 MG tablet Take 1 tablet (150 mg total) by mouth every three (3) days as needed. Repeat if needed 2 tablet Buena Carmine, NP      PDMP not reviewed this encounter.   Buena Carmine, NP 05/11/23 1510

## 2023-05-10 NOTE — ED Triage Notes (Signed)
 Pt c/o otalgia on right side x 3 days.

## 2023-05-11 ENCOUNTER — Ambulatory Visit

## 2023-05-12 ENCOUNTER — Ambulatory Visit: Admitting: Registered"

## 2023-05-16 ENCOUNTER — Emergency Department (HOSPITAL_BASED_OUTPATIENT_CLINIC_OR_DEPARTMENT_OTHER)

## 2023-05-16 ENCOUNTER — Emergency Department (HOSPITAL_BASED_OUTPATIENT_CLINIC_OR_DEPARTMENT_OTHER)
Admission: EM | Admit: 2023-05-16 | Discharge: 2023-05-16 | Disposition: A | Discharge status: Home / Self Care | Attending: Emergency Medicine | Admitting: Emergency Medicine

## 2023-05-16 ENCOUNTER — Other Ambulatory Visit: Payer: Self-pay

## 2023-05-16 ENCOUNTER — Encounter (HOSPITAL_BASED_OUTPATIENT_CLINIC_OR_DEPARTMENT_OTHER): Payer: Self-pay

## 2023-05-16 ENCOUNTER — Encounter: Payer: Self-pay | Admitting: Emergency Medicine

## 2023-05-16 ENCOUNTER — Ambulatory Visit: Admission: EM | Admit: 2023-05-16 | Discharge: 2023-05-16 | Disposition: A | Discharge status: Home / Self Care

## 2023-05-16 DIAGNOSIS — D72829 Elevated white blood cell count, unspecified: Secondary | ICD-10-CM | POA: Insufficient documentation

## 2023-05-16 DIAGNOSIS — F840 Autistic disorder: Secondary | ICD-10-CM | POA: Insufficient documentation

## 2023-05-16 DIAGNOSIS — Z79899 Other long term (current) drug therapy: Secondary | ICD-10-CM | POA: Insufficient documentation

## 2023-05-16 DIAGNOSIS — H60531 Acute contact otitis externa, right ear: Secondary | ICD-10-CM | POA: Diagnosis not present

## 2023-05-16 DIAGNOSIS — H60501 Unspecified acute noninfective otitis externa, right ear: Secondary | ICD-10-CM

## 2023-05-16 DIAGNOSIS — H66001 Acute suppurative otitis media without spontaneous rupture of ear drum, right ear: Secondary | ICD-10-CM | POA: Diagnosis not present

## 2023-05-16 DIAGNOSIS — H9201 Otalgia, right ear: Secondary | ICD-10-CM | POA: Diagnosis present

## 2023-05-16 DIAGNOSIS — H6091 Unspecified otitis externa, right ear: Secondary | ICD-10-CM | POA: Diagnosis not present

## 2023-05-16 DIAGNOSIS — M799 Soft tissue disorder, unspecified: Secondary | ICD-10-CM | POA: Diagnosis not present

## 2023-05-16 DIAGNOSIS — H7090 Unspecified mastoiditis, unspecified ear: Secondary | ICD-10-CM | POA: Diagnosis not present

## 2023-05-16 DIAGNOSIS — H6691 Otitis media, unspecified, right ear: Secondary | ICD-10-CM | POA: Diagnosis not present

## 2023-05-16 DIAGNOSIS — H70001 Acute mastoiditis without complications, right ear: Secondary | ICD-10-CM | POA: Diagnosis not present

## 2023-05-16 LAB — BASIC METABOLIC PANEL WITH GFR
Anion gap: 14 (ref 5–15)
BUN: 7 mg/dL (ref 6–20)
CO2: 22 mmol/L (ref 22–32)
Calcium: 10 mg/dL (ref 8.9–10.3)
Chloride: 102 mmol/L (ref 98–111)
Creatinine, Ser: 0.7 mg/dL (ref 0.44–1.00)
GFR, Estimated: >60 mL/min (ref >60)
Glucose, Bld: 81 mg/dL (ref 70–99)
Potassium: 4.1 mmol/L (ref 3.5–5.1)
Sodium: 137 mmol/L (ref 135–145)

## 2023-05-16 LAB — CBC
HCT: 43.9 % (ref 36.0–46.0)
Hemoglobin: 15.3 g/dL — ABNORMAL HIGH (ref 12.0–15.0)
MCH: 28.8 pg (ref 26.0–34.0)
MCHC: 34.9 g/dL (ref 30.0–36.0)
MCV: 82.7 fL (ref 80.0–100.0)
Platelets: 322 10*3/uL (ref 150–400)
RBC: 5.31 MIL/uL — ABNORMAL HIGH (ref 3.87–5.11)
RDW: 12.3 % (ref 11.5–15.5)
WBC: 10.9 10*3/uL — ABNORMAL HIGH (ref 4.0–10.5)
nRBC: 0 % (ref 0.0–0.2)

## 2023-05-16 LAB — HCG, QUANTITATIVE, PREGNANCY: hCG, Beta Chain, Quant, S: <1 m[IU]/mL (ref <5)

## 2023-05-16 MED ORDER — CEFUROXIME AXETIL 250 MG PO TABS: 250.0000 mg | ORAL_TABLET | Freq: Two times a day (BID) | ORAL | 0 refills | Status: AC

## 2023-05-16 MED ORDER — OFLOXACIN 0.3 % OT SOLN: 10.0000 [drp] | Freq: Every day | OTIC | 0 refills | Status: AC

## 2023-05-16 MED ORDER — IOHEXOL 300 MG/ML  SOLN
75.0000 mL | Freq: Once | INTRAMUSCULAR | Status: AC | PRN
  Administered 2023-05-16: 75 mL via INTRAVENOUS

## 2023-05-16 MED ORDER — KETOROLAC TROMETHAMINE 60 MG/2ML IM SOLN
15.0000 mg | Freq: Once | INTRAMUSCULAR | Status: AC
  Administered 2023-05-16: 15 mg via INTRAMUSCULAR
  Filled 2023-05-16: qty 2

## 2023-05-16 MED ORDER — CEFUROXIME AXETIL 250 MG PO TABS
250.0000 mg | ORAL_TABLET | Freq: Two times a day (BID) | ORAL | Status: DC
  Administered 2023-05-16: 250 mg via ORAL
  Filled 2023-05-16: qty 1

## 2023-05-16 NOTE — ED Notes (Signed)
 Patient is being discharged from the Urgent Care and sent to the Emergency Department via POV . Per Mar Semen, NP, patient is in need of higher level of care due to need for imaging r/t mastoiditis. Patient is aware and verbalizes understanding of plan of care.  Vitals:   05/16/23 0841  BP: (!) 130/91  Pulse: 93  Resp: 18  Temp: 98.1 F (36.7 C)  SpO2: 97%

## 2023-05-16 NOTE — ED Provider Notes (Signed)
 [facility]    CSN: [id number] Arrival date & time: 05/16/23  4098      History   Chief Complaint Chief Complaint  Patient presents with   Otalgia    HPI Sarah Shah is a 23 y.o. female.   Sarah Shah is a 23 year old female who presents with persistent right ear pain. She was last seen on 05/10/2023 and diagnosed with acute otitis media and mastoiditis on the right side. At that visit, she received a Decadron  injection and was started on Augmentin  twice daily for 10 days and prednisone  20 mg twice daily for 4 days. She has also been taking Tylenol  as needed. She completed the course of steroids and is currently on day 6 of antibiotics. She reports full compliance with the prescribed regimen.  Despite this, she states that her symptoms have not improved and may have worsened. She describes a sensation of "air trapped in the eardrum" with pounding pressure and mild soreness to touch behind the ear. She also reports a slight decrease in hearing on the affected side. She denies any drainage from the ear, tinnitus, dizziness, headache, fevers, neck pain, or stiffness.  On evaluation, the patient is alert, oriented, afebrile, and appears nontoxic. Physical exam findings are noted above. Given the persistence and possible worsening of symptoms despite appropriate outpatient treatment, further evaluation is necessary. The patient is advised to proceed to the emergency department for imaging to assess for progression or complications related to mastoiditis. She is alert, demonstrates good insight and decision-making capacity, and is agreeable to the plan. She is stable for transport and will drive herself to the ED.        Past Medical History:  Diagnosis Date   ADHD (attention deficit hyperactivity disorder)    Anemia    Anxiety    Autism spectrum    Constipated    Constipation    Depression    Development delay    GERD (gastroesophageal reflux disease)     Headache    Insulin resistance    Iron deficiency    Learning disability    Obesity    ODD (oppositional defiant disorder)    PCOS (polycystic ovarian syndrome)    PMDD (premenstrual dysphoric disorder)    Pneumonia    3 mos old and 57 mos old   Visual acuity reduced    glasses    Patient Active Problem List   Diagnosis Date Noted   Excessive daytime sleepiness 04/20/2023   Vegetarian diet 10/15/2022   Metabolic syndrome 05/28/2022   Status post laparoscopic cholecystectomy 03/31/2022   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   Oppositional defiant disorder 02/16/2022   Generalized anxiety disorder 02/16/2022   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   Migraines 10/03/2021   Abnormal CT of the abdomen    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/2020   Iron deficiency 04/25/2020   Learning disability    GERD (gastroesophageal reflux disease) 07/20/2019   Vitamin D  deficiency 07/18/2019   Severe episode of recurrent major depressive disorder, 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/25/2019   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   PCOS (polycystic ovarian syndrome) 10/05/2016   Flat feet, bilateral 12/19/2015   Insulin resistance 12/03/2015   Class 3 severe obesity with serious comorbidity and body mass index (BMI) of 50.0 to 59.9 in adult 08/27/2014   Autism spectrum disorder    Delay in development 11/29/2006   Developmental delay 11/29/2006   Attention deficit hyperactivity disorder (ADHD), combined type 05/13/2006    Past Surgical History:  Procedure Laterality  Date   BIOPSY  02/17/2021   Procedure: BIOPSY;  Surgeon: Tobin Forts, MD;  Location: [facility];  Service: Endoscopy;;   CHOLECYSTECTOMY  09/17/2021   COLONOSCOPY WITH PROPOFOL  N/A 02/17/2021   Procedure: COLONOSCOPY WITH PROPOFOL ;  Surgeon: Tobin Forts, MD;  Location: [facility];  Service: Endoscopy;  Laterality: N/A;   ESOPHAGOGASTRODUODENOSCOPY (EGD) WITH PROPOFOL  N/A 02/17/2021   Procedure: ESOPHAGOGASTRODUODENOSCOPY  (EGD) WITH PROPOFOL ;  Surgeon: Tobin Forts, MD;  Location: [facility];  Service: Endoscopy;  Laterality: N/A;   TOOTH EXTRACTION N/A 01/03/2018   Procedure: SURGICAL EXTRACTION OF TEETH #1, 16, 17, 32;  Surgeon: Joseph Nickel, DMD;  Location: [facility];  Service: Oral Surgery;  Laterality: N/A;    OB History     Gravida  0   Para  0   Term  0   Preterm  0   AB  0   Living  0      SAB  0   IAB  0   Ectopic  0   Multiple  0   Live Births  0            Home Medications    Prior to Admission medications   Medication Sig Start Date End Date Taking? Authorizing Provider  acetaminophen  (TYLENOL ) 325 MG tablet Take 650 mg by mouth every 6 (six) hours as needed.    [provider]  amoxicillin -clavulanate (AUGMENTIN ) 875-125 MG tablet Take 1 tablet by mouth 2 (two) times daily for 10 days. 05/10/23 05/20/23  Buena Carmine, NP  Amphetamine  Sulfate 10 MG TABS Take 1 tablet by mouth daily as needed. 01/29/23   [provider]  amphetamine -dextroamphetamine  (ADDERALL XR) 30 MG 24 hr capsule Take 1 capsule (30 mg total) by mouth every morning. 04/30/23     busPIRone  (BUSPAR ) 30 MG tablet Take 30 mg by mouth 2 (two) times daily.    [provider]  cloNIDine  (CATAPRES ) 0.2 MG tablet Take 0.2 mg by mouth 2 (two) times daily. 06/23/22   [provider]  desogestrel -ethinyl estradiol  (VOLNEA ) 0.15-0.02/0.01 MG (21/5) tablet TAKE 1 TABLET BY MOUTH EVERY DAY. TAKE ACTIVE TABLETS ONLY, SKIP PLACEBO TABLETS. 01/17/23   Felicita Horns, FNP  docusate sodium (COLACE) 100 MG capsule Take 100 mg by mouth at bedtime.    [provider]  famotidine  (PEPCID ) 20 MG tablet Take 20 mg by mouth daily.    [provider]  ferrous sulfate 325 (65 FE) MG tablet Take 325 mg by mouth at bedtime.    [provider]  fluconazole  (DIFLUCAN ) 150 MG tablet Take 1 tablet (150 mg total) by mouth every three (3) days as needed. Repeat if needed 05/10/23    Buena Carmine, NP  omeprazole  (PRILOSEC) 40 MG capsule Take 1 capsule (40 mg total) by mouth daily. 07/15/22   Nandigam, [name] V, MD  tirzepatide  (ZEPBOUND ) 10 MG/0.5ML Pen Inject 10 mg into the skin once a week. 04/21/23   Dugal, Tabitha, FNP  venlafaxine XR (EFFEXOR-XR) 150 MG 24 hr capsule Take 150 mg by mouth daily. 07/15/22   [provider]  venlafaxine XR (EFFEXOR-XR) 75 MG 24 hr capsule Take 75 mg by mouth daily. 09/11/22   [provider]  vitamin C (ASCORBIC ACID) 500 MG tablet Take 500 mg by mouth at bedtime.    [provider]    Family History Family History  Problem Relation Age of Onset   Diabetes Mother    Hypertension Mother  Anxiety disorder Mother    Other Mother        Premenstrual dysphoic disorder   Obesity Mother    Stroke Mother    Colon polyps Mother    Irritable bowel syndrome Mother    Diabetes Father        type 1   Depression Father    Anxiety disorder Father    Obesity Father    Anxiety disorder Sister    ADD / ADHD Sister        ADD   Other Sister        Premenstrual dysphoric disorder   Atrial fibrillation Maternal Grandmother    Diabetes Maternal Grandmother    Heart failure Maternal Grandmother    Hypertension Maternal Grandfather    Iron deficiency Maternal Aunt    Breast cancer Maternal Aunt     Social History Social History   Tobacco Use   Smoking status: Never    Passive exposure: Current   Smokeless tobacco: Never  Vaping Use   Vaping status: Never Used  Substance Use Topics   Alcohol use: No   Drug use: Never     Allergies   Wound dressing adhesive and Semaglutide    Review of Systems Review of Systems  Constitutional:  Negative for chills and fever.  HENT:  Positive for ear pain and rhinorrhea (a little bit). Negative for congestion, ear discharge, hearing loss (right ear), sneezing and sore throat.   Eyes:  Negative for visual disturbance.  Musculoskeletal:  Negative for myalgias, neck  pain and neck stiffness.  Neurological:  Negative for dizziness and headaches.  All other systems reviewed and are negative.    Physical Exam Triage Vital Signs ED Triage Vitals  Encounter Vitals Group     BP 05/16/23 0841 (!) 130/91     Systolic BP Percentile --      Diastolic BP Percentile --      Pulse Rate 05/16/23 0841 93     Resp 05/16/23 0841 18     Temp 05/16/23 0841 98.1 F (36.7 C)     Temp Source 05/16/23 0841 Oral     SpO2 05/16/23 0841 97 %     Weight 05/16/23 0840 (!) 326 lb 1 oz (147.9 kg)     Height --      Head Circumference --      Peak Flow --      Pain Score 05/16/23 0840 8     Pain Loc --      Pain Education --      Exclude from Growth Chart --    No data found.  Updated Vital Signs BP (!) 130/91 (BP Location: Right Arm)   Pulse 93   Temp 98.1 F (36.7 C) (Oral)   Resp 18   Wt (!) 326 lb 1 oz (147.9 kg)   LMP  (LMP Unknown)   SpO2 97%   BMI 54.26 kg/m   Visual Acuity Right Eye Distance:   Left Eye Distance:   Bilateral Distance:    Right Eye Near:   Left Eye Near:    Bilateral Near:     Physical Exam Vitals reviewed.  Constitutional:      General: She is awake. She is not in acute distress.    Appearance: Normal appearance. She is well-developed. She is morbidly obese. She is not ill-appearing, toxic-appearing or diaphoretic.  HENT:     Head: Normocephalic.     Right Ear: Hearing normal. No decreased hearing noted. Drainage, swelling and  tenderness present. There is mastoid tenderness (Mild tenderness of the right mastoid noted on palpation, without associated erythema, swelling, or fluctuance). Tympanic membrane is bulging.     Left Ear: Hearing normal.     Nose: Nose normal.     Mouth/Throat:     Lips: Pink.     Mouth: Mucous membranes are moist.  Eyes:     Conjunctiva/sclera: Conjunctivae normal.  Cardiovascular:     Rate and Rhythm: Normal rate and regular rhythm.     Heart sounds: Normal heart sounds.  Pulmonary:      Effort: Pulmonary effort is normal.     Breath sounds: Normal breath sounds.  Musculoskeletal:        General: Normal range of motion.  Skin:    General: Skin is warm and dry.  Neurological:     General: No focal deficit present.     Mental Status: She is alert and oriented to person, place, and time.  Psychiatric:        Attention and Perception: Attention and perception normal.        Mood and Affect: Mood normal. Affect is flat.        Speech: Speech normal.        Behavior: Behavior is cooperative.      UC Treatments / Results  Labs (all labs ordered are listed, but only abnormal results are displayed) Labs Reviewed - No data to display  EKG   Radiology No results found.  Procedures Procedures (including critical care time)  Medications Ordered in UC Medications - No data to display  Initial Impression / Assessment and Plan / UC Course  I have reviewed the triage vital signs and the nursing notes.  Pertinent labs & imaging results that were available during my care of the patient were reviewed by me and considered in my medical decision making (see chart for details).     The patient presents with symptoms and exam findings that warrant a higher level of care and further evaluation. Given the clinical picture, emergency department (ED) assessment is recommended for advanced diagnostics and management. The patient is currently stable and appropriate for transport via private vehicle. A responsible adult will be driving. The patient is alert, oriented, demonstrates clear mental status, good insight into their illness, and sound judgment in making decisions regarding their care. The risks, rationale, and need for ED evaluation were discussed, and the patient is agreeable to the plan.  Documentation was completed with the aid of voice recognition software. Transcription may contain typographical errors. Final Clinical Impressions(s) / UC Diagnoses   Final diagnoses:   Right otitis media, unspecified otitis media type  Acute mastoiditis of right side     Discharge Instructions      You were diagnosed with mastoiditis several days ago and have been taking antibiotics and steroids as prescribed. Despite this treatment, you are still experiencing pain. Due to the persistent symptoms, you need to go to the emergency department *now* for a CT scan to further evaluate your condition and rule out complications. Do not delay, as timely imaging and care are important.      ED Prescriptions   None    PDMP not reviewed this encounter.   Beola Brazil [place], [place] 05/16/23 [phone]

## 2023-05-16 NOTE — ED Notes (Signed)
 Dc for Tiffany Dean,RN

## 2023-05-16 NOTE — ED Triage Notes (Signed)
 Pt requesting CT scan of head "to figure out what's going on w my ear." Ear infection since 4/25. Tylenol  "around the clock, taking all the abx." Pressure, muffled hearing in R ear.

## 2023-05-16 NOTE — ED Triage Notes (Signed)
 Pt presents with otalgia that we've previously treated. Pt says the pain stopped when she was taking the medicine but when she stopped the pain came back. Pt has tried OTC ear drops and peroxide.

## 2023-05-16 NOTE — ED Provider Notes (Signed)
 [place] EMERGENCY DEPARTMENT AT [facility] Provider Note   CSN: [id number] Arrival date & time: 05/16/23  1429     History  Chief Complaint  Patient presents with   Otalgia    Sarah Shah is a 23 y.o. female with PMH as listed below who presents with R ear pain. Was seen at urgent care today and sent to ED for CT. Reports persistent right ear pain. She was last seen on 05/10/2023 and diagnosed with acute otitis media and mastoiditis on the right side. At that visit, she received a Decadron  injection and was started on Augmentin  twice daily for 10 days and prednisone  20 mg twice daily for 4 days. She has also been taking Tylenol  as needed which isn't helping much. She completed the course of steroids and is currently on day 6 of augmentin . She reports full compliance with the prescribed regimen. Despite this, she states that her symptoms have not improved and may have worsened. She describes a sensation of "air trapped in the eardrum" with pounding pressure and mild soreness to touch behind the ear. She also reports a slight decrease in hearing on the affected side. She denies any drainage from the ear, tinnitus, dizziness, headache, fevers, neck pain, or stiffness..    Past Medical History:  Diagnosis Date   ADHD (attention deficit hyperactivity disorder)    Anemia    Anxiety    Autism spectrum    Constipated    Constipation    Depression    Development delay    GERD (gastroesophageal reflux disease)    Headache    Insulin resistance    Iron deficiency    Learning disability    Obesity    ODD (oppositional defiant disorder)    PCOS (polycystic ovarian syndrome)    PMDD (premenstrual dysphoric disorder)    Pneumonia    3 mos old and 42 mos old   Visual acuity reduced    glasses       Home Medications Prior to Admission medications   Medication Sig Start Date End Date Taking? Authorizing Provider  acetaminophen  (TYLENOL ) 325 MG tablet Take 650 mg by mouth  every 6 (six) hours as needed.    [provider]  amoxicillin -clavulanate (AUGMENTIN ) 875-125 MG tablet Take 1 tablet by mouth 2 (two) times daily for 10 days. 05/10/23 05/20/23  Buena Carmine, NP  Amphetamine  Sulfate 10 MG TABS Take 1 tablet by mouth daily as needed. 01/29/23   [provider]  amphetamine -dextroamphetamine  (ADDERALL XR) 30 MG 24 hr capsule Take 1 capsule (30 mg total) by mouth every morning. 04/30/23     busPIRone  (BUSPAR ) 30 MG tablet Take 30 mg by mouth 2 (two) times daily.    [provider]  cloNIDine  (CATAPRES ) 0.2 MG tablet Take 0.2 mg by mouth 2 (two) times daily. 06/23/22   [provider]  desogestrel -ethinyl estradiol  (VOLNEA ) 0.15-0.02/0.01 MG (21/5) tablet TAKE 1 TABLET BY MOUTH EVERY DAY. TAKE ACTIVE TABLETS ONLY, SKIP PLACEBO TABLETS. 01/17/23   Felicita Horns, FNP  docusate sodium (COLACE) 100 MG capsule Take 100 mg by mouth at bedtime.    [provider]  famotidine  (PEPCID ) 20 MG tablet Take 20 mg by mouth daily.    [provider]  ferrous sulfate 325 (65 FE) MG tablet Take 325 mg by mouth at bedtime.    [provider]  fluconazole  (DIFLUCAN ) 150 MG tablet Take 1 tablet (150 mg total) by mouth every three (3) days as needed. Repeat  if needed 05/10/23   Buena Carmine, NP  omeprazole  (PRILOSEC) 40 MG capsule Take 1 capsule (40 mg total) by mouth daily. 07/15/22   Nandigam, [name] V, MD  tirzepatide  (ZEPBOUND ) 10 MG/0.5ML Pen Inject 10 mg into the skin once a week. 04/21/23   Dugal, Tabitha, FNP  venlafaxine XR (EFFEXOR-XR) 150 MG 24 hr capsule Take 150 mg by mouth daily. 07/15/22   [provider]  venlafaxine XR (EFFEXOR-XR) 75 MG 24 hr capsule Take 75 mg by mouth daily. 09/11/22   [provider]  vitamin C (ASCORBIC ACID) 500 MG tablet Take 500 mg by mouth at bedtime.    [provider]      Allergies    Wound dressing adhesive and Semaglutide     Review of Systems    Review of Systems A 10 point review of systems was performed and is negative unless otherwise reported in HPI.  Physical Exam Updated Vital Signs BP (!) 139/106 (BP Location: Right Wrist)   Pulse (!) 102   Temp 97.8 F (36.6 C) (Temporal)   Resp 18   Ht 5\' 5"  (1.651 m)   Wt (!) 145.2 kg   LMP  (LMP Unknown)   SpO2 100%   BMI 53.25 kg/m  Physical Exam General: Normal appearing female, lying in bed.  HEENT: PERRLA, Sclera anicteric, MMM, trachea midline.  Left ear: Normal appearing external auditory meatus with normal ear canal, normal TM, normal cone of light. Right ear: Normal-appearing external auditory meatus.  Erythematous, indurated ear canal.  Cannot see TM but there is an opening in the ear canal.  No purulent drainage.  Mild tenderness with patient with manipulation of the pinna.  No erythema, induration of the mastoid. Cardiology: RRR.  Resp: Normal respiratory rate and effort.  Abd: Soft, non-tender, non-distended. No rebound tenderness or guarding.  GU: Deferred. MSK: No peripheral edema or signs of trauma.  Skin: warm, dry.  Neuro: A&Ox4, CNs II-XII grossly intact. MAEs. Sensation grossly intact.  Psych: Normal mood and affect.   ED Results / Procedures / Treatments   Labs (all labs ordered are listed, but only abnormal results are displayed) Labs Reviewed  CBC - Abnormal; Notable for the following components:      Result Value   WBC 10.9 (*)    RBC 5.31 (*)    Hemoglobin 15.3 (*)    All other components within normal limits  BASIC METABOLIC PANEL WITH GFR  HCG, QUANTITATIVE, PREGNANCY    EKG None  Radiology CT Temporal Bones W Contrast Result Date: 05/16/2023 CLINICAL DATA:  Mastoiditis. EXAM: CT TEMPORAL BONES WITH CONTRAST TECHNIQUE: Axial and coronal plane CT imaging of the petrous temporal bones was performed with thin-collimation image reconstruction following intravenous contrast administration. Multiplanar CT image reconstructions were also  generated. RADIATION DOSE REDUCTION: This exam was performed according to the departmental dose-optimization program which includes automated exposure control, adjustment of the mA and/or kV according to patient size and/or use of iterative reconstruction technique. CONTRAST:  75mL OMNIPAQUE  IOHEXOL  300 MG/ML  SOLN COMPARISON:  None Available. FINDINGS: RIGHT TEMPORAL BONE External auditory canal: Moderate inflammatory changes and asymmetric enhancement are noted in the right external auditory canal. Enhancement is most evident in the extraosseous canal. Soft tissue thickening extends into the intra osseous canal. No mass lesion or bony erosion is present. Middle ear cavity: Fluid or soft tissue is present in the right middle ear cavity, surrounding the middle ear ossicles. The ossicles are intact. Fluid or soft tissue extends  into the epitympanum. Prussak's space is opacified. The scutum is intact. Tegmen tympani is intact. Inner ear structures: The cochlea, vestibule and semicircular canals are normal. The vestibular aqueduct is not enlarged. Internal auditory and facial nerve canals:  Normal Mastoid air cells: Normally aerated. No osseous erosion. LEFT TEMPORAL BONE External auditory canal: Normal Middle ear cavity: Normally aerated. The scutum and ossicles are normal. The tegmen tympani is intact. Inner ear structures: The cochlea, vestibule and semicircular canals are normal. The vestibular aqueduct is not enlarged. Internal auditory and facial nerve canals:  Normal. Mastoid air cells: Normally aerated. No osseous erosion. Vascular: Normal non-contrast appearance of the carotid canals, jugular bulbs and sigmoid plates. Limited intracranial:  Within normal limits. Visible orbits/paranasal sinuses: The globes and orbits are within normal limits. The paranasal sinuses and mastoid air cells are clear. Soft tissues: Multiple reactive type lymph nodes are present in and about the right parotid gland. The extracranial  soft tissues are otherwise within normal limits. IMPRESSION: 1. Moderate inflammatory changes and asymmetric enhancement in the right external auditory canal compatible with otitis externa. 2. Fluid or soft tissue in the right middle ear cavity, surrounding the middle ear ossicles. The ossicles are intact. Findings are compatible with otitis media. 3. Multiple reactive type lymph nodes in and about the right parotid gland. 4. Normal left temporal bone. Electronically Signed   By: Audree Leas M.D.   On: 05/16/2023 16:59    Procedures Procedures    Medications Ordered in ED Medications  iohexol  (OMNIPAQUE ) 300 MG/ML solution 75 mL (75 mLs Intravenous Contrast Given 05/16/23 1602)    ED Course/ Medical Decision Making/ A&P                          Medical Decision Making Amount and/or Complexity of Data Reviewed Labs:  Decision-making details documented in ED Course. Radiology:  Decision-making details documented in ED Course.  Risk Prescription drug management.    MDM:    Patient had previously been diagnosed with right acute otitis media as well as concern for right mastoiditis and was treated with dexamethasone  as well as Augmentin .  Her symptoms have worsened.  CT mastoid ordered from triage does not demonstrate any mastoiditis but rather acute otitis media combined with acute otitis externa, which is consistent with patient's physical exam.  Cannot identify if the TM is ruptured however given that patient still feels pressure in the middle ear that has not released I suspect that the TM is intact.  Does have an opening in the ear canal, no need for an ear wick.  Will change antibiotics to Ceftin  and will add ofloxacin drops.  Will give Toradol  here and instructed alternate Tylenol  ibuprofen  at home.  Instructed to follow-up with PCP.  Given discharge instructions and return precautions, all questions answered to patient satisfaction.  Clinical Course as of 05/16/23 1722  Sun  May 16, 2023  1647 WBC(!): 10.9 Mild leukocytosis [HN]  1647 HCG neg, BMP unremarkable. [HN]  1710 CT Temporal Bones W Contrast 1. Moderate inflammatory changes and asymmetric enhancement in the right external auditory canal compatible with otitis externa. 2. Fluid or soft tissue in the right middle ear cavity, surrounding the middle ear ossicles. The ossicles are intact. Findings are compatible with otitis media. 3. Multiple reactive type lymph nodes in and about the right parotid gland. 4. Normal left temporal bone.   [HN]  1721 No mastoiditis per imaging [HN]    Clinical Course  User Index [HN] Merdis Stalling, MD    Labs: Ordered from triage, and I personally interpreted labs.  The pertinent results include: Those listed above  Imaging Studies ordered: CT temporal bones with contrast ordered from triage I independently visualized and interpreted imaging. I agree with the radiologist interpretation  Additional history obtained from chart review, mother at bedside  Social Determinants of Health:  lives independently, in college  Disposition:  DC w/ discharge instructions/return precautions. All questions answered to patient's satisfaction.    Co morbidities that complicate the patient evaluation  Past Medical History:  Diagnosis Date   ADHD (attention deficit hyperactivity disorder)    Anemia    Anxiety    Autism spectrum    Constipated    Constipation    Depression    Development delay    GERD (gastroesophageal reflux disease)    Headache    Insulin resistance    Iron deficiency    Learning disability    Obesity    ODD (oppositional defiant disorder)    PCOS (polycystic ovarian syndrome)    PMDD (premenstrual dysphoric disorder)    Pneumonia    3 mos old and 65 mos old   Visual acuity reduced    glasses     Medicines Meds ordered this encounter  Medications   iohexol  (OMNIPAQUE ) 300 MG/ML solution 75 mL    I have reviewed the patients home medicines  and have made adjustments as needed  Problem List / ED Course: Problem List Items Addressed This Visit   None Visit Diagnoses       Acute suppurative otitis media of right ear without spontaneous rupture of tympanic membrane, recurrence not specified    -  Primary     Acute otitis externa of right ear, unspecified type                       This note was created using dictation software, which may contain spelling or grammatical errors.    Merdis Stalling, MD 05/16/23 [phone]

## 2023-05-16 NOTE — Discharge Instructions (Addendum)
 Thank you for coming to [facility] Emergency Department. You were seen for ear pain. We did an exam, labs, and imaging, and these showed an infection in the middle ear as well as the outer ear canal. Please take: -Ceftin  250 mg twice per day for 7 days -Ofloxacin 10 drops into right ear once per day for 7 days You can alternate taking Tylenol  and ibuprofen  as needed for pain. You can take 650mg  tylenol  (acetaminophen ) every 4-6 hours, and 600 mg ibuprofen  3 times a day.  Please follow up with your primary care provider within 1 week.   Do not hesitate to return to the ED or call 911 if you experience: -Worsening symptoms -Lightheadedness, passing out -Fevers/chills -Anything else that concerns you

## 2023-05-16 NOTE — Discharge Instructions (Addendum)
 You were diagnosed with mastoiditis several days ago and have been taking antibiotics and steroids as prescribed. Despite this treatment, you are still experiencing pain. Due to the persistent symptoms, you need to go to the emergency department *now* for a CT scan to further evaluate your condition and rule out complications. Do not delay, as timely imaging and care are important.

## 2023-05-17 IMAGING — CT CT ABD-PELV W/ CM
2 of 4 series · 16 of 46 positions shown, 18 images · IV contrast (APPLIED)
Comparison: CT abdomen and pelvis 12/10/2020. CT abdomen and pelvis
04/24/2017.

CLINICAL DATA: Acute abdominal pain, right flank pain.

EXAM:
CT ABDOMEN AND PELVIS WITH CONTRAST
TECHNIQUE: Multidetector CT imaging of the abdomen and pelvis was performed
using the standard protocol following bolus administration of
intravenous contrast.
CONTRAST:  100mL OMNIPAQUE IOHEXOL 300 MG/ML  SOLN

[Series 2: abd pel w · axial · 0.98mm/px · z∈[+578,+1073]mm · 13 of 109 slices shown, 15 images]
[im 5/109  soft-tissue]
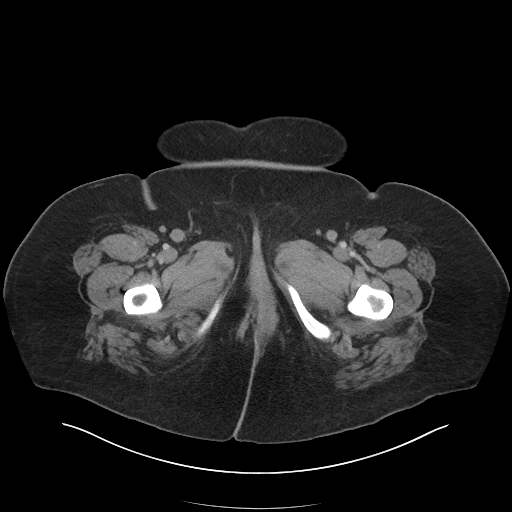
[im 5/109  bone]
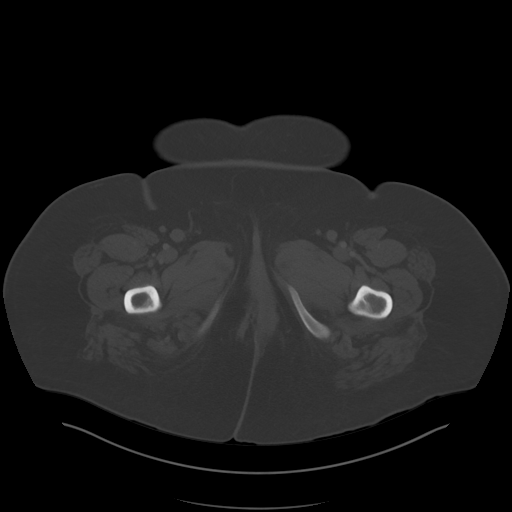
[im 14/109  soft-tissue]
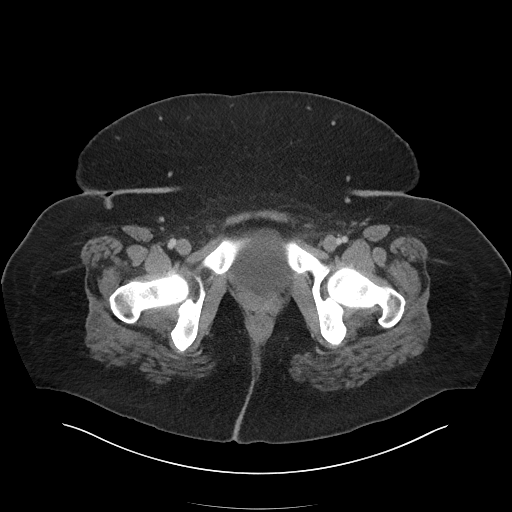
[im 23/109  soft-tissue]
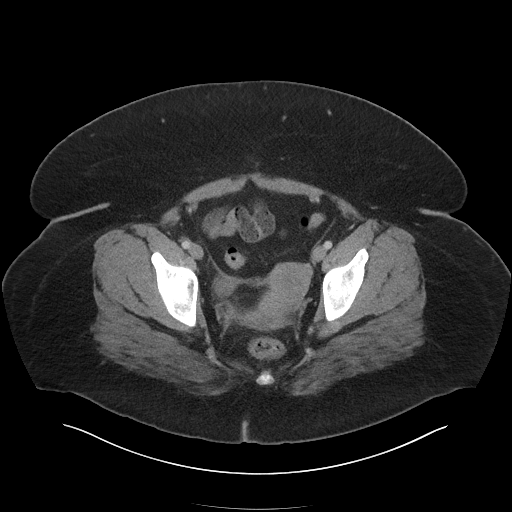
[im 32/109  soft-tissue]
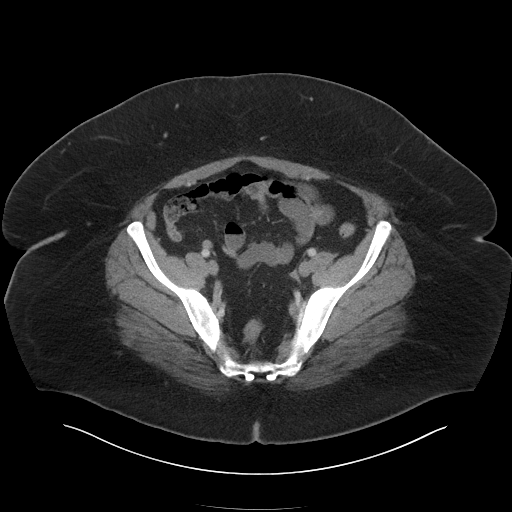
[im 37/109  soft-tissue]
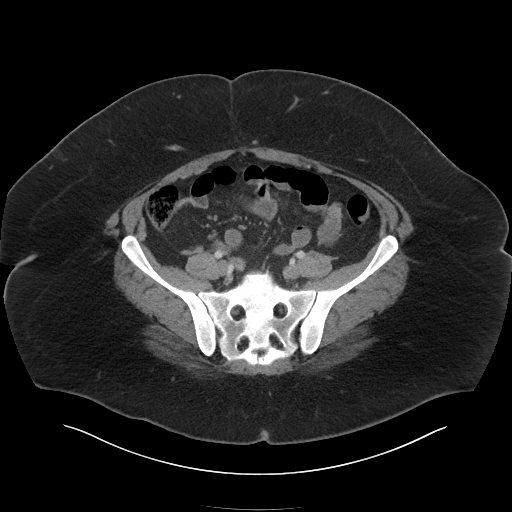
[im 46/109  soft-tissue]
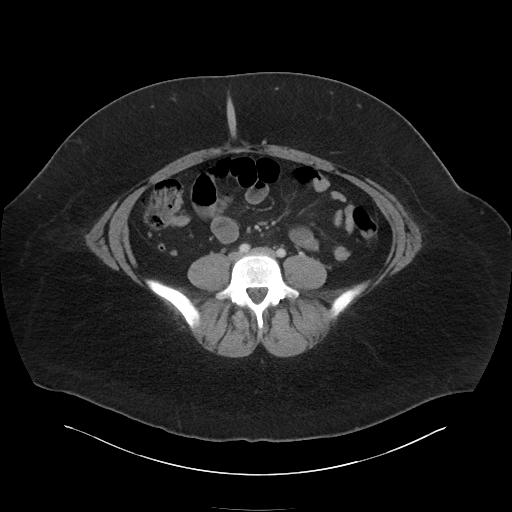
[im 55/109  soft-tissue]
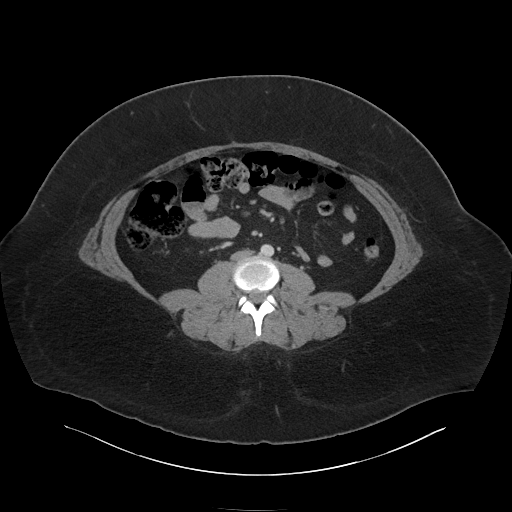
[im 64/109  soft-tissue]
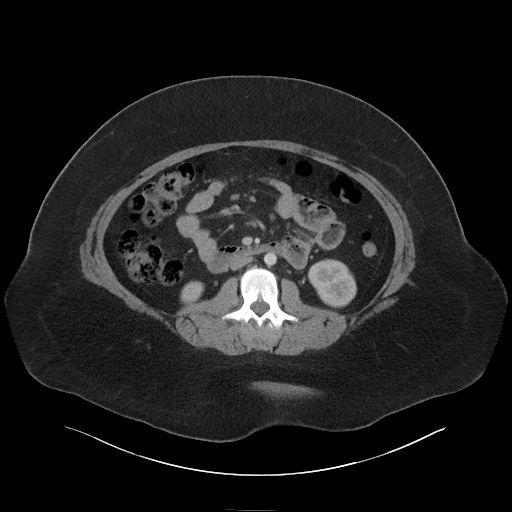
[im 73/109  soft-tissue]
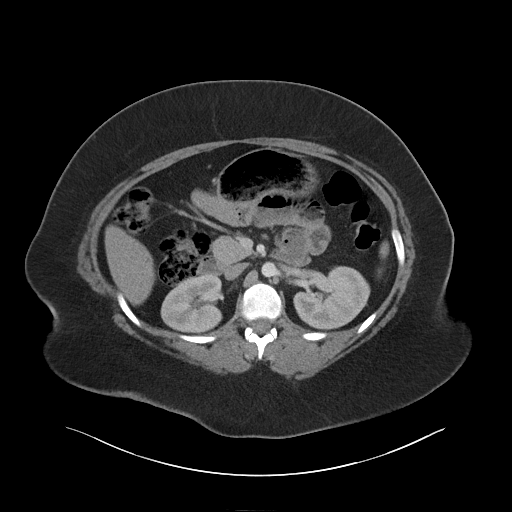
[im 73/109  bone]
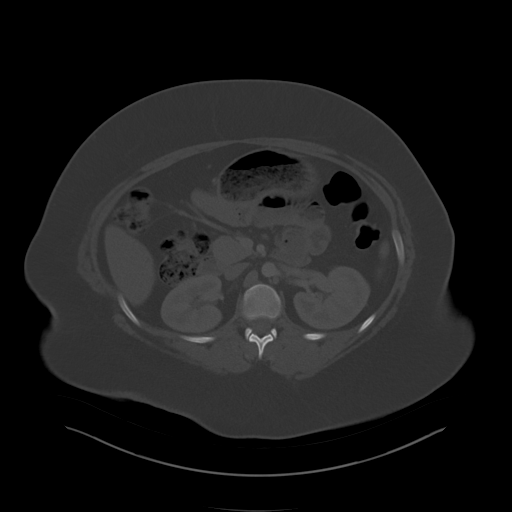
[im 77/109  soft-tissue]
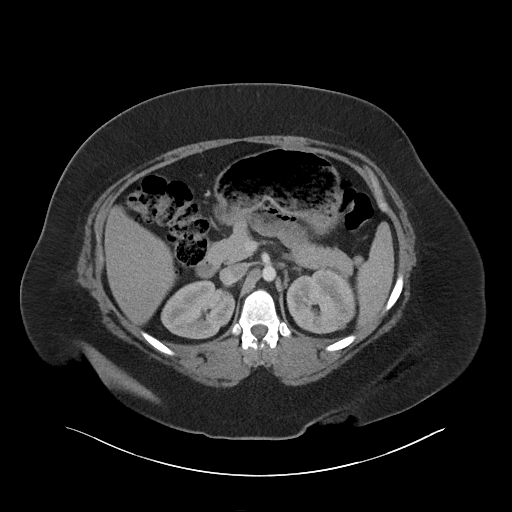
[im 86/109  soft-tissue]
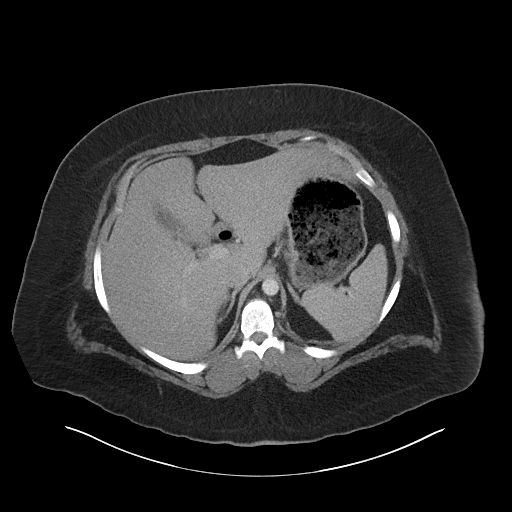
[im 95/109  soft-tissue]
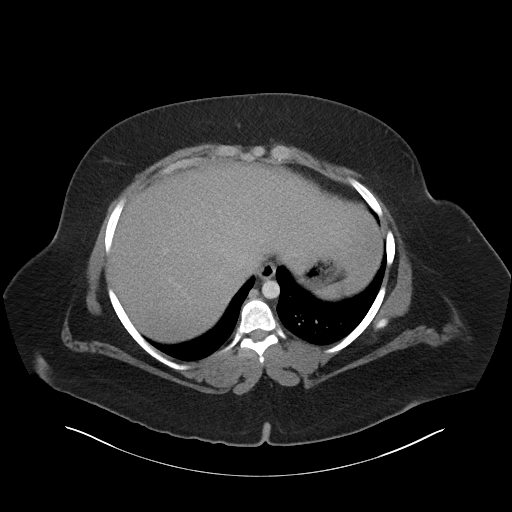
[im 104/109  soft-tissue]
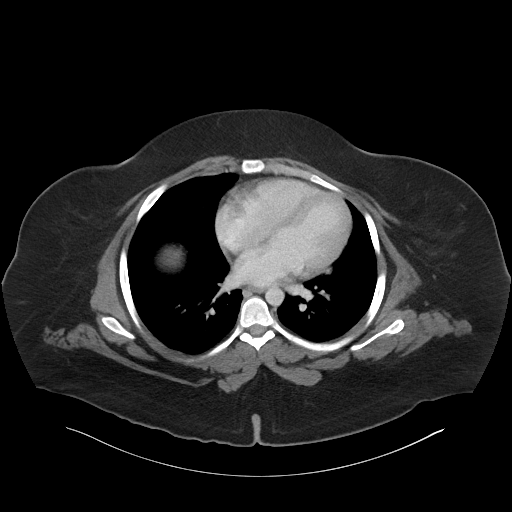

[Series 5: coronal · coronal · 0.99mm/px · 3 of 117 slices shown]
[im 39/117  soft-tissue]
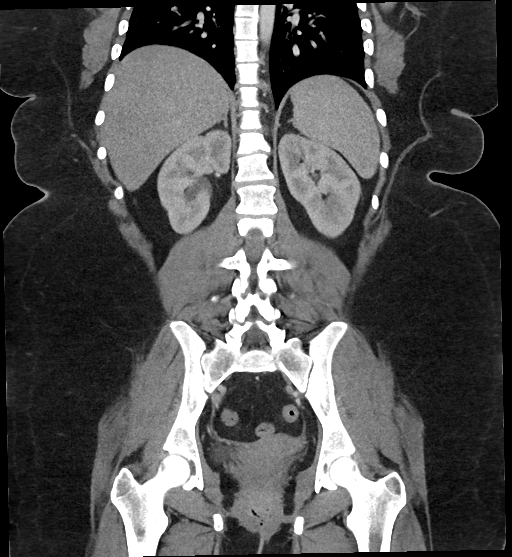
[im 52/117  soft-tissue]
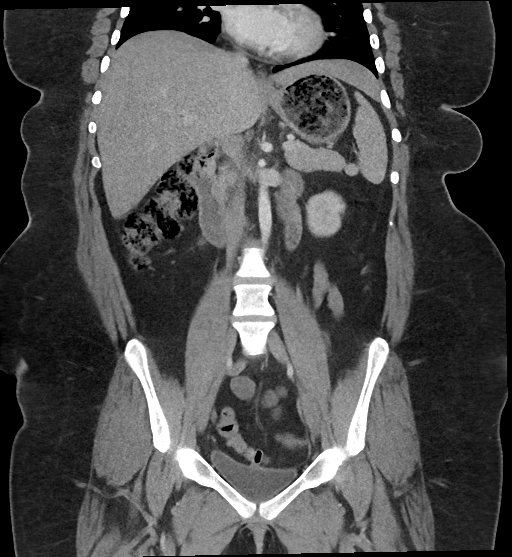
[im 65/117  soft-tissue]
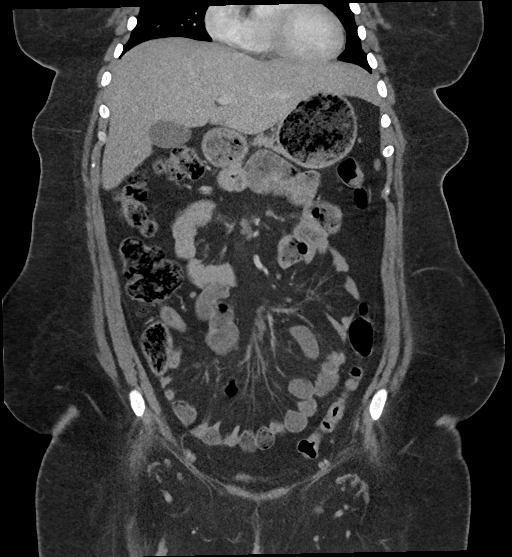

[16 of 46 positions shown; findings below may reference images not displayed]

FINDINGS: Lower chest: There is a stable 3 mm ground-glass nodule in the right
lower lobe unchanged from 4350 and favored as benign. No acute
abnormality.

Hepatobiliary: No focal liver abnormality is seen. No gallstones,
gallbladder wall thickening, or biliary dilatation. Small amount of
focal fat along the falciform ligament is unchanged.

Pancreas: Unremarkable. No pancreatic ductal dilatation or
surrounding inflammatory changes.

Spleen: Normal in size without focal abnormality.

Adrenals/Urinary Tract: Adrenal glands are unremarkable. Kidneys are
normal, without renal calculi, focal lesion, or hydronephrosis.
Bladder is unremarkable.

Stomach/Bowel: Stomach is within normal limits. Appendix appears
normal. No evidence of bowel wall thickening, distention, or
inflammatory changes. Sigmoid colon diverticulosis.

Vascular/Lymphatic: Again seen are small central and right lower
quadrant mesenteric lymph nodes similar to the prior study. Aorta
and IVC are within normal limits.

Reproductive: Uterus and bilateral adnexa are unremarkable.

Other: No abdominal wall hernia or abnormality. No abdominopelvic
ascites.

Musculoskeletal: Chronic T11 inferior endplate sclerosis is
unchanged. No acute bony abnormality.
IMPRESSION: 1. Stable mesenteric and right lower quadrant lymph nodes may
represent mesenteric adenitis.
2. Sigmoid colon diverticulosis.

## 2023-05-18 ENCOUNTER — Encounter: Payer: Self-pay | Admitting: Clinical

## 2023-05-18 ENCOUNTER — Ambulatory Visit (INDEPENDENT_AMBULATORY_CARE_PROVIDER_SITE_OTHER): Admitting: Clinical

## 2023-05-18 DIAGNOSIS — F419 Anxiety disorder, unspecified: Secondary | ICD-10-CM

## 2023-05-18 DIAGNOSIS — F32A Depression, unspecified: Secondary | ICD-10-CM

## 2023-05-18 DIAGNOSIS — F840 Autistic disorder: Secondary | ICD-10-CM

## 2023-05-18 NOTE — Progress Notes (Signed)
 [facility] Counselor/Therapist Progress Note  Patient ID: Sarah Shah, MRN: [id number]    Date: 05/18/23  Time Spent: 1:59 pm - 2:46 pm: 47 Minutes  Type of Service Provided Individual Therapy  Type of Contact virtual (via Careg[employer] with real time audio and visual interaction)  Patient Location: home       Provider Location: office  Dwyane Glad participated from home, via video, and consented to treatment. Therapist participated from office.  Patient consented to telehealth therapy and is aware and consented to the limitations of telehealth.   Mental Status Exam: Appearance:  Casual somewhat disheveled   Behavior: Appropriate and Sharing  Motor: Normal  Speech/Language:  Clear and Coherent and Normal Rate  Affect: Appropriate  Mood: normal  Thought process: normal  Thought content:   WNL  Sensory/Perceptual disturbances:   WNL  Orientation: oriented to person, place, time/date, and situation  Attention: Good  Concentration: Good  Memory: WNL  Fund of knowledge:  Fair  Insight:   Fair  Judgment:  Fair  Impulse Control: Good   Risk Assessment:  Danger to Self:  No - denied SI Self-injurious Behavior: No - denied SIB since last visit Danger to Others: No Duty to Warn:no  Presenting Problems, Reported Symptoms, and /or Interim History: Eshana presented for a session to address anxiety and independence.   Clestine and therapist have periodically updated/reviewed the goals but during today's session, formally reviewed goals and Azalie was sent the treatment plan form to sign. Brittant indicated that she has made some progress on her goals, but felt that she had not reached her long term goals yet, so wanted to keep the goals active.   As part of the goal review, updated information was gathered. Sereniti indicated that she in not on new medications (other than medications for her current illness) and has not been provided new diagnoses. She did not report  changes to her living situation. 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  Please see her initial intake with this clinician and the medical record for more information.   Subjective: Sandralee presented for an individual outpatient therapy session. The following was addressed during sessions.   Deni reported that her overall mood is anxious. Rykia rated her anxiety as a 8 out of 10 (with 10 being high) due to being in the midst of finals.  She indicated that she took one of her finals yesterday and felt that she probably did okay but also was worried that she would lose points for spelling.  Her next final was the day after the therapy session.  Anxiety management strategies were discussed with Wander identifying several strategies she could use. Astoria reported that she has shown improvement in her spending as she has been sticking to her monthly allowance and not spending money on unnecessary things.  She reported that she is motivated to try to get her license and purchased a car as a step towards increasing her autonomy and independence.  Some social challenges that she has been having were discussed as well as what she has learned from some of these experiences and how to potentially move forward with her goal of expanding her social circle. Marialena also discussed the possibility of applying for a job in the future.  Interventions/Psychotherapy Techniques Used During Session: Cognitive Behavioral Therapy, daily life skills and executive functioning skill supports  Diagnosis: Anxiety  Depression, unspecified depression type  Autism spectrum disorder  MENTAL HEALTH INTERVENTIONS USED  DURING TREATMENT & PATIENT'S RESPONSE TO INTERVENTIONS:  Short-term Objective addressed today:Caryn will engage in regular appropriate relaxation and coping activities to try to enhance her overall coping resources Mental health techniques used: Objective was addressed in session through the use  of  Cognitive Behavioral Therapy and discussion. Herbert's response was positive.  Progress Toward Goal: some progress  Short-term Objective addressed today:Ettel will understand her money spending and saving priorities  Mental health techniques used: Objective was addressed in session through the use of daily life skills and executive functioning skill supports, and discussion. Dominiqua's response was positive. Progress Toward Goal: some progress    PLAN  1. [name] will return for a therapy session.   2. Homework Given: Use her anxiety management strategies to get through finals, start studying for her driving test, continue to moderate her spending. This homework will be reviewed with Sherline Distel at the next visit.  3. During the next session check-in on mood, anxiety, long-term goals.     Donneta Gaines, [name]  Individual Treatment Plan  - please see the notes from 04/17/2021 & 05/01/2021 for the initial treatment plan, the updates in Winter 2024, and the goal review from 06/02/2022 for complete treatment plan information.  The below goals were reviewed and updated on 05/18/2023   Updated goals as of May 2025 Problem/Need: Lynnix has a history of anxiety and mood concerns - with some ongoing challenges in these areas  Long-Term Goal #1: Norris will be able to identify and address any mood or anxiety challenges and will maintain a low level of anxiety and depression symptoms as evidenced by self-report.  Prior Short-Term Objectives: Objective 1A: Dejanae will be able to rate her mood and anxiety - GOAL MET  Objective 1B: Shareena will be able to identify any negative thinking patterns that are perpetuating anxiety or depressive symptoms and replace these with more adaptive thoughts. - GOAL Partially met  Objective 1C: Sharia will be able to use appropriate coping strategies including helpful thoughts to maintain a low level of anxiety and depressive symptoms.   Updated Short-Term Objectives as of 2025: Objective 1A:  Ailah will be able to identify any negative thinking patterns that are perpetuating anxiety or depressive symptoms and replace these with more adaptive thoughts. - GOAL Partially met  Objective 1B: Loganne will engage in behavioral activation strategies to decrease symptoms of depression Objective 1C: Shy will engage in regular appropriate relaxation and coping activities to try to enhance her overall coping resources Objective 1D: Aylee will be able to appropriately manage periods of depressive symptoms and anxiety without using negative coping strategies such as self-injurious behaviors Objective 1E: Ressie will be able to use appropriate coping strategies including helpful thoughts to maintain a low level of anxiety and depressive symptoms. Interventions: Cognitive Behavioral Therapy, Psychologist, occupational, Motivational Interviewing, and Psycho-education/Bibliotherapy  coping skills and other evidenced-based practices will be used to promote progress towards healthy functioning and to help manage decrease symptoms associated with their diagnosis.  Treatment Regimen: Individual skill building sessions every 2-4 weeks to address treatment goal/objective Target Date: 05/2024 Responsible Party: therapist and patient Person delivering treatment: Licensed Psychologist Donneta Gaines, [name] will support the patient's ability to achieve the goals identified. Resolved:  No -as of May 2025, Dazja has continued to make progress with managing her anxiety and depression, but has periodically experienced increases in her symptoms of depression as well as anxiety and inconsistently uses her coping resources when experiencing these increases -therefore, this goal is continued  Problem/Need: Life transition - Normagene is in the process of completing a major life transition including graduating from high school, starting college, and potentially working. - As of 06/02/2022 Gudalupe has graduated from high school and successfully  completed 2 semesters of college. However she continues to have life transitions, so the problem/need section has been updated - As of 05/18/2023 Zema has continued to work towards completing her life transtions   Updated Problem/Need: Life transition - Adylee has continuing life transitions including working towards completing her college program and getting a job.  Long-Term Goal #2: Naziyah will successfully navigate these life transitions including being able to manage her stress level.  Prior Short-Term Objectives: Objective 2A: Samariyah will be able to identify stressors associated with the life transitions - GOAL MET  - stressors include time management, test taking, and expanding her social circle  Objective 2B: Lasonja will be able to identify strategies to help navigate stressors associated with the life transitions and implement them   Updated Short-Term Objectives as of 2025: Current stressors associated with life transitions that Amoya continues to struggle with include time management and expanding her social circle Objective 2A: Lunabella will be able to develop a flexible schedule that includes completing her school work and home tasks Objective 2B: Xenia will identify people or settings where there is the possibility of developing a friendship  Objective 2C: Nene will begin to consider employment opportunities.  Objective 2D: Alichia will complete necessary coursework Interventions: Assertiveness/Communication, Psychologist, occupational, and Psycho-education/Bibliotherapy , executive functioning skills strategies and other evidenced-based practices will be used to promote progress towards healthy functioning and to help manage decrease symptoms associated with their diagnosis.  Treatment Regimen: Individual skill building sessions every 2-4 weeks to address treatment goal/objective Target Date: 05/2024 Responsible Party: therapist and patient Person delivering treatment: Licensed Psychologist  Donneta Gaines, [name] will support the patient's ability to achieve the goals identified. Resolved:  No  Problem/Need: Life skills - Harleen needs to develop some additional skills to facilitate independent living. Long-Term Goal #3: Taleasha will begin to develop additional life skills necessary to facilitate independent living.   Prior Short-Term Objectives: Objective 3A: Verga will be able to identify areas that she needs to address to increase the likelihood of her being able to successfully live independently.  - GOAL MET  - areas identified so far (as of 06/02/2022) include money management, cooking, and transportation   Objective 3B: Tametha will be able to create a plan to address weaknesses described in objective 3A.   Objective 3C: Jawana will be able to implement steps on the plan developed in objective 3B.  Updated Short-Term Objectives as of 2025: areas identified include money management, cooking, and transportation  Objective 3A: Taundra will begin to study for her drivers test Objective 3B: Tenisha will understand her money spending and saving priorities  Objective 3C: Riverly will develop and stick to a budget  Interventions: Motivational Interviewing, Solution-Oriented/Positive Psychology, and Psycho-education/Bibliotherapy  daily life skills, and other evidenced-based practices will be used to promote progress towards healthy functioning and to help manage decrease symptoms associated with their diagnosis.  Treatment Regimen: Individual skill building sessions every 2-4 weeks to address treatment goal/objective Target Date: 05/2024 Responsible Party: therapist and patient Person delivering treatment: Licensed Psychologist Donneta Gaines, [name] will support the patient's ability to achieve the goals identified. Resolved:  No - Hollister has made some strides towards identifying areas that need to be addressed to facilitate independent living and made some progress on  developing those skills, but  process continues    Serena participated in treatment planning: _X_ contributed to goals and plan _X_ aware of plan content __ reviewed written plan __ refused to participate __ unable to participate because _________________________________________   Progress and treatment plan will be reviewed periodically (at least every 12 months, or sooner if needed). The treatment plan was formally reviewed with the Nene on 05/18/2023 and  the form was sent to be signed.    Donneta Gaines, [name]

## 2023-05-21 ENCOUNTER — Telehealth: Payer: Self-pay

## 2023-05-21 NOTE — Telephone Encounter (Signed)
-----   Message from [facility] sent at 05/19/2023 12:20 PM EDT ----- Regarding: FW: July 2025  ----- Message ----- From: Diantha Fossa, CMA Sent: 05/19/2023  12:00 AM EDT To: Diantha Fossa, CMA Subject: FW: July 2025                                   ----- Message ----- From: Diantha Fossa, CMA Sent: 04/13/2023  12:00 AM EDT To: Diantha Fossa, CMA Subject: July 2025                                      1 yr flup with [name] dx GERD

## 2023-05-21 NOTE — Telephone Encounter (Signed)
 Left message for patient to return call to further discuss scheduling 1 year follow up with Dr Elvin Hammer or [facility].  Will continue efforts.

## 2023-05-25 ENCOUNTER — Other Ambulatory Visit (HOSPITAL_COMMUNITY): Payer: Self-pay

## 2023-05-25 MED ORDER — AMPHETAMINE-DEXTROAMPHET ER 30 MG PO CP24
30.0000 mg | ORAL_CAPSULE | Freq: Every morning | ORAL | 0 refills | Status: DC
  Filled 2023-05-25 – 2023-05-29 (×2): qty 30, 30d supply, fill #0

## 2023-05-25 NOTE — Telephone Encounter (Signed)
 Left message for patient to return call to further discuss scheduling 1 year follow up with Dr Elvin Hammer or [facility].  Will continue efforts.

## 2023-05-29 ENCOUNTER — Other Ambulatory Visit (HOSPITAL_COMMUNITY): Payer: Self-pay

## 2023-06-01 ENCOUNTER — Ambulatory Visit (INDEPENDENT_AMBULATORY_CARE_PROVIDER_SITE_OTHER): Admitting: Clinical

## 2023-06-01 DIAGNOSIS — F419 Anxiety disorder, unspecified: Secondary | ICD-10-CM

## 2023-06-01 DIAGNOSIS — F840 Autistic disorder: Secondary | ICD-10-CM | POA: Diagnosis not present

## 2023-06-01 DIAGNOSIS — F32A Depression, unspecified: Secondary | ICD-10-CM | POA: Diagnosis not present

## 2023-06-01 NOTE — Progress Notes (Signed)
  Behavioral Health Counselor/Therapist Progress Note  Patient ID: Deannah Rossi, MRN: [id number]    Date: 06/01/23  Time Spent: 1:08 pm - 1:56 pm: 48 Minutes  Type of Service Provided Individual Therapy   Type of Contact in-person Location: office   Mental Status Exam: Appearance:  Slightly disheveled      Behavior: Appropriate  Motor: Normal  Speech/Language:  Clear and Coherent and Normal Rate  Affect: Appropriate  Mood: normal  Thought process: normal  Thought content:   WNL  Sensory/Perceptual disturbances:   WNL  Orientation: oriented to person, place, time/date, and situation  Attention: Good  Concentration: Good  Memory: WNL  Fund of knowledge:  Fair  Insight:   Fair  Judgment:  Fair  Impulse Control: Fair   Risk Assessment:  Danger to Self:  No - denied SI Self-injurious Behavior: No - denied SIB Danger to Others: No Duty to Warn:no  Presenting Problems, Reported Symptoms, and /or Interim History: Gladine presented for a session to address anxiety and mood challenges.  - Although client was scheduled for one additional session before the therapists vacation, client was informed of clinician's upcoming vacation and emergency procedures were reviewed.       Subjective: Cyra presented for an individual outpatient therapy session. The following was addressed during sessions.   Young reported that her mood, anxiety, and level of frustration was "so-so". She reported that she has not been experiencing a severe negative mood but been feeling to be in a mildly bad or negative mood. She did well in her classes but has been feeling a bit lonely in the day. Strategies to manage mood and loneliness were discussed including considering how Antanisha may want to expand her social circle. She will be seeing some friends from high school this weekend. She is also planning to go to [place]  for two weeks in the summer. Last time she was there, she struggled emotionally, so  planning for strategies to manage that were discussed.    Interventions/Psychotherapy Techniques Used During Session: Cognitive Behavioral Therapy and Social Optician, dispensing and solution focused  Diagnosis: Anxiety  Depression, unspecified depression type  Autism spectrum disorder  MENTAL HEALTH INTERVENTIONS USED DURING TREATMENT & PATIENT'S RESPONSE TO INTERVENTIONS:  Short-term Objective addressed today:Melvia will be able to identify any negative thinking patterns that are perpetuating anxiety or depressive symptoms and replace these with more adaptive thoughts. AND Kenyonna will engage in behavioral activation strategies to decrease symptoms of depression Mental health techniques used: Objective was addressed in session through the use of Cognitive Behavioral Therapy and discussion. Terea's response was positive. Progress Toward Goal: progressing  Short-term Objective addressed today:Lucianna will identify people or settings where there is the possibility of developing a friendship  Mental health techniques used: Objective was addressed in session through the use of Social Skills Training and solution focused strategies and discussion. Lianni's response was positive. Progress Toward Goal: slight progress    PLAN  1. Jamarria will return for a therapy session.   2. Homework Given:  start her class and continue with task management strategies, look for ways to decrease stressors associated with her trip, continue to engage in coping strategies to manage feelings of sadness and anxiety. This homework will be reviewed with Sherline Distel at the next visit.  3. During the next session check in on trip, anxiety, mood, and new class.     Donneta Gaines, [name]  Individual Treatment Plan  - please see the notes from 04/17/2021 & 05/01/2021 for the  initial treatment plan, the updates in Winter 2024, and the goal review from 06/02/2022, and another goal review from 05/18/2023 for complete treatment plan information.     Updated goals as of May 2025 Problem/Need: Ziomara has a history of anxiety and mood concerns - with some ongoing challenges in these areas  Long-Term Goal #1: Tiyonna will be able to identify and address any mood or anxiety challenges and will maintain a low level of anxiety and depression symptoms as evidenced by self-report.  Updated Short-Term Objectives as of 2025: Objective 1A: Carliss will be able to identify any negative thinking patterns that are perpetuating anxiety or depressive symptoms and replace these with more adaptive thoughts. - GOAL Partially met  Objective 1B: Charlita will engage in behavioral activation strategies to decrease symptoms of depression Objective 1C: Syndey will engage in regular appropriate relaxation and coping activities to try to enhance her overall coping resources Objective 1D: Willis will be able to appropriately manage periods of depressive symptoms and anxiety without using negative coping strategies such as self-injurious behaviors Objective 1E: Alyssamae will be able to use appropriate coping strategies including helpful thoughts to maintain a low level of anxiety and depressive symptoms. Interventions: Cognitive Behavioral Therapy, Psychologist, occupational, Motivational Interviewing, and Psycho-education/Bibliotherapy  coping skills and other evidenced-based practices will be used to promote progress towards healthy functioning and to help manage decrease symptoms associated with their diagnosis.  Treatment Regimen: Individual skill building sessions every 2-4 weeks to address treatment goal/objective Target Date: 05/2024 Responsible Party: therapist and patient Person delivering treatment: Licensed Psychologist Donneta Gaines, [name] will support the patient's ability to achieve the goals identified. Resolved:  No -as of May 2025, Azarria has continued to make progress with managing her anxiety and depression, but has periodically experienced increases in her symptoms of  depression as well as anxiety and inconsistently uses her coping resources when experiencing these increases -therefore, this goal is continued   Problem/Need: Life transition - Vernica is in the process of completing a major life transition including graduating from high school, starting college, and potentially working. - As of 06/02/2022 Ilean has graduated from high school and successfully completed 2 semesters of college. However she continues to have life transitions, so the problem/need section has been updated - As of 05/18/2023 Panzy has continued to work towards completing her life transitions  Updated Problem/Need: Life transition - Chania has continuing life transitions including working towards completing her college program and getting a job.  Long-Term Goal #2: Sunshine will successfully navigate these life transitions including being able to manage her stress level.  Updated Short-Term Objectives as of 2025: Current stressors associated with life transitions that Cortlynn continues to struggle with include time management and expanding her social circle Objective 2A: Trenise will be able to develop a flexible schedule that includes completing her school work and home tasks Objective 2B: Hitomi will identify people or settings where there is the possibility of developing a friendship  Objective 2C: Brendalee will begin to consider employment opportunities.  Objective 2D: Kanylah will complete necessary coursework Interventions: Assertiveness/Communication, Psychologist, occupational, and Psycho-education/Bibliotherapy , executive functioning skills strategies and other evidenced-based practices will be used to promote progress towards healthy functioning and to help manage decrease symptoms associated with their diagnosis.  Treatment Regimen: Individual skill building sessions every 2-4 weeks to address treatment goal/objective Target Date: 05/2024 Responsible Party: therapist and patient Person delivering  treatment: Licensed Psychologist Donneta Gaines, [name] will support the patient's ability to achieve the  goals identified. Resolved:  No  Problem/Need: Life skills - Deztinee needs to develop some additional skills to facilitate independent living. Long-Term Goal #3: Emmajo will begin to develop additional life skills necessary to facilitate independent living.   Updated Short-Term Objectives as of 2025: areas identified include money management, cooking, and transportation Objective 3A: Lyrika will begin to study for her drivers test Objective 3B: Amariana will understand her money spending and saving priorities  Objective 3C: Adithi will develop and stick to a budget  Interventions: Motivational Interviewing, Solution-Oriented/Positive Psychology, and Psycho-education/Bibliotherapy  daily life skills, and other evidenced-based practices will be used to promote progress towards healthy functioning and to help manage decrease symptoms associated with their diagnosis.  Treatment Regimen: Individual skill building sessions every 2-4 weeks to address treatment goal/objective Target Date: 05/2024 Responsible Party: therapist and patient Person delivering treatment: Licensed Psychologist Donneta Gaines, [name] will support the patient's ability to achieve the goals identified. Resolved:  No - Mande has made some strides towards identifying areas that need to be addressed to facilitate independent living and made some progress on developing those skills, but process continues    Donneta Gaines, [name]

## 2023-06-02 NOTE — Telephone Encounter (Signed)
 Phone call to patient to make her aware that she is due to be scheduled for a 1 year follow up, but patient does not wish to schedule at this time.  Patient stated she has a lot going on at this time and will call back to schedule an appointment.  Patient thanked me for the call.

## 2023-06-09 ENCOUNTER — Ambulatory Visit: Admitting: Family

## 2023-06-09 ENCOUNTER — Ambulatory Visit: Admitting: Registered"

## 2023-06-18 ENCOUNTER — Other Ambulatory Visit (HOSPITAL_COMMUNITY): Payer: Self-pay

## 2023-06-21 ENCOUNTER — Other Ambulatory Visit (HOSPITAL_COMMUNITY): Payer: Self-pay

## 2023-06-21 ENCOUNTER — Ambulatory Visit: Admitting: Clinical

## 2023-06-21 DIAGNOSIS — F411 Generalized anxiety disorder: Secondary | ICD-10-CM | POA: Diagnosis not present

## 2023-06-21 DIAGNOSIS — F341 Dysthymic disorder: Secondary | ICD-10-CM | POA: Diagnosis not present

## 2023-06-21 DIAGNOSIS — F900 Attention-deficit hyperactivity disorder, predominantly inattentive type: Secondary | ICD-10-CM | POA: Diagnosis not present

## 2023-06-21 MED ORDER — AMPHETAMINE-DEXTROAMPHET ER 30 MG PO CP24
30.0000 mg | ORAL_CAPSULE | Freq: Every morning | ORAL | 0 refills | Status: AC
  Filled 2023-06-21: qty 30, 30d supply, fill #0
  Filled 2023-07-01: qty 18, 18d supply, fill #0
  Filled 2023-07-01: qty 12, 12d supply, fill #0

## 2023-06-21 MED ORDER — AMPHETAMINE-DEXTROAMPHET ER 30 MG PO CP24
30.0000 mg | ORAL_CAPSULE | Freq: Every morning | ORAL | 0 refills | Status: DC
  Filled 2023-07-29: qty 30, 30d supply, fill #0

## 2023-06-22 ENCOUNTER — Other Ambulatory Visit (HOSPITAL_COMMUNITY): Payer: Self-pay

## 2023-07-01 ENCOUNTER — Other Ambulatory Visit (HOSPITAL_COMMUNITY): Payer: Self-pay

## 2023-07-24 ENCOUNTER — Telehealth: Admitting: Nurse Practitioner

## 2023-07-24 DIAGNOSIS — B379 Candidiasis, unspecified: Secondary | ICD-10-CM

## 2023-07-24 DIAGNOSIS — T3695XA Adverse effect of unspecified systemic antibiotic, initial encounter: Secondary | ICD-10-CM

## 2023-07-24 DIAGNOSIS — J40 Bronchitis, not specified as acute or chronic: Secondary | ICD-10-CM

## 2023-07-24 MED ORDER — FLUCONAZOLE 150 MG PO TABS: 150.0000 mg | ORAL_TABLET | Freq: Once | ORAL | 0 refills | Status: AC

## 2023-07-24 MED ORDER — AZITHROMYCIN 250 MG PO TABS: ORAL_TABLET | ORAL | 0 refills | Status: AC

## 2023-07-24 MED ORDER — PSEUDOEPH-BROMPHEN-DM 30-2-10 MG/5ML PO SYRP: 5.0000 mL | ORAL_SOLUTION | Freq: Four times a day (QID) | ORAL | 0 refills | Status: DC | PRN

## 2023-07-24 NOTE — Progress Notes (Signed)
 Virtual Visit Consent   Sarah Shah, you are scheduled for a virtual visit with a [facility] provider today. Just as with appointments in the office, your consent must be obtained to participate. Your consent will be active for this visit and any virtual visit you may have with one of our providers in the next 365 days. If you have a MyChart account, a copy of this consent can be sent to you electronically.  As this is a virtual visit, video technology does not allow for your provider to perform a traditional examination. This may limit your provider's ability to fully assess your condition. If your provider identifies any concerns that need to be evaluated in person or the need to arrange testing (such as labs, EKG, etc.), we will make arrangements to do so. Although advances in technology are sophisticated, we cannot ensure that it will always work on either your end or our end. If the connection with a video visit is poor, the visit may have to be switched to a telephone visit. With either a video or telephone visit, we are not always able to ensure that we have a secure connection.  By engaging in this virtual visit, you consent to the provision of healthcare and authorize for your insurance to be billed (if applicable) for the services provided during this visit. Depending on your insurance coverage, you may receive a charge related to this service.  I need to obtain your verbal consent now. Are you willing to proceed with your visit today? Sarah Shah has provided verbal consent on 07/24/2023 for a virtual visit (video or telephone). Sarah LELON Servant, NP  Date: 07/24/2023 9:42 AM   Virtual Visit via Video Note   I, Sarah Shah, connected with  Sarah Shah  ([id number], Apr 18, 2000) on 07/24/23 at  9:30 AM EDT by a video-enabled telemedicine application and verified that I am speaking with the correct person using two identifiers.  Location: Patient: Virtual Visit Location  Patient: Home Provider: Virtual Visit Location Provider: Home Office   I discussed the limitations of evaluation and management by telemedicine and the availability of in person appointments. The patient expressed understanding and agreed to proceed.    History of Present Illness: Sarah Shah is a 23 y.o. who identifies as a female who was assigned female at birth, and is being seen today for bronchitis.  Over the past 3 weeks Sarah Shah has been experiencing URI symptoms of sinusitis with persistent cough.Despite taking sudafed, mucinex  and delsym her symptoms have persisted. Currently she is experiencing more chest congestion and cough than sinus symptoms.  Does not endorse fever.   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   Migraines 10/03/2021   Abnormal CT of the abdomen    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/2020   Iron deficiency 04/25/2020   Learning disability    GERD (gastroesophageal reflux disease) 07/20/2019   Vitamin D  deficiency 07/18/2019   Severe episode of recurrent major depressive disorder, 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   Semaglutide  Nausea And Vomiting   Medications:  Current Outpatient Medications:    azithromycin  (ZITHROMAX ) 250 MG tablet, Take 2 tablets on day 1, then 1 tablet daily on days 2 through 5, Disp: 6 tablet, Rfl: 0   brompheniramine-pseudoephedrine-DM 30-2-10 MG/5ML syrup, Take 5 mLs by mouth 4 (four) times daily as needed., Disp: 240 mL, Rfl: 0   fluconazole  (DIFLUCAN ) 150 MG tablet, Take 1 tablet (150 mg total) by mouth once for 1 dose., Disp: 1 tablet, Rfl: 0   acetaminophen  (TYLENOL ) 325 MG tablet, Take 650 mg by mouth every 6 (six) hours as needed., Disp: , Rfl:    Amphetamine  Sulfate 10 MG TABS, Take 1 tablet by mouth daily as needed., Disp: , Rfl:    amphetamine -dextroamphetamine  (ADDERALL XR) 30 MG 24 hr capsule, Take 1 capsule (30 mg total) by mouth in the morning., Disp: 30 capsule, Rfl: 0   amphetamine -dextroamphetamine  (ADDERALL XR) 30 MG 24 hr capsule, Take 1 capsule (30 mg total) by mouth in the morning., Disp: 30 capsule, Rfl: 0   busPIRone  (BUSPAR ) 30 MG tablet, Take 30 mg by mouth 2 (two) times daily., Disp: , Rfl:    cloNIDine  (CATAPRES ) 0.2 MG tablet, Take 0.2 mg by mouth 2 (two) times daily., Disp: , Rfl:    cromolyn (OPTICROM) 4 % ophthalmic solution, SMARTSIG:1 Drop(s) In Eye(s) Twice Daily PRN, Disp: , Rfl:    desogestrel -ethinyl estradiol  (VOLNEA ) 0.15-0.02/0.01 MG (21/5) tablet, TAKE 1 TABLET BY MOUTH EVERY DAY. TAKE ACTIVE TABLETS ONLY, SKIP PLACEBO TABLETS., Disp: 90 tablet, Rfl: 0   docusate sodium (COLACE) 100 MG capsule, Take 100 mg by mouth at bedtime., Disp: , Rfl:    famotidine  (PEPCID ) 20 MG tablet, Take 20 mg by mouth daily., Disp: , Rfl:    ferrous sulfate 325 (65 FE) MG tablet, Take 325 mg by mouth at bedtime., Disp: , Rfl:    omeprazole  (PRILOSEC) 40 MG capsule, Take 1 capsule (40 mg total) by mouth daily., Disp: 90 capsule, Rfl: 4   RESTASIS 0.05 % ophthalmic emulsion, 1 drop 2 (two) times daily., Disp: , Rfl:     tirzepatide  (ZEPBOUND ) 10 MG/0.5ML Pen, Inject 10 mg into the skin once a week., Disp: 6 mL, Rfl: 0   venlafaxine XR (EFFEXOR-XR) 150 MG 24 hr capsule, Take 150 mg by mouth daily., Disp: , Rfl:    venlafaxine XR (EFFEXOR-XR) 75 MG 24 hr capsule, Take 75 mg by mouth daily., Disp: , Rfl:    vitamin C (ASCORBIC ACID) 500 MG tablet, Take 500 mg by mouth at bedtime., Disp: , Rfl:   Observations/Objective: Patient is well-developed, well-nourished in no acute distress.  Resting comfortably at home.  Head is normocephalic, atraumatic.  No labored breathing.  Speech is clear and coherent with logical content.  Patient is alert and oriented at baseline.    Assessment and Plan: 1. Bronchitis (Primary) - azithromycin  (ZITHROMAX ) 250 MG tablet; Take 2 tablets on day 1, then 1 tablet daily on days 2 through 5  Dispense: 6 tablet; Refill: 0 - brompheniramine-pseudoephedrine-DM 30-2-10 MG/5ML syrup; Take 5 mLs by mouth 4 (four) times daily as needed.  Dispense: 240 mL;  Refill: 0  2. Antibiotic-induced yeast infection - fluconazole  (DIFLUCAN ) 150 MG tablet; Take 1 tablet (150 mg total) by mouth once for 1 dose.  Dispense: 1 tablet; Refill: 0   Follow Up Instructions: I discussed the assessment and treatment plan with the patient. The patient was provided an opportunity to ask questions and all were answered. The patient agreed with the plan and demonstrated an understanding of the instructions.  A copy of instructions were sent to the patient via MyChart unless otherwise noted below.    The patient was advised to call back or seek an in-person evaluation if the symptoms worsen or if the condition fails to improve as anticipated.    Orvie Caradine W Brynna Dobos, NP

## 2023-07-24 NOTE — Patient Instructions (Signed)
 Sarah Shah, thank you for joining Haze LELON Servant, NP for today's virtual visit.  While this provider is not your primary care provider (PCP), if your PCP is located in our provider database this encounter information will be shared with them immediately following your visit.   A [place] MyChart account gives you access to today's visit and all your visits, tests, and labs performed at [facility]  click here if you don't have a [place] MyChart account or go to [email or web address]  Consent: (Patient) Sarah Shah provided verbal consent for this virtual visit at the beginning of the encounter.  Current Medications:  Current Outpatient Medications:    azithromycin  (ZITHROMAX ) 250 MG tablet, Take 2 tablets on day 1, then 1 tablet daily on days 2 through 5, Disp: 6 tablet, Rfl: 0   brompheniramine-pseudoephedrine-DM 30-2-10 MG/5ML syrup, Take 5 mLs by mouth 4 (four) times daily as needed., Disp: 240 mL, Rfl: 0   fluconazole  (DIFLUCAN ) 150 MG tablet, Take 1 tablet (150 mg total) by mouth once for 1 dose., Disp: 1 tablet, Rfl: 0   acetaminophen  (TYLENOL ) 325 MG tablet, Take 650 mg by mouth every 6 (six) hours as needed., Disp: , Rfl:    Amphetamine  Sulfate 10 MG TABS, Take 1 tablet by mouth daily as needed., Disp: , Rfl:    amphetamine -dextroamphetamine  (ADDERALL XR) 30 MG 24 hr capsule, Take 1 capsule (30 mg total) by mouth in the morning., Disp: 30 capsule, Rfl: 0   amphetamine -dextroamphetamine  (ADDERALL XR) 30 MG 24 hr capsule, Take 1 capsule (30 mg total) by mouth in the morning., Disp: 30 capsule, Rfl: 0   busPIRone  (BUSPAR ) 30 MG tablet, Take 30 mg by mouth 2 (two) times daily., Disp: , Rfl:    cloNIDine  (CATAPRES ) 0.2 MG tablet, Take 0.2 mg by mouth 2 (two) times daily., Disp: , Rfl:    cromolyn (OPTICROM) 4 % ophthalmic solution, SMARTSIG:1 Drop(s) In Eye(s) Twice Daily PRN, Disp: , Rfl:    desogestrel -ethinyl estradiol  (VOLNEA ) 0.15-0.02/0.01 MG  (21/5) tablet, TAKE 1 TABLET BY MOUTH EVERY DAY. TAKE ACTIVE TABLETS ONLY, SKIP PLACEBO TABLETS., Disp: 90 tablet, Rfl: 0   docusate sodium (COLACE) 100 MG capsule, Take 100 mg by mouth at bedtime., Disp: , Rfl:    famotidine  (PEPCID ) 20 MG tablet, Take 20 mg by mouth daily., Disp: , Rfl:    ferrous sulfate 325 (65 FE) MG tablet, Take 325 mg by mouth at bedtime., Disp: , Rfl:    omeprazole  (PRILOSEC) 40 MG capsule, Take 1 capsule (40 mg total) by mouth daily., Disp: 90 capsule, Rfl: 4   RESTASIS 0.05 % ophthalmic emulsion, 1 drop 2 (two) times daily., Disp: , Rfl:    tirzepatide  (ZEPBOUND ) 10 MG/0.5ML Pen, Inject 10 mg into the skin once a week., Disp: 6 mL, Rfl: 0   venlafaxine XR (EFFEXOR-XR) 150 MG 24 hr capsule, Take 150 mg by mouth daily., Disp: , Rfl:    venlafaxine XR (EFFEXOR-XR) 75 MG 24 hr capsule, Take 75 mg by mouth daily., Disp: , Rfl:    vitamin C (ASCORBIC ACID) 500 MG tablet, Take 500 mg by mouth at bedtime., Disp: , Rfl:    Medications ordered in this encounter:  Meds ordered this encounter  Medications   azithromycin  (ZITHROMAX ) 250 MG tablet    Sig: Take 2 tablets on day 1, then 1 tablet daily on days 2 through 5    Dispense:  6 tablet    Refill:  0    Supervising  Provider:   BLAISE ALEENE KIDD [[id number]]   brompheniramine-pseudoephedrine-DM 30-2-10 MG/5ML syrup    Sig: Take 5 mLs by mouth 4 (four) times daily as needed.    Dispense:  240 mL    Refill:  0    Supervising Provider:   BLAISE ALEENE KIDD [[id number]]   fluconazole  (DIFLUCAN ) 150 MG tablet    Sig: Take 1 tablet (150 mg total) by mouth once for 1 dose.    Dispense:  1 tablet    Refill:  0    Supervising Provider:   LAMPTEY, PHILIP O [[id number]]     *If you need refills on other medications prior to your next appointment, please contact your pharmacy*  Follow-Up: Call back or seek an in-person evaluation if the symptoms worsen or if the condition fails to improve as anticipated.  [facility] [phone]    If you have been instructed to have an in-person evaluation today at a local Urgent Care facility, please use the link below. It will take you to a list of all of our available [facility], including address, phone number and hours of operation. Please do not delay care.  [facility]  If you or a family member do not have a primary care provider, use the link below to schedule a visit and establish care. When you choose a [place] primary care physician or advanced practice provider, you gain a long-term partner in health. Find a Primary Care Provider  Learn more about [facility] in-office and virtual care options:  - Get Care Now

## 2023-07-26 ENCOUNTER — Telehealth: Payer: Self-pay

## 2023-07-26 ENCOUNTER — Other Ambulatory Visit (HOSPITAL_COMMUNITY): Payer: Self-pay

## 2023-07-26 NOTE — Telephone Encounter (Signed)
 Pharmacy Patient Advocate Encounter   Received notification from CoverMyMeds that prior authorization for Zepbound  5MG /0.5ML pen-injectors is due for renewal.   Insurance verification completed.   The patient is insured through [facility]  Action: PA required; PA started via CoverMyMeds. KEY [id number] . Please see clinical question(s) below that I am not finding the answer to in their chart and advise.

## 2023-07-26 NOTE — Telephone Encounter (Signed)
 Pt needs appt to have her refill sent for zepbound  they want an in office visit documented weight. Please get this scheduled so I can refill

## 2023-07-27 ENCOUNTER — Ambulatory Visit (INDEPENDENT_AMBULATORY_CARE_PROVIDER_SITE_OTHER): Admitting: Clinical

## 2023-07-27 ENCOUNTER — Other Ambulatory Visit: Payer: Self-pay | Admitting: Medical Genetics

## 2023-07-27 ENCOUNTER — Ambulatory Visit: Admitting: Family Medicine

## 2023-07-27 DIAGNOSIS — F419 Anxiety disorder, unspecified: Secondary | ICD-10-CM | POA: Diagnosis not present

## 2023-07-27 DIAGNOSIS — F840 Autistic disorder: Secondary | ICD-10-CM | POA: Diagnosis not present

## 2023-07-27 DIAGNOSIS — F902 Attention-deficit hyperactivity disorder, combined type: Secondary | ICD-10-CM

## 2023-07-27 NOTE — Telephone Encounter (Signed)
 If I have any same day appts open that'd be fine to put her in but otherwise she'll have to wait for the 22nd.

## 2023-07-27 NOTE — Progress Notes (Signed)
 [facility] Counselor/Therapist Progress Note  Patient ID: Sarah Shah, MRN: [id number]    Date: 07/27/23  Time Spent: 12:54 pm - 1:44 pm: 50 Minutes  Type of Service Provided Individual Therapy  Type of Contact in-person Location: office  Mental Status Exam: Appearance:  Casual and Fairly Groomed     Behavior: Sharing  Motor: Normal  Speech/Language:  Clear and Coherent and Normal Rate  Affect: Appropriate  Mood: normal  Thought process: normal  Thought content:   WNL  Sensory/Perceptual disturbances:   WNL  Orientation: oriented to person, place, time/date, and situation  Attention: Fair to good  Concentration: Fair to good  Memory: WNL  Fund of knowledge:  Fair  Insight:   Fair  Judgment:  Fair  Impulse Control: Fair   Risk Assessment:  Danger to Self:  No - denied SI since last visit Self-injurious Behavior: No - denied SIB since last visit Danger to Others: No Duty to Warn:no  Presenting Problems, Reported Symptoms, and /or Interim History: Roxane presented for a session to address anxiety and irritation.   Subjective: Micaylah presented for an individual outpatient therapy session. The following was addressed during sessions.   Loyola rated her mood as a 9 out of 10 (with 1 being sad, 5 being neutral, and 10 being happy).  Danaria rated her anxiety as a 8 out of 10 and irritation/frustration as a 9 out of 10 (with 10 being high). Anxiety and irritation are related to the upcoming completion of her summer class, being stuck at home due to her lack of transportation and license, and adjusting back to being at home after a 2-week vacation with other family members. Strategies to address her negative feelings about being stuck at home and to increase socialization opportunities were discussed. [name] reported that she has tried to take the driving test previously but did not pass, and she related at least some of this challenge to anxiety about test taking.   Strategies she could use to manage her anxiety prior and during test taking were discussed.  Identity exploration was also discussed.  Interventions/Psychotherapy Techniques Used During Session: Cognitive Behavioral Therapy and executive functioning skills strategies  Diagnosis: Autism spectrum disorder  Anxiety  Attention deficit hyperactivity disorder (ADHD), combined type  MENTAL HEALTH INTERVENTIONS USED DURING TREATMENT & PATIENT'S RESPONSE TO INTERVENTIONS:  Short-term Objective addressed today:Bernadene will engage in regular appropriate relaxation and coping activities to try to enhance her overall coping resources Mental health techniques used: Objective was addressed in session through the use of Cognitive Behavioral Therapy and discussion. Wretha's response was positive. Progress Toward Goal: some progress  Short-term Objective addressed today:Carsynn will begin to study for her drivers test Mental health techniques used: Objective was addressed in session through the use of Cognitive Behavioral Therapy and executive functioning strategies, and discussion. Vernal's response was positive. Progress Toward Goal: progressing    PLAN  1. Sintia will return for a therapy session.   2. Homework Given: Use anxiety management strategies to get through the end of her summer class, study for her driving test, during the break between semesters spends some time researching clubs or groups in the area including ones available at her college and more general groups. This homework will be reviewed with [name] at the next visit.  3. During the next session check-in on mood, anxiety, socialization opportunities.     [name], [name]  Individual Treatment Plan  - please see the notes from 04/17/2021 & 05/01/2021 for the initial  treatment plan, the updates in Winter 2024, and the goal review from 06/02/2022, and another goal review from 05/18/2023 for complete treatment plan information.    Updated goals as of  May 2025 Problem/Need: Ruchy has a history of anxiety and mood concerns - with some ongoing challenges in these areas  Long-Term Goal #1: Tanetta will be able to identify and address any mood or anxiety challenges and will maintain a low level of anxiety and depression symptoms as evidenced by self-report.  Updated Short-Term Objectives as of 2025: Objective 1A: Maeley will be able to identify any negative thinking patterns that are perpetuating anxiety or depressive symptoms and replace these with more adaptive thoughts. - GOAL Partially met  Objective 1B: Sanjna will engage in behavioral activation strategies to decrease symptoms of depression Objective 1C: Jennesis will engage in regular appropriate relaxation and coping activities to try to enhance her overall coping resources Objective 1D: Kaci will be able to appropriately manage periods of depressive symptoms and anxiety without using negative coping strategies such as self-injurious behaviors Objective 1E: Polina will be able to use appropriate coping strategies including helpful thoughts to maintain a low level of anxiety and depressive symptoms. Interventions: Cognitive Behavioral Therapy, Psychologist, occupational, Motivational Interviewing, and Psycho-education/Bibliotherapy  coping skills and other evidenced-based practices will be used to promote progress towards healthy functioning and to help manage decrease symptoms associated with their diagnosis.  Treatment Regimen: Individual skill building sessions every 2-4 weeks to address treatment goal/objective Target Date: 05/2024 Responsible Party: therapist and patient Person delivering treatment: Licensed Psychologist [name], [name] will support the patient's ability to achieve the goals identified. Resolved:  No -as of May 2025, Lavender has continued to make progress with managing her anxiety and depression, but has periodically experienced increases in her symptoms of depression as well as  anxiety and inconsistently uses her coping resources when experiencing these increases -therefore, this goal is continued   Problem/Need: Life transition - Kiva is in the process of completing a major life transition including graduating from high school, starting college, and potentially working. - As of 06/02/2022 Kimmarie has graduated from high school and successfully completed 2 semesters of college. However she continues to have life transitions, so the problem/need section has been updated - As of 05/18/2023 Kaytlynn has continued to work towards completing her life transitions  Updated Problem/Need: Life transition - Offie has continuing life transitions including working towards completing her college program and getting a job.  Long-Term Goal #2: Vera will successfully navigate these life transitions including being able to manage her stress level.  Updated Short-Term Objectives as of 2025: Current stressors associated with life transitions that Shannara continues to struggle with include time management and expanding her social circle Objective 2A: Pihu will be able to develop a flexible schedule that includes completing her school work and home tasks Objective 2B: Eron will identify people or settings where there is the possibility of developing a friendship  Objective 2C: Rayla will begin to consider employment opportunities.  Objective 2D: Suri will complete necessary coursework Interventions: Assertiveness/Communication, Psychologist, occupational, and Psycho-education/Bibliotherapy , executive functioning skills strategies and other evidenced-based practices will be used to promote progress towards healthy functioning and to help manage decrease symptoms associated with their diagnosis.  Treatment Regimen: Individual skill building sessions every 2-4 weeks to address treatment goal/objective Target Date: 05/2024 Responsible Party: therapist and patient Person delivering treatment: Licensed  Psychologist [name], [name] will support the patient's ability to achieve the goals  identified. Resolved:  No  Problem/Need: Life skills - Robyne needs to develop some additional skills to facilitate independent living. Long-Term Goal #3: Banita will begin to develop additional life skills necessary to facilitate independent living.  Updated Short-Term Objectives as of 2025: areas identified include money management, cooking, and transportation Objective 3A: Keandria will begin to study for her drivers test Objective 3B: Kegan will understand her money spending and saving priorities  Objective 3C: Yvonna will develop and stick to a budget  Interventions: Motivational Interviewing, Solution-Oriented/Positive Psychology, and Psycho-education/Bibliotherapy  daily life skills, and other evidenced-based practices will be used to promote progress towards healthy functioning and to help manage decrease symptoms associated with their diagnosis.  Treatment Regimen: Individual skill building sessions every 2-4 weeks to address treatment goal/objective Target Date: 05/2024 Responsible Party: therapist and patient Person delivering treatment: Licensed Psychologist [name], [name] will support the patient's ability to achieve the goals identified. Resolved:  No - Kenza has made some strides towards identifying areas that need to be addressed to facilitate independent living and made some progress on developing those skills, but process continues   [name], [name]

## 2023-07-27 NOTE — Telephone Encounter (Signed)
 Sent pt MyChart note about scheduling and office visit with Tabitha to follow-up weight loss.

## 2023-07-27 NOTE — Telephone Encounter (Signed)
 Archiving CMM key and signing encounter. Please let PA team know when patient has completed her appointment to continue with PA.

## 2023-07-27 NOTE — Telephone Encounter (Signed)
 Pt has appt sch for 08/03/23

## 2023-07-29 ENCOUNTER — Other Ambulatory Visit (HOSPITAL_COMMUNITY): Payer: Self-pay

## 2023-07-29 NOTE — Telephone Encounter (Signed)
 Copied from CRM [id number]. Topic: Clinical - Prescription Issue >> Jul 29, 2023 12:07 PM Chiquita SQUIBB wrote: Reason for CRM: Patient and the mother called in stating that the patient left her last dose on vacation so she has now been without it for two weeks already, and that is why they are requesting it.  They also wanted to confirm that she is on the 10 MG and this was the correct PA. They would like to wait until the 22nd appointment, but are asking if anything can be done in the mean time since she never got her last dose.

## 2023-07-29 NOTE — Telephone Encounter (Signed)
 Left message on VM per DPR for pt's mom advising her there is nothing we can do prior to the appointment because insurance is requiring specific information be provided. The last rx sent to the pharmacy was for the 10 mg. They need to check with the pharmacy that they are trying to fill the 10 mg so the correct claim for PA is done.

## 2023-07-29 NOTE — Telephone Encounter (Signed)
 There is nothing that can be done until the office visit so proper documentation can be done.

## 2023-07-30 ENCOUNTER — Other Ambulatory Visit (HOSPITAL_COMMUNITY)

## 2023-08-03 ENCOUNTER — Other Ambulatory Visit (HOSPITAL_COMMUNITY): Payer: Self-pay

## 2023-08-03 ENCOUNTER — Encounter: Payer: Self-pay | Admitting: Family

## 2023-08-03 ENCOUNTER — Telehealth: Payer: Self-pay

## 2023-08-03 ENCOUNTER — Ambulatory Visit: Admitting: Family

## 2023-08-03 ENCOUNTER — Telehealth: Payer: Self-pay | Admitting: Family

## 2023-08-03 ENCOUNTER — Encounter: Payer: Self-pay | Admitting: Physician Assistant

## 2023-08-03 VITALS — BP 124/80 | HR 94 | Ht 65.0 in | Wt 324.2 lb

## 2023-08-03 DIAGNOSIS — E66813 Obesity, class 3: Secondary | ICD-10-CM | POA: Diagnosis not present

## 2023-08-03 DIAGNOSIS — R0683 Snoring: Secondary | ICD-10-CM | POA: Diagnosis not present

## 2023-08-03 DIAGNOSIS — M778 Other enthesopathies, not elsewhere classified: Secondary | ICD-10-CM | POA: Diagnosis not present

## 2023-08-03 DIAGNOSIS — E782 Mixed hyperlipidemia: Secondary | ICD-10-CM | POA: Diagnosis not present

## 2023-08-03 DIAGNOSIS — G4719 Other hypersomnia: Secondary | ICD-10-CM

## 2023-08-03 DIAGNOSIS — Z6841 Body Mass Index (BMI) 40.0 and over, adult: Secondary | ICD-10-CM

## 2023-08-03 DIAGNOSIS — E282 Polycystic ovarian syndrome: Secondary | ICD-10-CM | POA: Diagnosis not present

## 2023-08-03 MED ORDER — ZEPBOUND 5 MG/0.5ML ~~LOC~~ SOAJ: 5.0000 mg | SUBCUTANEOUS | 0 refills | Status: DC

## 2023-08-03 NOTE — Progress Notes (Signed)
 Established Patient Office Visit  Subjective:   Patient ID: Sarah Shah, female    DOB: May 16, 2000  Age: 23 y.o. MRN: [id number]  CC:  Chief Complaint  Patient presents with   Medical Management of Chronic Issues    HPI: Sarah Shah is a 23 y.o. female presenting on 08/03/2023 for Medical Management of Chronic Issues  Obesity: has been without zepbound  7.5 mg for three weeks because she accidentally left her medication on vacation. Has been tolerating it well with some weight loss, finally feeling better about herself   Exercise: walking every day at least 30 minutes as this also helps her depression.  Diet: was able to limit portions, avoiding sweats and white carbs, didn't crave any of this due to decreased appetite and with the GLP 1 , she is still on pescatarian diet.  Wt Readings from Last 3 Encounters:  08/03/23 (!) 324 lb 3.2 oz (147.1 kg)  05/16/23 (!) 320 lb (145.2 kg)  05/16/23 (!) 326 lb 1 oz (147.9 kg)   Therapy, she does go regularly she is going every other week, this is going well for her. She is also being treated for emotional eating with binge eating disorder of which GLP 1 has been very helpful for.   Excessive daytime sleepiness, snoring, and headaches in am intermittently.  Sleep study for at home was ordered but not approved. Needing referral.   Right wrist tendinitis: crochets often and this has been aggravating her. Pain directly on posterior wrist, worse with movement.      ROS: Negative unless specifically indicated above in HPI.   Relevant past medical history reviewed and updated as indicated.   Allergies and medications reviewed and updated.   Current Outpatient Medications:    acetaminophen  (TYLENOL ) 325 MG tablet, Take 650 mg by mouth every 6 (six) hours as needed., Disp: , Rfl:    Amphetamine  Sulfate 10 MG TABS, Take 1 tablet by mouth daily as needed., Disp: , Rfl:    amphetamine -dextroamphetamine  (ADDERALL XR) 30 MG 24 hr  capsule, Take 1 capsule (30 mg total) by mouth in the morning., Disp: 30 capsule, Rfl: 0   amphetamine -dextroamphetamine  (ADDERALL XR) 30 MG 24 hr capsule, Take 1 capsule (30 mg total) by mouth in the morning., Disp: 30 capsule, Rfl: 0   brompheniramine-pseudoephedrine-DM 30-2-10 MG/5ML syrup, Take 5 mLs by mouth 4 (four) times daily as needed., Disp: 240 mL, Rfl: 0   busPIRone  (BUSPAR ) 30 MG tablet, Take 30 mg by mouth 2 (two) times daily., Disp: , Rfl:    cloNIDine  (CATAPRES ) 0.2 MG tablet, Take 0.2 mg by mouth 2 (two) times daily., Disp: , Rfl:    cromolyn (OPTICROM) 4 % ophthalmic solution, SMARTSIG:1 Drop(s) In Eye(s) Twice Daily PRN, Disp: , Rfl:    desogestrel -ethinyl estradiol  (VOLNEA ) 0.15-0.02/0.01 MG (21/5) tablet, TAKE 1 TABLET BY MOUTH EVERY DAY. TAKE ACTIVE TABLETS ONLY, SKIP PLACEBO TABLETS., Disp: 90 tablet, Rfl: 0   docusate sodium (COLACE) 100 MG capsule, Take 100 mg by mouth at bedtime., Disp: , Rfl:    famotidine  (PEPCID ) 20 MG tablet, Take 20 mg by mouth daily., Disp: , Rfl:    ferrous sulfate 325 (65 FE) MG tablet, Take 325 mg by mouth at bedtime., Disp: , Rfl:    omeprazole  (PRILOSEC) 40 MG capsule, Take 1 capsule (40 mg total) by mouth daily., Disp: 90 capsule, Rfl: 4   RESTASIS 0.05 % ophthalmic emulsion, 1 drop 2 (two) times daily., Disp: , Rfl:    tirzepatide  (  ZEPBOUND ) 5 MG/0.5ML Pen, Inject 5 mg into the skin once a week., Disp: 2 mL, Rfl: 0   venlafaxine XR (EFFEXOR-XR) 150 MG 24 hr capsule, Take 150 mg by mouth daily., Disp: , Rfl:    venlafaxine XR (EFFEXOR-XR) 75 MG 24 hr capsule, Take 75 mg by mouth daily., Disp: , Rfl:    vitamin C (ASCORBIC ACID) 500 MG tablet, Take 500 mg by mouth at bedtime., Disp: , Rfl:   Allergies  Allergen Reactions   Wound Dressing Adhesive Itching    Itchy and irritated.   Semaglutide  Nausea And Vomiting    Objective:   BP 124/80 (BP Location: Right Arm, Patient Position: Sitting, Cuff Size: Large)   Pulse 94   Ht 5' 5 (1.651  m)   Wt (!) 324 lb 3.2 oz (147.1 kg)   SpO2 96%   BMI 53.95 kg/m    Physical Exam Vitals reviewed.  Constitutional:      General: She is not in acute distress.    Appearance: Normal appearance. She is normal weight. She is not ill-appearing, toxic-appearing or diaphoretic.  HENT:     Head: Normocephalic.  Cardiovascular:     Rate and Rhythm: Normal rate.  Pulmonary:     Effort: Pulmonary effort is normal.  Musculoskeletal:        General: Normal range of motion.  Neurological:     General: No focal deficit present.     Mental Status: She is alert and oriented to person, place, and time. Mental status is at baseline.  Psychiatric:        Mood and Affect: Mood normal.        Behavior: Behavior normal.        Thought Content: Thought content normal.        Judgment: Judgment normal.   Right wrist some tingling on allens test   Assessment & Plan:  Class 3 severe obesity with serious comorbidity and body mass index (BMI) of 50.0 to 59.9 in adult Assessment & Plan: Was doing well with zepbound  however needs prior auth with insurance for approval Restart 5 mg once weekly sending now as without 7.5 mg for three weeks Monitor symptoms advise me if not tolerating.  Pt advised to work on diet and exercise as tolerated  Has been greatly beneficial for pt mental and physical wellness.     Orders: -     Zepbound ; Inject 5 mg into the skin once a week.  Dispense: 2 mL; Refill: 0  PCOS (polycystic ovarian syndrome) -     Zepbound ; Inject 5 mg into the skin once a week.  Dispense: 2 mL; Refill: 0  Mixed hyperlipidemia -     Zepbound ; Inject 5 mg into the skin once a week.  Dispense: 2 mL; Refill: 0  Excessive daytime sleepiness Assessment & Plan: Referral to pulmonary  Sleep at home study not approved with insurance Symptomatic for suspected OSA   Orders: -     Pulmonary Visit  Snoring -     Pulmonary Visit  Right wrist tendonitis Assessment & Plan: Ar brace at night   Compression sleeve during aggravating activities such as crocheting.       Follow up plan: Return in about 3 months (around 11/03/2023) for f/u weight loss medication.  Ginger Patrick, FNP

## 2023-08-03 NOTE — Telephone Encounter (Signed)
 Please send in prior auth for 5 mg zepbound 

## 2023-08-03 NOTE — Telephone Encounter (Signed)
 Pharmacy Patient Advocate Encounter   Received notification from Physician's Office that prior authorization for Zepbound  5 is required/requested.   Insurance verification completed.   The patient is insured through [facility] .   Per test claim: PA required; PA submitted to above mentioned insurance via CoverMyMeds Key/confirmation #/EOC [id number] Status is pending

## 2023-08-03 NOTE — Assessment & Plan Note (Signed)
 Referral to pulmonary  Sleep at home study not approved with insurance Symptomatic for suspected OSA

## 2023-08-03 NOTE — Telephone Encounter (Signed)
 Pharmacy Patient Advocate Encounter  Received notification from [facility] that Prior Authorization for Zepbound  5 has been APPROVED from 08/03/23 to 08/02/24. Filling pharmacy will have to override DUR   PA #/Case ID/Reference #: [id number]

## 2023-08-03 NOTE — Assessment & Plan Note (Addendum)
 Was doing well with zepbound  however needs prior auth with insurance for approval Restart 5 mg once weekly sending now as without 7.5 mg for three weeks Monitor symptoms advise me if not tolerating.  Pt advised to work on diet and exercise as tolerated  Has been greatly beneficial for pt mental and physical wellness.

## 2023-08-03 NOTE — Assessment & Plan Note (Signed)
 Ar brace at night  Compression sleeve during aggravating activities such as crocheting.

## 2023-08-04 ENCOUNTER — Telehealth: Payer: Self-pay | Admitting: Sleep Medicine

## 2023-08-04 NOTE — Telephone Encounter (Signed)
 LVMTCB to schedule sleep consult.

## 2023-08-10 ENCOUNTER — Ambulatory Visit (INDEPENDENT_AMBULATORY_CARE_PROVIDER_SITE_OTHER): Admitting: Clinical

## 2023-08-10 DIAGNOSIS — F32A Depression, unspecified: Secondary | ICD-10-CM | POA: Diagnosis not present

## 2023-08-10 DIAGNOSIS — F840 Autistic disorder: Secondary | ICD-10-CM

## 2023-08-10 DIAGNOSIS — F419 Anxiety disorder, unspecified: Secondary | ICD-10-CM

## 2023-08-10 DIAGNOSIS — F902 Attention-deficit hyperactivity disorder, combined type: Secondary | ICD-10-CM | POA: Diagnosis not present

## 2023-08-10 NOTE — Progress Notes (Signed)
 [facility] Counselor/Therapist Progress Note  Patient ID: Sarah Shah, MRN: [id number]    Date: 08/10/23  Time Spent: 1:08 pm - 1:59 pm: 51 Minutes  Type of Service Provided Individual Therapy  Type of Contact in-person Location: office   Mental Status Exam: Appearance:  Casual and slightly disheveled    Behavior: Appropriate  Motor: Normal, a bit fidgety   Speech/Language:  Clear and Coherent and Normal Rate  Affect: variable  Mood: normal  Thought process: normal  Thought content:   WNL  Sensory/Perceptual disturbances:   WNL  Orientation: oriented to person, place, time/date, and situation  Attention: Good  Concentration: Good  Memory: WNL  Fund of knowledge:  Fair  Insight:   Fair  Judgment:  Fair  Impulse Control: Fair   Risk Assessment:  Danger to Self:  No - denied SI but reported some general unhappiness about life and worries about the future; denied SI plans, intent, or attempts Self-injurious Behavior: Sarah Shah reported one incident of cutting between visits. She reported that several weeks ago she used a crafting tool to cut the top of her forearm superficially (she reported that mark had healed and was no longer visible). She discussed feeling overwhelmed and guilty beforehand but after the initial cut she was able to stop the behavior. Strategies she could use next time to avoid cutting were discussed.  Danger to Others: No Duty to Warn:no  - therapist reviewed emergency procedures with Sarah Shah and she agreed to talk to her mother or sister or go to the behavioral health urgent care if her negative thoughts escalate or she begins to think about engaging in cutting again.   Presenting Problems, Reported Symptoms, and /or Interim History: Sarah Shah presented for a session to address anxiety and mood concerns.   Subjective: Sarah Shah presented for an individual outpatient therapy session. The following was addressed during sessions.   Sarah Shah reported that  her mood was neutral. She was experiencing some anxiety and irritation/frustration. Some stressors at home were discussed. Sarah Shah also reported having challenges because she had to stop her weight loss medication temporarily, started making what she described as poorer food choices, and then felt guilty and upset, eventually leading to her cutting behavior. Some of her negative thinking was reframed and strategies that she could use next time instead of cutting were discussed. She has restarted her medication for weight and was doing better with eating. Sarah Shah did find a group that she is interested in trying out. Anxiety and mood management strategies were discussed.       Interventions/Psychotherapy Techniques Used During Session: Engineer, manufacturing systems Therapy and Psychologist, occupational and solution focused strategies  Diagnosis: Depression, unspecified depression type  Autism spectrum disorder  Attention deficit hyperactivity disorder (ADHD), combined type  Anxiety  MENTAL HEALTH INTERVENTIONS USED DURING TREATMENT & PATIENT'S RESPONSE TO INTERVENTIONS:  Short-term Objective addressed today:Sarah Shah will engage in behavioral activation strategies to decrease symptoms of depression AND Sarah Shah will engage in regular appropriate relaxation and coping activities to try to enhance her overall coping resources Mental health techniques used: Objective was addressed in session through the use of Cognitive Behavioral Therapy and discussion. Sarah Shah's response was mostly positive. Progress Toward Goal: slight progress, though there was one incident of SIB between visits  Short-term Objective addressed today:Sarah Shah will identify people or settings where there is the possibility of developing a friendship  Mental health techniques used: Objective was addressed in session through the use of Social Skills Training, solution focused strategies, and discussion.  Sarah Shah's response was mostly positive. Progress Toward Goal:  some progress    PLAN  1. Sarah Shah will return for a therapy session.   2. Homework Given:  try out the group that she is interested in joining, use anxiety and mood management strategies, prepare for school. This homework will be reviewed with Sarah Shah at the next visit.  3. During the next session discuss mood, anxiety, school, expanding social/community connection.     Sarah Dumas, [name]  Individual Treatment Plan  - please see the notes from 04/17/2021 & 05/01/2021 for the initial treatment plan, the updates in Winter 2024, and the goal review from 06/02/2022, and another goal review from 05/18/2023 for complete treatment plan information.    Updated goals as of May 2025 Problem/Need: Sarah Shah has a history of anxiety and mood concerns - with some ongoing challenges in these areas  Long-Term Goal #1: Sarah Shah will be able to identify and address any mood or anxiety challenges and will maintain a low level of anxiety and depression symptoms as evidenced by self-report.  Updated Short-Term Objectives as of 2025: Objective 1A: Sarah Shah will be able to identify any negative thinking patterns that are perpetuating anxiety or depressive symptoms and replace these with more adaptive thoughts. - GOAL Partially met  Objective 1B: Sarah Shah will engage in behavioral activation strategies to decrease symptoms of depression Objective 1C: Sarah Shah will engage in regular appropriate relaxation and coping activities to try to enhance her overall coping resources Objective 1D: Sarah Shah will be able to appropriately manage periods of depressive symptoms and anxiety without using negative coping strategies such as self-injurious behaviors Objective 1E: Sarah Shah will be able to use appropriate coping strategies including helpful thoughts to maintain a low level of anxiety and depressive symptoms. Interventions: Cognitive Behavioral Therapy, Psychologist, occupational, Motivational Interviewing, and Psycho-education/Bibliotherapy  coping skills  and other evidenced-based practices will be used to promote progress towards healthy functioning and to help manage decrease symptoms associated with their diagnosis.  Treatment Regimen: Individual skill building sessions every 2-4 weeks to address treatment goal/objective Target Date: 05/2024 Responsible Party: therapist and patient Person delivering treatment: Licensed Psychologist Sarah Dumas, [name] will support the patient's ability to achieve the goals identified. Resolved:  No -as of May 2025, Sarah Shah has continued to make progress with managing her anxiety and depression, but has periodically experienced increases in her symptoms of depression as well as anxiety and inconsistently uses her coping resources when experiencing these increases -therefore, this goal is continued   Problem/Need: Life transition - Sarah Shah is in the process of completing a major life transition including graduating from high school, starting college, and potentially working. - As of 06/02/2022 Sarah Shah has graduated from high school and successfully completed 2 semesters of college. However she continues to have life transitions, so the problem/need section has been updated - As of 05/18/2023 Sarah Shah has continued to work towards completing her life transitions  Updated Problem/Need: Life transition - Sarah Shah has continuing life transitions including working towards completing her college program and getting a job.  Long-Term Goal #2: Ramata will successfully navigate these life transitions including being able to manage her stress level.  Updated Short-Term Objectives as of 2025: Current stressors associated with life transitions that [place] continues to struggle with include time management and expanding her social circle Objective 2A: Sarah Shah will be able to develop a flexible schedule that includes completing her school work and home tasks Objective 2B: Sarah Shah will identify people or settings where there is the possibility of  developing a friendship  Objective 2C: Sarah Shah will begin to consider employment opportunities.  Objective 2D: Sarah Shah will complete necessary coursework Interventions: Assertiveness/Communication, Psychologist, occupational, and Psycho-education/Bibliotherapy , executive functioning skills strategies and other evidenced-based practices will be used to promote progress towards healthy functioning and to help manage decrease symptoms associated with their diagnosis.  Treatment Regimen: Individual skill building sessions every 2-4 weeks to address treatment goal/objective Target Date: 05/2024 Responsible Party: therapist and patient Person delivering treatment: Licensed Psychologist Sarah Dumas, [name] will support the patient's ability to achieve the goals identified. Resolved:  No  Problem/Need: Life skills - Sarah Shah needs to develop some additional skills to facilitate independent living. Long-Term Goal #3: Yizel will begin to develop additional life skills necessary to facilitate independent living.  Updated Short-Term Objectives as of 2025: areas identified include money management, cooking, and transportation Objective 3A: Adrine will begin to study for her drivers test Objective 3B: Tassie will understand her money spending and saving priorities  Objective 3C: Polette will develop and stick to a budget  Interventions: Motivational Interviewing, Solution-Oriented/Positive Psychology, and Psycho-education/Bibliotherapy  daily life skills, and other evidenced-based practices will be used to promote progress towards healthy functioning and to help manage decrease symptoms associated with their diagnosis.  Treatment Regimen: Individual skill building sessions every 2-4 weeks to address treatment goal/objective Target Date: 05/2024 Responsible Party: therapist and patient Person delivering treatment: Licensed Psychologist Sarah Dumas, [name] will support the patient's ability to achieve the goals  identified. Resolved:  No - Bellamy has made some strides towards identifying areas that need to be addressed to facilitate independent living and made some progress on developing those skills, but process continues   Sarah Dumas, [name]

## 2023-08-11 NOTE — Telephone Encounter (Signed)
 LVMTCB to schedule sleep consult.

## 2023-08-19 ENCOUNTER — Encounter: Payer: Self-pay | Admitting: Family

## 2023-08-20 NOTE — Telephone Encounter (Signed)
Make appt to discuss further.

## 2023-08-23 NOTE — Telephone Encounter (Signed)
 Needs office visit to discuss

## 2023-08-24 ENCOUNTER — Ambulatory Visit (INDEPENDENT_AMBULATORY_CARE_PROVIDER_SITE_OTHER): Admitting: Clinical

## 2023-08-24 DIAGNOSIS — F840 Autistic disorder: Secondary | ICD-10-CM

## 2023-08-24 DIAGNOSIS — F32A Depression, unspecified: Secondary | ICD-10-CM

## 2023-08-24 DIAGNOSIS — F419 Anxiety disorder, unspecified: Secondary | ICD-10-CM

## 2023-08-24 NOTE — Telephone Encounter (Signed)
 Pt has appt sch for 8/21

## 2023-08-24 NOTE — Progress Notes (Signed)
 [facility] Counselor/Therapist Progress Note  Patient ID: Sarah Shah, MRN: [id number]    Date: 08/24/23  Time Spent: 2:07 pm - 2:55 pm: 48 Minutes  Type of Service Provided Individual Therapy  Type of Contact in-person Location: office  Mental Status Exam: Appearance:  Casual and a bit disheveled      Behavior: Sharing  Motor: Normal  Speech/Language:  Clear and Coherent and Normal Rate  Affect: Appropriate  Mood: normal to positive  Thought process: normal  Thought content:   WNL  Sensory/Perceptual disturbances:   WNL  Orientation: oriented to person, place, situation, and day of week  Attention: Good  Concentration: Good  Memory: WNL  Fund of knowledge:  Fair  Insight:   Fair  Judgment:  Fair  Impulse Control: Fair   Risk Assessment:  Danger to Self:  No - denied SI since last visit Self-injurious Behavior: No - denied SIB since last visit Danger to Others: No Duty to Warn:no  - Client was informed of clinician's upcoming out of office time and emergency procedures were reviewed.      Presenting Problems, Reported Symptoms, and /or Interim History: Paolina presented for a session to address anxiety and mood concerns.   Subjective: Nala presented for an individual outpatient therapy session. The following was addressed during sessions.   Neli reported that her mood was neutral. She was experiencing a medium amount of anxiety and irritation/frustration. She reported that her anxiety is related to school starting next week and having to take 3 classes including one she did not pass previously. Strategies that she can use to address anxiety before school were discussed.  Aisling reported that she has engaged in some coping/behavioral activation behaviors including going for a daily walk outside for 30 minutes. Sahvannah is going on a trip soon, which she is excited about. However, she discussed some increased spending recently, and budgeting and long term  goals were discussed. Ongoing challenges with impulsivity when feeling down were discussed.   Interventions/Psychotherapy Techniques Used During Session: Cognitive Behavioral Therapy, executive functioning and life skills strategies   Diagnosis: Depression, unspecified depression type  Autism spectrum disorder  Anxiety  MENTAL HEALTH INTERVENTIONS USED DURING TREATMENT & PATIENT'S RESPONSE TO INTERVENTIONS:  Short-term Objective addressed today:Shellby will engage in behavioral activation strategies to decrease symptoms of depression AND Zyon will engage in regular appropriate relaxation and coping activities to try to enhance her overall coping resources Mental health techniques used: Objective was addressed in session through the use of Cognitive Behavioral Therapy and discussion. Reiko's response was positive. Progress Toward Goal: progressing  Short-term Objective addressed today:[name] will understand her money spending and saving priorities AND Nguyen will develop and stick to a budget. Mental health techniques used: Objective was addressed in session through the use of executive functioning and life skills strategies and discussion. Marwa's response was mostly positive. Progress Toward Goal: slight progress but she continues to have difficulty putting off purchases of items that she wants    PLAN  1. Odeal will return for a therapy session.   2. Homework Given:  use anxiety management strategies in the run up to school. This homework will be reviewed with [name] at the next visit.  3. During the next session check in on the start of school, anxiety, mood, and frustration management at home.     [name], [name]  Individual Treatment Plan  - please see the notes from 04/17/2021 & 05/01/2021 for the initial treatment plan, the updates in Winter  2024, and the goal review from 06/02/2022, and another goal review from 05/18/2023 for complete treatment plan information.    Updated goals as of May  2025 Problem/Need: Shaeleigh has a history of anxiety and mood concerns - with some ongoing challenges in these areas  Long-Term Goal #1: Sky will be able to identify and address any mood or anxiety challenges and will maintain a low level of anxiety and depression symptoms as evidenced by self-report.  Updated Short-Term Objectives as of 2025: Objective 1A: Alanya will be able to identify any negative thinking patterns that are perpetuating anxiety or depressive symptoms and replace these with more adaptive thoughts. - GOAL Partially met  Objective 1B: Journie will engage in behavioral activation strategies to decrease symptoms of depression Objective 1C: Madysun will engage in regular appropriate relaxation and coping activities to try to enhance her overall coping resources Objective 1D: Halia will be able to appropriately manage periods of depressive symptoms and anxiety without using negative coping strategies such as self-injurious behaviors Objective 1E: Phyllistine will be able to use appropriate coping strategies including helpful thoughts to maintain a low level of anxiety and depressive symptoms. Interventions: Cognitive Behavioral Therapy, Psychologist, occupational, Motivational Interviewing, and Psycho-education/Bibliotherapy  coping skills and other evidenced-based practices will be used to promote progress towards healthy functioning and to help manage decrease symptoms associated with their diagnosis.  Treatment Regimen: Individual skill building sessions every 2-4 weeks to address treatment goal/objective Target Date: 05/2024 Responsible Party: therapist and patient Person delivering treatment: Licensed Psychologist [name], [name] will support the patient's ability to achieve the goals identified. Resolved:  No -as of May 2025, Tyliyah has continued to make progress with managing her anxiety and depression, but has periodically experienced increases in her symptoms of depression as well as anxiety  and inconsistently uses her coping resources when experiencing these increases -therefore, this goal is continued   Problem/Need: Life transition - Ainsleigh is in the process of completing a major life transition including graduating from high school, starting college, and potentially working. - As of 06/02/2022 Vici has graduated from high school and successfully completed 2 semesters of college. However she continues to have life transitions, so the problem/need section has been updated - As of 05/18/2023 Yaslene has continued to work towards completing her life transitions  Updated Problem/Need: Life transition - Raye has continuing life transitions including working towards completing her college program and getting a job.  Long-Term Goal #2: Emonni will successfully navigate these life transitions including being able to manage her stress level.  Updated Short-Term Objectives as of 2025: Current stressors associated with life transitions that Mariana continues to struggle with include time management and expanding her social circle Objective 2A: Asa will be able to develop a flexible schedule that includes completing her school work and home tasks Objective 2B: Toyoko will identify people or settings where there is the possibility of developing a friendship  Objective 2C: Bonni will begin to consider employment opportunities.  Objective 2D: Gayla will complete necessary coursework Interventions: Assertiveness/Communication, Psychologist, occupational, and Psycho-education/Bibliotherapy , executive functioning skills strategies and other evidenced-based practices will be used to promote progress towards healthy functioning and to help manage decrease symptoms associated with their diagnosis.  Treatment Regimen: Individual skill building sessions every 2-4 weeks to address treatment goal/objective Target Date: 05/2024 Responsible Party: therapist and patient Person delivering treatment: Licensed Psychologist  [name], [name] will support the patient's ability to achieve the goals identified. Resolved:  No  Problem/Need:  Life skills - Eudelia needs to develop some additional skills to facilitate independent living. Long-Term Goal #3: Shakeira will begin to develop additional life skills necessary to facilitate independent living.  Updated Short-Term Objectives as of 2025: areas identified include money management, cooking, and transportation Objective 3A: [name] will begin to study for her drivers test Objective 3B: Luretta will understand her money spending and saving priorities  Objective 3C: Janee will develop and stick to a budget  Interventions: Motivational Interviewing, Solution-Oriented/Positive Psychology, and Psycho-education/Bibliotherapy  daily life skills, and other evidenced-based practices will be used to promote progress towards healthy functioning and to help manage decrease symptoms associated with their diagnosis.  Treatment Regimen: Individual skill building sessions every 2-4 weeks to address treatment goal/objective Target Date: 05/2024 Responsible Party: therapist and patient Person delivering treatment: Licensed Psychologist [name], [name] will support the patient's ability to achieve the goals identified. Resolved:  No - Niza has made some strides towards identifying areas that need to be addressed to facilitate independent living and made some progress on developing those skills, but process continues   [name], [name]

## 2023-09-01 ENCOUNTER — Institutional Professional Consult (permissible substitution): Admitting: Neurology

## 2023-09-02 ENCOUNTER — Encounter: Payer: Self-pay | Admitting: Family

## 2023-09-02 ENCOUNTER — Ambulatory Visit: Admitting: Family

## 2023-09-02 VITALS — BP 126/88 | HR 84 | Temp 98.3°F | Ht 65.0 in | Wt 326.8 lb

## 2023-09-02 DIAGNOSIS — E282 Polycystic ovarian syndrome: Secondary | ICD-10-CM | POA: Diagnosis not present

## 2023-09-02 DIAGNOSIS — R5383 Other fatigue: Secondary | ICD-10-CM | POA: Diagnosis not present

## 2023-09-02 DIAGNOSIS — E782 Mixed hyperlipidemia: Secondary | ICD-10-CM | POA: Diagnosis not present

## 2023-09-02 DIAGNOSIS — Z6841 Body Mass Index (BMI) 40.0 and over, adult: Secondary | ICD-10-CM | POA: Diagnosis not present

## 2023-09-02 DIAGNOSIS — E281 Androgen excess: Secondary | ICD-10-CM

## 2023-09-02 DIAGNOSIS — E559 Vitamin D deficiency, unspecified: Secondary | ICD-10-CM | POA: Diagnosis not present

## 2023-09-02 DIAGNOSIS — U071 COVID-19: Secondary | ICD-10-CM

## 2023-09-02 DIAGNOSIS — E66813 Obesity, class 3: Secondary | ICD-10-CM

## 2023-09-02 DIAGNOSIS — R635 Abnormal weight gain: Secondary | ICD-10-CM

## 2023-09-02 LAB — POC COVID19 BINAXNOW: SARS Coronavirus 2 Ag: POSITIVE — AB

## 2023-09-02 MED ORDER — ZEPBOUND 7.5 MG/0.5ML ~~LOC~~ SOAJ
7.5000 mg | SUBCUTANEOUS | 0 refills | Status: DC
Start: 2023-09-23 — End: 2023-10-25

## 2023-09-02 NOTE — Progress Notes (Signed)
 Established Patient Office Visit  Subjective:      CC:  Chief Complaint  Patient presents with   Medical Management of Chronic Issues    Discuss weight loss    HPI: Sarah Shah is a 23 y.o. female presenting on 09/02/2023 for Medical Management of Chronic Issues (Discuss weight loss) .  Discussed the use of AI scribe software for clinical note transcription with the patient, who gave verbal consent to proceed.  History of Present Illness Sarah Shah is a 23 year old female who presents with symptoms of a respiratory infection.  Symptoms began five days ago after exposure to rain at [employer], including congestion, sinus pressure, headache, and a persistent chest cough. Initially, she had a fever, which has since resolved. Symptoms are slightly improving, but she still experiences chest pain and is producing yellow sputum. No sore throat. She reports a slight wheeze. She has been using over-the-counter medications such as Mucinex  and cough medicine, which have been somewhat helpful.  She has a history of polycystic ovarian syndrome (PCOS) with elevated testosterone levels and insulin resistance. She previously tried metformin  but experienced gastrointestinal side effects. Currently, she is on continuous birth control to manage her PCOS symptoms and does not experience regular menstrual periods. She is also taking vitamin D  supplements and has recently started incorporating salmon into her diet.  She maintains an active lifestyle, walking for thirty minutes daily, even while experiencing her current illness. 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    Lab Results  Component Value Date   HGBA1C 5.1 04/20/2023    Wt Readings from Last 3 Encounters:  09/02/23 (!) 326 lb 12.8 oz (148.2 kg)  08/03/23 (!) 324 lb 3.2 oz (147.1 kg)  05/16/23 (!) 320 lb (145.2 kg)          Social history:  Relevant past medical,  surgical, family and social history reviewed and updated as indicated. Interim medical history since our last visit reviewed.  Allergies and medications reviewed and updated.  DATA REVIEWED: CHART IN EPIC     ROS: Negative unless specifically indicated above in HPI.    Current Outpatient Medications:    [START ON 09/23/2023] tirzepatide  (ZEPBOUND ) 7.5 MG/0.5ML Pen, Inject 7.5 mg into the skin once a week., Disp: 2 mL, Rfl: 0   acetaminophen  (TYLENOL ) 325 MG tablet, Take 650 mg by mouth every 6 (six) hours as needed., Disp: , Rfl:    Amphetamine  Sulfate 10 MG TABS, Take 1 tablet by mouth daily as needed., Disp: , Rfl:    amphetamine -dextroamphetamine  (ADDERALL XR) 30 MG 24 hr capsule, Take 1 capsule (30 mg total) by mouth in the morning., Disp: 30 capsule, Rfl: 0   amphetamine -dextroamphetamine  (ADDERALL XR) 30 MG 24 hr capsule, Take 1 capsule (30 mg total) by mouth in the morning., Disp: 30 capsule, Rfl: 0   brompheniramine-pseudoephedrine-DM 30-2-10 MG/5ML syrup, Take 5 mLs by mouth 4 (four) times daily as needed., Disp: 240 mL, Rfl: 0   busPIRone  (BUSPAR ) 30 MG tablet, Take 30 mg by mouth 2 (two) times daily., Disp: , Rfl:    cloNIDine  (CATAPRES ) 0.2 MG tablet, Take 0.2 mg by mouth 2 (two) times daily., Disp: , Rfl:    cromolyn (OPTICROM) 4 % ophthalmic solution, SMARTSIG:1 Drop(s) In Eye(s) Twice Daily PRN, Disp: , Rfl:    desogestrel -ethinyl estradiol  (VOLNEA ) 0.15-0.02/0.01 MG (21/5) tablet, TAKE 1 TABLET BY MOUTH EVERY DAY. TAKE ACTIVE TABLETS ONLY,  SKIP PLACEBO TABLETS., Disp: 90 tablet, Rfl: 0   docusate sodium (COLACE) 100 MG capsule, Take 100 mg by mouth at bedtime., Disp: , Rfl:    famotidine  (PEPCID ) 20 MG tablet, Take 20 mg by mouth daily., Disp: , Rfl:    ferrous sulfate 325 (65 FE) MG tablet, Take 325 mg by mouth at bedtime., Disp: , Rfl:    omeprazole  (PRILOSEC) 40 MG capsule, Take 1 capsule (40 mg total) by mouth daily., Disp: 90 capsule, Rfl: 4   RESTASIS 0.05 % ophthalmic  emulsion, 1 drop 2 (two) times daily., Disp: , Rfl:    venlafaxine XR (EFFEXOR-XR) 150 MG 24 hr capsule, Take 150 mg by mouth daily., Disp: , Rfl:    venlafaxine XR (EFFEXOR-XR) 75 MG 24 hr capsule, Take 75 mg by mouth daily., Disp: , Rfl:    vitamin C (ASCORBIC ACID) 500 MG tablet, Take 500 mg by mouth at bedtime., Disp: , Rfl:         Objective:        BP 126/88 (BP Location: Right Arm, Patient Position: Sitting, Cuff Size: Large)   Pulse 84   Temp 98.3 F (36.8 C) (Temporal)   Ht 5' 5 (1.651 m)   Wt (!) 326 lb 12.8 oz (148.2 kg)   SpO2 99%   BMI 54.38 kg/m   Physical Exam HEENT: Throat normal. CHEST: Lungs clear to auscultation, no wheezing.  Wt Readings from Last 3 Encounters:  09/02/23 (!) 326 lb 12.8 oz (148.2 kg)  08/03/23 (!) 324 lb 3.2 oz (147.1 kg)  05/16/23 (!) 320 lb (145.2 kg)    Physical Exam Vitals reviewed.  Constitutional:      General: She is not in acute distress.    Appearance: Normal appearance. She is normal weight. She is not ill-appearing, toxic-appearing or diaphoretic.  HENT:     Head: Normocephalic.  Cardiovascular:     Rate and Rhythm: Normal rate.  Pulmonary:     Effort: Pulmonary effort is normal.  Musculoskeletal:        General: Normal range of motion.  Neurological:     General: No focal deficit present.     Mental Status: She is alert and oriented to person, place, and time. Mental status is at baseline.  Psychiatric:        Mood and Affect: Mood normal.        Behavior: Behavior normal.        Thought Content: Thought content normal.        Judgment: Judgment normal.          Results LABS   COVID-19: Positive (09/02/2023)  Assessment & Plan:   Assessment and Plan Assessment & Plan COVID-19 infection Acute COVID-19 infection confirmed by positive test. Symptoms include fever, congestion, sinus pressure, headache, chest pain, and productive cough with yellow sputum. Symptoms began approximately six days ago and  are improving. Wheezing present. - Advise quarantine until symptom improvement and no fever for 24 hours without medication, then wear a mask for five days. - Provide a note for school absence due to illness. - Instruct to monitor symptoms and contact if no improvement after seven days.  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 Currently on tirzepatide  (Zepbound ) for weight management. Intolerance to metformin  due to gastrointestinal side effects. Continuous use of birth control for menstrual regulation. Engaging in regular physical activity despite illness. Discussed the potential impact of PCOS on  metabolic health and the importance of lifestyle modifications. Informed that tirzepatide  may decrease the effectiveness of birth control if sexually active. - Continue tirzepatide  (Zepbound ) therapy, increase dose to 7.5 mg when current supply is finished. - Order repeat androgen and sex hormone binding globulin levels in the future when not acutely ill. - Encourage continued physical activity as tolerated.  Recording duration: 15 minutes      Return in about 3 months (around 12/03/2023) for f/u weight loss medication.     Ginger Patrick, MSN, APRN, FNP-C [facility]

## 2023-09-03 ENCOUNTER — Other Ambulatory Visit (HOSPITAL_COMMUNITY): Payer: Self-pay

## 2023-09-03 ENCOUNTER — Ambulatory Visit: Payer: Self-pay | Admitting: Family

## 2023-09-03 DIAGNOSIS — Z6841 Body Mass Index (BMI) 40.0 and over, adult: Secondary | ICD-10-CM

## 2023-09-03 DIAGNOSIS — E8881 Metabolic syndrome: Secondary | ICD-10-CM

## 2023-09-03 DIAGNOSIS — E282 Polycystic ovarian syndrome: Secondary | ICD-10-CM

## 2023-09-03 NOTE — Progress Notes (Signed)
 noted

## 2023-09-16 ENCOUNTER — Other Ambulatory Visit: Payer: Self-pay | Admitting: Gastroenterology

## 2023-09-17 ENCOUNTER — Other Ambulatory Visit: Payer: Self-pay | Admitting: Nurse Practitioner

## 2023-09-21 ENCOUNTER — Ambulatory Visit: Admitting: Clinical

## 2023-09-24 ENCOUNTER — Other Ambulatory Visit

## 2023-09-24 DIAGNOSIS — F900 Attention-deficit hyperactivity disorder, predominantly inattentive type: Secondary | ICD-10-CM | POA: Diagnosis not present

## 2023-09-24 DIAGNOSIS — R5383 Other fatigue: Secondary | ICD-10-CM | POA: Diagnosis not present

## 2023-09-24 DIAGNOSIS — E559 Vitamin D deficiency, unspecified: Secondary | ICD-10-CM | POA: Diagnosis not present

## 2023-09-24 DIAGNOSIS — E282 Polycystic ovarian syndrome: Secondary | ICD-10-CM | POA: Diagnosis not present

## 2023-09-24 DIAGNOSIS — E281 Androgen excess: Secondary | ICD-10-CM | POA: Diagnosis not present

## 2023-09-24 DIAGNOSIS — R635 Abnormal weight gain: Secondary | ICD-10-CM | POA: Diagnosis not present

## 2023-09-24 DIAGNOSIS — F411 Generalized anxiety disorder: Secondary | ICD-10-CM | POA: Diagnosis not present

## 2023-09-24 DIAGNOSIS — F341 Dysthymic disorder: Secondary | ICD-10-CM | POA: Diagnosis not present

## 2023-09-24 LAB — TSH: TSH: 2.54 u[IU]/mL (ref 0.35–5.50)

## 2023-09-24 LAB — VITAMIN D 25 HYDROXY (VIT D DEFICIENCY, FRACTURES): VITD: 35.24 ng/mL (ref 30.00–100.00)

## 2023-09-27 NOTE — Telephone Encounter (Signed)
 Needs appt to discuss

## 2023-09-29 ENCOUNTER — Ambulatory Visit: Admitting: Family Medicine

## 2023-09-29 VITALS — BP 128/64 | HR 98 | Temp 98.7°F | Ht 65.0 in | Wt 314.8 lb

## 2023-09-29 DIAGNOSIS — R1013 Epigastric pain: Secondary | ICD-10-CM

## 2023-09-29 DIAGNOSIS — R109 Unspecified abdominal pain: Secondary | ICD-10-CM

## 2023-09-29 DIAGNOSIS — R197 Diarrhea, unspecified: Secondary | ICD-10-CM | POA: Diagnosis not present

## 2023-09-29 LAB — COMPREHENSIVE METABOLIC PANEL WITH GFR
ALT: 26 U/L (ref 0–35)
AST: 17 U/L (ref 0–37)
Albumin: 3.9 g/dL (ref 3.5–5.2)
Alkaline Phosphatase: 85 U/L (ref 39–117)
BUN: 6 mg/dL (ref 6–23)
CO2: 21 meq/L (ref 19–32)
Calcium: 9.7 mg/dL (ref 8.4–10.5)
Chloride: 106 meq/L (ref 96–112)
Creatinine, Ser: 0.52 mg/dL (ref 0.40–1.20)
GFR: 132.27 mL/min (ref >60.00)
Glucose, Bld: 89 mg/dL (ref 70–99)
Potassium: 3.3 meq/L — ABNORMAL LOW (ref 3.5–5.1)
Sodium: 139 meq/L (ref 135–145)
Total Bilirubin: 0.2 mg/dL (ref 0.2–1.2)
Total Protein: 7.1 g/dL (ref 6.0–8.3)

## 2023-09-29 LAB — POC URINALSYSI DIPSTICK (AUTOMATED)
Bilirubin, UA: NEGATIVE
Blood, UA: NEGATIVE
Glucose, UA: NEGATIVE
Ketones, UA: NEGATIVE
Leukocytes, UA: NEGATIVE
Nitrite, UA: NEGATIVE
Protein, UA: NEGATIVE
Spec Grav, UA: <=1.005 — AB (ref 1.010–1.025)
Urobilinogen, UA: NEGATIVE U/dL — AB
pH, UA: 6 (ref 5.0–8.0)

## 2023-09-29 LAB — CBC WITH DIFFERENTIAL/PLATELET
Basophils Absolute: 0 K/uL (ref 0.0–0.1)
Basophils Relative: 0.7 % (ref 0.0–3.0)
Eosinophils Absolute: 0 K/uL (ref 0.0–0.7)
Eosinophils Relative: 0.6 % (ref 0.0–5.0)
HCT: 41.1 % (ref 36.0–46.0)
Hemoglobin: 14.1 g/dL (ref 12.0–15.0)
Lymphocytes Relative: 32 % (ref 12.0–46.0)
Lymphs Abs: 2 K/uL (ref 0.7–4.0)
MCHC: 34.4 g/dL (ref 30.0–36.0)
MCV: 81.4 fl (ref 78.0–100.0)
Monocytes Absolute: 0.6 K/uL (ref 0.1–1.0)
Monocytes Relative: 9.5 % (ref 3.0–12.0)
Neutro Abs: 3.6 K/uL (ref 1.4–7.7)
Neutrophils Relative %: 57.2 % (ref 43.0–77.0)
Platelets: 291 K/uL (ref 150.0–400.0)
RBC: 5.05 Mil/uL (ref 3.87–5.11)
RDW: 13.5 % (ref 11.5–15.5)
WBC: 6.3 K/uL (ref 4.0–10.5)

## 2023-09-29 LAB — TESTOS,TOTAL,FREE AND SHBG (FEMALE)
Free Testosterone: 1.5 pg/mL (ref 0.1–6.4)
Sex Hormone Binding: 229 nmol/L — ABNORMAL HIGH (ref 17–124)
Testosterone, Total, LC-MS-MS: 34 ng/dL (ref 2–45)

## 2023-09-29 LAB — LIPASE: Lipase: 19 U/L (ref 11.0–59.0)

## 2023-09-29 LAB — PROLACTIN: Prolactin: 6.2 ng/mL

## 2023-09-29 NOTE — Progress Notes (Signed)
 Patient ID: Sarah Shah, female    DOB: 2000/01/14, 23 y.o.   MRN: [id number]  This visit was conducted in person.  BP 128/64   Pulse 98   Temp 98.7 F (37.1 C) (Oral)   Ht 5' 5 (1.651 m)   Wt (!) 314 lb 12.8 oz (142.8 kg)   SpO2 98%   BMI 52.39 kg/m    CC:  Chief Complaint  Patient presents with   Side Pan    Right side pain ongoing for about a week. Lower half of the right side   Diarrhea    Comes and go. Patient has not really eaten but when she does it becomes diarrhea. Since Saturday     Subjective:   HPI: Carle Dargan is a 23 y.o. female  patient of Ginger Patrick, NP with a history  Autism, PCOS, obesity, MDD, GERD of presenting on 09/29/2023 for Side Pan (Right side pain ongoing for about a week. Lower half of the right side) and Diarrhea (Comes and go. Patient has not really eaten but when she does it becomes diarrhea. Since Saturday )  Reviewed last PCP OV 09/02/2023  On Zepbound  for weight management   Presents with new onset right side pain x 1 week  Started after recent trip to [place]. Walked a lot, no new foods.  Started fatigue.  Pain is intermittent, worse after BM, occ relieved by BM.   Intermittent diarrhea , watery soft, 2 times daily for the last 4 days.  4 days ago threw up once.  No fever    Regular stool today.   No gallbladder.   Pain with going over bumps in car.   No urinary symptoms.   Has PCOS so no menses.  No sexually active.   Has history of mesenteric adenitis in past. This feels different.   Relevant past medical, surgical, family and social history reviewed and updated as indicated. Interim medical history since our last visit reviewed. Allergies and medications reviewed and updated. Outpatient Medications Prior to Visit  Medication Sig Dispense Refill   acetaminophen  (TYLENOL ) 325 MG tablet Take 650 mg by mouth every 6 (six) hours as needed.     Amphetamine  Sulfate 10 MG TABS Take 1 tablet by mouth daily as needed.      amphetamine -dextroamphetamine  (ADDERALL  XR) 30 MG 24 hr capsule Take 1 capsule (30 mg total) by mouth in the morning. 30 capsule 0   amphetamine -dextroamphetamine  (ADDERALL  XR) 30 MG 24 hr capsule Take 1 capsule (30 mg total) by mouth in the morning. 30 capsule 0   busPIRone  (BUSPAR ) 30 MG tablet Take 30 mg by mouth 2 (two) times daily.     cloNIDine  (CATAPRES ) 0.2 MG tablet Take 0.2 mg by mouth 2 (two) times daily.     cromolyn  (OPTICROM ) 4 % ophthalmic solution SMARTSIG:1 Drop(s) In Eye(s) Twice Daily PRN     desogestrel -ethinyl estradiol  (VOLNEA ) 0.15-0.02/0.01 MG (21/5) tablet TAKE 1 TABLET BY MOUTH EVERY DAY. TAKE ACTIVE TABLETS ONLY, SKIP PLACEBO TABLETS. 90 tablet 0   docusate sodium  (COLACE) 100 MG capsule Take 100 mg by mouth at bedtime.     famotidine  (PEPCID ) 20 MG tablet Take 20 mg by mouth daily.     ferrous sulfate  325 (65 FE) MG tablet Take 325 mg by mouth at bedtime.     omeprazole  (PRILOSEC) 40 MG capsule Take 1 capsule (40 mg total) by mouth daily. 90 capsule 4   RESTASIS  0.05 % ophthalmic emulsion 1 drop 2 (  two) times daily.     tirzepatide  (ZEPBOUND ) 7.5 MG/0.5ML Pen Inject 7.5 mg into the skin once a week. 2 mL 0   venlafaxine  XR (EFFEXOR -XR) 150 MG 24 hr capsule Take 150 mg by mouth daily.     venlafaxine  XR (EFFEXOR -XR) 75 MG 24 hr capsule Take 75 mg by mouth daily.     vitamin C  (ASCORBIC ACID ) 500 MG tablet Take 500 mg by mouth at bedtime.     brompheniramine-pseudoephedrine-DM 30-2-10 MG/5ML syrup Take 5 mLs by mouth 4 (four) times daily as needed. 240 mL 0   No facility-administered medications prior to visit.     Per HPI unless specifically indicated in ROS section below Review of Systems  Constitutional:  Negative for fatigue and fever.  HENT:  Negative for congestion.   Eyes:  Negative for pain.  Respiratory:  Negative for cough and shortness of breath.   Cardiovascular:  Negative for chest pain, palpitations and leg swelling.  Gastrointestinal:  Positive  for abdominal pain, diarrhea, nausea and vomiting.  Genitourinary:  Negative for dysuria and vaginal bleeding.  Musculoskeletal:  Negative for back pain.  Neurological:  Negative for syncope, light-headedness and headaches.  Psychiatric/Behavioral:  Negative for dysphoric mood.    Objective:  BP 128/64   Pulse 98   Temp 98.7 F (37.1 C) (Oral)   Ht 5' 5 (1.651 m)   Wt (!) 314 lb 12.8 oz (142.8 kg)   SpO2 98%   BMI 52.39 kg/m   Wt Readings from Last 3 Encounters:  09/30/23 (!) 320 lb 12.3 oz (145.5 kg)  09/30/23 (!) 316 lb 9.6 oz (143.6 kg)  09/29/23 (!) 314 lb 12.8 oz (142.8 kg)      Physical Exam Constitutional:      General: She is not in acute distress.    Appearance: Normal appearance. She is well-developed. She is not ill-appearing or toxic-appearing.  HENT:     Head: Normocephalic.     Right Ear: Hearing, tympanic membrane, ear canal and external ear normal. Tympanic membrane is not erythematous, retracted or bulging.     Left Ear: Hearing, tympanic membrane, ear canal and external ear normal. Tympanic membrane is not erythematous, retracted or bulging.     Nose: No mucosal edema or rhinorrhea.     Right Sinus: No maxillary sinus tenderness or frontal sinus tenderness.     Left Sinus: No maxillary sinus tenderness or frontal sinus tenderness.     Mouth/Throat:     Pharynx: Uvula midline.  Eyes:     General: Lids are normal. Lids are everted, no foreign bodies appreciated.     Conjunctiva/sclera: Conjunctivae normal.     Pupils: Pupils are equal, round, and reactive to light.  Neck:     Thyroid : No thyroid  mass or thyromegaly.     Vascular: No carotid bruit.     Trachea: Trachea normal.  Cardiovascular:     Rate and Rhythm: Normal rate and regular rhythm.     Pulses: Normal pulses.     Heart sounds: Normal heart sounds, S1 normal and S2 normal. No murmur heard.    No friction rub. No gallop.  Pulmonary:     Effort: Pulmonary effort is normal. No tachypnea or  respiratory distress.     Breath sounds: Normal breath sounds. No decreased breath sounds, wheezing, rhonchi or rales.  Abdominal:     General: Bowel sounds are normal.     Palpations: Abdomen is soft.     Tenderness: There is abdominal tenderness  in the right upper quadrant, right lower quadrant and epigastric area. There is no right CVA tenderness, left CVA tenderness, guarding or rebound. Negative signs include Murphy's sign, Rovsing's sign and obturator sign.     Hernia: No hernia is present. There is no hernia in the umbilical area, ventral area, left inguinal area or right inguinal area.  Musculoskeletal:     Cervical back: Normal range of motion and neck supple.  Skin:    General: Skin is warm and dry.     Findings: No rash.  Neurological:     Mental Status: She is alert.  Psychiatric:        Mood and Affect: Mood is not anxious or depressed.        Speech: Speech normal.        Behavior: Behavior normal. Behavior is cooperative.        Thought Content: Thought content normal.        Judgment: Judgment normal.       Results for orders placed or performed in visit on 09/29/23  POCT Urinalysis Dipstick (Automated)   Collection Time: 09/29/23 12:11 PM  Result Value Ref Range   Color, UA Light Yellow    Clarity, UA clear    Glucose, UA Negative Negative   Bilirubin, UA negative    Ketones, UA negative    Spec Grav, UA <=1.005 (A) 1.010 - 1.025   Blood, UA negative    pH, UA 6.0 5.0 - 8.0   Protein, UA Negative Negative   Urobilinogen, UA negative (A) 0.2 or 1.0 E.U./dL   Nitrite, UA negative    Leukocytes, UA Negative Negative  CBC with Differential/Platelet   Collection Time: 09/29/23 12:26 PM  Result Value Ref Range   WBC 6.3 4.0 - 10.5 K/uL   RBC 5.05 3.87 - 5.11 Mil/uL   Hemoglobin 14.1 12.0 - 15.0 g/dL   HCT 58.8 63.9 - 53.9 %   MCV 81.4 78.0 - 100.0 fl   MCHC 34.4 30.0 - 36.0 g/dL   RDW 86.4 88.4 - 84.4 %   Platelets 291.0 150.0 - 400.0 K/uL   Neutrophils  Relative % 57.2 43.0 - 77.0 %   Lymphocytes Relative 32.0 12.0 - 46.0 %   Monocytes Relative 9.5 3.0 - 12.0 %   Eosinophils Relative 0.6 0.0 - 5.0 %   Basophils Relative 0.7 0.0 - 3.0 %   Neutro Abs 3.6 1.4 - 7.7 K/uL   Lymphs Abs 2.0 0.7 - 4.0 K/uL   Monocytes Absolute 0.6 0.1 - 1.0 K/uL   Eosinophils Absolute 0.0 0.0 - 0.7 K/uL   Basophils Absolute 0.0 0.0 - 0.1 K/uL  Comprehensive metabolic panel with GFR   Collection Time: 09/29/23 12:26 PM  Result Value Ref Range   Sodium 139 135 - 145 mEq/L   Potassium 3.3 (L) 3.5 - 5.1 mEq/L   Chloride 106 96 - 112 mEq/L   CO2 21 19 - 32 mEq/L   Glucose, Bld 89 70 - 99 mg/dL   BUN 6 6 - 23 mg/dL   Creatinine, Ser 9.47 0.40 - 1.20 mg/dL   Total Bilirubin 0.2 0.2 - 1.2 mg/dL   Alkaline Phosphatase 85 39 - 117 U/L   AST 17 0 - 37 U/L   ALT 26 0 - 35 U/L   Total Protein 7.1 6.0 - 8.3 g/dL   Albumin 3.9 3.5 - 5.2 g/dL   GFR 867.72 >39.99 mL/min   Calcium 9.7 8.4 - 10.5 mg/dL  Lipase  Collection Time: 09/29/23 12:26 PM  Result Value Ref Range   Lipase 19.0 11.0 - 59.0 U/L    Assessment and Plan  Epigastric abdominal pain -     Lipase  Right sided abdominal pain -     CBC with Differential/Platelet -     Comprehensive metabolic panel with GFR -     POCT Urinalysis Dipstick (Automated)  Diarrhea, unspecified type -     Gastrointestinal Pathogen Pnl RT, PCR; Future   Unclear etiology of symptoms.  Urinalysis unremarkable.  Not sexually active.  Will evaluate with labs.  Hold Zepbound  until we rule out pancreatitis. Patient with history of mesenteric adenitis in the past.  Can consider abdominal CT to evaluate if labs unrevealing and pain worsening.  Return and ER precautions provided.  No follow-ups on file.   Greig Ring, MD

## 2023-09-29 NOTE — Patient Instructions (Addendum)
 Hold  Zepbound  until labs back.  Please stop at the lab to have labs drawn.

## 2023-09-30 ENCOUNTER — Other Ambulatory Visit: Payer: Self-pay

## 2023-09-30 ENCOUNTER — Ambulatory Visit: Payer: Self-pay | Admitting: Family Medicine

## 2023-09-30 ENCOUNTER — Ambulatory Visit (INDEPENDENT_AMBULATORY_CARE_PROVIDER_SITE_OTHER): Admitting: Sleep Medicine

## 2023-09-30 ENCOUNTER — Emergency Department

## 2023-09-30 ENCOUNTER — Encounter: Payer: Self-pay | Admitting: Sleep Medicine

## 2023-09-30 ENCOUNTER — Inpatient Hospital Stay
Admission: EM | Admit: 2023-09-30 | Discharge: 2023-10-02 | DRG: 872 | Disposition: A | Discharge status: Home / Self Care | Attending: Internal Medicine | Admitting: Internal Medicine

## 2023-09-30 VITALS — BP 110/72 | HR 110 | Temp 97.5°F | Ht 65.0 in | Wt 316.6 lb

## 2023-09-30 DIAGNOSIS — Z7985 Long-term (current) use of injectable non-insulin antidiabetic drugs: Secondary | ICD-10-CM | POA: Diagnosis not present

## 2023-09-30 DIAGNOSIS — Z818 Family history of other mental and behavioral disorders: Secondary | ICD-10-CM | POA: Diagnosis not present

## 2023-09-30 DIAGNOSIS — A419 Sepsis, unspecified organism: Secondary | ICD-10-CM | POA: Diagnosis not present

## 2023-09-30 DIAGNOSIS — F902 Attention-deficit hyperactivity disorder, combined type: Secondary | ICD-10-CM | POA: Diagnosis not present

## 2023-09-30 DIAGNOSIS — Z79899 Other long term (current) drug therapy: Secondary | ICD-10-CM | POA: Diagnosis not present

## 2023-09-30 DIAGNOSIS — Z1152 Encounter for screening for COVID-19: Secondary | ICD-10-CM

## 2023-09-30 DIAGNOSIS — E66813 Obesity, class 3: Secondary | ICD-10-CM | POA: Diagnosis present

## 2023-09-30 DIAGNOSIS — F840 Autistic disorder: Secondary | ICD-10-CM | POA: Diagnosis not present

## 2023-09-30 DIAGNOSIS — E782 Mixed hyperlipidemia: Secondary | ICD-10-CM | POA: Diagnosis present

## 2023-09-30 DIAGNOSIS — G4733 Obstructive sleep apnea (adult) (pediatric): Secondary | ICD-10-CM

## 2023-09-30 DIAGNOSIS — Z6841 Body Mass Index (BMI) 40.0 and over, adult: Secondary | ICD-10-CM

## 2023-09-30 DIAGNOSIS — Z833 Family history of diabetes mellitus: Secondary | ICD-10-CM | POA: Diagnosis not present

## 2023-09-30 DIAGNOSIS — F411 Generalized anxiety disorder: Secondary | ICD-10-CM | POA: Diagnosis present

## 2023-09-30 DIAGNOSIS — R1084 Generalized abdominal pain: Secondary | ICD-10-CM | POA: Diagnosis not present

## 2023-09-30 DIAGNOSIS — F913 Oppositional defiant disorder: Secondary | ICD-10-CM | POA: Diagnosis present

## 2023-09-30 DIAGNOSIS — K529 Noninfective gastroenteritis and colitis, unspecified: Secondary | ICD-10-CM | POA: Diagnosis present

## 2023-09-30 DIAGNOSIS — Z888 Allergy status to other drugs, medicaments and biological substances status: Secondary | ICD-10-CM | POA: Diagnosis not present

## 2023-09-30 DIAGNOSIS — D72829 Elevated white blood cell count, unspecified: Secondary | ICD-10-CM | POA: Diagnosis present

## 2023-09-30 DIAGNOSIS — Z91048 Other nonmedicinal substance allergy status: Secondary | ICD-10-CM | POA: Diagnosis not present

## 2023-09-30 DIAGNOSIS — K219 Gastro-esophageal reflux disease without esophagitis: Secondary | ICD-10-CM | POA: Diagnosis not present

## 2023-09-30 DIAGNOSIS — E282 Polycystic ovarian syndrome: Secondary | ICD-10-CM | POA: Diagnosis present

## 2023-09-30 DIAGNOSIS — Z8249 Family history of ischemic heart disease and other diseases of the circulatory system: Secondary | ICD-10-CM | POA: Diagnosis not present

## 2023-09-30 DIAGNOSIS — F332 Major depressive disorder, recurrent severe without psychotic features: Secondary | ICD-10-CM | POA: Diagnosis present

## 2023-09-30 DIAGNOSIS — E876 Hypokalemia: Principal | ICD-10-CM | POA: Diagnosis present

## 2023-09-30 DIAGNOSIS — G4710 Hypersomnia, unspecified: Secondary | ICD-10-CM | POA: Diagnosis not present

## 2023-09-30 LAB — URINALYSIS, ROUTINE W REFLEX MICROSCOPIC
Bilirubin Urine: NEGATIVE
Glucose, UA: NEGATIVE mg/dL
Ketones, ur: NEGATIVE mg/dL
Nitrite: NEGATIVE
Protein, ur: NEGATIVE mg/dL
Specific Gravity, Urine: 1.001 — ABNORMAL LOW (ref 1.005–1.030)
pH: 6 (ref 5.0–8.0)

## 2023-09-30 LAB — COMPREHENSIVE METABOLIC PANEL WITH GFR
ALT: 25 U/L (ref 0–44)
AST: 18 U/L (ref 15–41)
Albumin: 3.8 g/dL (ref 3.5–5.0)
Alkaline Phosphatase: 93 U/L (ref 38–126)
Anion gap: 14 (ref 5–15)
BUN: 6 mg/dL (ref 6–20)
CO2: 20 mmol/L — ABNORMAL LOW (ref 22–32)
Calcium: 9.5 mg/dL (ref 8.9–10.3)
Chloride: 101 mmol/L (ref 98–111)
Creatinine, Ser: 0.48 mg/dL (ref 0.44–1.00)
GFR, Estimated: >60 mL/min (ref >60)
Glucose, Bld: 81 mg/dL (ref 70–99)
Potassium: 2.9 mmol/L — ABNORMAL LOW (ref 3.5–5.1)
Sodium: 135 mmol/L (ref 135–145)
Total Bilirubin: 0.4 mg/dL (ref 0.0–1.2)
Total Protein: 7.5 g/dL (ref 6.5–8.1)

## 2023-09-30 LAB — MAGNESIUM: Magnesium: 1.5 mg/dL — ABNORMAL LOW (ref 1.7–2.4)

## 2023-09-30 LAB — LIPASE, BLOOD: Lipase: 36 U/L (ref 11–51)

## 2023-09-30 LAB — RESP PANEL BY RT-PCR (RSV, FLU A&B, COVID)  RVPGX2
Influenza A by PCR: NEGATIVE
Influenza B by PCR: NEGATIVE
Resp Syncytial Virus by PCR: NEGATIVE
SARS Coronavirus 2 by RT PCR: NEGATIVE

## 2023-09-30 LAB — CBC
HCT: 43.5 % (ref 36.0–46.0)
Hemoglobin: 15.1 g/dL — ABNORMAL HIGH (ref 12.0–15.0)
MCH: 28.5 pg (ref 26.0–34.0)
MCHC: 34.7 g/dL (ref 30.0–36.0)
MCV: 82.1 fL (ref 80.0–100.0)
Platelets: 254 K/uL (ref 150–400)
RBC: 5.3 MIL/uL — ABNORMAL HIGH (ref 3.87–5.11)
RDW: 12.1 % (ref 11.5–15.5)
WBC: 18.3 K/uL — ABNORMAL HIGH (ref 4.0–10.5)
nRBC: 0 % (ref 0.0–0.2)

## 2023-09-30 LAB — POC URINE PREG, ED: Preg Test, Ur: NEGATIVE

## 2023-09-30 LAB — LACTIC ACID, PLASMA: Lactic Acid, Venous: 1.1 mmol/L (ref 0.5–1.9)

## 2023-09-30 MED ORDER — PANTOPRAZOLE SODIUM 40 MG PO TBEC
40.0000 mg | DELAYED_RELEASE_TABLET | Freq: Every day | ORAL | Status: DC
  Administered 2023-10-01 – 2023-10-02 (×2): 40 mg via ORAL
  Filled 2023-09-30 (×2): qty 1

## 2023-09-30 MED ORDER — LACTATED RINGERS IV SOLN
150.0000 mL/h | INTRAVENOUS | Status: DC
  Administered 2023-10-01 (×2): 150 mL/h via INTRAVENOUS

## 2023-09-30 MED ORDER — FERROUS SULFATE 325 (65 FE) MG PO TABS
325.0000 mg | ORAL_TABLET | Freq: Every day | ORAL | Status: DC
  Administered 2023-10-01: 325 mg via ORAL
  Filled 2023-09-30: qty 1

## 2023-09-30 MED ORDER — VENLAFAXINE HCL ER 75 MG PO CP24
225.0000 mg | ORAL_CAPSULE | Freq: Every day | ORAL | Status: DC
Start: 2023-10-01 — End: 2023-10-02
  Administered 2023-10-01 – 2023-10-02 (×2): 225 mg via ORAL
  Filled 2023-09-30 (×2): qty 3

## 2023-09-30 MED ORDER — ONDANSETRON HCL 4 MG/2ML IJ SOLN
4.0000 mg | Freq: Once | INTRAMUSCULAR | Status: AC
  Administered 2023-09-30: 4 mg via INTRAVENOUS
  Filled 2023-09-30: qty 2

## 2023-09-30 MED ORDER — DESOGESTREL-ETHINYL ESTRADIOL 0.15-0.02/0.01 MG (21/5) PO TABS: 1.0000 | ORAL_TABLET | Freq: Every day | ORAL | Status: DC

## 2023-09-30 MED ORDER — ACETAMINOPHEN 325 MG PO TABS: 650.0000 mg | ORAL_TABLET | Freq: Four times a day (QID) | ORAL | Status: DC | PRN

## 2023-09-30 MED ORDER — MORPHINE SULFATE (PF) 2 MG/ML IV SOLN: 2.0000 mg | INTRAVENOUS | Status: DC | PRN

## 2023-09-30 MED ORDER — VITAMIN C 500 MG PO TABS
500.0000 mg | ORAL_TABLET | Freq: Every day | ORAL | Status: DC
  Administered 2023-10-01: 500 mg via ORAL
  Filled 2023-09-30: qty 1

## 2023-09-30 MED ORDER — BUSPIRONE HCL 15 MG PO TABS
30.0000 mg | ORAL_TABLET | Freq: Two times a day (BID) | ORAL | Status: DC
Start: 2023-09-30 — End: 2023-10-02
  Administered 2023-10-01 – 2023-10-02 (×4): 30 mg via ORAL
  Filled 2023-09-30: qty 6
  Filled 2023-09-30 (×4): qty 2

## 2023-09-30 MED ORDER — MAGNESIUM SULFATE 2 GM/50ML IV SOLN
2.0000 g | Freq: Once | INTRAVENOUS | Status: AC
  Administered 2023-09-30: 2 g via INTRAVENOUS
  Filled 2023-09-30: qty 50

## 2023-09-30 MED ORDER — KETOROLAC TROMETHAMINE 15 MG/ML IJ SOLN
15.0000 mg | Freq: Once | INTRAMUSCULAR | Status: AC
  Administered 2023-09-30: 15 mg via INTRAVENOUS
  Filled 2023-09-30: qty 1

## 2023-09-30 MED ORDER — ONDANSETRON HCL 4 MG PO TABS: 4.0000 mg | ORAL_TABLET | Freq: Four times a day (QID) | ORAL | Status: DC | PRN

## 2023-09-30 MED ORDER — POTASSIUM CHLORIDE CRYS ER 20 MEQ PO TBCR
40.0000 meq | EXTENDED_RELEASE_TABLET | Freq: Two times a day (BID) | ORAL | Status: DC
  Administered 2023-10-01: 40 meq via ORAL
  Filled 2023-09-30: qty 2

## 2023-09-30 MED ORDER — ACETAMINOPHEN 325 MG PO TABS
650.0000 mg | ORAL_TABLET | Freq: Once | ORAL | Status: AC
Start: 2023-09-30 — End: 2023-09-30
  Administered 2023-09-30: 650 mg via ORAL
  Filled 2023-09-30: qty 2

## 2023-09-30 MED ORDER — ACETAMINOPHEN 650 MG RE SUPP: 650.0000 mg | Freq: Four times a day (QID) | RECTAL | Status: DC | PRN

## 2023-09-30 MED ORDER — ENOXAPARIN SODIUM 80 MG/0.8ML IJ SOSY
70.0000 mg | PREFILLED_SYRINGE | INTRAMUSCULAR | Status: DC
  Filled 2023-09-30 (×2): qty 0.7

## 2023-09-30 MED ORDER — CLONIDINE HCL 0.1 MG PO TABS
0.2000 mg | ORAL_TABLET | Freq: Two times a day (BID) | ORAL | Status: DC
Start: 2023-09-30 — End: 2023-10-01
  Filled 2023-09-30: qty 2

## 2023-09-30 MED ORDER — CYCLOSPORINE 0.05 % OP EMUL
1.0000 [drp] | Freq: Two times a day (BID) | OPHTHALMIC | Status: DC
  Administered 2023-10-01 – 2023-10-02 (×3): 1 [drp] via OPHTHALMIC
  Filled 2023-09-30 (×4): qty 30

## 2023-09-30 MED ORDER — SODIUM CHLORIDE 0.9 % IV SOLN
2.0000 g | INTRAVENOUS | Status: DC
  Administered 2023-10-01: 2 g via INTRAVENOUS
  Filled 2023-09-30: qty 20

## 2023-09-30 MED ORDER — MORPHINE SULFATE (PF) 4 MG/ML IV SOLN
4.0000 mg | Freq: Once | INTRAVENOUS | Status: AC
  Administered 2023-09-30: 4 mg via INTRAVENOUS
  Filled 2023-09-30: qty 1

## 2023-09-30 MED ORDER — METRONIDAZOLE 500 MG/100ML IV SOLN
500.0000 mg | Freq: Two times a day (BID) | INTRAVENOUS | Status: DC
  Administered 2023-10-01 – 2023-10-02 (×3): 500 mg via INTRAVENOUS
  Filled 2023-09-30 (×3): qty 100

## 2023-09-30 MED ORDER — VENLAFAXINE HCL ER 150 MG PO CP24: 150.0000 mg | ORAL_CAPSULE | Freq: Every day | ORAL | Status: DC

## 2023-09-30 MED ORDER — FAMOTIDINE 20 MG PO TABS
20.0000 mg | ORAL_TABLET | Freq: Every day | ORAL | Status: DC
  Administered 2023-10-01 – 2023-10-02 (×2): 20 mg via ORAL
  Filled 2023-09-30 (×2): qty 1

## 2023-09-30 MED ORDER — ONDANSETRON HCL 4 MG/2ML IJ SOLN: 4.0000 mg | Freq: Four times a day (QID) | INTRAMUSCULAR | Status: DC | PRN

## 2023-09-30 MED ORDER — DOCUSATE SODIUM 100 MG PO CAPS
100.0000 mg | ORAL_CAPSULE | Freq: Every day | ORAL | Status: DC
  Administered 2023-10-01: 100 mg via ORAL
  Filled 2023-09-30: qty 1

## 2023-09-30 MED ORDER — METRONIDAZOLE 500 MG/100ML IV SOLN
500.0000 mg | Freq: Once | INTRAVENOUS | Status: AC
  Administered 2023-09-30: 500 mg via INTRAVENOUS
  Filled 2023-09-30: qty 100

## 2023-09-30 MED ORDER — CROMOLYN SODIUM 4 % OP SOLN: 1.0000 [drp] | Freq: Two times a day (BID) | OPHTHALMIC | Status: DC | PRN

## 2023-09-30 MED ORDER — SODIUM CHLORIDE 0.9 % IV BOLUS
1000.0000 mL | Freq: Once | INTRAVENOUS | Status: AC
  Administered 2023-09-30: 1000 mL via INTRAVENOUS

## 2023-09-30 MED ORDER — AMPHETAMINE-DEXTROAMPHET ER 30 MG PO CP24
30.0000 mg | ORAL_CAPSULE | Freq: Every day | ORAL | Status: DC
  Administered 2023-10-01 – 2023-10-02 (×2): 30 mg via ORAL
  Filled 2023-09-30 (×2): qty 1

## 2023-09-30 MED ORDER — POTASSIUM CHLORIDE CRYS ER 20 MEQ PO TBCR
40.0000 meq | EXTENDED_RELEASE_TABLET | Freq: Once | ORAL | Status: DC
  Filled 2023-09-30: qty 2

## 2023-09-30 MED ORDER — AMPHETAMINE-DEXTROAMPHET ER 5 MG PO CP24: 30.0000 mg | ORAL_CAPSULE | Freq: Every morning | ORAL | Status: DC

## 2023-09-30 MED ORDER — SODIUM CHLORIDE 0.9 % IV SOLN
2.0000 g | Freq: Once | INTRAVENOUS | Status: AC
  Administered 2023-09-30: 2 g via INTRAVENOUS
  Filled 2023-09-30: qty 20

## 2023-09-30 MED ORDER — IOHEXOL 350 MG/ML SOLN
100.0000 mL | Freq: Once | INTRAVENOUS | Status: AC | PRN
Start: 2023-09-30 — End: 2023-09-30
  Administered 2023-09-30: 100 mL via INTRAVENOUS

## 2023-09-30 MED ORDER — ACETAMINOPHEN 325 MG PO TABS
650.0000 mg | ORAL_TABLET | Freq: Four times a day (QID) | ORAL | Status: DC | PRN
Start: 2023-09-30 — End: 2023-09-30

## 2023-09-30 NOTE — Progress Notes (Signed)
 PHARMACIST - PHYSICIAN COMMUNICATION  CONCERNING:  Enoxaparin  (Lovenox ) for DVT Prophylaxis    RECOMMENDATION: Patient was prescribed enoxaprin 40mg  q24 hours for VTE prophylaxis.   Filed Weights   09/30/23 1655  Weight: (!) 143.3 kg (316 lb)    Body mass index is 52.59 kg/m.  Estimated Creatinine Clearance: 159.3 mL/min (by C-G formula based on SCr of 0.48 mg/dL).   Based on [facility] policy patient is candidate for enoxaparin  0.5mg /kg TBW SQ every 24 hours based on BMI being >30.  DESCRIPTION: Pharmacy has adjusted enoxaparin  dose per [facility] policy.  Patient is now receiving enoxaparin  0.5 mg/kg every 24 hours   Rankin CANDIE Dills, PharmD, [facility] 09/30/2023 10:49 PM

## 2023-09-30 NOTE — ED Triage Notes (Signed)
 Patient states lower abdominal pain, N/V/D for 5 days; had negative blood work and urinalysis yesterday at PCP.

## 2023-09-30 NOTE — ED Provider Notes (Signed)
 [facility] Provider Note    Event Date/Time   First MD Initiated Contact with Patient 09/30/23 1753     (approximate)   History   Abdominal Pain   HPI  Sarah Shah is a 23 y.o. female with PMH of autism, ODD, ADHD, PCOS, development delay, constipation presents for evaluation of abdominal pain.  Patient's mother who is in the room with patient feels knee and on history.  Her symptoms began about 5 days ago.  She has had worsening abdominal pain with some associated nausea, vomiting and diarrhea.  No urinary symptoms or fevers.  Patient was seen by her primary care provider yesterday and had some blood work done.  Presents to the ED today as her pain has continued to worsen.  Patient also reported chest pain while waiting in the waiting room.      Physical Exam   Triage Vital Signs: ED Triage Vitals [09/30/23 1655]  Encounter Vitals Group     BP 122/85     Girls Systolic BP Percentile      Girls Diastolic BP Percentile      Boys Systolic BP Percentile      Boys Diastolic BP Percentile      Pulse Rate (!) 110     Resp 18     Temp 98.3 F (36.8 C)     Temp Source Oral     SpO2 99 %     Weight (!) 316 lb (143.3 kg)     Height 5' 5 (1.651 m)     Head Circumference      Peak Flow      Pain Score 10     Pain Loc      Pain Education      Exclude from Growth Chart     Most recent vital signs: Vitals:   09/30/23 2021 09/30/23 2031  BP: 102/69 (!) 107/57  Pulse: (!) 116   Resp: 18   Temp: 99.9 F (37.7 C) 99.7 F (37.6 C)  SpO2: 100%    General: Awake, moaning and crying in pain. CV:  Good peripheral perfusion. RRR. Resp:  Normal effort. CTAB. Abd:  No distention. Soft, tender to palpation throughout but most pain in RLQ. Other:     ED Results / Procedures / Treatments   Labs (all labs ordered are listed, but only abnormal results are displayed) Labs Reviewed  COMPREHENSIVE METABOLIC PANEL WITH GFR - Abnormal; Notable for the  following components:      Result Value   Potassium 2.9 (*)    CO2 20 (*)    All other components within normal limits  CBC - Abnormal; Notable for the following components:   WBC 18.3 (*)    RBC 5.30 (*)    Hemoglobin 15.1 (*)    All other components within normal limits  URINALYSIS, ROUTINE W REFLEX MICROSCOPIC - Abnormal; Notable for the following components:   Color, Urine COLORLESS (*)    APPearance CLEAR (*)    Specific Gravity, Urine 1.001 (*)    Hgb urine dipstick LARGE (*)    Leukocytes,Ua TRACE (*)    Bacteria, UA RARE (*)    All other components within normal limits  MAGNESIUM  - Abnormal; Notable for the following components:   Magnesium  1.5 (*)    All other components within normal limits  CULTURE, BLOOD (ROUTINE X 2)  CULTURE, BLOOD (ROUTINE X 2)  RESP PANEL BY RT-PCR (RSV, FLU A&B, COVID)  RVPGX2  LIPASE, BLOOD  LACTIC ACID, PLASMA  LACTIC ACID, PLASMA  HIV ANTIBODY (ROUTINE TESTING W REFLEX)  PROTIME-INR  CORTISOL-AM, BLOOD  CBC  CREATININE, SERUM  COMPREHENSIVE METABOLIC PANEL WITH GFR  CBC  POC URINE PREG, ED  TROPONIN I (HIGH SENSITIVITY)     EKG  ED provider interpretation: Sinus tachycardia  Vent. rate 115 BPM PR interval 158 ms QRS duration 74 ms QT/QTcB 330/456 ms P-R-T axes 49 34 33   RADIOLOGY  CT abdomen pelvis obtained, interpreted the images as well as reviewed the radiologist report. See ED course for interpretation.  PROCEDURES:  Critical Care performed: No  Procedures   MEDICATIONS ORDERED IN ED: Medications  potassium chloride  SA (KLOR-CON  M) CR tablet 40 mEq (40 mEq Oral Not Given 09/30/23 1939)  metroNIDAZOLE  (FLAGYL ) IVPB 500 mg (500 mg Intravenous New Bag/Given 09/30/23 2210)  ferrous sulfate  tablet 325 mg (has no administration in time range)  ascorbic acid  (VITAMIN C ) tablet 500 mg (has no administration in time range)  docusate sodium  (COLACE) capsule 100 mg (has no administration in time range)  busPIRone   (BUSPAR ) tablet 30 mg (has no administration in time range)  cloNIDine  (CATAPRES ) tablet 0.2 mg (has no administration in time range)  venlafaxine  XR (EFFEXOR -XR) 24 hr capsule 150 mg (has no administration in time range)  venlafaxine  XR (EFFEXOR -XR) 24 hr capsule 75 mg (has no administration in time range)  famotidine  (PEPCID ) tablet 20 mg (has no administration in time range)  desogestrel -ethinyl estradiol  (MIRCETTE ) 0.15-0.02/0.01 MG (21/5) per tablet 1 tablet (has no administration in time range)  acetaminophen  (TYLENOL ) tablet 650 mg (has no administration in time range)  cromolyn  (OPTICROM ) 4 % ophthalmic solution 1 drop (has no administration in time range)  cycloSPORINE  (RESTASIS ) 0.05 % ophthalmic emulsion 1 drop (has no administration in time range)  amphetamine -dextroamphetamine  (ADDERALL  XR) 24 hr capsule 30 mg (has no administration in time range)  amphetamine -dextroamphetamine  (ADDERALL  XR) 24 hr capsule 30 mg (has no administration in time range)  pantoprazole  (PROTONIX ) EC tablet 40 mg (has no administration in time range)  lactated ringers  infusion (has no administration in time range)  enoxaparin  (LOVENOX ) injection 70 mg (has no administration in time range)  cefTRIAXone  (ROCEPHIN ) 2 g in sodium chloride  0.9 % 100 mL IVPB (has no administration in time range)  metroNIDAZOLE  (FLAGYL ) IVPB 500 mg (has no administration in time range)  ondansetron  (ZOFRAN ) tablet 4 mg (has no administration in time range)    Or  ondansetron  (ZOFRAN ) injection 4 mg (has no administration in time range)  acetaminophen  (TYLENOL ) tablet 650 mg (has no administration in time range)    Or  acetaminophen  (TYLENOL ) suppository 650 mg (has no administration in time range)  morphine  (PF) 2 MG/ML injection 2 mg (has no administration in time range)  potassium chloride  SA (KLOR-CON  M) CR tablet 40 mEq (has no administration in time range)  sodium chloride  0.9 % bolus 1,000 mL (1,000 mLs Intravenous New  Bag/Given 09/30/23 1938)  ketorolac  (TORADOL ) 15 MG/ML injection 15 mg (15 mg Intravenous Given 09/30/23 1939)  magnesium  sulfate IVPB 2 g 50 mL (2 g Intravenous New Bag/Given 09/30/23 1938)  iohexol  (OMNIPAQUE ) 350 MG/ML injection 100 mL (100 mLs Intravenous Contrast Given 09/30/23 1946)  morphine  (PF) 4 MG/ML injection 4 mg (4 mg Intravenous Given 09/30/23 2114)  acetaminophen  (TYLENOL ) tablet 650 mg (650 mg Oral Given 09/30/23 2044)  ondansetron  (ZOFRAN ) injection 4 mg (4 mg Intravenous Given 09/30/23 2117)  cefTRIAXone  (ROCEPHIN ) 2 g in sodium chloride  0.9 % 100 mL  IVPB (2 g Intravenous New Bag/Given 09/30/23 2207)     IMPRESSION / MDM / ASSESSMENT AND PLAN / ED COURSE  I reviewed the triage vital signs and the nursing notes.                             23 year old female presents for evaluation of abdominal pain.  Patient is tachycardic otherwise vital signs are stable.  Patient is very uncomfortable on exam.  Patient's mother giving history as patient is in too much pain to speak.  Differential diagnosis includes, but is not limited to, ovarian cyst, ovarian torsion, acute appendicitis, diverticulitis, urinary tract infection/pyelonephritis, endometriosis, bowel obstruction, colitis, renal colic, gastroenteritis, hernia, fibroids, endometriosis, pregnancy related pain including ectopic pregnancy, etc.  Patient's presentation is most consistent with acute complicated illness / injury requiring diagnostic workup.  Will obtain labs and UA.  Given patient's level of pain and tenderness throughout the abdomen on physical exam we will plan on getting CT imaging.  Patient is tachycardic on my initial assessment which may be due to pain, infection or dehydration.  Will start IV fluids.  EKG obtained from triage as patient reported chest pain.  EKG shows sinus tachycardia.  Troponin is pending.  Clinical Course as of 09/30/23 2309  Thu Sep 30, 2023  1835 CBC(!) Leukocytosis at 18.3 [LD]  1837 POC  urine preg, ED Negative. [LD]  1837 Urinalysis, Routine w reflex microscopic -Urine, Clean Catch(!) Large hemoglobin, trace leukocytes, no 0-5 WBCs and rare bacteria.  Patient reported some vaginal bleeding when she gave the urine sample so I suspect this is why there is a hemoglobin rather than a kidney stone. [LD]  1840 Comprehensive metabolic panel(!) Hypokalemia, will check magnesium  level and give supplemental potassium. [LD]  1842 Lipase, blood Within normal limits, low suspicion for pancreatitis. [LD]  1924 Magnesium (!) Low magnesium , will replete with IV mag. [LD]  2011 CT ABDOMEN PELVIS W CONTRAST CT abdomen pelvis shows mesenteric lymph nodes.  Given patient's history of nausea, vomiting and diarrhea suspect bacterial gastroenteritis.   IMPRESSION: 1. Subcentimeter right lower quadrant mesenteric lymph nodes, likely reactive. This could reflect sequela of mesenteric adenitis or gastroenteritis. 2. Normal appendix.   [LD]  2025 Nursing staff informed me patient's IV was taken out at Ct, placed another IV consult as patient has not received any of the fluids and vomited up the potassium. [LD]  2041 Nursing staff informed me that patient's BP is a little soft, still waiting for IV so we can give fluids which should improve both her pressure and heart rate. Also has a low grade fever so will give some tylenol  and zofran  for nausea. [LD]  2100 Do not feel the patient is stable for outpatient management given her electrolyte abnormalities.  Sepsis order set initiated by my attending physician.  Will place consult for admission. [LD]  2125 Hospitalist agreeable to admit [LD]  2308 Patient reassessed, remains stable, updated on plan.  [LD]    Clinical Course User Index [LD] Cleaster Tinnie LABOR, PA-C      FINAL CLINICAL IMPRESSION(S) / ED DIAGNOSES   Final diagnoses:  Hypokalemia  Hypomagnesemia  Generalized abdominal pain     Rx / DC Orders   ED Discharge Orders      None        Note:  This document was prepared using Dragon voice recognition software and may include unintentional dictation errors.   Cleaster Tinnie LABOR, PA-C 09/30/23  [id number]    Willo Dunnings, MD 09/30/23 2315

## 2023-09-30 NOTE — H&P (Signed)
 History and Physical    Patient: Sarah Shah FMW:[id number] DOB: 2000-07-23 DOA: 09/30/2023 DOS: the patient was seen and examined on 09/30/2023 PCP: Corwin Antu, FNP  Patient coming from: Home  Chief Complaint:  Chief Complaint  Patient presents with   Abdominal Pain   HPI: Sarah Shah is a 23 y.o. female with medical history significant of ADHD, oppositional defiant disorder, depression, iron deficiency anemia PCOS GERD, disability, autism spectrum disorder who presented to the ER with abdominal pain nausea vomiting diarrhea.  Symptoms have been going on for couple of days.  Patient has had some diarrhea for a number of days until yesterday.  Patient was complaining of left lower quadrant pain and tenderness.  She also has some vomiting.  Patient was seen by her PCP yesterday where she had workup done including blood work and urinalysis which was all negative.  Patient meets sepsis criteria today with CT showing possibilities of acute enteritis and possible adenitis.  Suspected viral cause also.  Patient is being admitted sepsis of unknown cause.  Review of Systems: As mentioned in the history of present illness. All other systems reviewed and are negative. Past Medical History:  Diagnosis Date   ADHD (attention deficit hyperactivity disorder)    Anemia    Anxiety    Autism spectrum    Constipated    Constipation    Depression    Development delay    GERD (gastroesophageal reflux disease)    Headache    Insulin resistance    Iron deficiency    Learning disability    Obesity    ODD (oppositional defiant disorder)    PCOS (polycystic ovarian syndrome)    PMDD (premenstrual dysphoric disorder)    Pneumonia    3 mos old and 14 mos old   Visual acuity reduced    glasses   Past Surgical History:  Procedure Laterality Date   BIOPSY  02/17/2021   Procedure: BIOPSY;  Surgeon: Abran Norleen SAILOR, MD;  Location: [facility];  Service: Endoscopy;;   CHOLECYSTECTOMY   09/17/2021   COLONOSCOPY WITH PROPOFOL  N/A 02/17/2021   Procedure: COLONOSCOPY WITH PROPOFOL ;  Surgeon: Abran Norleen SAILOR, MD;  Location: [facility];  Service: Endoscopy;  Laterality: N/A;   ESOPHAGOGASTRODUODENOSCOPY (EGD) WITH PROPOFOL  N/A 02/17/2021   Procedure: ESOPHAGOGASTRODUODENOSCOPY (EGD) WITH PROPOFOL ;  Surgeon: Abran Norleen SAILOR, MD;  Location: [facility];  Service: Endoscopy;  Laterality: N/A;   TOOTH EXTRACTION N/A 01/03/2018   Procedure: SURGICAL EXTRACTION OF TEETH #1, 16, 17, 32;  Surgeon: Joanette Soulier, DMD;  Location: [facility];  Service: Oral Surgery;  Laterality: N/A;   Social History:  reports that she has never smoked. She has been exposed to tobacco smoke. She has never used smokeless tobacco. She reports that she does not drink alcohol and does not use drugs.  Allergies  Allergen Reactions   Wound Dressing Adhesive Itching    Itchy and irritated.   Semaglutide  Nausea And Vomiting    Family History  Problem Relation Age of Onset   Diabetes Mother    Hypertension Mother    Anxiety disorder Mother    Other Mother        Premenstrual dysphoic disorder   Obesity Mother    Stroke Mother    Colon polyps Mother    Irritable bowel syndrome Mother    Diabetes Father        type 1   Depression Father    Anxiety disorder Father    Obesity Father  Anxiety disorder Sister    ADD / ADHD Sister        ADD   Other Sister        Premenstrual dysphoric disorder   Atrial fibrillation Maternal Grandmother    Diabetes Maternal Grandmother    Heart failure Maternal Grandmother    Hypertension Maternal Grandfather    Iron deficiency Maternal Aunt    Breast cancer Maternal Aunt     Prior to Admission medications   Medication Sig Start Date End Date Taking? Authorizing Provider  acetaminophen  (TYLENOL ) 325 MG tablet Take 650 mg by mouth every 6 (six) hours as needed.   Yes [provider]  Amphetamine  Sulfate 10 MG TABS Take 1 tablet by mouth daily as needed. 01/29/23   Yes [provider]  amphetamine -dextroamphetamine  (ADDERALL  XR) 30 MG 24 hr capsule Take 1 capsule (30 mg total) by mouth in the morning. 06/21/23  Yes   amphetamine -dextroamphetamine  (ADDERALL  XR) 30 MG 24 hr capsule Take 1 capsule (30 mg total) by mouth in the morning. 07/21/23  Yes   busPIRone  (BUSPAR ) 30 MG tablet Take 30 mg by mouth 2 (two) times daily.   Yes [provider]  cloNIDine  (CATAPRES ) 0.2 MG tablet Take 0.2 mg by mouth 2 (two) times daily. 06/23/22  Yes [provider]  cromolyn  (OPTICROM ) 4 % ophthalmic solution SMARTSIG:1 Drop(s) In Eye(s) Twice Daily PRN 06/02/23  Yes [provider]  docusate sodium  (COLACE) 100 MG capsule Take 100 mg by mouth at bedtime.   Yes [provider]  famotidine  (PEPCID ) 20 MG tablet Take 20 mg by mouth daily.   Yes [provider]  ferrous sulfate  325 (65 FE) MG tablet Take 325 mg by mouth at bedtime.   Yes [provider]  omeprazole  (PRILOSEC) 40 MG capsule Take 1 capsule (40 mg total) by mouth daily. 07/15/22  Yes Nandigam, [name] V, MD  RESTASIS  0.05 % ophthalmic emulsion 1 drop 2 (two) times daily. 06/02/23  Yes [provider]  tirzepatide  (ZEPBOUND ) 7.5 MG/0.5ML Pen Inject 7.5 mg into the skin once a week. 09/23/23  Yes Dugal, Tabitha, FNP  venlafaxine  XR (EFFEXOR -XR) 150 MG 24 hr capsule Take 150 mg by mouth daily. 07/15/22  Yes [provider]  venlafaxine  XR (EFFEXOR -XR) 75 MG 24 hr capsule Take 75 mg by mouth daily. 09/11/22  Yes [provider]  vitamin C  (ASCORBIC ACID ) 500 MG tablet Take 500 mg by mouth at bedtime.   Yes [provider]  desogestrel -ethinyl estradiol  (VOLNEA ) 0.15-0.02/0.01 MG (21/5) tablet TAKE 1 TABLET BY MOUTH EVERY DAY. TAKE ACTIVE TABLETS ONLY, SKIP PLACEBO TABLETS. 01/17/23   Corwin Antu, FNP    Physical Exam: Vitals:   09/30/23 1655 09/30/23 1808 09/30/23 2021 09/30/23 2031  BP: 122/85  102/69 (!) 107/57  Pulse: (!) 110   (!) 116   Resp: 18  18   Temp: 98.3 F (36.8 C)  99.9 F (37.7 C) 99.7 F (37.6 C)  TempSrc: Oral  Oral Oral  SpO2: 99% 99% 100%   Weight: (!) 143.3 kg     Height: 5' 5 (1.651 m)      Constitutional: Acutely ill looking, morbid obesity NAD, calm, comfortable Eyes: PERRL, lids and conjunctivae normal ENMT: Mucous membranes are dry posterior pharynx clear of any exudate or lesions.Normal dentition.  Neck: normal, supple, no masses, no thyromegaly Respiratory: clear to auscultation bilaterally, no wheezing, no crackles. Normal respiratory effort. No accessory muscle use.  Cardiovascular: Sinus tachycardia, no murmurs / rubs /  gallops. No extremity edema. 2+ pedal pulses. No carotid bruits.  Abdomen: Diffuse abdominal tenderness no masses palpated. No hepatosplenomegaly. Bowel sounds positive.  Musculoskeletal: Good range of motion, no joint swelling or tenderness, Skin: no rashes, lesions, ulcers. No induration Neurologic: CN 2-12 grossly intact. Sensation intact, DTR normal. Strength 5/5 in all 4.  Psychiatric: Normal judgment and insight. Alert and oriented x 3. Normal mood  Data Reviewed:  Temperature 97.5, blood pressure 122/69, pulse 116, respirate 18, white count 18.3 hemoglobin 15.1.  Potassium 2.9 CO2 20 magnesium  1.5 urinalysis essentially negative.  CT abdomen pelvis shows subcentimeter right lower quadrant mesenteric lymph nodes likely reactive probably follow-up mesenteric adenitis or gastroenteritis  Assessment and Plan:  #1 sepsis secondary to possible GI source: Patient appears to have mesenteric adenitis and possible gastroenteritis.  Patient will be initiated on IV antibiotics.  Sepsis protocol and IV fluids.  Continue supportive care.  #2 oppositional defiant disorder: Continue home regimen  #3 GERD: Continue PPIs  #4 hypomagnesemia: Replete magnesium   #5 hypokalemia: Continue to replete potassium  #6 ADHD: Continue home regimen  #7 mixed hyperlipidemia:  Continue with statin    Advance Care Planning:   Code Status: Full Code   Consults: None  Family Communication: Mother at bedside  Severity of Illness: The appropriate patient status for this patient is INPATIENT. Inpatient status is judged to be reasonable and necessary in order to provide the required intensity of service to ensure the patient's safety. The patient's presenting symptoms, physical exam findings, and initial radiographic and laboratory data in the context of their chronic comorbidities is felt to place them at high risk for further clinical deterioration. Furthermore, it is not anticipated that the patient will be medically stable for discharge from the hospital within 2 midnights of admission.   * I certify that at the point of admission it is my clinical judgment that the patient will require inpatient hospital care spanning beyond 2 midnights from the point of admission due to high intensity of service, high risk for further deterioration and high frequency of surveillance required.*  AuthorBETHA SIM KNOLL, MD 09/30/2023 10:44 PM  For on call review [email or web address].

## 2023-09-30 NOTE — ED Notes (Signed)
 Patient transported to CT

## 2023-09-30 NOTE — Consult Note (Signed)
 CODE SEPSIS - PHARMACY COMMUNICATION  **Broad Spectrum Antibiotics should be administered within 1 hour of Sepsis diagnosis**  Time Code Sepsis Called/Page Received: 0906  Antibiotics Ordered: Ceftriaxone   Time of 1st antibiotic administration: 2207  Additional action taken by pharmacy: messageD RN.       Cathaleen GORMAN Blanch ,PharmD Clinical Pharmacist  09/30/2023  10:15 PM

## 2023-09-30 NOTE — ED Notes (Signed)
 Pt taken to CT.

## 2023-09-30 NOTE — Progress Notes (Signed)
 Name:Sarah Shah MRN: [id number] DOB: [birth year]   CHIEF COMPLAINT:  EXCESSIVE DAYTIME SLEEPINESS   HISTORY OF PRESENT ILLNESS: Sarah Shah is a 23 y.o. w/ a h/o autism, OCD, PCOS, developmental delay and morbid obesity who present for c/o excessive daytime sleepiness which has been present for several years. Reports nocturnal awakenings due to nocturia and occasionally has difficulty falling back to sleep. Reports a 10 lb weight loss over the last few weeks. Admits to night sweats. Denies morning headaches, RLS symptoms, dream enactment, cataplexy, hypnagogic or hypnapompic hallucinations. Reports a family history of sleep apnea. Denies drowsy driving. Drinks coffee occasionally, denies alcohol, tobacco or denies illicit drug use.   Bedtime 11 pm Sleep onset 20 mins Rise time 8-9 am   EPWORTH SLEEP SCORE 3    09/30/2023    8:47 AM  Results of the Epworth flowsheet  Sitting and reading 0  Watching TV 0  Sitting, inactive in a public place (e.g. a theatre or a meeting) 0  As a passenger in a car for an hour without a break 1  Lying down to rest in the afternoon when circumstances permit 2  Sitting and talking to someone 0  Sitting quietly after a lunch without alcohol 0  In a car, while stopped for a few minutes in traffic 0  Total score 3    PAST MEDICAL HISTORY :   has a past medical history of ADHD (attention deficit hyperactivity disorder), Anemia, Anxiety, Autism spectrum, Constipated, Constipation, Depression, Development delay, GERD (gastroesophageal reflux disease), Headache, Insulin resistance, Iron deficiency, Learning disability, Obesity, ODD (oppositional defiant disorder), PCOS (polycystic ovarian syndrome), PMDD (premenstrual dysphoric disorder), Pneumonia, and Visual acuity reduced.  has a past surgical history that includes Tooth Extraction (N/A, 01/03/2018); Colonoscopy with propofol  (N/A, 02/17/2021); Esophagogastroduodenoscopy (egd) with propofol   (N/A, 02/17/2021); biopsy (02/17/2021); and Cholecystectomy (09/17/2021). Prior to Admission medications   Medication Sig Start Date End Date Taking? Authorizing Provider  acetaminophen  (TYLENOL ) 325 MG tablet Take 650 mg by mouth every 6 (six) hours as needed.   Yes [provider]  Amphetamine  Sulfate 10 MG TABS Take 1 tablet by mouth daily as needed. 01/29/23  Yes [provider]  amphetamine -dextroamphetamine  (ADDERALL  XR) 30 MG 24 hr capsule Take 1 capsule (30 mg total) by mouth in the morning. 06/21/23  Yes   amphetamine -dextroamphetamine  (ADDERALL  XR) 30 MG 24 hr capsule Take 1 capsule (30 mg total) by mouth in the morning. 07/21/23  Yes   busPIRone  (BUSPAR ) 30 MG tablet Take 30 mg by mouth 2 (two) times daily.   Yes [provider]  cloNIDine  (CATAPRES ) 0.2 MG tablet Take 0.2 mg by mouth 2 (two) times daily. 06/23/22  Yes [provider]  cromolyn  (OPTICROM ) 4 % ophthalmic solution SMARTSIG:1 Drop(s) In Eye(s) Twice Daily PRN 06/02/23  Yes [provider]  desogestrel -ethinyl estradiol  (VOLNEA ) 0.15-0.02/0.01 MG (21/5) tablet TAKE 1 TABLET BY MOUTH EVERY DAY. TAKE ACTIVE TABLETS ONLY, SKIP PLACEBO TABLETS. 01/17/23  Yes Dugal, Tabitha, FNP  docusate sodium  (COLACE) 100 MG capsule Take 100 mg by mouth at bedtime.   Yes [provider]  famotidine  (PEPCID ) 20 MG tablet Take 20 mg by mouth daily.   Yes [provider]  ferrous sulfate  325 (65 FE) MG tablet Take 325 mg by mouth at bedtime.   Yes [provider]  omeprazole  (PRILOSEC) 40 MG capsule Take 1 capsule (40 mg total) by mouth daily. 07/15/22  Yes Nandigam, [name] V, MD  RESTASIS  0.05 % ophthalmic emulsion 1 drop 2 (two) times daily. 06/02/23  Yes [provider]  tirzepatide  (ZEPBOUND ) 7.5 MG/0.5ML Pen Inject 7.5 mg into the skin once a week. 09/23/23  Yes Dugal, Tabitha, FNP  venlafaxine  XR (EFFEXOR -XR) 150 MG 24 hr capsule Take 150 mg by mouth daily. 07/15/22  Yes  [provider]  venlafaxine  XR (EFFEXOR -XR) 75 MG 24 hr capsule Take 75 mg by mouth daily. 09/11/22  Yes [provider]  vitamin C  (ASCORBIC ACID ) 500 MG tablet Take 500 mg by mouth at bedtime.   Yes [provider]   Allergies  Allergen Reactions   Wound Dressing Adhesive Itching    Itchy and irritated.   Semaglutide  Nausea And Vomiting    FAMILY HISTORY:  family history includes ADD / ADHD in her sister; Anxiety disorder in her father, mother, and sister; Atrial fibrillation in her maternal grandmother; Breast cancer in her maternal aunt; Colon polyps in her mother; Depression in her father; Diabetes in her father, maternal grandmother, and mother; Heart failure in her maternal grandmother; Hypertension in her maternal grandfather and mother; Iron deficiency in her maternal aunt; Irritable bowel syndrome in her mother; Obesity in her father and mother; Other in her mother and sister; Stroke in her mother. SOCIAL HISTORY:  reports that she has never smoked. She has been exposed to tobacco smoke. She has never used smokeless tobacco. She reports that she does not drink alcohol and does not use drugs.   Review of Systems:  Gen:  Denies  fever, sweats, chills weight loss  HEENT: Denies blurred vision, double vision, ear pain, eye pain, hearing loss, nose bleeds, sore throat Cardiac:  No dizziness, chest pain or heaviness, chest tightness,edema, No JVD Resp:   No cough, -sputum production, -shortness of breath,-wheezing, -hemoptysis,  Gi: Denies swallowing difficulty, stomach pain, nausea or vomiting, diarrhea, constipation, bowel incontinence Gu:  Denies bladder incontinence, burning urine Ext:   Denies Joint pain, stiffness or swelling Skin: Denies  skin rash, easy bruising or bleeding or hives Endoc:  Denies polyuria, polydipsia , polyphagia or weight change Psych:   Denies depression, insomnia or hallucinations  Other:  All other systems negative  VITAL  SIGNS: BP 110/72   Pulse (!) 110   Temp (!) 97.5 F (36.4 C)   Ht 5' 5 (1.651 m)   Wt (!) 316 lb 9.6 oz (143.6 kg)   SpO2 98%   BMI 52.68 kg/m    Physical Examination:   General Appearance: No distress  EYES PERRLA, EOM intact.   NECK Supple, No JVD Pulmonary: normal breath sounds, No wheezing.  CardiovascularNormal S1,S2.  No m/r/g.   Abdomen: Benign, Soft, non-tender. Skin:   warm, no rashes, no ecchymosis  Extremities: normal, no cyanosis, clubbing. Neuro:without focal findings,  speech normal  PSYCHIATRIC: Mood, affect within normal limits.   ASSESSMENT AND PLAN  OSA I suspect that OSA is likely present due to clinical presentation. Discussed the consequences of untreated sleep apnea. Advised not to drive drowsy for safety of patient and others. Will complete further evaluation with a home sleep study and follow up to review results.    Morbid obesity Counseled patient on diet and lifestyle modification.    MEDICATION ADJUSTMENTS/LABS AND TESTS ORDERED: Recommend Sleep Study   Patient  satisfied with Plan of action and management. All questions answered  Follow up to review HST results and treatment plan.   I spent a total of 45 minutes reviewing chart data, face-to-face evaluation with  the patient, counseling and coordination of care as detailed above.    Kyrel Leighton, M.D.  Sleep Medicine [facility]

## 2023-09-30 NOTE — Patient Instructions (Signed)
 Sarah Shah

## 2023-09-30 NOTE — ED Notes (Signed)
 Patients mom up to desk reporting patient is now complaining of chest pain. Patient brought to triage room and EKG completed

## 2023-09-30 NOTE — Sepsis Progress Note (Signed)
 Elink monitoring for the code sepsis protocol.

## 2023-09-30 NOTE — ED Notes (Signed)
 Attempted IV access without success. IV team consult put in.

## 2023-10-01 DIAGNOSIS — A419 Sepsis, unspecified organism: Secondary | ICD-10-CM | POA: Diagnosis not present

## 2023-10-01 LAB — COMPREHENSIVE METABOLIC PANEL WITH GFR
ALT: 100 U/L — ABNORMAL HIGH (ref 0–44)
AST: 155 U/L — ABNORMAL HIGH (ref 15–41)
Albumin: 3.2 g/dL — ABNORMAL LOW (ref 3.5–5.0)
Alkaline Phosphatase: 115 U/L (ref 38–126)
Anion gap: 12 (ref 5–15)
BUN: <5 mg/dL — ABNORMAL LOW (ref 6–20)
CO2: 23 mmol/L (ref 22–32)
Calcium: 8.6 mg/dL — ABNORMAL LOW (ref 8.9–10.3)
Chloride: 104 mmol/L (ref 98–111)
Creatinine, Ser: 0.68 mg/dL (ref 0.44–1.00)
GFR, Estimated: >60 mL/min (ref >60)
Glucose, Bld: 110 mg/dL — ABNORMAL HIGH (ref 70–99)
Potassium: 2.9 mmol/L — ABNORMAL LOW (ref 3.5–5.1)
Sodium: 139 mmol/L (ref 135–145)
Total Bilirubin: 0.9 mg/dL (ref 0.0–1.2)
Total Protein: 7 g/dL (ref 6.5–8.1)

## 2023-10-01 LAB — CBC
HCT: 38.4 % (ref 36.0–46.0)
Hemoglobin: 13.2 g/dL (ref 12.0–15.0)
MCH: 28 pg (ref 26.0–34.0)
MCHC: 34.4 g/dL (ref 30.0–36.0)
MCV: 81.5 fL (ref 80.0–100.0)
Platelets: 263 K/uL (ref 150–400)
RBC: 4.71 MIL/uL (ref 3.87–5.11)
RDW: 12.1 % (ref 11.5–15.5)
WBC: 15.7 K/uL — ABNORMAL HIGH (ref 4.0–10.5)
nRBC: 0 % (ref 0.0–0.2)

## 2023-10-01 LAB — CORTISOL-AM, BLOOD: Cortisol - AM: 19.1 ug/dL (ref 6.7–22.6)

## 2023-10-01 LAB — TROPONIN I (HIGH SENSITIVITY): Troponin I (High Sensitivity): <2 ng/L (ref <18)

## 2023-10-01 LAB — PROTIME-INR
INR: 1 (ref 0.8–1.2)
Prothrombin Time: 14.2 s (ref 11.4–15.2)

## 2023-10-01 LAB — HIV ANTIBODY (ROUTINE TESTING W REFLEX): HIV Screen 4th Generation wRfx: NONREACTIVE

## 2023-10-01 LAB — LACTIC ACID, PLASMA: Lactic Acid, Venous: 1.2 mmol/L (ref 0.5–1.9)

## 2023-10-01 MED ORDER — CLONIDINE HCL 0.1 MG PO TABS
0.1000 mg | ORAL_TABLET | Freq: Two times a day (BID) | ORAL | Status: DC
  Administered 2023-10-01 – 2023-10-02 (×3): 0.1 mg via ORAL
  Filled 2023-10-01 (×3): qty 1

## 2023-10-01 MED ORDER — TRAMADOL HCL 50 MG PO TABS
50.0000 mg | ORAL_TABLET | Freq: Four times a day (QID) | ORAL | Status: DC | PRN
  Administered 2023-10-01: 50 mg via ORAL
  Filled 2023-10-01: qty 1

## 2023-10-01 MED ORDER — SODIUM CHLORIDE 0.9 % IV BOLUS
250.0000 mL | Freq: Once | INTRAVENOUS | Status: AC
  Administered 2023-10-01: 250 mL via INTRAVENOUS

## 2023-10-01 MED ORDER — BOOST / RESOURCE BREEZE PO LIQD CUSTOM
1.0000 | Freq: Three times a day (TID) | ORAL | Status: DC
  Administered 2023-10-01 – 2023-10-02 (×2): 1 via ORAL

## 2023-10-01 MED ORDER — POTASSIUM CHLORIDE 2 MEQ/ML IV SOLN
INTRAVENOUS | Status: DC
  Filled 2023-10-01 (×4): qty 1000

## 2023-10-01 NOTE — Progress Notes (Signed)
 Patient have home med 9 tables of medicaton viorele , but is only taking 2 white one , sent it to pharmacy    [facility]

## 2023-10-01 NOTE — Progress Notes (Signed)
 PROGRESS NOTE    Sarah Shah  FMW:[id number] DOB: 2000/03/16 DOA: 09/30/2023 PCP: Corwin Antu, FNP    Brief Narrative:  23 y.o. female with medical history significant of ADHD, oppositional defiant disorder, depression, iron deficiency anemia PCOS GERD, disability, autism spectrum disorder who presented to the ER with abdominal pain nausea vomiting diarrhea.  Symptoms have been going on for couple of days.  Patient has had some diarrhea for a number of days until yesterday.  Patient was complaining of left lower quadrant pain and tenderness.  She also has some vomiting.  Patient was seen by her PCP yesterday where she had workup done including blood work and urinalysis which was all negative.  Patient meets sepsis criteria today with CT showing possibilities of acute enteritis and possible adenitis.  Suspected viral cause also.  Patient is being admitted sepsis of unknown cause.    Assessment & Plan:   Principal Problem:   Sepsis (HCC) Active Problems:   Attention deficit hyperactivity disorder (ADHD), combined type   Oppositional defiant disorder   Class 3 severe obesity with serious comorbidity and body mass index (BMI) of 50.0 to 59.9 in adult   Severe episode of recurrent major depressive disorder, without psychotic features (HCC)   GERD (gastroesophageal reflux disease)   Mixed hyperlipidemia   Hypokalemia   Leucocytosis   Hypomagnesemia  Abdominal pain Presumed gastroenteritis Sepsis secondary to above Patient reports worsening abdominal pain since Saturday approximately 5 days prior to presentation.  Suspect food related to gastroenteritis.  Patient is apparently a vegetarian and had intermittently consumed meatballs.   Plan: Continue Rocephin  2 g daily Flagyl  500 3 times daily IV fluids  Hypokalemia Hypomagnesemia Presumed secondary to GI losses Monitor and replace as necessary  Sinus tachycardia Presumed secondary to infection On intravenous fluids as  above  PCOS Resume home OCPs  Autism spectrum disorder Depression Generalized anxiety disorder ADHD No acute issues Resume home regimen  Hyperlipidemia Statin  Class III obesity BMI 53.38.  Complicates overall care and prognosis.    DVT prophylaxis: SQ Lovenox  Code Status: Full Family Communication: Mother at bedside 9/19 Disposition Plan: Status is: Inpatient Remains inpatient appropriate because: Sepsis secondary to gastroenteritis.  On IV antibiotics.  Clinically improving.  Anticipate discharge 9/20.   Level of care: Med-Surg  Consultants:  None  Procedures:  None  Antimicrobials: Ceftriaxone  Metronidazole    Subjective: Seen and examined.  Mother at bedside.  Patient reports clinical improvement since admission.  Reports some abdominal pain but improved prior.  Objective: Vitals:   10/01/23 0333 10/01/23 0612 10/01/23 0721 10/01/23 0853  BP: (!) 97/47 (!) 108/58 (!) 110/51 (!) 109/59  Pulse: (!) 119 (!) 126 (!) 125 (!) 117  Resp:   20 19  Temp:  99.8 F (37.7 C) 98.8 F (37.1 C) 99.4 F (37.4 C)  TempSrc:  Oral  Oral  SpO2: 95% 94% 93% 99%  Weight:      Height:        Intake/Output Summary (Last 24 hours) at 10/01/2023 1300 Last data filed at 10/01/2023 0900 Gross per 24 hour  Intake 3040.75 ml  Output --  Net 3040.75 ml   Filed Weights   09/30/23 1655 09/30/23 2330  Weight: (!) 143.3 kg (!) 145.5 kg    Examination:  General exam: Appears calm and comfortable  Respiratory system: Clear to auscultation. Respiratory effort normal. Cardiovascular system: S1-S2, tachycardic, regular rhythm, no murmurs Gastrointestinal system: Obese, soft, nondistended, mild TTP, normal bowel sounds Central nervous system: Alert and  oriented. No focal neurological deficits. Extremities: Symmetric 5 x 5 power. Skin: No rashes, lesions or ulcers Psychiatry: Judgement and insight appear normal. Mood & affect appropriate.     Data Reviewed: I have  personally reviewed following labs and imaging studies  CBC: Recent Labs  Lab 09/29/23 1226 09/30/23 1723 10/01/23 0115  WBC 6.3 18.3* 15.7*  NEUTROABS 3.6  --   --   HGB 14.1 15.1* 13.2  HCT 41.1 43.5 38.4  MCV 81.4 82.1 81.5  PLT 291.0 254 263   Basic Metabolic Panel: Recent Labs  Lab 09/29/23 1226 09/30/23 1723 10/01/23 0115  NA 139 135 139  K 3.3* 2.9* 2.9*  CL 106 101 104  CO2 21 20* 23  GLUCOSE 89 81 110*  BUN 6 6 <5*  CREATININE 0.52 0.48 0.68  CALCIUM 9.7 9.5 8.6*  MG  --  1.5*  --    GFR: Estimated Creatinine Clearance: 160.9 mL/min (by C-G formula based on SCr of 0.68 mg/dL). Liver Function Tests: Recent Labs  Lab 09/29/23 1226 09/30/23 1723 10/01/23 0115  AST 17 18 155*  ALT 26 25 100*  ALKPHOS 85 93 115  BILITOT 0.2 0.4 0.9  PROT 7.1 7.5 7.0  ALBUMIN 3.9 3.8 3.2*   Recent Labs  Lab 09/29/23 1226 09/30/23 1723  LIPASE 19.0 36   No results for input(s): AMMONIA in the last 168 hours. Coagulation Profile: Recent Labs  Lab 10/01/23 0115  INR 1.0   Cardiac Enzymes: No results for input(s): CKTOTAL, CKMB, CKMBINDEX, TROPONINI in the last 168 hours. BNP (last 3 results) No results for input(s): PROBNP in the last 8760 hours. HbA1C: No results for input(s): HGBA1C in the last 72 hours. CBG: No results for input(s): GLUCAP in the last 168 hours. Lipid Profile: No results for input(s): CHOL, HDL, LDLCALC, TRIG, CHOLHDL, LDLDIRECT in the last 72 hours. Thyroid  Function Tests: No results for input(s): TSH, T4TOTAL, FREET4, T3FREE, THYROIDAB in the last 72 hours. Anemia Panel: No results for input(s): VITAMINB12, FOLATE, FERRITIN, TIBC, IRON, RETICCTPCT in the last 72 hours. Sepsis Labs: Recent Labs  Lab 09/30/23 2213 10/01/23 0115  LATICACIDVEN 1.1 1.2    Recent Results (from the past 240 hours)  Culture, blood (routine x 2)     Status: None (Preliminary result)   Collection Time:  09/30/23 10:13 PM   Specimen: BLOOD  Result Value Ref Range Status   Specimen Description BLOOD RIGHT FOREARM  Final   Special Requests   Final    BOTTLES DRAWN AEROBIC AND ANAEROBIC Blood Culture results may not be optimal due to an inadequate volume of blood received in culture bottles   Culture   Final    NO GROWTH < 12 HOURS Performed at [facility], [address], [place], [place] [address]    Report Status PENDING  Incomplete  Resp panel by RT-PCR (RSV, Flu A&B, Covid) Anterior Nasal Swab     Status: None   Collection Time: 09/30/23 10:25 PM   Specimen: Anterior Nasal Swab  Result Value Ref Range Status   SARS Coronavirus 2 by RT PCR NEGATIVE NEGATIVE Final    Comment: (NOTE) SARS-CoV-2 target nucleic acids are NOT DETECTED.  The SARS-CoV-2 RNA is generally detectable in upper respiratory specimens during the acute phase of infection. The lowest concentration of SARS-CoV-2 viral copies this assay can detect is 138 copies/mL. A negative result does not preclude SARS-Cov-2 infection and should not be used as the sole basis for treatment or other patient management  decisions. A negative result may occur with  improper specimen collection/handling, submission of specimen other than nasopharyngeal swab, presence of viral mutation(s) within the areas targeted by this assay, and inadequate number of viral copies(<138 copies/mL). A negative result must be combined with clinical observations, patient history, and epidemiological information. The expected result is Negative.  Fact Sheet for Patients:  BloggerCourse.com  Fact Sheet for Healthcare Providers:  SeriousBroker.it  This test is no t yet approved or cleared by the [place]  FDA and  has been authorized for detection and/or diagnosis of SARS-CoV-2 by FDA under an Emergency Use Authorization (EUA). This EUA will remain  in effect (meaning this test can be  used) for the duration of the COVID-19 declaration under Section 564(b)(1) of the Act, 21 U.S.C.section 360bbb-3(b)(1), unless the authorization is terminated  or revoked sooner.       Influenza A by PCR NEGATIVE NEGATIVE Final   Influenza B by PCR NEGATIVE NEGATIVE Final    Comment: (NOTE) The Xpert Xpress SARS-CoV-2/FLU/RSV plus assay is intended as an aid in the diagnosis of influenza from Nasopharyngeal swab specimens and should not be used as a sole basis for treatment. Nasal washings and aspirates are unacceptable for Xpert Xpress SARS-CoV-2/FLU/RSV testing.  Fact Sheet for Patients: BloggerCourse.com  Fact Sheet for Healthcare Providers: SeriousBroker.it  This test is not yet approved or cleared by the [place]  FDA and has been authorized for detection and/or diagnosis of SARS-CoV-2 by FDA under an Emergency Use Authorization (EUA). This EUA will remain in effect (meaning this test can be used) for the duration of the COVID-19 declaration under Section 564(b)(1) of the Act, 21 U.S.C. section 360bbb-3(b)(1), unless the authorization is terminated or revoked.     Resp Syncytial Virus by PCR NEGATIVE NEGATIVE Final    Comment: (NOTE) Fact Sheet for Patients: BloggerCourse.com  Fact Sheet for Healthcare Providers: SeriousBroker.it  This test is not yet approved or cleared by the [place]  FDA and has been authorized for detection and/or diagnosis of SARS-CoV-2 by FDA under an Emergency Use Authorization (EUA). This EUA will remain in effect (meaning this test can be used) for the duration of the COVID-19 declaration under Section 564(b)(1) of the Act, 21 U.S.C. section 360bbb-3(b)(1), unless the authorization is terminated or revoked.  Performed at [facility], [address] [place], [place] [address]   Culture, blood (routine x 2)     Status:  None (Preliminary result)   Collection Time: 09/30/23 10:26 PM   Specimen: BLOOD  Result Value Ref Range Status   Specimen Description BLOOD RIGHT FOREARM  Final   Special Requests   Final    BOTTLES DRAWN AEROBIC AND ANAEROBIC Blood Culture results may not be optimal due to an inadequate volume of blood received in culture bottles   Culture   Final    NO GROWTH < 12 HOURS Performed at [facility], [address], [place], [place] [address]    Report Status PENDING  Incomplete         Radiology Studies: CT ABDOMEN PELVIS W CONTRAST Result Date: 09/30/2023 CLINICAL DATA:  Abdominal tenderness, right lower quadrant pain, nausea/vomiting/diarrhea for 5 days EXAM: CT ABDOMEN AND PELVIS WITH CONTRAST TECHNIQUE: Multidetector CT imaging of the abdomen and pelvis was performed using the standard protocol following bolus administration of intravenous contrast. RADIATION DOSE REDUCTION: This exam was performed according to the departmental dose-optimization program which includes automated exposure control, adjustment of the mA and/or kV according to patient size and/or  use of iterative reconstruction technique. CONTRAST:  OMNIPAQUE  IOHEXOL  350 MG/ML SOLN COMPARISON:  12/29/2020 FINDINGS: Lower chest: No acute pleural or parenchymal lung disease. Hepatobiliary: No focal liver abnormality is seen. Status post cholecystectomy. No biliary dilatation. Pancreas: Unremarkable. No pancreatic ductal dilatation or surrounding inflammatory changes. Spleen: Normal in size without focal abnormality. Adrenals/Urinary Tract: Adrenal glands are unremarkable. Kidneys are normal, without renal calculi, focal lesion, or hydronephrosis. Bladder is unremarkable. Stomach/Bowel: No bowel obstruction or ileus. Normal gas-filled appendix right lower quadrant. Radiodense material within the colonic lumen consistent with un digested pills. No bowel wall thickening or inflammatory change. Vascular/Lymphatic: No  significant vascular findings are present. No enlarged abdominal or pelvic lymph nodes. Subcentimeter right lower quadrant mesenteric lymph nodes are likely reactive. Reproductive: Uterus and bilateral adnexa are unremarkable. Other: No free fluid or free intraperitoneal gas. No abdominal wall hernia. Musculoskeletal: No acute or destructive bony abnormalities. Reconstructed images demonstrate no additional findings. IMPRESSION: 1. Subcentimeter right lower quadrant mesenteric lymph nodes, likely reactive. This could reflect sequela of mesenteric adenitis or gastroenteritis. 2. Normal appendix. Electronically Signed   By: Ozell Daring M.D.   On: 09/30/2023 20:11        Scheduled Meds:  amphetamine -dextroamphetamine   30 mg Oral Daily   ascorbic acid   500 mg Oral QHS   busPIRone   30 mg Oral BID   cloNIDine   0.1 mg Oral BID   cycloSPORINE   1 drop Both Eyes BID   docusate sodium   100 mg Oral QHS   enoxaparin  (LOVENOX ) injection  70 mg Subcutaneous Q24H   famotidine   20 mg Oral Daily   feeding supplement  1 Container Oral TID BM   ferrous sulfate   325 mg Oral QHS   pantoprazole   40 mg Oral Daily   potassium chloride   40 mEq Oral Once   venlafaxine  XR  225 mg Oral Daily   Continuous Infusions:  cefTRIAXone  (ROCEPHIN )  IV     lactated ringers  1,000 mL with potassium chloride  20 mEq/L  IV infusion     metronidazole  500 mg (10/01/23 1028)     LOS: 1 day     Calvin KATHEE Robson, MD [facility]   If 7PM-7AM, please contact night-coverage  10/01/2023, 1:00 PM

## 2023-10-01 NOTE — Progress Notes (Signed)
 VAT consulted to troubleshoot current PIV  PIV site functioning well with no signs of infiltration or phlebitis.Pulled back on catheter to confirm proper placement and patency; no resistance or issues noted. Dressing changed as per protocol, with clean technique and secured appropriately.  Consult complete

## 2023-10-01 NOTE — Plan of Care (Signed)

## 2023-10-02 DIAGNOSIS — A419 Sepsis, unspecified organism: Secondary | ICD-10-CM | POA: Diagnosis not present

## 2023-10-02 LAB — BASIC METABOLIC PANEL WITH GFR
Anion gap: 10 (ref 5–15)
BUN: 5 mg/dL — ABNORMAL LOW (ref 6–20)
CO2: 22 mmol/L (ref 22–32)
Calcium: 9.3 mg/dL (ref 8.9–10.3)
Chloride: 107 mmol/L (ref 98–111)
Creatinine, Ser: 0.63 mg/dL (ref 0.44–1.00)
GFR, Estimated: >60 mL/min (ref >60)
Glucose, Bld: 90 mg/dL (ref 70–99)
Potassium: 3.5 mmol/L (ref 3.5–5.1)
Sodium: 139 mmol/L (ref 135–145)

## 2023-10-02 LAB — CBC WITH DIFFERENTIAL/PLATELET
Abs Immature Granulocytes: 0.05 K/uL (ref 0.00–0.07)
Basophils Absolute: 0.1 K/uL (ref 0.0–0.1)
Basophils Relative: 1 %
Eosinophils Absolute: 0.1 K/uL (ref 0.0–0.5)
Eosinophils Relative: 1 %
HCT: 41 % (ref 36.0–46.0)
Hemoglobin: 13.9 g/dL (ref 12.0–15.0)
Immature Granulocytes: 1 %
Lymphocytes Relative: 26 %
Lymphs Abs: 2.2 K/uL (ref 0.7–4.0)
MCH: 28.7 pg (ref 26.0–34.0)
MCHC: 33.9 g/dL (ref 30.0–36.0)
MCV: 84.7 fL (ref 80.0–100.0)
Monocytes Absolute: 1 K/uL (ref 0.1–1.0)
Monocytes Relative: 12 %
Neutro Abs: 5 K/uL (ref 1.7–7.7)
Neutrophils Relative %: 59 %
Platelets: 247 K/uL (ref 150–400)
RBC: 4.84 MIL/uL (ref 3.87–5.11)
RDW: 12.4 % (ref 11.5–15.5)
WBC: 8.4 K/uL (ref 4.0–10.5)
nRBC: 0 % (ref 0.0–0.2)

## 2023-10-02 MED ORDER — DESOGESTREL-ETHINYL ESTRADIOL 0.15-0.02/0.01 MG (21/5) PO TABS: 1.0000 | ORAL_TABLET | Freq: Every day | ORAL | Status: DC

## 2023-10-02 MED ORDER — AMOXICILLIN-POT CLAVULANATE 875-125 MG PO TABS: 1.0000 | ORAL_TABLET | Freq: Two times a day (BID) | ORAL | 0 refills | Status: AC

## 2023-10-02 MED ORDER — FLUCONAZOLE 150 MG PO TABS: 150.0000 mg | ORAL_TABLET | Freq: Every day | ORAL | 0 refills | Status: AC

## 2023-10-02 NOTE — Discharge Summary (Signed)
 Physician Discharge Summary  Sarah Shah FMW:[id number] DOB: 2000/11/15 DOA: 09/30/2023  PCP: Corwin Antu, FNP  Admit date: 09/30/2023 Discharge date: 10/02/2023  Admitted From: Home Disposition:  Home  Recommendations for Outpatient Follow-up:  Follow up with PCP in 1-2 weeks   Home Health:No  Equipment/Devices:None   Discharge Condition:Stable  CODE STATUS:FULL  Diet recommendation: Reg  Brief/Interim Summary:  23 y.o. female with medical history significant of ADHD, oppositional defiant disorder, depression, iron deficiency anemia PCOS GERD, disability, autism spectrum disorder who presented to the ER with abdominal pain nausea vomiting diarrhea.  Symptoms have been going on for couple of days.  Patient has had some diarrhea for a number of days until yesterday.  Patient was complaining of left lower quadrant pain and tenderness.  She also has some vomiting.  Patient was seen by her PCP yesterday where she had workup done including blood work and urinalysis which was all negative.  Patient meets sepsis criteria today with CT showing possibilities of acute enteritis and possible adenitis.  Suspected viral cause also.  Patient is being admitted sepsis of unknown cause.     Discharge Diagnoses:  Principal Problem:   Sepsis (HCC) Active Problems:   Attention deficit hyperactivity disorder (ADHD), combined type   Oppositional defiant disorder   Class 3 severe obesity with serious comorbidity and body mass index (BMI) of 50.0 to 59.9 in adult   Severe episode of recurrent major depressive disorder, without psychotic features (HCC)   GERD (gastroesophageal reflux disease)   Mixed hyperlipidemia   Hypokalemia   Leucocytosis   Hypomagnesemia Abdominal pain Presumed gastroenteritis Sepsis secondary to above Patient reports worsening abdominal pain since Saturday approximately 5 days prior to presentation.  Suspect food related to gastroenteritis.  Patient is apparently a  vegetarian and had intermittently consumed meatballs.   Plan: White count cleared.  Laboratory data normalized.  Stable for discharge home.  Can discontinue Rocephin  and Flagyl .  Start Augmentin  875/125.  Complete additional 5 days for total 7-day antibiotic course.  Encourage fluid intake.  Hypokalemia Hypomagnesemia Presumed secondary to GI losses Recovered at time of discharge   Sinus tachycardia Presumed secondary to infection At time of discharge   PCOS Resume home OCPs   Autism spectrum disorder Depression Generalized anxiety disorder ADHD No acute issues Resume home regimen   Hyperlipidemia Statin   Class III obesity BMI 53.38.  Complicates overall care and prognosis.   Discharge Instructions  Discharge Instructions     Diet - low sodium heart healthy   Complete by: As directed    Increase activity slowly   Complete by: As directed       Allergies as of 10/02/2023       Reactions   Wound Dressing Adhesive Itching   Itchy and irritated.   Semaglutide  Nausea And Vomiting        Medication List     TAKE these medications    acetaminophen  325 MG tablet Commonly known as: TYLENOL  Take 650 mg by mouth every 6 (six) hours as needed.   amoxicillin -clavulanate 875-125 MG tablet Commonly known as: AUGMENTIN  Take 1 tablet by mouth 2 (two) times daily for 5 days.   Amphetamine  Sulfate 10 MG Tabs Take 1 tablet by mouth daily as needed.   amphetamine -dextroamphetamine  30 MG 24 hr capsule Commonly known as: ADDERALL  XR Take 1 capsule (30 mg total) by mouth in the morning.   amphetamine -dextroamphetamine  30 MG 24 hr capsule Commonly known as: ADDERALL  XR Take 1 capsule (30 mg total)  by mouth in the morning.   ascorbic acid  500 MG tablet Commonly known as: VITAMIN C  Take 500 mg by mouth at bedtime.   busPIRone  30 MG tablet Commonly known as: BUSPAR  Take 30 mg by mouth 2 (two) times daily.   cloNIDine  0.2 MG tablet Commonly known as:  CATAPRES  Take 0.2 mg by mouth 2 (two) times daily.   cromolyn  4 % ophthalmic solution Commonly known as: OPTICROM  SMARTSIG:1 Drop(s) In Eye(s) Twice Daily PRN   desogestrel -ethinyl estradiol  0.15-0.02/0.01 MG (21/5) tablet Commonly known as: Volnea  TAKE 1 TABLET BY MOUTH EVERY DAY. TAKE ACTIVE TABLETS ONLY, SKIP PLACEBO TABLETS.   docusate sodium  100 MG capsule Commonly known as: COLACE Take 100 mg by mouth at bedtime.   famotidine  20 MG tablet Commonly known as: PEPCID  Take 20 mg by mouth daily.   ferrous sulfate  325 (65 FE) MG tablet Take 325 mg by mouth at bedtime.   fluconazole  150 MG tablet Commonly known as: Diflucan  Take 1 tablet (150 mg total) by mouth daily at 12 noon for 2 days.   omeprazole  40 MG capsule Commonly known as: PRILOSEC Take 1 capsule (40 mg total) by mouth daily.   Restasis  0.05 % ophthalmic emulsion Generic drug: cycloSPORINE  1 drop 2 (two) times daily.   venlafaxine  XR 150 MG 24 hr capsule Commonly known as: EFFEXOR -XR Take 150 mg by mouth daily.   venlafaxine  XR 75 MG 24 hr capsule Commonly known as: EFFEXOR -XR Take 75 mg by mouth daily.   Zepbound  7.5 MG/0.5ML Pen Generic drug: tirzepatide  Inject 7.5 mg into the skin once a week.        Allergies  Allergen Reactions   Wound Dressing Adhesive Itching    Itchy and irritated.   Semaglutide  Nausea And Vomiting    Consultations: None   Procedures/Studies: CT ABDOMEN PELVIS W CONTRAST Result Date: 09/30/2023 CLINICAL DATA:  Abdominal tenderness, right lower quadrant pain, nausea/vomiting/diarrhea for 5 days EXAM: CT ABDOMEN AND PELVIS WITH CONTRAST TECHNIQUE: Multidetector CT imaging of the abdomen and pelvis was performed using the standard protocol following bolus administration of intravenous contrast. RADIATION DOSE REDUCTION: This exam was performed according to the departmental dose-optimization program which includes automated exposure control, adjustment of the mA and/or kV  according to patient size and/or use of iterative reconstruction technique. CONTRAST:  OMNIPAQUE  IOHEXOL  350 MG/ML SOLN COMPARISON:  12/29/2020 FINDINGS: Lower chest: No acute pleural or parenchymal lung disease. Hepatobiliary: No focal liver abnormality is seen. Status post cholecystectomy. No biliary dilatation. Pancreas: Unremarkable. No pancreatic ductal dilatation or surrounding inflammatory changes. Spleen: Normal in size without focal abnormality. Adrenals/Urinary Tract: Adrenal glands are unremarkable. Kidneys are normal, without renal calculi, focal lesion, or hydronephrosis. Bladder is unremarkable. Stomach/Bowel: No bowel obstruction or ileus. Normal gas-filled appendix right lower quadrant. Radiodense material within the colonic lumen consistent with un digested pills. No bowel wall thickening or inflammatory change. Vascular/Lymphatic: No significant vascular findings are present. No enlarged abdominal or pelvic lymph nodes. Subcentimeter right lower quadrant mesenteric lymph nodes are likely reactive. Reproductive: Uterus and bilateral adnexa are unremarkable. Other: No free fluid or free intraperitoneal gas. No abdominal wall hernia. Musculoskeletal: No acute or destructive bony abnormalities. Reconstructed images demonstrate no additional findings. IMPRESSION: 1. Subcentimeter right lower quadrant mesenteric lymph nodes, likely reactive. This could reflect sequela of mesenteric adenitis or gastroenteritis. 2. Normal appendix. Electronically Signed   By: Ozell Daring M.D.   On: 09/30/2023 20:11      Subjective: Seen and examined day of discharge.  Stable no distress.  Appropriate discharge home.  Discharge Exam: Vitals:   10/02/23 0513 10/02/23 0744  BP: 102/71 101/62  Pulse: (!) 105 (!) 101  Resp: 16 20  Temp: 98.1 F (36.7 C) 98.5 F (36.9 C)  SpO2: 97% 99%   Vitals:   10/01/23 1622 10/01/23 2027 10/02/23 0513 10/02/23 0744  BP: 128/73 103/65 102/71 101/62  Pulse: (!)  109 (!) 109 (!) 105 (!) 101  Resp: 18 16 16 20   Temp: 99.7 F (37.6 C) 98.2 F (36.8 C) 98.1 F (36.7 C) 98.5 F (36.9 C)  TempSrc: Oral   Oral  SpO2: 100% 98% 97% 99%  Weight:      Height:        General: Pt is alert, awake, not in acute distress Cardiovascular: RRR, S1/S2 +, no rubs, no gallops Respiratory: CTA bilaterally, no wheezing, no rhonchi Abdominal: Soft, NT, ND, bowel sounds + Extremities: no edema, no cyanosis    The results of significant diagnostics from this hospitalization (including imaging, microbiology, ancillary and laboratory) are listed below for reference.     Microbiology: Recent Results (from the past 240 hours)  Culture, blood (routine x 2)     Status: None (Preliminary result)   Collection Time: 09/30/23 10:13 PM   Specimen: BLOOD  Result Value Ref Range Status   Specimen Description BLOOD RIGHT FOREARM  Final   Special Requests   Final    BOTTLES DRAWN AEROBIC AND ANAEROBIC Blood Culture results may not be optimal due to an inadequate volume of blood received in culture bottles   Culture   Final    NO GROWTH 2 DAYS Performed at [facility], [address], [place], [place] [address]    Report Status PENDING  Incomplete  Resp panel by RT-PCR (RSV, Flu A&B, Covid) Anterior Nasal Swab     Status: None   Collection Time: 09/30/23 10:25 PM   Specimen: Anterior Nasal Swab  Result Value Ref Range Status   SARS Coronavirus 2 by RT PCR NEGATIVE NEGATIVE Final    Comment: (NOTE) SARS-CoV-2 target nucleic acids are NOT DETECTED.  The SARS-CoV-2 RNA is generally detectable in upper respiratory specimens during the acute phase of infection. The lowest concentration of SARS-CoV-2 viral copies this assay can detect is 138 copies/mL. A negative result does not preclude SARS-Cov-2 infection and should not be used as the sole basis for treatment or other patient management decisions. A negative result may occur with  improper specimen  collection/handling, submission of specimen other than nasopharyngeal swab, presence of viral mutation(s) within the areas targeted by this assay, and inadequate number of viral copies(<138 copies/mL). A negative result must be combined with clinical observations, patient history, and epidemiological information. The expected result is Negative.  Fact Sheet for Patients:  BloggerCourse.com  Fact Sheet for Healthcare Providers:  SeriousBroker.it  This test is no t yet approved or cleared by the [place]  FDA and  has been authorized for detection and/or diagnosis of SARS-CoV-2 by FDA under an Emergency Use Authorization (EUA). This EUA will remain  in effect (meaning this test can be used) for the duration of the COVID-19 declaration under Section 564(b)(1) of the Act, 21 U.S.C.section 360bbb-3(b)(1), unless the authorization is terminated  or revoked sooner.       Influenza A by PCR NEGATIVE NEGATIVE Final   Influenza B by PCR NEGATIVE NEGATIVE Final    Comment: (NOTE) The Xpert Xpress SARS-CoV-2/FLU/RSV plus assay is intended as an aid in the diagnosis  of influenza from Nasopharyngeal swab specimens and should not be used as a sole basis for treatment. Nasal washings and aspirates are unacceptable for Xpert Xpress SARS-CoV-2/FLU/RSV testing.  Fact Sheet for Patients: BloggerCourse.com  Fact Sheet for Healthcare Providers: SeriousBroker.it  This test is not yet approved or cleared by the [place]  FDA and has been authorized for detection and/or diagnosis of SARS-CoV-2 by FDA under an Emergency Use Authorization (EUA). This EUA will remain in effect (meaning this test can be used) for the duration of the COVID-19 declaration under Section 564(b)(1) of the Act, 21 U.S.C. section 360bbb-3(b)(1), unless the authorization is terminated or revoked.     Resp Syncytial  Virus by PCR NEGATIVE NEGATIVE Final    Comment: (NOTE) Fact Sheet for Patients: BloggerCourse.com  Fact Sheet for Healthcare Providers: SeriousBroker.it  This test is not yet approved or cleared by the [place]  FDA and has been authorized for detection and/or diagnosis of SARS-CoV-2 by FDA under an Emergency Use Authorization (EUA). This EUA will remain in effect (meaning this test can be used) for the duration of the COVID-19 declaration under Section 564(b)(1) of the Act, 21 U.S.C. section 360bbb-3(b)(1), unless the authorization is terminated or revoked.  Performed at [facility], [address] [place], [place] [address]   Culture, blood (routine x 2)     Status: None (Preliminary result)   Collection Time: 09/30/23 10:26 PM   Specimen: BLOOD  Result Value Ref Range Status   Specimen Description BLOOD RIGHT FOREARM  Final   Special Requests   Final    BOTTLES DRAWN AEROBIC AND ANAEROBIC Blood Culture results may not be optimal due to an inadequate volume of blood received in culture bottles   Culture   Final    NO GROWTH 2 DAYS Performed at [facility], [address], [place], [place] [address]    Report Status PENDING  Incomplete     Labs: BNP (last 3 results) No results for input(s): BNP in the last 8760 hours. Basic Metabolic Panel: Recent Labs  Lab 09/29/23 1226 09/30/23 1723 10/01/23 0115 10/02/23 1241  NA 139 135 139 139  K 3.3* 2.9* 2.9* 3.5  CL 106 101 104 107  CO2 21 20* 23 22  GLUCOSE 89 81 110* 90  BUN 6 6 <5* 5*  CREATININE 0.52 0.48 0.68 0.63  CALCIUM 9.7 9.5 8.6* 9.3  MG  --  1.5*  --   --    Liver Function Tests: Recent Labs  Lab 09/29/23 1226 09/30/23 1723 10/01/23 0115  AST 17 18 155*  ALT 26 25 100*  ALKPHOS 85 93 115  BILITOT 0.2 0.4 0.9  PROT 7.1 7.5 7.0  ALBUMIN 3.9 3.8 3.2*   Recent Labs  Lab 09/29/23 1226 09/30/23 1723  LIPASE 19.0 36   No  results for input(s): AMMONIA in the last 168 hours. CBC: Recent Labs  Lab 09/29/23 1226 09/30/23 1723 10/01/23 0115 10/02/23 1241  WBC 6.3 18.3* 15.7* 8.4  NEUTROABS 3.6  --   --  5.0  HGB 14.1 15.1* 13.2 13.9  HCT 41.1 43.5 38.4 41.0  MCV 81.4 82.1 81.5 84.7  PLT 291.0 254 263 247   Cardiac Enzymes: No results for input(s): CKTOTAL, CKMB, CKMBINDEX, TROPONINI in the last 168 hours. BNP: Invalid input(s): POCBNP CBG: No results for input(s): GLUCAP in the last 168 hours. D-Dimer No results for input(s): DDIMER in the last 72 hours. Hgb A1c No results for input(s): HGBA1C in the last 72  hours. Lipid Profile No results for input(s): CHOL, HDL, LDLCALC, TRIG, CHOLHDL, LDLDIRECT in the last 72 hours. Thyroid  function studies No results for input(s): TSH, T4TOTAL, T3FREE, THYROIDAB in the last 72 hours.  Invalid input(s): FREET3 Anemia work up No results for input(s): VITAMINB12, FOLATE, FERRITIN, TIBC, IRON, RETICCTPCT in the last 72 hours. Urinalysis    Component Value Date/Time   COLORURINE COLORLESS (A) 09/30/2023 1725   APPEARANCEUR CLEAR (A) 09/30/2023 1725   APPEARANCEUR Clear 12/17/2020 0940   LABSPEC 1.001 (L) 09/30/2023 1725   PHURINE 6.0 09/30/2023 1725   GLUCOSEU NEGATIVE 09/30/2023 1725   HGBUR LARGE (A) 09/30/2023 1725   BILIRUBINUR NEGATIVE 09/30/2023 1725   BILIRUBINUR negative 09/29/2023 1211   BILIRUBINUR Negative 12/17/2020 0940   KETONESUR NEGATIVE 09/30/2023 1725   PROTEINUR NEGATIVE 09/30/2023 1725   UROBILINOGEN negative (A) 09/29/2023 1211   UROBILINOGEN 0.2 09/11/2011 1847   NITRITE NEGATIVE 09/30/2023 1725   LEUKOCYTESUR TRACE (A) 09/30/2023 1725   Sepsis Labs Recent Labs  Lab 09/29/23 1226 09/30/23 1723 10/01/23 0115 10/02/23 1241  WBC 6.3 18.3* 15.7* 8.4   Microbiology Recent Results (from the past 240 hours)  Culture, blood (routine x 2)     Status: None (Preliminary result)    Collection Time: 09/30/23 10:13 PM   Specimen: BLOOD  Result Value Ref Range Status   Specimen Description BLOOD RIGHT FOREARM  Final   Special Requests   Final    BOTTLES DRAWN AEROBIC AND ANAEROBIC Blood Culture results may not be optimal due to an inadequate volume of blood received in culture bottles   Culture   Final    NO GROWTH 2 DAYS Performed at [facility], [address], [place], [place] [address]    Report Status PENDING  Incomplete  Resp panel by RT-PCR (RSV, Flu A&B, Covid) Anterior Nasal Swab     Status: None   Collection Time: 09/30/23 10:25 PM   Specimen: Anterior Nasal Swab  Result Value Ref Range Status   SARS Coronavirus 2 by RT PCR NEGATIVE NEGATIVE Final    Comment: (NOTE) SARS-CoV-2 target nucleic acids are NOT DETECTED.  The SARS-CoV-2 RNA is generally detectable in upper respiratory specimens during the acute phase of infection. The lowest concentration of SARS-CoV-2 viral copies this assay can detect is 138 copies/mL. A negative result does not preclude SARS-Cov-2 infection and should not be used as the sole basis for treatment or other patient management decisions. A negative result may occur with  improper specimen collection/handling, submission of specimen other than nasopharyngeal swab, presence of viral mutation(s) within the areas targeted by this assay, and inadequate number of viral copies(<138 copies/mL). A negative result must be combined with clinical observations, patient history, and epidemiological information. The expected result is Negative.  Fact Sheet for Patients:  BloggerCourse.com  Fact Sheet for Healthcare Providers:  SeriousBroker.it  This test is no t yet approved or cleared by the [place]  FDA and  has been authorized for detection and/or diagnosis of SARS-CoV-2 by FDA under an Emergency Use Authorization (EUA). This EUA will remain  in effect (meaning this  test can be used) for the duration of the COVID-19 declaration under Section 564(b)(1) of the Act, 21 U.S.C.section 360bbb-3(b)(1), unless the authorization is terminated  or revoked sooner.       Influenza A by PCR NEGATIVE NEGATIVE Final   Influenza B by PCR NEGATIVE NEGATIVE Final    Comment: (NOTE) The Xpert Xpress SARS-CoV-2/FLU/RSV plus assay is intended as an aid in  the diagnosis of influenza from Nasopharyngeal swab specimens and should not be used as a sole basis for treatment. Nasal washings and aspirates are unacceptable for Xpert Xpress SARS-CoV-2/FLU/RSV testing.  Fact Sheet for Patients: BloggerCourse.com  Fact Sheet for Healthcare Providers: SeriousBroker.it  This test is not yet approved or cleared by the [place]  FDA and has been authorized for detection and/or diagnosis of SARS-CoV-2 by FDA under an Emergency Use Authorization (EUA). This EUA will remain in effect (meaning this test can be used) for the duration of the COVID-19 declaration under Section 564(b)(1) of the Act, 21 U.S.C. section 360bbb-3(b)(1), unless the authorization is terminated or revoked.     Resp Syncytial Virus by PCR NEGATIVE NEGATIVE Final    Comment: (NOTE) Fact Sheet for Patients: BloggerCourse.com  Fact Sheet for Healthcare Providers: SeriousBroker.it  This test is not yet approved or cleared by the [place]  FDA and has been authorized for detection and/or diagnosis of SARS-CoV-2 by FDA under an Emergency Use Authorization (EUA). This EUA will remain in effect (meaning this test can be used) for the duration of the COVID-19 declaration under Section 564(b)(1) of the Act, 21 U.S.C. section 360bbb-3(b)(1), unless the authorization is terminated or revoked.  Performed at [facility], [address] [place], [place] [address]   Culture, blood (routine x 2)      Status: None (Preliminary result)   Collection Time: 09/30/23 10:26 PM   Specimen: BLOOD  Result Value Ref Range Status   Specimen Description BLOOD RIGHT FOREARM  Final   Special Requests   Final    BOTTLES DRAWN AEROBIC AND ANAEROBIC Blood Culture results may not be optimal due to an inadequate volume of blood received in culture bottles   Culture   Final    NO GROWTH 2 DAYS Performed at [facility], [address], [place], [place] [address]    Report Status PENDING  Incomplete     Time coordinating discharge: 40 minutes  SIGNED:   Calvin KATHEE Robson, MD  [facility] 10/02/2023, 2:05 PM Pager   If 7PM-7AM, please contact night-coverage

## 2023-10-02 NOTE — Plan of Care (Signed)

## 2023-10-04 ENCOUNTER — Institutional Professional Consult (permissible substitution): Admitting: Neurology

## 2023-10-04 ENCOUNTER — Ambulatory Visit: Payer: Self-pay | Admitting: Family

## 2023-10-04 ENCOUNTER — Telehealth: Payer: Self-pay

## 2023-10-04 NOTE — Progress Notes (Signed)
 noted

## 2023-10-04 NOTE — Transitions of Care (Post Inpatient/ED Visit) (Signed)
   10/04/2023  Name: Sarah Shah MRN: [id number] DOB: 11/17/2000  Today's TOC FU Call Status: Today's TOC FU Call Status:: Unsuccessful Call (1st Attempt) Unsuccessful Call (1st Attempt) Date: 10/04/23  Attempted to reach the patient regarding the most recent Inpatient/ED visit.  Follow Up Plan: Additional outreach attempts will be made to reach the patient to complete the Transitions of Care (Post Inpatient/ED visit) call.   Arvin Seip RN, BSN, CCM [employer], Population Health Case Manager Phone: [phone]

## 2023-10-05 ENCOUNTER — Telehealth: Payer: Self-pay | Admitting: *Deleted

## 2023-10-05 LAB — CULTURE, BLOOD (ROUTINE X 2)
Culture: NO GROWTH
Culture: NO GROWTH

## 2023-10-05 NOTE — Transitions of Care (Post Inpatient/ED Visit) (Signed)
   10/05/2023  Name: Sarah Shah MRN: [id number] DOB: 04-Mar-2000  Today's TOC FU Call Status: Today's TOC FU Call Status:: Unsuccessful Call (2nd Attempt) Unsuccessful Call (2nd Attempt) Date: 10/05/23  Attempted to reach the patient regarding the most recent Inpatient/ED visit.  Follow Up Plan: Additional outreach attempts will be made to reach the patient to complete the Transitions of Care (Post Inpatient/ED visit) call.   Andrea Dimes RN, BSN [place]  [facility] RN Care Manager [phone]

## 2023-10-06 ENCOUNTER — Telehealth: Payer: Self-pay | Admitting: *Deleted

## 2023-10-06 NOTE — Transitions of Care (Post Inpatient/ED Visit) (Signed)
   10/06/2023  Name: Sarah Shah MRN: [id number] DOB: [birth year]  Today's TOC FU Call Status: Today's TOC FU Call Status:: Unsuccessful Call (3rd Attempt) Unsuccessful Call (3rd Attempt) Date: 10/06/23  Attempted to reach the patient regarding the most recent Inpatient/ED visit.  Follow Up Plan: No further outreach attempts will be made at this time. We have been unable to contact the patient.  Cathlean Headland BSN RN [facility] Health Care Management Coordinator [email or web address] Direct Dial: [phone]  Fax: [phone] Website: [email or web address]

## 2023-10-11 ENCOUNTER — Telehealth: Admitting: Physician Assistant

## 2023-10-11 DIAGNOSIS — H9202 Otalgia, left ear: Secondary | ICD-10-CM | POA: Diagnosis not present

## 2023-10-12 MED ORDER — AMOXICILLIN 875 MG PO TABS: 875.0000 mg | ORAL_TABLET | Freq: Two times a day (BID) | ORAL | 0 refills | Status: AC

## 2023-10-12 MED ORDER — FLUCONAZOLE 150 MG PO TABS: ORAL_TABLET | ORAL | 0 refills | Status: DC

## 2023-10-12 NOTE — Progress Notes (Signed)
 I have spent 5 minutes in review of e-visit questionnaire, review and updating patient chart, medical decision making and response to patient.   Elsie Velma Lunger, PA-C

## 2023-10-12 NOTE — Progress Notes (Signed)
 E-Visit for Ear Pain - Acute Otitis Media   We are sorry that you are not feeling well. Here is how we plan to help!  Based on what you have shared with me it looks like you have Acute Otitis Media.  Acute Otitis Media is an infection of the middle or inner ear. This type of infection can cause redness, inflammation, and fluid buildup behind the tympanic membrane (ear drum).  The usual symptoms include: Earache/Pain Fever Upper respiratory symptoms Lack of energy/Fatigue/Malaise Slight hearing loss gradually worsening- if the inner ear fills with fluid What causes middle ear infections? Most middle ear infections occur when an infection such as a cold, leads to a build-up of mucus in the middle ear and causes the Eustachian tube (a thin tube that runs from the middle ear to the back of the nose) to become swollen or blocked.   This means mucus can't drain away properly, making it easier for an infection to spread into the middle ear.  How middle ear infections are treated: Most ear infections clear up within three to five days and don't need any specific treatment. If necessary, tylenol  or ibuprofen  should be used to relieve pain and a high temperature.  If you develop a fever higher than 102, or any significantly worsening symptoms, this could indicate a more serious infection moving to the middle/inner and needs face to face evaluation in an office by a provider.   Antibiotics aren't routinely used to treat middle ear infections, although they may occasionally be prescribed if symptoms persist or are particularly severe. Given your presentation,   I have prescribed Amoxicillin  875 mg one tablet twice daily for 10 days    Your symptoms should improve over the next 3 days and should resolve in about 7 days. Be sure to complete ALL of the prescription(s) given.  HOME CARE: Wash your hands frequently. If you are prescribed an ear drop, do not place the tip of the bottle on your ear or  touch it with your fingers. You can take Acetaminophen  650 mg every 4-6 hours as needed for pain.  If pain is severe or moderate, you can apply a heating pad (set on low) or hot water bottle (wrapped in a towel) to outer ear for 20 minutes.  This will also increase drainage.  GET HELP RIGHT AWAY IF: Fever is over 102.2 degrees. You develop progressive ear pain or hearing loss. Ear symptoms persist longer than 3 days after treatment.  MAKE SURE YOU: Understand these instructions. Will watch your condition. Will get help right away if you are not doing well or get worse.  Thank you for choosing an e-visit.  Your e-visit answers were reviewed by a board certified advanced clinical practitioner to complete your personal care plan. Depending upon the condition, your plan could have included both over the counter or prescription medications.  Please review your pharmacy choice. Make sure the pharmacy is open so you can pick up the prescription now. If there is a problem, you may contact your provider through [employer] and have the prescription routed to another pharmacy.  Your safety is important to us . If you have drug allergies check your prescription carefully.   For the next 24 hours you can use MyChart to ask questions about today's visit, request a non-urgent call back, or ask for a work or school excuse. You will get an email with a survey after your eVisit asking about your experience. We would appreciate your feedback. I  hope that your e-visit has been valuable and will aid in your recovery.  I have spent 5 minutes in review of e-visit questionnaire, review and updating patient chart, medical decision making and response to patient.   Elsie Velma Lunger, PA-C

## 2023-10-12 NOTE — Addendum Note (Signed)
 Addended by: GLADIS ELSIE BROCKS on: 10/12/2023 08:47 AM   Modules accepted: Orders

## 2023-10-17 ENCOUNTER — Encounter

## 2023-10-17 DIAGNOSIS — G4733 Obstructive sleep apnea (adult) (pediatric): Secondary | ICD-10-CM

## 2023-10-22 ENCOUNTER — Ambulatory Visit: Admitting: Sleep Medicine

## 2023-10-23 DIAGNOSIS — R1013 Epigastric pain: Secondary | ICD-10-CM | POA: Insufficient documentation

## 2023-10-23 DIAGNOSIS — R197 Diarrhea, unspecified: Secondary | ICD-10-CM | POA: Insufficient documentation

## 2023-10-25 ENCOUNTER — Telehealth: Payer: Self-pay | Admitting: Internal Medicine

## 2023-10-25 ENCOUNTER — Other Ambulatory Visit: Payer: Self-pay | Admitting: Family

## 2023-10-25 DIAGNOSIS — E282 Polycystic ovarian syndrome: Secondary | ICD-10-CM

## 2023-10-25 DIAGNOSIS — E782 Mixed hyperlipidemia: Secondary | ICD-10-CM

## 2023-10-25 DIAGNOSIS — Z6841 Body Mass Index (BMI) 40.0 and over, adult: Secondary | ICD-10-CM

## 2023-10-25 DIAGNOSIS — Z3041 Encounter for surveillance of contraceptive pills: Secondary | ICD-10-CM

## 2023-10-25 MED ORDER — OMEPRAZOLE 40 MG PO CPDR: 40.0000 mg | DELAYED_RELEASE_CAPSULE | Freq: Every day | ORAL | 1 refills | Status: DC

## 2023-10-25 NOTE — Telephone Encounter (Signed)
 Omeprazole refilled.

## 2023-10-25 NOTE — Telephone Encounter (Signed)
 Copied from CRM [id number]. Topic: Clinical - Medication Refill >> Oct 25, 2023  9:51 AM Aleatha C wrote: Medication: tirzepatide  (ZEPBOUND ) 7.5 MG/0.5ML Pen and desogestrel -ethinyl estradiol  (VOLNEA ) 0.15-0.02/0.01 MG (21/5) tablet  Has the patient contacted their pharmacy? Yes (Agent: If no, request that the patient contact the pharmacy for the refill. If patient does not wish to contact the pharmacy document the reason why and proceed with request.) (Agent: If yes, when and what did the pharmacy advise?)  This is the patient's preferred pharmacy:  [id number] - [place], [place] - [address] AT [facility] OF [address] & [address] [place] [address] Phone: [phone] Fax: [phone]  Is this the correct pharmacy for this prescription? Yes If no, delete pharmacy and type the correct one.   Has the prescription been filled recently? No  Is the patient out of the medication? Yes  Has the patient been seen for an appointment in the last year OR does the patient have an upcoming appointment? Yes  Can we respond through MyChart? No  Agent: Please be advised that Rx refills may take up to 3 business days. We ask that you follow-up with your pharmacy.

## 2023-10-25 NOTE — Telephone Encounter (Signed)
 Spoke with patient's mother and she needs to have a refill sent for pantoprazole  sent to [facility] on [address]. She has scheduled and appointment for future refills but she is completely out of the medication.

## 2023-10-26 ENCOUNTER — Other Ambulatory Visit: Payer: Self-pay | Admitting: Medical Genetics

## 2023-10-26 ENCOUNTER — Encounter: Payer: Self-pay | Admitting: Family

## 2023-10-26 DIAGNOSIS — Z006 Encounter for examination for normal comparison and control in clinical research program: Secondary | ICD-10-CM

## 2023-10-26 MED ORDER — DESOGESTREL-ETHINYL ESTRADIOL 0.15-0.02/0.01 MG (21/5) PO TABS: ORAL_TABLET | ORAL | 0 refills | Status: DC

## 2023-10-26 MED ORDER — ZEPBOUND 7.5 MG/0.5ML ~~LOC~~ SOAJ: 7.5000 mg | SUBCUTANEOUS | 0 refills | Status: DC

## 2023-10-27 ENCOUNTER — Ambulatory Visit (INDEPENDENT_AMBULATORY_CARE_PROVIDER_SITE_OTHER): Admitting: Clinical

## 2023-10-27 DIAGNOSIS — F902 Attention-deficit hyperactivity disorder, combined type: Secondary | ICD-10-CM

## 2023-10-27 DIAGNOSIS — F419 Anxiety disorder, unspecified: Secondary | ICD-10-CM

## 2023-10-27 DIAGNOSIS — F840 Autistic disorder: Secondary | ICD-10-CM

## 2023-10-27 DIAGNOSIS — F32A Depression, unspecified: Secondary | ICD-10-CM | POA: Diagnosis not present

## 2023-10-27 DIAGNOSIS — R0683 Snoring: Secondary | ICD-10-CM | POA: Diagnosis not present

## 2023-10-27 NOTE — Progress Notes (Signed)
 [facility] Counselor/Therapist Progress Note  Patient ID: Sarah Shah, MRN: [id number]    Date: 10/27/23  Time Spent: 3:08 pm - 3:55 pm: 47 Minutes  Type of Service Provided Individual Therapy  Type of Contact in-person Location: office  Mental Status Exam: Appearance:  A bit disheveled     Behavior: Appropriate and Sharing  Motor: Normal  Speech/Language:  Clear and Coherent and Normal Rate  Affect: Appropriate  Mood: normal  Thought process: normal  Thought content:   WNL  Sensory/Perceptual disturbances:   WNL  Orientation: oriented to person, place, time/date, and situation  Attention: Good  Concentration: Good  Memory: WNL  Fund of knowledge:  Fair  Insight:   Fair  Judgment:  Fair  Impulse Control: Fair to good   Risk Assessment:  Danger to Self:  No - denied SI between visits Self-injurious Behavior: No - denied SIB between visits Danger to Others: No Duty to Warn:no  Presenting Problems, Reported Symptoms, and /or Interim History: Sarah Shah presented for a session to address anxiety, stress, and mood concerns.   Subjective: Sarah Shah presented for an individual outpatient therapy session. The following was addressed during sessions.   Between visits Sarah Shah went on two trips and then was hospitalized for an illness. Sarah Shah rated her mood as a 7 out of 10 (with 1 being sad, 5 being neutral, and 10 being happy).  [name] rated her anxiety as a 8 out of 10 (with 10 being high) and irritation/frustration as medium but a bit better than it had been before. Her feelings about her recent hospitalization for illness were processed. Sarah Shah discussed some struggles at home and how to manage these were discussed. Sarah Shah is currently in one class (she had to stop taking the other class) and has been doing okay but may have had difficulty with the most recent exam. A path forward and how to approach the rest of the semester was discussed. Her plans for next semester were also  discussed. Sarah Shah has reconnected with a prior friend, but given their challenging history Sarah Shah was encouraged to take things slow and and maintain her boundaries. She reported making some positive strides in her eating and exercise since being released from the hospital, and this was related to her mental health coping strategies. Issues of identity were discussed.    Interventions/Psychotherapy Techniques Used During Session: Cognitive Behavioral Therapy  Diagnosis: Depression, unspecified depression type  Autism spectrum disorder  Anxiety  Attention deficit hyperactivity disorder (ADHD), combined type  MENTAL HEALTH INTERVENTIONS USED DURING TREATMENT & PATIENT'S RESPONSE TO INTERVENTIONS:  Short-term Objective addressed today:Sarah Shah will engage in behavioral activation strategies to decrease symptoms of depression AND Sarah Shah will engage in regular appropriate relaxation and coping activities to try to enhance her overall coping resources Mental health techniques used: Objective was addressed in session through the use of Cognitive Behavioral Therapy and discussion, demonstrations. [name]'s response was positive. Progress Toward Goal: progressing    PLAN  1. Sarah Shah will return for a therapy session.   2. Homework Given:  continue to engage in regular walking and heathy eating, maintain boundaries with friend, focus on studying and achieving an adequate grade this semester. This homework will be reviewed with Sarah Shah at the next visit.  3. During the next session check in on mood, anxiety, school, relationships with parent, and stressors.     Sarah Dumas, [name]   Individual Treatment Plan  - please see the notes from 04/17/2021 & 05/01/2021 for the initial treatment plan, the  updates in Winter 2024, and the goal review from 06/02/2022, and another goal review from 05/18/2023 for complete treatment plan information.    Updated goals as of May 2025 Problem/Need: [name] has a history of anxiety and  mood concerns - with some ongoing challenges in these areas  Long-Term Goal #1: Sarah Shah will be able to identify and address any mood or anxiety challenges and will maintain a low level of anxiety and depression symptoms as evidenced by self-report.  Updated Short-Term Objectives as of 2025: Objective 1A: Sarah Shah will be able to identify any negative thinking patterns that are perpetuating anxiety or depressive symptoms and replace these with more adaptive thoughts. - GOAL Partially met  Objective 1B: Sarah Shah will engage in behavioral activation strategies to decrease symptoms of depression Objective 1C: Sarah Shah will engage in regular appropriate relaxation and coping activities to try to enhance her overall coping resources Objective 1D: Sarah Shah will be able to appropriately manage periods of depressive symptoms and anxiety without using negative coping strategies such as self-injurious behaviors Objective 1E: Sarah Shah will be able to use appropriate coping strategies including helpful thoughts to maintain a low level of anxiety and depressive symptoms. Interventions: Cognitive Behavioral Therapy, Psychologist, occupational, Motivational Interviewing, and Psycho-education/Bibliotherapy  coping skills and other evidenced-based practices will be used to promote progress towards healthy functioning and to help manage decrease symptoms associated with their diagnosis.  Treatment Regimen: Individual skill building sessions every 2-4 weeks to address treatment goal/objective Target Date: 05/2024 Responsible Party: therapist and patient Person delivering treatment: Licensed Psychologist Sarah Dumas, [name] will support the patient's ability to achieve the goals identified. Resolved:  No -as of May 2025, Sarah Shah has continued to make progress with managing her anxiety and depression, but has periodically experienced increases in her symptoms of depression as well as anxiety and inconsistently uses her coping resources when  experiencing these increases -therefore, this goal is continued   Problem/Need: Life transition - Sarah Shah is in the process of completing a major life transition including graduating from high school, starting college, and potentially working. - As of 06/02/2022 Sarah Shah has graduated from high school and successfully completed 2 semesters of college. However she continues to have life transitions, so the problem/need section has been updated - As of 05/18/2023 Sarah Shah has continued to work towards completing her life transitions  Updated Problem/Need: Life transition - Sarah Shah has continuing life transitions including working towards completing her college program and getting a job.  Long-Term Goal #2: Tarissa will successfully navigate these life transitions including being able to manage her stress level.  Updated Short-Term Objectives as of 2025: Current stressors associated with life transitions that Quaneisha continues to struggle with include time management and expanding her social circle Objective 2A: Deryn will be able to develop a flexible schedule that includes completing her school work and home tasks Objective 2B: Yulissa will identify people or settings where there is the possibility of developing a friendship  Objective 2C: Martha will begin to consider employment opportunities.  Objective 2D: Naijah will complete necessary coursework Interventions: Assertiveness/Communication, Psychologist, occupational, and Psycho-education/Bibliotherapy , executive functioning skills strategies and other evidenced-based practices will be used to promote progress towards healthy functioning and to help manage decrease symptoms associated with their diagnosis.  Treatment Regimen: Individual skill building sessions every 2-4 weeks to address treatment goal/objective Target Date: 05/2024 Responsible Party: therapist and patient Person delivering treatment: Licensed Psychologist Sarah Dumas, [name] will support the patient's  ability to achieve the goals identified. Resolved:  No  Problem/Need: Life skills - Talyssa needs to develop some additional skills to facilitate independent living. Long-Term Goal #3: Maziah will begin to develop additional life skills necessary to facilitate independent living.  Updated Short-Term Objectives as of 2025: areas identified include money management, cooking, and transportation Objective 3A: Angelia will begin to study for her drivers test Objective 3B: Deshauna will understand her money spending and saving priorities  Objective 3C: Nur will develop and stick to a budget  Interventions: Motivational Interviewing, Solution-Oriented/Positive Psychology, and Psycho-education/Bibliotherapy  daily life skills, and other evidenced-based practices will be used to promote progress towards healthy functioning and to help manage decrease symptoms associated with their diagnosis.  Treatment Regimen: Individual skill building sessions every 2-4 weeks to address treatment goal/objective Target Date: 05/2024 Responsible Party: therapist and patient Person delivering treatment: Licensed Psychologist Sarah Dumas, [name] will support the patient's ability to achieve the goals identified. Resolved:  No - Cambrey has made some strides towards identifying areas that need to be addressed to facilitate independent living and made some progress on developing those skills, but process continues    Sarah Dumas, [name]

## 2023-10-28 ENCOUNTER — Ambulatory Visit: Payer: Self-pay

## 2023-10-28 DIAGNOSIS — G4733 Obstructive sleep apnea (adult) (pediatric): Secondary | ICD-10-CM

## 2023-11-03 NOTE — Telephone Encounter (Signed)
 Copied from CRM #[id number]. Topic: Clinical - Lab/Test Results >> Nov 01, 2023  4:12 PM Sarah Shah wrote: Reason for CRM: Provided pt with sleep study results stated she wants to proceed with the CPAP therapy and I did schedule her the follow up appt.

## 2023-11-05 ENCOUNTER — Encounter: Payer: Self-pay | Admitting: Family

## 2023-11-05 ENCOUNTER — Telehealth: Payer: Self-pay | Admitting: Sleep Medicine

## 2023-11-05 ENCOUNTER — Other Ambulatory Visit: Payer: Self-pay | Admitting: *Deleted

## 2023-11-05 ENCOUNTER — Ambulatory Visit (INDEPENDENT_AMBULATORY_CARE_PROVIDER_SITE_OTHER): Admitting: Family

## 2023-11-05 DIAGNOSIS — E282 Polycystic ovarian syndrome: Secondary | ICD-10-CM | POA: Diagnosis not present

## 2023-11-05 DIAGNOSIS — G4733 Obstructive sleep apnea (adult) (pediatric): Secondary | ICD-10-CM

## 2023-11-05 MED ORDER — METFORMIN HCL ER 500 MG PO TB24: 500.0000 mg | ORAL_TABLET | Freq: Every day | ORAL | 1 refills | Status: DC

## 2023-11-05 MED ORDER — METFORMIN HCL ER 500 MG PO TB24: 500.0000 mg | ORAL_TABLET | Freq: Every day | ORAL | 0 refills | Status: AC

## 2023-11-05 NOTE — Progress Notes (Signed)
 Established Patient Office Visit  Subjective:      CC:  Chief Complaint  Patient presents with   Medical Management of Chronic Issues    HPI: Sarah Shah is a 23 y.o. female presenting on 11/05/2023 for Medical Management of Chronic Issues .  Discussed the use of AI scribe software for clinical note transcription with the patient, who gave verbal consent to proceed.  History of Present Illness Sarah Shah is a 23 year old female with obesity and mild sleep apnea who presents for weight management consultation.  She is experiencing significant challenges with weight loss despite engaging in daily physical activity, including walking for thirty minutes each day. There is a persistent feeling of hunger, and she has been unable to lose weight effectively. She has previously tried phentermine, which caused agitation, and Topamax  for migraines, which interfered with her other medications. She has also been to [facility] but faced issues with the availability of the doctor.  She is currently on Adderall  and venlafaxine , which limits the use of certain weight management medications. She has an upcoming appointment with an endocrinologist in January. She has mild sleep apnea.  She has a history of non-alcoholic fatty liver disease. She does not have diabetes or cardiovascular disease, and her A1c levels have been normal. She is lactose intolerant and uses oat milk as a substitute. She has no gallbladder, which may affect her digestion and medication tolerance.  In terms of diet, she is mindful of her intake and avoids unhealthy foods, although she occasionally indulges in treats like [employer] pumpkin spice drinks. She has not had any Halloween candy and is cautious about her food choices during the holiday season.  Her mother has suggested a keto diet, but she finds it unrealistic to maintain long-term.   She reports waking up in the middle of the night and  not getting a full night's rest, which is consistent with her mild sleep apnea diagnosis.         Social history:  Relevant past medical, surgical, family and social history reviewed and updated as indicated. Interim medical history since our last visit reviewed.  Allergies and medications reviewed and updated.  DATA REVIEWED: CHART IN EPIC     ROS: Negative unless specifically indicated above in HPI.    Current Outpatient Medications:    acetaminophen  (TYLENOL ) 325 MG tablet, Take 650 mg by mouth every 6 (six) hours as needed., Disp: , Rfl:    Amphetamine  Sulfate 10 MG TABS, Take 1 tablet by mouth daily as needed., Disp: , Rfl:    amphetamine -dextroamphetamine  (ADDERALL  XR) 30 MG 24 hr capsule, Take 1 capsule (30 mg total) by mouth in the morning., Disp: 30 capsule, Rfl: 0   amphetamine -dextroamphetamine  (ADDERALL  XR) 30 MG 24 hr capsule, Take 1 capsule (30 mg total) by mouth in the morning., Disp: 30 capsule, Rfl: 0   busPIRone  (BUSPAR ) 30 MG tablet, Take 30 mg by mouth 2 (two) times daily., Disp: , Rfl:    cloNIDine  (CATAPRES ) 0.2 MG tablet, Take 0.2 mg by mouth 2 (two) times daily., Disp: , Rfl:    cromolyn  (OPTICROM ) 4 % ophthalmic solution, SMARTSIG:1 Drop(s) In Eye(s) Twice Daily PRN, Disp: , Rfl:    desogestrel -ethinyl estradiol  (VOLNEA ) 0.15-0.02/0.01 MG (21/5) tablet, TAKE 1 TABLET BY MOUTH EVERY DAY. TAKE ACTIVE TABLETS ONLY, SKIP PLACEBO TABLETS., Disp: 90 tablet, Rfl: 0   docusate sodium  (COLACE) 100 MG capsule, Take 100 mg by mouth at bedtime., Disp: ,  Rfl:    famotidine  (PEPCID ) 20 MG tablet, Take 20 mg by mouth daily., Disp: , Rfl:    ferrous sulfate  325 (65 FE) MG tablet, Take 325 mg by mouth at bedtime., Disp: , Rfl:    omeprazole  (PRILOSEC) 40 MG capsule, Take 1 capsule (40 mg total) by mouth daily., Disp: 90 capsule, Rfl: 1   RESTASIS  0.05 % ophthalmic emulsion, 1 drop 2 (two) times daily., Disp: , Rfl:    venlafaxine  XR (EFFEXOR -XR) 150 MG 24 hr capsule, Take  150 mg by mouth daily., Disp: , Rfl:    venlafaxine  XR (EFFEXOR -XR) 75 MG 24 hr capsule, Take 75 mg by mouth daily., Disp: , Rfl:    vitamin C  (ASCORBIC ACID ) 500 MG tablet, Take 500 mg by mouth at bedtime., Disp: , Rfl:         Objective:        BP 122/80 (BP Location: Left Arm, Patient Position: Sitting, Cuff Size: Large)   Pulse 100   Temp 98 F (36.7 C) (Temporal)   Ht 5' 5 (1.651 m)   Wt (!) 320 lb 6.4 oz (145.3 kg)   SpO2 98%   BMI 53.32 kg/m   Physical Exam   Wt Readings from Last 3 Encounters:  11/05/23 (!) 320 lb 6.4 oz (145.3 kg)  09/30/23 (!) 320 lb 12.3 oz (145.5 kg)  09/30/23 (!) 316 lb 9.6 oz (143.6 kg)    Physical Exam Vitals reviewed.  Constitutional:      General: She is not in acute distress.    Appearance: Normal appearance. She is normal weight. She is not ill-appearing, toxic-appearing or diaphoretic.  HENT:     Head: Normocephalic.  Cardiovascular:     Rate and Rhythm: Normal rate.  Pulmonary:     Effort: Pulmonary effort is normal.  Musculoskeletal:        General: Normal range of motion.  Neurological:     General: No focal deficit present.     Mental Status: She is alert and oriented to person, place, and time. Mental status is at baseline.  Psychiatric:        Mood and Affect: Mood normal.        Behavior: Behavior normal.        Thought Content: Thought content normal.        Judgment: Judgment normal.          Results LABS A1c: within normal range  DIAGNOSTIC Sleep study: mild sleep apnea  Assessment & Plan:   Assessment and Plan Assessment & Plan Obesity with insulin resistance Ongoing issue with difficulty losing weight despite efforts. Insurance does not cover GLP-1 medications due to lack of qualifying conditions such as moderate to severe sleep apnea or diabetes. Previous trials of phentermine and topiramate  were unsuccessful due to side effects. Current medications include Adderall  and venlafaxine , limiting  other pharmacological options. Referral to endocrinology pending for further evaluation and management. Discussed lifestyle modifications including diet and exercise. Encouraged to engage in strength training and maintain a calorie deficit. Discussed potential use of berberine and reintroduction of metformin  extended release, monitoring for side effects such as lactic acidosis. Discussed the potential impact of mental health on weight management and the importance of therapy. - Refer to endocrinology for further evaluation and management of insulin resistance. - Encourage strength training and maintaining a calorie deficit. - Discuss potential use of berberine for appetite control. - Reintroduce metformin  extended release, monitor for side effects. - Continue therapy for mental health support.  Nonalcoholic fatty liver disease  Sleep apnea, mild Mild sleep apnea diagnosed. Current CPAP setup is being updated. Discussed potential impact of sleep apnea on weight management and energy levels. Insurance coverage for GLP-1 medications requires moderate to severe sleep apnea, which is not met. - Update CPAP setup.        Return in about 6 months (around 05/05/2024) for f/u CPE.     Ginger Patrick, MSN, APRN, FNP-C [facility]

## 2023-11-05 NOTE — Telephone Encounter (Signed)
 Dr. Jess placed an order for new cpap setup. The order was sent to [facility]. We received a response from Stacy with [facility] RE: New Cpap Setup patient has intellectual disability and Mother handles things Received: Yesterday Sarah Shah, Sarah Shah   She has [employer] which follows Medicare guidelines they use the 4% desat scoring Criteria, Her AHI at 4% is only 1.4 this does not qualify her.  Please advise Thank you

## 2023-11-09 ENCOUNTER — Encounter: Payer: Self-pay | Admitting: Family

## 2023-11-09 DIAGNOSIS — Z6841 Body Mass Index (BMI) 40.0 and over, adult: Secondary | ICD-10-CM

## 2023-11-09 DIAGNOSIS — E782 Mixed hyperlipidemia: Secondary | ICD-10-CM

## 2023-11-10 NOTE — Telephone Encounter (Signed)
 Form and documents have been sent to [facility] and they will contact the patient to schedule

## 2023-11-16 ENCOUNTER — Ambulatory Visit (INDEPENDENT_AMBULATORY_CARE_PROVIDER_SITE_OTHER): Admitting: Clinical

## 2023-11-16 DIAGNOSIS — F419 Anxiety disorder, unspecified: Secondary | ICD-10-CM | POA: Diagnosis not present

## 2023-11-16 DIAGNOSIS — F840 Autistic disorder: Secondary | ICD-10-CM | POA: Diagnosis not present

## 2023-11-16 DIAGNOSIS — F32A Depression, unspecified: Secondary | ICD-10-CM

## 2023-11-16 DIAGNOSIS — F902 Attention-deficit hyperactivity disorder, combined type: Secondary | ICD-10-CM

## 2023-11-16 NOTE — Progress Notes (Signed)
 [facility] Counselor/Therapist Progress Note  Patient ID: Sarah Shah, MRN: [id number]    Date: 11/16/23  Time Spent: 2:30 pm - 3:20 pm: 50 Minutes  Type of Service Provided Individual Therapy  Type of Contact in-person Location: office   Mental Status Exam: Appearance:  Casual     Behavior: Appropriate  Motor: Normal  Speech/Language:  Clear and Coherent and Normal Rate  Affect: Appropriate  Mood: normal, stressed  Thought process: normal  Thought content:   WNL  Sensory/Perceptual disturbances:   WNL  Orientation: oriented to person, place, time/date, and situation  Attention: Good  Concentration: Good  Memory: WNL  Fund of knowledge:  Fair  Insight:   Fair  Judgment:  Fair  Impulse Control: Fair   Risk Assessment:  Danger to Self:  No - denied SI Self-injurious Behavior: denied SIB- Sarah Shah reported that she does occasionally have thoughts about engaging in self-injurious behaviors, but is currently able to talk herself out of them and has not acted on them  Danger to Others: No Duty to Warn:no  Presenting Problems, Reported Symptoms, and /or Interim History: Sarah Shah presented for a session to address mood and anxiety challenges as well as life stress.   Subjective: Sarah Shah presented for an individual outpatient therapy session. The following was addressed during sessions.   Sarah Shah reported that she has been feeling a bit depressed lately. Her insurance will no longer cover her weight loss medication, which has resulted in her feeling badly about herself and eating less healthily than she had been. She has been experiencing a medium amount of anxiety and some frustration. School is going okay, but she struggles with tests. She has been avoiding signing up for classes next semester due to anxiety about her mother telling her that she had to take two classes that she has challenges with. Alternatives to this were discussed. Strategies she could use to help  combat depression were discussed. Several positive things that have happened recently were also discussed. Strategies to try to get through the semester were discussed. Sarah Shah's struggles with desiring independence but not feeling like this is accessible to her currently were discussed as well. However, she has been approved for work with [employer] and will begin working on considering a job.   Interventions/Psychotherapy Techniques Used During Session: Cognitive Behavioral Therapy, solution focused, and executive functioning skills   Diagnosis: Depression, unspecified depression type  Autism spectrum disorder  Anxiety  Attention deficit hyperactivity disorder (ADHD), combined type  MENTAL HEALTH INTERVENTIONS USED DURING TREATMENT & PATIENT'S RESPONSE TO INTERVENTIONS:  Short-term Objective addressed today:Sarah Shah will engage in behavioral activation strategies to decrease symptoms of depression AND Sarah Shah will be able to appropriately manage periods of depressive symptoms and anxiety without using negative coping strategies such as self-injurious behaviors Mental health techniques used: Objective was addressed in session through the use of  Cognitive Behavioral Therapy and solution focused strategies and discussion. Sarah Shah's response was positive. Progress Toward Goal: some progress  Short-term Objective addressed today:Sarah Shah will begin to consider employment opportunities. Mental health techniques used: Objective was addressed in session through the use of solution focused and executive functioning skills and discussion. Sarah Shah's response was generally positive. Progress Toward Goal: progressing    PLAN  1. Sarah Shah will return for a therapy session.   2. Homework Given:  use strategies to try to manage symptoms of depression, consider alternatives for signing up for two classes that cause anxiety at the same time. This homework will be reviewed with Sarah Shah at  the next visit.  3. During the next session  check in on mood, anxiety, and school.     Sarah Dumas, [name]   Individual Treatment Plan  - please see the notes from 04/17/2021 & 05/01/2021 for the initial treatment plan, the updates in Winter 2024, and the goal review from 06/02/2022, and another goal review from 05/18/2023 for complete treatment plan information.    Updated goals as of May 2025 Problem/Need: Sarah Shah has a history of anxiety and mood concerns - with some ongoing challenges in these areas  Long-Term Goal #1: Sarah Shah will be able to identify and address any mood or anxiety challenges and will maintain a low level of anxiety and depression symptoms as evidenced by self-report.  Updated Short-Term Objectives as of 2025: Objective 1A: Sarah Shah will be able to identify any negative thinking patterns that are perpetuating anxiety or depressive symptoms and replace these with more adaptive thoughts. - GOAL Partially met  Objective 1B: Sarah Shah will engage in behavioral activation strategies to decrease symptoms of depression Objective 1C: Sarah Shah will engage in regular appropriate relaxation and coping activities to try to enhance her overall coping resources Objective 1D: Sarah Shah will be able to appropriately manage periods of depressive symptoms and anxiety without using negative coping strategies such as self-injurious behaviors Objective 1E: Sarah Shah will be able to use appropriate coping strategies including helpful thoughts to maintain a low level of anxiety and depressive symptoms. Interventions: Cognitive Behavioral Therapy, Psychologist, Occupational, Motivational Interviewing, and Psycho-education/Bibliotherapy  coping skills and other evidenced-based practices will be used to promote progress towards healthy functioning and to help manage decrease symptoms associated with their diagnosis.  Treatment Regimen: Individual skill building sessions every 2-4 weeks to address treatment goal/objective Target Date: 05/2024 Responsible Party: therapist and  patient Person delivering treatment: Licensed Psychologist Sarah Dumas, [name] will support the patient's ability to achieve the goals identified. Resolved:  No -as of May 2025, Sarah Shah has continued to make progress with managing her anxiety and depression, but has periodically experienced increases in her symptoms of depression as well as anxiety and inconsistently uses her coping resources when experiencing these increases -therefore, this goal is continued   Problem/Need: Life transition - Carrera is in the process of completing a major life transition including graduating from high school, starting college, and potentially working. - As of 06/02/2022 Niamh has graduated from high school and successfully completed 2 semesters of college. However she continues to have life transitions, so the problem/need section has been updated - As of 05/18/2023 Hollee has continued to work towards completing her life transitions  Updated Problem/Need: Life transition - Teagyn has continuing life transitions including working towards completing her college program and getting a job.  Long-Term Goal #2: Mulki will successfully navigate these life transitions including being able to manage her stress level.  Updated Short-Term Objectives as of 2025: Current stressors associated with life transitions that Crystalina continues to struggle with include time management and expanding her social circle Objective 2A: Caitlan will be able to develop a flexible schedule that includes completing her school work and home tasks Objective 2B: Linnea will identify people or settings where there is the possibility of developing a friendship  Objective 2C: Adyson will begin to consider employment opportunities.  Objective 2D: Gae will complete necessary coursework Interventions: Assertiveness/Communication, Psychologist, Occupational, and Psycho-education/Bibliotherapy , executive functioning skills strategies and other evidenced-based practices  will be used to promote progress towards healthy functioning and to help manage decrease symptoms associated with  their diagnosis.  Treatment Regimen: Individual skill building sessions every 2-4 weeks to address treatment goal/objective Target Date: 05/2024 Responsible Party: therapist and patient Person delivering treatment: Licensed Psychologist Sarah Dumas, [name] will support the patient's ability to achieve the goals identified. Resolved:  No  Problem/Need: Life skills - Aerielle needs to develop some additional skills to facilitate independent living. Long-Term Goal #3: Amarianna will begin to develop additional life skills necessary to facilitate independent living.  Updated Short-Term Objectives as of 2025: areas identified include money management, cooking, and transportation Objective 3A: Sharilynn will begin to study for her drivers test Objective 3B: Shawnita will understand her money spending and saving priorities  Objective 3C: Tiffine will develop and stick to a budget  Interventions: Motivational Interviewing, Solution-Oriented/Positive Psychology, and Psycho-education/Bibliotherapy  daily life skills, and other evidenced-based practices will be used to promote progress towards healthy functioning and to help manage decrease symptoms associated with their diagnosis.  Treatment Regimen: Individual skill building sessions every 2-4 weeks to address treatment goal/objective Target Date: 05/2024 Responsible Party: therapist and patient Person delivering treatment: Licensed Psychologist Sarah Dumas, [name] will support the patient's ability to achieve the goals identified. Resolved:  No - Brookie has made some strides towards identifying areas that need to be addressed to facilitate independent living and made some progress on developing those skills, but process continues    Sarah Dumas, [name]

## 2023-11-20 ENCOUNTER — Encounter (HOSPITAL_BASED_OUTPATIENT_CLINIC_OR_DEPARTMENT_OTHER): Payer: Self-pay | Admitting: Emergency Medicine

## 2023-11-20 ENCOUNTER — Emergency Department (HOSPITAL_BASED_OUTPATIENT_CLINIC_OR_DEPARTMENT_OTHER)
Admission: EM | Admit: 2023-11-20 | Discharge: 2023-11-20 | Disposition: A | Discharge status: Home / Self Care | Attending: Emergency Medicine | Admitting: Emergency Medicine

## 2023-11-20 ENCOUNTER — Other Ambulatory Visit: Payer: Self-pay

## 2023-11-20 DIAGNOSIS — H9202 Otalgia, left ear: Secondary | ICD-10-CM | POA: Insufficient documentation

## 2023-11-20 DIAGNOSIS — H66005 Acute suppurative otitis media without spontaneous rupture of ear drum, recurrent, left ear: Secondary | ICD-10-CM

## 2023-11-20 MED ORDER — AMOXICILLIN 500 MG PO CAPS: 500.0000 mg | ORAL_CAPSULE | Freq: Three times a day (TID) | ORAL | 0 refills | Status: AC

## 2023-11-20 NOTE — ED Triage Notes (Signed)
 Left ear pain.  Tender to touch Fullness  Painful to lay on it

## 2023-11-20 NOTE — ED Provider Notes (Signed)
 [place] EMERGENCY DEPARTMENT AT [facility] Provider Note   CSN: [id number] Arrival date & time: 11/20/23  8197     Patient presents with: Otalgia   Sarah Shah is a 23 y.o. female.  {Add pertinent medical, surgical, social history, OB history to YEP:67052} Patient is here for evaluation of left ear pain that began yesterday.  Patient had watery eyes for the last 2 days.  She believes she had a fever earlier today stating she was very hot but did not check her temperature.  She last took Tylenol  1 hour ago.  She reports ear pain made sleeping difficult last night.  She admits to using Q-tips recently to clear earwax.  She denies any recent cough, congestion, abdominal pain, nausea vomiting diarrhea.  Patient has recurrent ear infections with the changing of the seasons.  He last had an ear infection several months ago.  She denies surgeries to either ear.  She does not have an ENT.  The history is provided by the patient.  Otalgia Location:  Left Quality:  Sharp, pressure and sore Severity:  Moderate Onset quality:  Sudden Duration:  1 day Timing:  Constant Progression:  Worsening Context: not direct blow, not foreign body in ear and not water in ear   Relieved by:  OTC medications, position and palpation Associated symptoms: fever   Associated symptoms: no congestion, no cough, no diarrhea, no headaches, no hearing loss, no rhinorrhea, no sore throat, no tinnitus and no vomiting   Associated symptoms comment:  Eye watering, possible fever Risk factors: chronic ear infection   Risk factors: no prior ear surgery        Prior to Admission medications   Medication Sig Start Date End Date Taking? Authorizing Provider  acetaminophen  (TYLENOL ) 325 MG tablet Take 650 mg by mouth every 6 (six) hours as needed.    [provider]  Amphetamine  Sulfate 10 MG TABS Take 1 tablet by mouth daily as needed. 01/29/23   [provider]   amphetamine -dextroamphetamine  (ADDERALL  XR) 30 MG 24 hr capsule Take 1 capsule (30 mg total) by mouth in the morning. 06/21/23     amphetamine -dextroamphetamine  (ADDERALL  XR) 30 MG 24 hr capsule Take 1 capsule (30 mg total) by mouth in the morning. 07/21/23     busPIRone  (BUSPAR ) 30 MG tablet Take 30 mg by mouth 2 (two) times daily.    [provider]  cloNIDine  (CATAPRES ) 0.2 MG tablet Take 0.2 mg by mouth 2 (two) times daily. 06/23/22   [provider]  cromolyn  (OPTICROM ) 4 % ophthalmic solution SMARTSIG:1 Drop(s) In Eye(s) Twice Daily PRN 06/02/23   [provider]  desogestrel -ethinyl estradiol  (VOLNEA ) 0.15-0.02/0.01 MG (21/5) tablet TAKE 1 TABLET BY MOUTH EVERY DAY. TAKE ACTIVE TABLETS ONLY, SKIP PLACEBO TABLETS. 10/26/23   Corwin Antu, FNP  docusate sodium  (COLACE) 100 MG capsule Take 100 mg by mouth at bedtime.    [provider]  famotidine  (PEPCID ) 20 MG tablet Take 20 mg by mouth daily.    [provider]  ferrous sulfate  325 (65 FE) MG tablet Take 325 mg by mouth at bedtime.    [provider]  metFORMIN  (GLUCOPHAGE -XR) 500 MG 24 hr tablet Take 1 tablet (500 mg total) by mouth daily with breakfast. 11/05/23   Corwin Antu, FNP  omeprazole  (PRILOSEC) 40 MG capsule Take 1 capsule (40 mg total) by mouth daily. 10/25/23   Abran Norleen SAILOR, MD  RESTASIS  0.05 % ophthalmic emulsion 1 drop 2 (two) times daily.  06/02/23   [provider]  venlafaxine  XR (EFFEXOR -XR) 150 MG 24 hr capsule Take 150 mg by mouth daily. 07/15/22   [provider]  venlafaxine  XR (EFFEXOR -XR) 75 MG 24 hr capsule Take 75 mg by mouth daily. 09/11/22   [provider]  vitamin C  (ASCORBIC ACID ) 500 MG tablet Take 500 mg by mouth at bedtime.    [provider]    Allergies: Wound dressing adhesive, Phentermine, and Semaglutide     Review of Systems  Constitutional:  Positive for fever.  HENT:  Positive for ear pain. Negative for  congestion, hearing loss, rhinorrhea, sore throat and tinnitus.   Respiratory:  Negative for cough.   Gastrointestinal:  Negative for diarrhea and vomiting.  Neurological:  Negative for headaches.    Updated Vital Signs BP 106/83   Pulse 95   Temp 98.2 F (36.8 C) (Oral)   Resp 16   SpO2 99%   Physical Exam  (all labs ordered are listed, but only abnormal results are displayed) Labs Reviewed - No data to display  EKG: None  Radiology: No results found.  {Document cardiac monitor, telemetry assessment procedure when appropriate:32947} Procedures   Medications Ordered in the ED - No data to display    {Click here for ABCD2, HEART and other calculators REFRESH Note before signing:1}                              Medical Decision Making  ***  {Document critical care time when appropriate  Document review of labs and clinical decision tools ie CHADS2VASC2, etc  Document your independent review of radiology images and any outside records  Document your discussion with family members, caretakers and with consultants  Document social determinants of health affecting pt's care  Document your decision making why or why not admission, treatments were needed:32947:::1}   Final diagnoses:  None    ED Discharge Orders     None

## 2023-11-20 NOTE — Discharge Instructions (Addendum)
 It was a pleasure meeting with you today.  I have sent in antibiotic to your pharmacy.  I would recommend to follow-up with your primary care physician for further evaluation of resolution of symptoms.  I would consider establishing with an ENT specialist due to recurrent ear infections.

## 2023-11-22 ENCOUNTER — Telehealth: Payer: Self-pay

## 2023-11-22 LAB — GENECONNECT MOLECULAR SCREEN: Genetic Analysis Overall Interpretation: NEGATIVE

## 2023-11-22 NOTE — Telephone Encounter (Signed)
 Sarah Shah

## 2023-11-22 NOTE — Telephone Encounter (Signed)
 Per chart review tab pt seen [facility] ED on 11/20/23. Sending note  to ONEIDA Patrick FNP and [facility] pool.,.

## 2023-11-29 ENCOUNTER — Ambulatory Visit: Admitting: Clinical

## 2023-11-29 ENCOUNTER — Ambulatory Visit: Payer: Self-pay | Admitting: Family

## 2023-11-29 DIAGNOSIS — F840 Autistic disorder: Secondary | ICD-10-CM

## 2023-12-02 NOTE — Telephone Encounter (Signed)
 Please see the MyChart message reply(ies) for my assessment and plan.  The patient gave consent for this Medical Advice Message and is aware that it may result in a bill to their insurance company as well as the possibility that this may result in a co-payment or deductible. They are an established patient, but are not seeking medical advice exclusively about a problem treated during an in person or video visit in the last 7 days. I did not recommend an in person or video visit within 7 days of my reply.  I spent a total of 3 minutes cumulative time within 7 days through [employer] Ginger Patrick, FNP

## 2023-12-12 NOTE — Progress Notes (Unsigned)
 Ellouise Console, PA-C [address] [place], [place]  [address] Phone: [phone]   Primary Care Physician: Corwin Antu, FNP  Primary Gastroenterologist:  Ellouise Console, PA-C / Norleen Kiang, MD   Chief Complaint: Follow-up chronic GERD       HPI:   Discussed the use of AI scribe software for clinical note transcription with the patient, who gave verbal consent to proceed.  23 year old female returns for annual follow-up of chronic GERD.  Currently on omeprazole  40 mg every morning with great benefit.  Needs medication refill.  History of Present Illness   PMH: Autism spectrum disorder, morbid obesity, developmental delay, PCOS, GERD, ODD, ADHD, cholelithiasis s/p cholecystectomy 03/2022..  02/2021 EGD by Dr. Kiang: Normal.  Biopsies negative for celiac.  02/2021 colonoscopy by Dr. Kiang: Normal.  Repeat age 49.  Current Outpatient Medications  Medication Sig Dispense Refill   acetaminophen  (TYLENOL ) 325 MG tablet Take 650 mg by mouth every 6 (six) hours as needed.     Amphetamine  Sulfate 10 MG TABS Take 1 tablet by mouth daily as needed.     amphetamine -dextroamphetamine  (ADDERALL  XR) 30 MG 24 hr capsule Take 1 capsule (30 mg total) by mouth in the morning. 30 capsule 0   amphetamine -dextroamphetamine  (ADDERALL  XR) 30 MG 24 hr capsule Take 1 capsule (30 mg total) by mouth in the morning. 30 capsule 0   busPIRone  (BUSPAR ) 30 MG tablet Take 30 mg by mouth 2 (two) times daily.     cloNIDine  (CATAPRES ) 0.2 MG tablet Take 0.2 mg by mouth 2 (two) times daily.     cromolyn  (OPTICROM ) 4 % ophthalmic solution SMARTSIG:1 Drop(s) In Eye(s) Twice Daily PRN     desogestrel -ethinyl estradiol  (VOLNEA ) 0.15-0.02/0.01 MG (21/5) tablet TAKE 1 TABLET BY MOUTH EVERY DAY. TAKE ACTIVE TABLETS ONLY, SKIP PLACEBO TABLETS. 90 tablet 0   docusate sodium  (COLACE) 100 MG capsule Take 100 mg by mouth at bedtime.     famotidine  (PEPCID ) 20 MG tablet Take 20 mg by mouth daily.     ferrous sulfate  325  (65 FE) MG tablet Take 325 mg by mouth at bedtime.     metFORMIN  (GLUCOPHAGE -XR) 500 MG 24 hr tablet Take 1 tablet (500 mg total) by mouth daily with breakfast. 90 tablet 0   omeprazole  (PRILOSEC) 40 MG capsule Take 1 capsule (40 mg total) by mouth daily. 90 capsule 1   RESTASIS  0.05 % ophthalmic emulsion 1 drop 2 (two) times daily.     venlafaxine  XR (EFFEXOR -XR) 150 MG 24 hr capsule Take 150 mg by mouth daily.     venlafaxine  XR (EFFEXOR -XR) 75 MG 24 hr capsule Take 75 mg by mouth daily.     vitamin C  (ASCORBIC ACID ) 500 MG tablet Take 500 mg by mouth at bedtime.     No current facility-administered medications for this visit.    Allergies as of 12/13/2023 - Review Complete 11/20/2023  Allergen Reaction Noted   Wound dressing adhesive Itching 06/11/2022   Phentermine Other (See Comments) 11/05/2023   Semaglutide  Nausea And Vomiting 01/20/2023    Past Medical History:  Diagnosis Date   ADHD (attention deficit hyperactivity disorder)    Anemia    Anxiety    Autism spectrum    Constipated    Constipation    Depression    Development delay    GERD (gastroesophageal reflux disease)    Headache    Insulin resistance    Iron deficiency    Learning disability    Obesity  ODD (oppositional defiant disorder)    PCOS (polycystic ovarian syndrome)    PMDD (premenstrual dysphoric disorder)    Pneumonia    3 mos old and 11 mos old   Visual acuity reduced    glasses    Past Surgical History:  Procedure Laterality Date   BIOPSY  02/17/2021   Procedure: BIOPSY;  Surgeon: Abran Norleen SAILOR, MD;  Location: [facility];  Service: Endoscopy;;   CHOLECYSTECTOMY  09/17/2021   COLONOSCOPY WITH PROPOFOL  N/A 02/17/2021   Procedure: COLONOSCOPY WITH PROPOFOL ;  Surgeon: Abran Norleen SAILOR, MD;  Location: [facility];  Service: Endoscopy;  Laterality: N/A;   ESOPHAGOGASTRODUODENOSCOPY (EGD) WITH PROPOFOL  N/A 02/17/2021   Procedure: ESOPHAGOGASTRODUODENOSCOPY (EGD) WITH PROPOFOL ;  Surgeon: Abran Norleen SAILOR, MD;  Location: [facility];  Service: Endoscopy;  Laterality: N/A;   TOOTH EXTRACTION N/A 01/03/2018   Procedure: SURGICAL EXTRACTION OF TEETH #1, 16, 17, 32;  Surgeon: Joanette Soulier, DMD;  Location: [facility] OR;  Service: Oral Surgery;  Laterality: N/A;    Review of Systems:    All systems reviewed and negative except where noted in HPI.    Physical Exam:  There were no vitals taken for this visit. No LMP recorded. (Menstrual status: Oral contraceptives).  General: Well-nourished, well-developed in no acute distress.  Lungs: Clear to auscultation bilaterally. Non-labored. Heart: Regular rate and rhythm, no murmurs rubs or gallops.  Abdomen: Bowel sounds are normal; Abdomen is Soft; No hepatosplenomegaly, masses or hernias;  No Abdominal Tenderness; No guarding or rebound tenderness. Neuro: Alert and oriented x 3.  Grossly intact.  Psych: Alert and cooperative, normal mood and affect.   Imaging Studies: No results found.  Labs: CBC    Component Value Date/Time   WBC 8.4 10/02/2023 1241   RBC 4.84 10/02/2023 1241   HGB 13.9 10/02/2023 1241   HGB 14.2 02/23/2023 0859   HGB 14.0 12/16/2020 1703   HCT 41.0 10/02/2023 1241   HCT 39.8 12/16/2020 1703   PLT 247 10/02/2023 1241   PLT 278 02/23/2023 0859   PLT 372 12/16/2020 1703   MCV 84.7 10/02/2023 1241   MCV 85 12/16/2020 1703   MCH 28.7 10/02/2023 1241   MCHC 33.9 10/02/2023 1241   RDW 12.4 10/02/2023 1241   RDW 13.8 12/16/2020 1703   LYMPHSABS 2.2 10/02/2023 1241   LYMPHSABS 0.9 12/16/2020 1703   MONOABS 1.0 10/02/2023 1241   EOSABS 0.1 10/02/2023 1241   EOSABS 0.0 12/16/2020 1703   BASOSABS 0.1 10/02/2023 1241   BASOSABS 0.0 12/16/2020 1703    CMP     Component Value Date/Time   NA 139 10/02/2023 1241   NA 140 04/16/2021 0748   K 3.5 10/02/2023 1241   CL 107 10/02/2023 1241   CO2 22 10/02/2023 1241   GLUCOSE 90 10/02/2023 1241   BUN 5 (L) 10/02/2023 1241   BUN 8 04/16/2021 0748   CREATININE 0.63  10/02/2023 1241   CREATININE 0.68 02/23/2023 0859   CREATININE 0.66 09/12/2018 0000   CALCIUM 9.3 10/02/2023 1241   PROT 7.0 10/01/2023 0115   PROT 7.1 04/16/2021 0748   ALBUMIN 3.2 (L) 10/01/2023 0115   ALBUMIN 4.2 04/16/2021 0748   AST 155 (H) 10/01/2023 0115   AST 12 (L) 02/23/2023 0859   ALT 100 (H) 10/01/2023 0115   ALT 17 02/23/2023 0859   ALKPHOS 115 10/01/2023 0115   BILITOT 0.9 10/01/2023 0115   BILITOT 0.3 02/23/2023 0859   GFRNONAA >60 10/02/2023 1241   GFRNONAA >60 02/23/2023 9140  GFRAA CANCELED 07/18/2019 9060       Assessment and Plan:   Sherell Christoffel is a 23 y.o. y/o female presents for annual follow-up of:  1.  Chronic GERD: Controlled on PPI - Refill omeprazole  40 mg once daily, #90, 3 refills - Recommend Lifestyle Modifications to prevent Acid Reflux.  Rec. Avoid coffee, sodas, peppermint, garlic, onions, alcohol, citrus fruits, chocolate, tomatoes, fatty and spicey foods.  Avoid eating 2-3 hours before bedtime.    Assessment and Plan Assessment & Plan       Ellouise Console, PA-C  Follow up ***

## 2023-12-13 ENCOUNTER — Ambulatory Visit: Admitting: Physician Assistant

## 2023-12-13 ENCOUNTER — Encounter: Payer: Self-pay | Admitting: Physician Assistant

## 2023-12-13 ENCOUNTER — Telehealth: Payer: Self-pay | Admitting: Physician Assistant

## 2023-12-13 VITALS — BP 126/78 | HR 83 | Ht 65.0 in | Wt 324.2 lb

## 2023-12-13 DIAGNOSIS — R7401 Elevation of levels of liver transaminase levels: Secondary | ICD-10-CM | POA: Diagnosis not present

## 2023-12-13 DIAGNOSIS — K219 Gastro-esophageal reflux disease without esophagitis: Secondary | ICD-10-CM

## 2023-12-13 MED ORDER — OMEPRAZOLE 40 MG PO CPDR: 40.0000 mg | DELAYED_RELEASE_CAPSULE | Freq: Every day | ORAL | 3 refills | Status: AC

## 2023-12-13 NOTE — Patient Instructions (Addendum)
 We have sent the following medications to your pharmacy for you to pick up at your convenience: Omeprazole  40 mg once daily  Please follow up sooner if symptoms increase or worsen  Due to recent changes in healthcare laws, you may see the results of your imaging and laboratory studies on MyChart before your provider has had a chance to review them.  We understand that in some cases there may be results that are confusing or concerning to you. Not all laboratory results come back in the same time frame and the provider may be waiting for multiple results in order to interpret others.  Please give us  48 hours in order for your provider to thoroughly review all the results before contacting the office for clarification of your results.   Thank you for trusting me with your gastrointestinal care!   Ellouise Console, PA-C _______________________________________________________  If your blood pressure at your visit was 140/90 or greater, please contact your primary care physician to follow up on this.  _______________________________________________________  If you are age 41 or older, your body mass index should be between 23-30. Your Body mass index is 53.96 kg/m. If this is out of the aforementioned range listed, please consider follow up with your Primary Care Provider.  If you are age 80 or younger, your body mass index should be between 19-25. Your Body mass index is 53.96 kg/m. If this is out of the aformentioned range listed, please consider follow up with your Primary Care Provider.   ________________________________________________________  The [facility] providers would like to encourage you to use MYCHART to communicate with providers for non-urgent requests or questions.  Due to long hold times on the telephone, sending your provider a message by [facility] may be a faster and more efficient way to get a response.  Please allow 48 business hours for a response.  Please remember that this is  for non-urgent requests.  _______________________________________________________

## 2023-12-13 NOTE — Telephone Encounter (Signed)
 Schedule lab visit: Hepatic panel.

## 2023-12-14 NOTE — Progress Notes (Signed)
 Reviewed. She certainly is at risk for fatty liver disease

## 2023-12-15 ENCOUNTER — Ambulatory Visit: Admitting: Clinical

## 2023-12-15 ENCOUNTER — Other Ambulatory Visit

## 2023-12-15 DIAGNOSIS — F840 Autistic disorder: Secondary | ICD-10-CM | POA: Diagnosis not present

## 2023-12-15 DIAGNOSIS — F32A Depression, unspecified: Secondary | ICD-10-CM | POA: Diagnosis not present

## 2023-12-15 DIAGNOSIS — R7401 Elevation of levels of liver transaminase levels: Secondary | ICD-10-CM

## 2023-12-15 DIAGNOSIS — F419 Anxiety disorder, unspecified: Secondary | ICD-10-CM | POA: Diagnosis not present

## 2023-12-15 LAB — HEPATIC FUNCTION PANEL
ALT: 10 U/L (ref 0–35)
AST: 9 U/L (ref 0–37)
Albumin: 4.2 g/dL (ref 3.5–5.2)
Alkaline Phosphatase: 85 U/L (ref 39–117)
Bilirubin, Direct: 0 mg/dL (ref 0.0–0.3)
Total Bilirubin: 0.2 mg/dL (ref 0.2–1.2)
Total Protein: 7.8 g/dL (ref 6.0–8.3)

## 2023-12-15 NOTE — Telephone Encounter (Signed)
 I spoke with the patients mother and she stated that they will come by after her behavioral health appointment to have the labs drawn.

## 2023-12-16 ENCOUNTER — Ambulatory Visit: Payer: Self-pay | Admitting: Physician Assistant

## 2023-12-16 ENCOUNTER — Ambulatory Visit: Admitting: Clinical

## 2023-12-16 NOTE — Progress Notes (Unsigned)
 [facility] Counselor/Therapist Progress Note  Patient ID: Sarah Shah, MRN: [id number]    Date: 12/15/2023  Time Spent: 3:04 pm - 3:47 pm: 43 Minutes  Type of Service Provided Individual Therapy  Type of Contact in-person Location: office   Mental Status Exam: Appearance:  Casual     Behavior: Appropriate  Motor: Normal  Speech/Language:  Clear and Coherent and Normal Rate  Affect: Appropriate  Mood: normal  Thought process: normal  Thought content:   WNL  Sensory/Perceptual disturbances:   WNL  Orientation: oriented to person, situation, and day of week  Attention: Fair  Concentration: Fair  Memory: WNL  Fund of knowledge:  Fair  Insight:   Fair  Judgment:  Fair  Impulse Control: Fair   Risk Assessment:  Danger to Self:  No - denied SI Self-injurious Behavior: No - denied SIB Danger to Others: No Duty to Warn:no  Presenting Problems, Reported Symptoms, and /or Interim History: Sarah Shah presented for a session to address mood and anxiety challenges.   Subjective: Sarah Shah presented for an individual outpatient therapy session. The following was addressed during sessions.   Sarah Shah reported that her mood was okay, but she has been experiencing some moodiness when anxious. Her anxiety has been up-and-down. She felt badly after getting chocolate at the mall. She became overwhelmed when in a busy shopping area which resulted in some negative behaviors. Strategies to manage overwhelm better in the future was discussed. She had another upset at home and broke her remote, but indicated that she did this instead of a more negative behaviors like SIB. Alternative strategies to manage this were discussed. 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 Her class has been going well and she followed up with some of the suggestions from last time and talked to her advisor about next semester and was able plan courses for  next semester that do not feel too overwhelming.     Interventions/Psychotherapy Techniques Used During Session: Cognitive Behavioral Therapy and solution focused strategies  Diagnosis: Depression, unspecified depression type  Autism spectrum disorder  Anxiety   MENTAL HEALTH INTERVENTIONS USED DURING TREATMENT & PATIENT'S RESPONSE TO INTERVENTIONS:  Short-term Objective addressed today:Sarah Shah will be able to appropriately manage periods of depressive symptoms and anxiety without using negative coping strategies such as self-injurious behaviors. Mental health techniques used: Objective was addressed in session through the use of Cognitive Behavioral Therapy and solution focused strategies, and discussion. Sarah Shah's response was positive. Progress Toward Goal: some progress    PLAN  1. Sarah Shah will return for a therapy session.   2. Homework Given:  use anxiety management strategies, prepare of busy, loud environments to decease the changes of overwhelm. This homework will be reviewed with Sarah Shah at the next visit.  3. During the next session check in on mood, anxiety, and long term life plans.     Sarah Shah  Individual Treatment Plan  - please see the notes from 04/17/2021 & 05/01/2021 for the initial treatment plan, the updates in Winter 2024, and the goal review from 06/02/2022, and another goal review from 05/18/2023 for complete treatment plan information.    Updated goals as of May 2025 Problem/Need: Sarah Shah has a history of anxiety and mood concerns - with some ongoing challenges in these areas  Long-Term Goal #1: Sarah Shah will be able to identify and address any mood or anxiety challenges and will maintain a low level of anxiety and depression symptoms  as evidenced by self-report.  Updated Short-Term Objectives as of 2025: Objective 1A: Sarah Shah will be able to identify any negative thinking patterns that are perpetuating anxiety or depressive symptoms and replace these with more adaptive  thoughts. - GOAL Partially met  Objective 1B: Sarah Shah will engage in behavioral activation strategies to decrease symptoms of depression Objective 1C: Sarah Shah will engage in regular appropriate relaxation and coping activities to try to enhance her overall coping resources Objective 1D: Sarah Shah will be able to appropriately manage periods of depressive symptoms and anxiety without using negative coping strategies such as self-injurious behaviors Objective 1E: Sarah Shah will be able to use appropriate coping strategies including helpful thoughts to maintain a low level of anxiety and depressive symptoms. Interventions: Cognitive Behavioral Therapy, Psychologist, Occupational, Motivational Interviewing, and Psycho-education/Bibliotherapy  coping skills and other evidenced-based practices will be used to promote progress towards healthy functioning and to help manage decrease symptoms associated with their diagnosis.  Treatment Regimen: Individual skill building sessions every 2-4 weeks to address treatment goal/objective Target Date: 05/2024 Responsible Party: therapist and patient Person delivering treatment: Licensed Psychologist Sarah Shah will support the patient's ability to achieve the goals identified. Resolved:  No -as of May 2025, Sarah Shah has continued to make progress with managing her anxiety and depression, but has periodically experienced increases in her symptoms of depression as well as anxiety and inconsistently uses her coping resources when experiencing these increases -therefore, this goal is continued   Problem/Need: Life transition - Sarah Shah is in the process of completing a major life transition including graduating from high school, starting college, and potentially working. - As of 06/02/2022 Sarah Shah has graduated from high school and successfully completed 2 semesters of college. However she continues to have life transitions, so the problem/need section has been updated - As of 05/18/2023 Sarah Shah  has continued to work towards completing her life transitions  Updated Problem/Need: Life transition - Sarah Shah has continuing life transitions including working towards completing her college program and getting a job.  Long-Term Goal #2: Sarah Shah will successfully navigate these life transitions including being able to manage her stress level.  Updated Short-Term Objectives as of 2025: Current stressors associated with life transitions that Sarah Shah continues to struggle with include time management and expanding her social circle Objective 2A: Sarah Shah will be able to develop a flexible schedule that includes completing her school work and home tasks Objective 2B: Sarah Shah will identify people or settings where there is the possibility of developing a friendship  Objective 2C: Sarah Shah will begin to consider employment opportunities.  Objective 2D: Sarah Shah will complete necessary coursework Interventions: Assertiveness/Communication, Psychologist, Occupational, and Psycho-education/Bibliotherapy , executive functioning skills strategies and other evidenced-based practices will be used to promote progress towards healthy functioning and to help manage decrease symptoms associated with their diagnosis.  Treatment Regimen: Individual skill building sessions every 2-4 weeks to address treatment goal/objective Target Date: 05/2024 Responsible Party: therapist and patient Person delivering treatment: Licensed Psychologist Sarah Shah will support the patient's ability to achieve the goals identified. Resolved:  No  Problem/Need: Life skills - Elany needs to develop some additional skills to facilitate independent living. Long-Term Goal #3: Manie will begin to develop additional life skills necessary to facilitate independent living.  Updated Short-Term Objectives as of 2025: areas identified include money management, cooking, and transportation Objective 3A: Tysheena will begin to study for her drivers test Objective  3B: Memorie will understand her money spending and saving priorities  Objective 3C: Bettyjane will develop  and stick to a budget  Interventions: Motivational Interviewing, Solution-Oriented/Positive Psychology, and Psycho-education/Bibliotherapy  daily life skills, and other evidenced-based practices will be used to promote progress towards healthy functioning and to help manage decrease symptoms associated with their diagnosis.  Treatment Regimen: Individual skill building sessions every 2-4 weeks to address treatment goal/objective Target Date: 05/2024 Responsible Party: therapist and patient Person delivering treatment: Licensed Psychologist Sarah Shah will support the patient's ability to achieve the goals identified. Resolved:  No - Marjan has made some strides towards identifying areas that need to be addressed to facilitate independent living and made some progress on developing those skills, but process continues     Sarah Shah

## 2023-12-24 ENCOUNTER — Encounter: Payer: Self-pay | Admitting: "Endocrinology

## 2023-12-24 ENCOUNTER — Ambulatory Visit: Admitting: "Endocrinology

## 2023-12-24 VITALS — BP 112/80 | HR 102 | Ht 65.0 in | Wt 329.0 lb

## 2023-12-24 DIAGNOSIS — Z6841 Body Mass Index (BMI) 40.0 and over, adult: Secondary | ICD-10-CM | POA: Diagnosis not present

## 2023-12-24 DIAGNOSIS — E8881 Metabolic syndrome: Secondary | ICD-10-CM | POA: Diagnosis not present

## 2023-12-24 DIAGNOSIS — Z8639 Personal history of other endocrine, nutritional and metabolic disease: Secondary | ICD-10-CM | POA: Diagnosis not present

## 2023-12-24 DIAGNOSIS — E282 Polycystic ovarian syndrome: Secondary | ICD-10-CM | POA: Diagnosis not present

## 2023-12-24 DIAGNOSIS — E66813 Obesity, class 3: Secondary | ICD-10-CM | POA: Diagnosis not present

## 2023-12-24 NOTE — Patient Instructions (Signed)
 On Metformin  XR 500mg  every day Increase to 1 tablet with breakfast and one with dinner. If you do not have any upset stomach, gradually increase to the goal dose of 2 capsules with breakfast and 2 capsules with dinner. If you do have upset stomach, decrease it back to the previous dose that you tolerated.  Week 1: one tablet after dinner Week 2: one tablet after breakfast and one after dinner  Week 3: one tablet after breakfast and two after dinner  Week 4 and onwards: two tablets after breakfast and two after dinner
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
## 2023-12-27 ENCOUNTER — Other Ambulatory Visit: Payer: Self-pay | Admitting: *Deleted

## 2023-12-27 DIAGNOSIS — Z3041 Encounter for surveillance of contraceptive pills: Secondary | ICD-10-CM

## 2023-12-27 MED ORDER — DESOGESTREL-ETHINYL ESTRADIOL 0.15-0.02/0.01 MG (21/5) PO TABS: ORAL_TABLET | ORAL | 0 refills | Status: DC

## 2023-12-28 NOTE — Telephone Encounter (Signed)
 Ellouise, I am forwarding you this message as you saw patient 12/13/2023

## 2023-12-29 ENCOUNTER — Ambulatory Visit: Admitting: Clinical

## 2023-12-29 ENCOUNTER — Other Ambulatory Visit: Payer: Self-pay | Admitting: *Deleted

## 2023-12-29 DIAGNOSIS — F3341 Major depressive disorder, recurrent, in partial remission: Secondary | ICD-10-CM

## 2023-12-29 DIAGNOSIS — F840 Autistic disorder: Secondary | ICD-10-CM

## 2023-12-29 DIAGNOSIS — Z3041 Encounter for surveillance of contraceptive pills: Secondary | ICD-10-CM

## 2023-12-29 MED ORDER — DESOGESTREL-ETHINYL ESTRADIOL 0.15-0.02/0.01 MG (21/5) PO TABS: ORAL_TABLET | ORAL | 1 refills | Status: AC

## 2023-12-29 NOTE — Progress Notes (Signed)
 [facility] Counselor/Therapist Progress Note  Patient ID: Sarah Shah, MRN: [id number]    Date: 12/29/2023  Time Spent: 3:06 pm - 3:49 pm: 43 Minutes  Type of Service Provided Individual Therapy  Type of Contact in-person Location: office   Mental Status Exam: Appearance:  Casual     Behavior: Sharing  Motor: Normal  Speech/Language:  Clear and Coherent and Normal Rate  Affect: Appropriate  Mood: normal  Thought process: normal  Thought content:   WNL  Sensory/Perceptual disturbances:   WNL  Orientation: oriented to person, place, time/date, and situation  Attention: Fair  Concentration: Fair  Memory: WNL  Fund of knowledge:  Fair  Insight:   Fair  Judgment:  Fair  Impulse Control: Fair   Risk Assessment:  Danger to Self:  No - denied SI Self-injurious Behavior: No - denied SIB Danger to Others: No Duty to Warn:no  - Chace was informed about therapists upcoming out of office time  Presenting Problems, Reported Symptoms, and /or Interim History: Ruthellen presented for a session to address mood challenges.   Subjective: Dealva presented for an individual outpatient therapy session. The following was addressed during sessions.   Zephyra reported that things have been okay. She indicated that she has been feeling in a fog more than depressed lately, but has had some down moods and periods of depression (e.g., her mood has been lower when the weather is worse). She denied SI and SIB. She has met with some doctors and is in the process of moving forward with gastric bypass surgery. She was looking forward to the surgery, but was not sure when it was going to happen. Although Cloie indicated that she was hoping that it would be before she went back to school, therapist discussed that it may take longer and encouraged Ammi to think about what she can do to help herself during the wait. Thresea discussed some automatic negative thoughts that she has been having about  her eating, but also identified some positive eating behaviors that she has engaged in. The impact of these negative thoughts as well as how she can change them was discussed. Some structural changes to help her make better food choices was also discussed.  At the end of the visit, Kashia reported that she passed her class and got her certificate.     Interventions/Psychotherapy Techniques Used During Session: Cognitive Behavioral Therapy, solution focused and executive functioning strategies  Diagnosis: Recurrent major depressive disorder, in partial remission  Autism spectrum disorder  MENTAL HEALTH INTERVENTIONS USED DURING TREATMENT & PATIENT'S RESPONSE TO INTERVENTIONS:  Short-term Objective addressed today:Jenisha will be able to identify any negative thinking patterns that are perpetuating anxiety or depressive symptoms and replace these with more adaptive thoughts. Mental health techniques used: Objective was addressed in session through the use of  Cognitive Behavioral Therapy and discussion. Brilee's response was positive.  Progress Toward Goal: progressing  Short-term Objective addressed today:Moorea will complete necessary coursework. Mental health techniques used: Objective was addressed in session through the use of solution focused and executive functioning strategies, and discussion. Khristine's response was positive. Progress Toward Goal: progressing    PLAN  1. Mirielle will return for a therapy session.   2. Homework Given:  try to combat some of her negative self-talk, continue to engage in coping strategies. This homework will be reviewed with [name] at the next visit.  3. During the next session check in on mood, anxiety, and potential surgery.     [name],  [name]  Individual Treatment Plan  - please see the notes from 04/17/2021 & 05/01/2021 for the initial treatment plan, the updates in Winter 2024, and the goal review from 06/02/2022, and another goal review from 05/18/2023 for  complete treatment plan information.    Updated goals as of May 2025 Problem/Need: Pearla has a history of anxiety and mood concerns - with some ongoing challenges in these areas  Long-Term Goal #1: Valynn will be able to identify and address any mood or anxiety challenges and will maintain a low level of anxiety and depression symptoms as evidenced by self-report.  Updated Short-Term Objectives as of 2025: Objective 1A: Mykael will be able to identify any negative thinking patterns that are perpetuating anxiety or depressive symptoms and replace these with more adaptive thoughts. - GOAL Partially met  Objective 1B: Keller will engage in behavioral activation strategies to decrease symptoms of depression Objective 1C: Laniyah will engage in regular appropriate relaxation and coping activities to try to enhance her overall coping resources Objective 1D: Parisha will be able to appropriately manage periods of depressive symptoms and anxiety without using negative coping strategies such as self-injurious behaviors Objective 1E: Sadie will be able to use appropriate coping strategies including helpful thoughts to maintain a low level of anxiety and depressive symptoms. Interventions: Cognitive Behavioral Therapy, Psychologist, Occupational, Motivational Interviewing, and Psycho-education/Bibliotherapy  coping skills and other evidenced-based practices will be used to promote progress towards healthy functioning and to help manage decrease symptoms associated with their diagnosis.  Treatment Regimen: Individual skill building sessions every 2-4 weeks to address treatment goal/objective Target Date: 05/2024 Responsible Party: therapist and patient Person delivering treatment: Licensed Psychologist [name], [name] will support the patient's ability to achieve the goals identified. Resolved:  No -as of May 2025, Seth has continued to make progress with managing her anxiety and depression, but has periodically  experienced increases in her symptoms of depression as well as anxiety and inconsistently uses her coping resources when experiencing these increases -therefore, this goal is continued   Problem/Need: Life transition - Berdina is in the process of completing a major life transition including graduating from high school, starting college, and potentially working. - As of 06/02/2022 Micah has graduated from high school and successfully completed 2 semesters of college. However she continues to have life transitions, so the problem/need section has been updated - As of 05/18/2023 Makailee has continued to work towards completing her life transitions  Updated Problem/Need: Life transition - Marny has continuing life transitions including working towards completing her college program and getting a job.  Long-Term Goal #2: Julliette will successfully navigate these life transitions including being able to manage her stress level.  Updated Short-Term Objectives as of 2025: Current stressors associated with life transitions that Hollynn continues to struggle with include time management and expanding her social circle Objective 2A: Virginie will be able to develop a flexible schedule that includes completing her school work and home tasks Objective 2B: Lenia will identify people or settings where there is the possibility of developing a friendship  Objective 2C: Trenyce will begin to consider employment opportunities.  Objective 2D: Khala will complete necessary coursework Interventions: Assertiveness/Communication, Psychologist, Occupational, and Psycho-education/Bibliotherapy , executive functioning skills strategies and other evidenced-based practices will be used to promote progress towards healthy functioning and to help manage decrease symptoms associated with their diagnosis.  Treatment Regimen: Individual skill building sessions every 2-4 weeks to address treatment goal/objective Target Date: 05/2024 Responsible Party:  therapist and  patient Person delivering treatment: Licensed Psychologist [name], [name] will support the patient's ability to achieve the goals identified. Resolved:  No  Problem/Need: Life skills - Nancye needs to develop some additional skills to facilitate independent living. Long-Term Goal #3: Juanisha will begin to develop additional life skills necessary to facilitate independent living.  Updated Short-Term Objectives as of 2025: areas identified include money management, cooking, and transportation Objective 3A: Evee will begin to study for her drivers test Objective 3B: Wonda will understand her money spending and saving priorities  Objective 3C: Opie will develop and stick to a budget  Interventions: Motivational Interviewing, Solution-Oriented/Positive Psychology, and Psycho-education/Bibliotherapy  daily life skills, and other evidenced-based practices will be used to promote progress towards healthy functioning and to help manage decrease symptoms associated with their diagnosis.  Treatment Regimen: Individual skill building sessions every 2-4 weeks to address treatment goal/objective Target Date: 05/2024 Responsible Party: therapist and patient Person delivering treatment: Licensed Psychologist [name], [name] will support the patient's ability to achieve the goals identified. Resolved:  No - Jeiry has made some strides towards identifying areas that need to be addressed to facilitate independent living and made some progress on developing those skills, but process continues   [name], [name]

## 2024-01-03 ENCOUNTER — Ambulatory Visit: Admitting: Sleep Medicine

## 2024-01-04 NOTE — Telephone Encounter (Signed)
 She'll need a visit for this form

## 2024-01-20 ENCOUNTER — Encounter: Payer: Self-pay | Admitting: Family

## 2024-01-20 ENCOUNTER — Ambulatory Visit: Admitting: Clinical

## 2024-01-20 ENCOUNTER — Ambulatory Visit: Admitting: Family

## 2024-01-20 VITALS — BP 102/76 | HR 88 | Temp 98.7°F | Ht 65.0 in | Wt 332.8 lb

## 2024-01-20 DIAGNOSIS — G43809 Other migraine, not intractable, without status migrainosus: Secondary | ICD-10-CM | POA: Diagnosis not present

## 2024-01-20 DIAGNOSIS — F341 Dysthymic disorder: Secondary | ICD-10-CM | POA: Diagnosis not present

## 2024-01-20 DIAGNOSIS — F332 Major depressive disorder, recurrent severe without psychotic features: Secondary | ICD-10-CM

## 2024-01-20 DIAGNOSIS — E282 Polycystic ovarian syndrome: Secondary | ICD-10-CM | POA: Diagnosis not present

## 2024-01-20 DIAGNOSIS — R6250 Unspecified lack of expected normal physiological development in childhood: Secondary | ICD-10-CM

## 2024-01-20 DIAGNOSIS — E782 Mixed hyperlipidemia: Secondary | ICD-10-CM | POA: Diagnosis not present

## 2024-01-20 DIAGNOSIS — E66813 Obesity, class 3: Secondary | ICD-10-CM | POA: Diagnosis not present

## 2024-01-20 DIAGNOSIS — F419 Anxiety disorder, unspecified: Secondary | ICD-10-CM | POA: Diagnosis not present

## 2024-01-20 DIAGNOSIS — Z6841 Body Mass Index (BMI) 40.0 and over, adult: Secondary | ICD-10-CM | POA: Diagnosis not present

## 2024-01-20 DIAGNOSIS — F840 Autistic disorder: Secondary | ICD-10-CM

## 2024-01-20 DIAGNOSIS — E8881 Metabolic syndrome: Secondary | ICD-10-CM

## 2024-01-20 DIAGNOSIS — F411 Generalized anxiety disorder: Secondary | ICD-10-CM | POA: Diagnosis not present

## 2024-01-20 DIAGNOSIS — F913 Oppositional defiant disorder: Secondary | ICD-10-CM

## 2024-01-20 DIAGNOSIS — F902 Attention-deficit hyperactivity disorder, combined type: Secondary | ICD-10-CM | POA: Diagnosis not present

## 2024-01-20 DIAGNOSIS — F3341 Major depressive disorder, recurrent, in partial remission: Secondary | ICD-10-CM | POA: Diagnosis not present

## 2024-01-20 NOTE — Progress Notes (Signed)
 [facility] Counselor/Therapist Progress Note  Patient ID: Sarah Shah, MRN: [id number]    Date: 01/20/2024  Time Spent: 1:28 pm - 2:18 pm: 50 Minutes  Type of Service Provided Individual Therapy  Type of Contact virtual (via [employer] with real time audio and visual interaction)  Patient Location: car  - in [place], [place]     Provider Location: office  Sarah Shah participated from car, via video, and consented to treatment. Therapist participated from office.  Patient consented to telehealth therapy and is aware and consented to the limitations of telehealth.   Mental Status Exam: Appearance:  Casual     Behavior: Sharing  Motor: Normal  Speech/Language:  Clear and Coherent and Normal Rate  Affect: Seemed a bit down  Mood: Normal for client but seemed a bit down or subdued   Thought process: normal  Thought content:   WNL  Sensory/Perceptual disturbances:   WNL  Orientation: oriented to person, place, time/date, and situation  Attention: Good  Concentration: Good  Memory: WNL  Fund of knowledge:  Fair  Insight:   Fair  Judgment:  Fair  Impulse Control: Fair   Risk Assessment:  Danger to Self:  No - denied SI Self-injurious Behavior: No - denied SIB Danger to Others: No Duty to Warn:no  Presenting Problems, Reported Symptoms, and /or Interim History: Sarah Shah presented for a session to address mood concerns.   Subjective: Sarah Shah presented for an individual outpatient therapy session. The following was addressed during sessions.   Sarah Shah reported that things have been going so-so for her. She described her mood as in the middle but also indicated that she has been experiencing some depression. She denied SI and SIB. Her anxiety has been low to medium. She has been experiencing some frustration and irritations. Her down mood has been related to her Sarah Shah moving out and some challenges with her Sarah Shah. Ways to better manage her feelings around her Sarah Shah  were discussed. She is going to start school again next week and has decided to pursue bariatric surgery. Strategies that she could use to better manage her depression were discussed.       Interventions/Psychotherapy Techniques Used During Session: Cognitive Behavioral Therapy  Diagnosis: Recurrent major depressive disorder, in partial remission  Autism spectrum disorder  Anxiety  MENTAL HEALTH INTERVENTIONS USED DURING TREATMENT & PATIENT'S RESPONSE TO INTERVENTIONS:  Short-term Objective addressed today:Sarah Shah will engage in behavioral activation strategies to decrease symptoms of depression AND Sarah Shah will engage in regular appropriate relaxation and coping activities to try to enhance her overall coping resources AND Sarah Shah will be able to appropriately manage periods of depressive symptoms and anxiety without using negative coping strategies such as self-injurious behaviors. Mental health techniques used: Objective was addressed in session through the use of Cognitive Behavioral Therapy, and discussion [name]'s response was generally positive. Progress Toward Goal: some progress     PLAN  1. Sarah Shah will return for a therapy session.   2. Homework Given:  use discussed strategies to try to manage down feelings. This homework will be reviewed with Sarah at the next visit.  3. During the next session check in on mood, anxiety, and the start to school.     Sarah Dumas, [name]  Individual Treatment Plan  - please see the notes from 04/17/2021 & 05/01/2021 for the initial treatment plan, the updates in Winter 2024, and the goal review from 06/02/2022, and another goal review from 05/18/2023 for complete treatment plan information.    Updated  goals as of May 2025 Problem/Need: [name] has a history of anxiety and mood concerns - with some ongoing challenges in these areas  Long-Term Goal #1: Sarah Shah will be able to identify and address any mood or anxiety challenges and will maintain a low level of anxiety  and depression symptoms as evidenced by self-report.  Updated Short-Term Objectives as of 2025: Objective 1A: Sarah Shah will be able to identify any negative thinking patterns that are perpetuating anxiety or depressive symptoms and replace these with more adaptive thoughts. - GOAL Partially met  Objective 1B: Sarah Shah will engage in behavioral activation strategies to decrease symptoms of depression Objective 1C: Sarah Shah will engage in regular appropriate relaxation and coping activities to try to enhance her overall coping resources Objective 1D: Sarah Shah will be able to appropriately manage periods of depressive symptoms and anxiety without using negative coping strategies such as self-injurious behaviors Objective 1E: Sarah Shah will be able to use appropriate coping strategies including helpful thoughts to maintain a low level of anxiety and depressive symptoms. Interventions: Cognitive Behavioral Therapy, Psychologist, Occupational, Motivational Interviewing, and Psycho-education/Bibliotherapy  coping skills and other evidenced-based practices will be used to promote progress towards healthy functioning and to help manage decrease symptoms associated with their diagnosis.  Treatment Regimen: Individual skill building sessions every 2-4 weeks to address treatment goal/objective Target Date: 05/2024 Responsible Party: therapist and patient Person delivering treatment: Licensed Psychologist Sarah Dumas, [name] will support the patient's ability to achieve the goals identified. Resolved:  No -as of May 2025, Sarah Shah has continued to make progress with managing her anxiety and depression, but has periodically experienced increases in her symptoms of depression as well as anxiety and inconsistently uses her coping resources when experiencing these increases -therefore, this goal is continued   Problem/Need: Life transition - Sarah Shah is in the process of completing a major life transition including graduating from high school,  starting college, and potentially working. - As of 06/02/2022 Sarah Shah has graduated from high school and successfully completed 2 semesters of college. However she continues to have life transitions, so the problem/need section has been updated - As of 05/18/2023 Amayra has continued to work towards completing her life transitions  Updated Problem/Need: Life transition - Samiya has continuing life transitions including working towards completing her college program and getting a job.  Long-Term Goal #2: Janani will successfully navigate these life transitions including being able to manage her stress level.  Updated Short-Term Objectives as of 2025: Current stressors associated with life transitions that Liesel continues to struggle with include time management and expanding her social circle Objective 2A: Sherrian will be able to develop a flexible schedule that includes completing her school work and home tasks Objective 2B: Eleen will identify people or settings where there is the possibility of developing a friendship  Objective 2C: Citlali will begin to consider employment opportunities.  Objective 2D: Mozella will complete necessary coursework Interventions: Assertiveness/Communication, Psychologist, Occupational, and Psycho-education/Bibliotherapy , executive functioning skills strategies and other evidenced-based practices will be used to promote progress towards healthy functioning and to help manage decrease symptoms associated with their diagnosis.  Treatment Regimen: Individual skill building sessions every 2-4 weeks to address treatment goal/objective Target Date: 05/2024 Responsible Party: therapist and patient Person delivering treatment: Licensed Psychologist Sarah Dumas, [name] will support the patient's ability to achieve the goals identified. Resolved:  No  Problem/Need: Life skills - Anamarie needs to develop some additional skills to facilitate independent living. Long-Term Goal #3: Korine will begin  to develop  additional life skills necessary to facilitate independent living.  Updated Short-Term Objectives as of 2025: areas identified include money management, cooking, and transportation Objective 3A: Lavette will begin to study for her drivers test Objective 3B: Kaidyn will understand her money spending and saving priorities  Objective 3C: Esmay will develop and stick to a budget  Interventions: Motivational Interviewing, Solution-Oriented/Positive Psychology, and Psycho-education/Bibliotherapy  daily life skills, and other evidenced-based practices will be used to promote progress towards healthy functioning and to help manage decrease symptoms associated with their diagnosis.  Treatment Regimen: Individual skill building sessions every 2-4 weeks to address treatment goal/objective Target Date: 05/2024 Responsible Party: therapist and patient Person delivering treatment: Licensed Psychologist Sarah Dumas, [name] will support the patient's ability to achieve the goals identified. Resolved:  No - Alya has made some strides towards identifying areas that need to be addressed to facilitate independent living and made some progress on developing those skills, but process continues   Sarah Dumas, [name]

## 2024-01-20 NOTE — Progress Notes (Signed)
 "  Established Patient Office Visit  Subjective:      CC:  Chief Complaint  Patient presents with   Medical Management of Chronic Issues    Needs paperwork filled our for bariatric surgery.    HPI: Sarah Shah is a 24 y.o. female presenting on 01/20/2024 for Medical Management of Chronic Issues (Needs paperwork filled our for bariatric surgery.) .  Discussed the use of AI scribe software for clinical note transcription with the patient, who gave verbal consent to proceed.  History of Present Illness Sarah Shah is a 24 year old female with PCOS who presents for evaluation of weight management and consideration for bariatric surgery.  She has experienced significant weight gain since puberty, which she attributes to hormonal changes associated with her PCOS. Her PCOS diagnosis followed symptoms of delayed menarche, excessive body hair growth, and skin discoloration. She also has insulin resistance, low estrogen, and high testosterone  levels, complicating her weight management efforts.  She has attempted various weight loss strategies, including diet and exercise, with limited success. She has been on and off Mounjaro  due to insurance issues and has tried other medications such as Qsymia, topiramate , and phentermine without success. Currently, she is on naltrexone 50 mg and bupropion XL 150 mg once daily, and recently started metformin  extended release 500 mg which she is working upwards with her endocrinologist. She follows regularly with an endocrinologist last seen 12/12/5 and she was recently advised to increase her metformin  XR 500 mg to 2 tablets twice daily. She is also on birth control for PCOS management.   Her physical activity is intermittent, primarily involving outdoor activities with her dogs and brisk walking. Seasonal depression affects her motivation, disrupting her exercise routine. She has access to home exercise equipment, including a Bowflex, elliptical,  kettlebell, and hand weights, but transportation and motivation remain barriers to consistent exercise.She states she has not pursued strength training as her sister cancelled their gym membership but she is going to try to start working out from home with some strength training.  She has been meal prepping diligently, focusing on balanced meals with protein, such as salmon, and has reduced her intake of unhealthy foods. She has stopped consuming Fairlife chocolate milk and now makes fresh eggs from her chickens every morning. Diet modifications: has worked with healthy weight and wellness which she started in February 2021  She has a history of major depressive disorder, generalized anxiety disorder, dysthymic disorder, and ADHD. She is currently on venlafaxine  225 mg once daily, clonidine , buspirone , and ADHD medications including Adderall  and amphetamine . She sees a psychiatrist for medication management and a therapist every three to four weeks, with plans to increase the frequency during colder months when her depression worsens.      Social history:  Relevant past medical, surgical, family and social history reviewed and updated as indicated. Interim medical history since our last visit reviewed.  Allergies and medications reviewed and updated.  DATA REVIEWED: CHART IN EPIC     ROS: Negative unless specifically indicated above in HPI.   Current Medications[1]        Objective:        BP 102/76 (BP Location: Right Arm, Patient Position: Sitting, Cuff Size: Large)   Pulse 88   Temp 98.7 F (37.1 C) (Temporal)   Ht 5' 5 (1.651 m)   Wt (!) 332 lb 12.8 oz (151 kg)   SpO2 98%   BMI 55.38 kg/m   Physical Exam MEASUREMENTS: Weight- 332, BMI-  55.38.  Wt Readings from Last 3 Encounters:  01/20/24 (!) 332 lb 12.8 oz (151 kg)  12/24/23 (!) 329 lb (149.2 kg)  12/13/23 (!) 324 lb 4 oz (147.1 kg)    Physical Exam Vitals reviewed.  Constitutional:      General: She is  not in acute distress.    Appearance: Normal appearance. She is obese. She is not ill-appearing, toxic-appearing or diaphoretic.  HENT:     Head: Normocephalic.  Cardiovascular:     Rate and Rhythm: Normal rate.  Pulmonary:     Effort: Pulmonary effort is normal.  Musculoskeletal:        General: Normal range of motion.  Neurological:     General: No focal deficit present.     Mental Status: She is alert and oriented to person, place, and time. Mental status is at baseline.  Psychiatric:        Mood and Affect: Mood normal.        Behavior: Behavior normal.        Thought Content: Thought content normal.        Judgment: Judgment normal.          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 Weight gain since puberty with unsuccessful weight loss attempts through diet and exercise. Previous trials of Mounjaro , Zepbound , Wegovy , Qsymia, topiramate , and phentermine were unsuccessful due to insurance issues or lack of efficacy. Current medications include naltrexone and bupropion as part of a Contrave regimen. Recent addition of metformin  extended release. Weight loss surgery considered due to failure of other methods and risk of life-threatening conditions if weight is not controlled. - Faxed letter of medical necessity and endorsement for weight loss surgery. - Encouraged incorporation of 30-60 minutes of brisk walking or equivalent exercise daily. - Continue current medications including naltrexone, bupropion, and metformin . - Encouraged meal prepping and healthy eating habits.  Polycystic ovarian syndrome Diagnosed with PCOS, insulin resistance, low estrogen, and high testosterone .  Symptoms include delayed start of periods, excessive body hair growth, and dark discoloration on skin. Currently on birth control for PCOS management. - Continue birth control for PCOS management.  Major depressive disorder Seasonal depression affecting motivation and exercise adherence. Currently on venlafaxine  XR 225 mg daily. Recently started on naltrexone and bupropion as part of a Contrave regimen. Therapy sessions every 3-4 weeks, with consideration to increase frequency during winter months. - Continue venlafaxine  XR 225 mg daily. - Continue therapy sessions, consider increasing frequency during winter months. - Encouraged use of transportation resources for therapy sessions.  Attention-deficit hyperactivity disorder ADHD managed with amphetamine -dextroamphetamine  (Adderall  XR) and amphetamine  sulfate. Medications help with impulsivity and task completion, especially in the afternoon. - Continue amphetamine -dextroamphetamine  (Adderall  XR) and amphetamine  sulfate as prescribed.  Obstructive sleep apnea Obstructive sleep apnea.  I feel this patient would benefit from weight loss surgery because she has been unsuccessful losing weight with other diet methods, and her medical conditions will become life threatening if she does not get her weight under control.        Return in about 6 months (around 07/19/2024) for f/u CPE.     Ginger Patrick, MSN, APRN, FNP-C [facility]        [1]  Current Outpatient Medications:    acetaminophen  (TYLENOL ) 325 MG tablet, Take 650 mg by mouth every 6 (six) hours as needed., Disp: , Rfl:    Amphetamine  Sulfate 10 MG TABS, Take 1 tablet by mouth daily as needed., Disp: , Rfl:    amphetamine -dextroamphetamine  (ADDERALL  XR) 30 MG 24 hr capsule, Take 1 capsule (30 mg total) by mouth in the morning., Disp: 30 capsule, Rfl: 0   buPROPion (WELLBUTRIN XL) 150 MG 24 hr tablet, Take 150 mg by mouth every morning., Disp: , Rfl:     busPIRone  (BUSPAR ) 30 MG tablet, Take 30 mg by mouth 2 (two) times daily., Disp: , Rfl:    cloNIDine  (CATAPRES ) 0.2 MG tablet, Take 0.2 mg by mouth 2 (two) times daily., Disp: , Rfl:    cromolyn  (OPTICROM ) 4 % ophthalmic solution, SMARTSIG:1 Drop(s) In Eye(s) Twice Daily PRN, Disp: , Rfl:    desogestrel -ethinyl estradiol  (VOLNEA ) 0.15-0.02/0.01 MG (21/5) tablet, TAKE 1 TABLET BY MOUTH EVERY DAY. TAKE ACTIVE TABLETS ONLY, SKIP PLACEBO TABLETS., Disp: 90 tablet, Rfl: 1   docusate sodium  (COLACE) 100 MG capsule, Take 100 mg by mouth at bedtime., Disp: , Rfl:    famotidine  (PEPCID ) 20 MG tablet, Take 20 mg by mouth daily., Disp: , Rfl:    ferrous sulfate  325 (65 FE) MG tablet, Take 325 mg by mouth at bedtime., Disp: , Rfl:    metFORMIN  (GLUCOPHAGE -XR) 500 MG 24 hr tablet, Take 1 tablet (500 mg total) by mouth daily with breakfast., Disp: 90 tablet, Rfl: 0   naltrexone (DEPADE) 50 MG tablet, Take 50 mg by mouth every morning., Disp: , Rfl:    omeprazole  (PRILOSEC) 40 MG capsule, Take 1 capsule (40 mg total) by mouth daily., Disp: 90 capsule, Rfl: 3   RESTASIS  0.05 % ophthalmic emulsion, 1 drop 2 (two) times daily., Disp: , Rfl:    venlafaxine  XR (EFFEXOR -XR) 150 MG 24 hr capsule, Take 150 mg by mouth daily., Disp: , Rfl:    venlafaxine  XR (EFFEXOR -XR) 75 MG 24 hr capsule, Take 75 mg by mouth daily., Disp: , Rfl:    vitamin C  (ASCORBIC ACID ) 500 MG tablet, Take 500 mg by mouth at bedtime., Disp: , Rfl:   "

## 2024-01-21 ENCOUNTER — Telehealth: Payer: Self-pay | Admitting: Family

## 2024-01-21 NOTE — Telephone Encounter (Signed)
 Please print out letter of medical necessity that I filled out and sent to pt and also print office note and send along to the bariatric surgeon please

## 2024-01-21 NOTE — Telephone Encounter (Signed)
 Letter and last OV note have been faxed to [facility].

## 2024-01-27 ENCOUNTER — Ambulatory Visit: Attending: Sleep Medicine

## 2024-01-27 DIAGNOSIS — G4733 Obstructive sleep apnea (adult) (pediatric): Secondary | ICD-10-CM | POA: Insufficient documentation

## 2024-01-31 ENCOUNTER — Ambulatory Visit: Admitting: "Endocrinology

## 2024-02-03 ENCOUNTER — Ambulatory Visit: Admitting: Clinical

## 2024-02-04 DIAGNOSIS — G4733 Obstructive sleep apnea (adult) (pediatric): Secondary | ICD-10-CM

## 2024-02-08 ENCOUNTER — Encounter: Payer: Self-pay | Admitting: Sleep Medicine

## 2024-02-09 ENCOUNTER — Encounter: Payer: Self-pay | Admitting: Hematology and Oncology

## 2024-02-09 ENCOUNTER — Ambulatory Visit (INDEPENDENT_AMBULATORY_CARE_PROVIDER_SITE_OTHER): Payer: Self-pay | Admitting: Sleep Medicine

## 2024-02-10 ENCOUNTER — Encounter: Payer: Self-pay | Admitting: Sleep Medicine

## 2024-02-10 ENCOUNTER — Ambulatory Visit: Admitting: Clinical

## 2024-02-10 ENCOUNTER — Ambulatory Visit: Admitting: Sleep Medicine

## 2024-02-10 VITALS — BP 124/82 | HR 111 | Temp 98.2°F | Ht 65.0 in | Wt 333.2 lb

## 2024-02-10 DIAGNOSIS — Z6841 Body Mass Index (BMI) 40.0 and over, adult: Secondary | ICD-10-CM | POA: Diagnosis not present

## 2024-02-10 DIAGNOSIS — F840 Autistic disorder: Secondary | ICD-10-CM

## 2024-02-10 DIAGNOSIS — G4733 Obstructive sleep apnea (adult) (pediatric): Secondary | ICD-10-CM | POA: Diagnosis not present

## 2024-02-10 DIAGNOSIS — F419 Anxiety disorder, unspecified: Secondary | ICD-10-CM | POA: Diagnosis not present

## 2024-02-10 DIAGNOSIS — F32A Depression, unspecified: Secondary | ICD-10-CM

## 2024-02-10 NOTE — Progress Notes (Signed)
 "      Name:Sarah Shah MRN: [id number] DOB: 04/29/2000   CHIEF COMPLAINT:  PSG F/U   HISTORY OF PRESENT ILLNESS: Sarah Shah is a 24 y.o. w/ a h/o autism, OCD, PCOS, developmental delay and morbid obesity who presents to follow up on PSG results. The patient underwent PSG which revealed mild OSA (AHI 5, O2 nadir 85%).    EPWORTH SLEEP SCORE 3    09/30/2023    8:47 AM  Results of the Epworth flowsheet  Sitting and reading 0  Watching TV 0  Sitting, inactive in a public place (e.g. a theatre or a meeting) 0  As a passenger in a car for an hour without a break 1  Lying down to rest in the afternoon when circumstances permit 2  Sitting and talking to someone 0  Sitting quietly after a lunch without alcohol 0  In a car, while stopped for a few minutes in traffic 0  Total score 3    PAST MEDICAL HISTORY :   has a past medical history of ADHD (attention deficit hyperactivity disorder), Anemia, Anxiety, Autism spectrum, Constipated, Constipation, Depression, Development delay, GERD (gastroesophageal reflux disease), Headache, Insulin resistance, Iron deficiency, Learning disability, Obesity, ODD (oppositional defiant disorder), PCOS (polycystic ovarian syndrome), PMDD (premenstrual dysphoric disorder), Pneumonia, and Visual acuity reduced.  has a past surgical history that includes Tooth Extraction (N/A, 01/03/2018); Colonoscopy with propofol  (N/A, 02/17/2021); Esophagogastroduodenoscopy (egd) with propofol  (N/A, 02/17/2021); biopsy (02/17/2021); and Cholecystectomy (09/17/2021). Prior to Admission medications   Medication Sig Start Date End Date Taking? Authorizing Provider  acetaminophen  (TYLENOL ) 325 MG tablet Take 650 mg by mouth every 6 (six) hours as needed.   Yes [provider]  Amphetamine  Sulfate 10 MG TABS Take 1 tablet by mouth daily as needed. 01/29/23  Yes [provider]  amphetamine -dextroamphetamine  (ADDERALL  XR) 30 MG 24 hr capsule Take 1 capsule  (30 mg total) by mouth in the morning. 06/21/23  Yes   amphetamine -dextroamphetamine  (ADDERALL  XR) 30 MG 24 hr capsule Take 1 capsule (30 mg total) by mouth in the morning. 07/21/23  Yes   busPIRone  (BUSPAR ) 30 MG tablet Take 30 mg by mouth 2 (two) times daily.   Yes [provider]  cloNIDine  (CATAPRES ) 0.2 MG tablet Take 0.2 mg by mouth 2 (two) times daily. 06/23/22  Yes [provider]  cromolyn  (OPTICROM ) 4 % ophthalmic solution SMARTSIG:1 Drop(s) In Eye(s) Twice Daily PRN 06/02/23  Yes [provider]  desogestrel -ethinyl estradiol  (VOLNEA ) 0.15-0.02/0.01 MG (21/5) tablet TAKE 1 TABLET BY MOUTH EVERY DAY. TAKE ACTIVE TABLETS ONLY, SKIP PLACEBO TABLETS. 01/17/23  Yes Dugal, Tabitha, FNP  docusate sodium  (COLACE) 100 MG capsule Take 100 mg by mouth at bedtime.   Yes [provider]  famotidine  (PEPCID ) 20 MG tablet Take 20 mg by mouth daily.   Yes [provider]  ferrous sulfate  325 (65 FE) MG tablet Take 325 mg by mouth at bedtime.   Yes [provider]  omeprazole  (PRILOSEC) 40 MG capsule Take 1 capsule (40 mg total) by mouth daily. 07/15/22  Yes Nandigam, [name] V, MD  RESTASIS  0.05 % ophthalmic emulsion 1 drop 2 (two) times daily. 06/02/23  Yes [provider]  tirzepatide  (ZEPBOUND ) 7.5 MG/0.5ML Pen Inject 7.5 mg into the skin once a week. 09/23/23  Yes Dugal, Tabitha, FNP  venlafaxine  XR (EFFEXOR -XR) 150 MG 24 hr capsule Take 150 mg by mouth daily. 07/15/22  Yes [provider]  venlafaxine  XR (EFFEXOR -XR) 75 MG 24 hr  capsule Take 75 mg by mouth daily. 09/11/22  Yes [provider]  vitamin C  (ASCORBIC ACID ) 500 MG tablet Take 500 mg by mouth at bedtime.   Yes [provider]   Allergies  Allergen Reactions   Wound Dressing Adhesive Itching    Itchy and irritated.   Phentermine Other (See Comments)    Agitation    Semaglutide  Nausea And Vomiting    FAMILY HISTORY:  family history includes ADD / ADHD in her  sister; Anxiety disorder in her father, mother, and sister; Atrial fibrillation in her maternal grandmother; Breast cancer in her maternal aunt; Colon polyps in her mother; Depression in her father; Diabetes in her father, maternal grandmother, and mother; Heart failure in her maternal grandmother; Hypertension in her maternal grandfather and mother; Iron deficiency in her maternal aunt; Irritable bowel syndrome in her mother; Obesity in her father and mother; Other in her mother and sister; Stroke in her mother. SOCIAL HISTORY:  reports that she has never smoked. She has been exposed to tobacco smoke. She has never used smokeless tobacco. She reports that she does not drink alcohol and does not use drugs.   Review of Systems:  Gen:  Denies  fever, sweats, chills weight loss  HEENT: Denies blurred vision, double vision, ear pain, eye pain, hearing loss, nose bleeds, sore throat Cardiac:  No dizziness, chest pain or heaviness, chest tightness,edema, No JVD Resp:   No cough, -sputum production, -shortness of breath,-wheezing, -hemoptysis,  Gi: Denies swallowing difficulty, stomach pain, nausea or vomiting, diarrhea, constipation, bowel incontinence Gu:  Denies bladder incontinence, burning urine Ext:   Denies Joint pain, stiffness or swelling Skin: Denies  skin rash, easy bruising or bleeding or hives Endoc:  Denies polyuria, polydipsia , polyphagia or weight change Psych:   Denies depression, insomnia or hallucinations  Other:  All other systems negative  VITAL SIGNS: BP 124/82   Pulse (!) 111   Temp 98.2 F (36.8 C)   Ht 5' 5 (1.651 m)   Wt (!) 333 lb 3.2 oz (151.1 kg)   SpO2 98%   BMI 55.45 kg/m     Physical Examination:   General Appearance: No distress  EYES PERRLA, EOM intact.   NECK Supple, No JVD Pulmonary: normal breath sounds, No wheezing.  CardiovascularNormal S1,S2.  No m/r/g.   Abdomen: Benign, Soft, non-tender. Skin:   warm, no rashes, no ecchymosis  Extremities:  normal, no cyanosis, clubbing. Neuro:without focal findings,  speech normal  PSYCHIATRIC: Mood, affect within normal limits.   ASSESSMENT AND PLAN  OSA Reviewed PSG results with patient. Starting on APAP therapy set to 4-14 cm H2O. Discussed the consequences of untreated sleep apnea. Advised not to drive drowsy for safety of patient and others. Will follow u pin 3 months.    Morbid obesity Counseled patient on diet and lifestyle modification.    Patient  satisfied with Plan of action and management. All questions answered  I spent a total of 29 minutes reviewing chart data, face-to-face evaluation with the patient, counseling and coordination of care as detailed above.    Priyansh Pry, M.D.  Sleep Medicine [facility]        "

## 2024-02-10 NOTE — Patient Instructions (Addendum)
" °  Patient Instructions Need to be using your CPAP every night, minimum of 7-8 hours a night.  Change equipment as directed. Wash your tubing with warm soap and water  weekly, hang to dry. Wash humidifier portion weekly. Use bottled, distilled water  and change daily Be aware of reduced alertness and do not drive or operate heavy machinery if experiencing this or drowsiness.  Exercise encouraged, as tolerated. Healthy weight management discussed.  Avoid or decrease alcohol consumption and medications that make you more sleepy, if possible. Notify if persistent daytime sleepiness occurs even with consistent use of PAP therapy.   Change CPAP supplies... Every month Mask cushions and/or nasal pillows CPAP machine filters Every 3 months Mask frame (not including the headgear) CPAP tubing Every 6 months Mask headgear Chin strap (if applicable) Humidifier water  tub   Be aware of reduced alertness and do not drive or operate heavy machinery if experiencing this or drowsiness.  Exercise encouraged, as tolerated. Encouraged proper weight management.  Important to get eight or more hours of sleep  Limiting the use of the computer and television before bedtime.  Decrease naps during the day, so night time sleep will become enhanced.  Limit caffeine, and sleep deprivation.  HTN, stroke, uncontrolled diabetes and heart failure are potential risk factors.  Risk of untreated sleep apnea including cardiac arrhthymias, stroke, DM, pulm HTN.           "

## 2024-02-10 NOTE — Progress Notes (Signed)
 [facility] Counselor/Therapist Progress Note  Patient ID: Sarah Shah, MRN: [id number]    Date: 02/10/24  Time Spent: 12:21 pm - 1:06 pm: 45 Minutes  Type of Service Provided Individual Therapy  Type of Contact virtual (via Care[employer] with real time audio and visual interaction)  Patient Location: home       Provider Location: office  [name] Jenkins Holts participated from home, via video, and consented to treatment. Therapist participated from office. Patient consented to telehealth therapy and is aware and consented to the limitations of telehealth.   Mental Status Exam: Appearance:  Casual     Behavior: Sharing  Motor: Normal  Speech/Language:  Clear and Coherent and Normal Rate  Affect: Tearful at times  Mood: sad  Thought process: normal  Thought content:   WNL  Sensory/Perceptual disturbances:   WNL  Orientation: oriented to person, place, time/date, and situation  Attention: Good  Concentration: Good  Memory: WNL  Fund of knowledge:  Fair  Insight:   Fair  Judgment:  Fair  Impulse Control: Fair   Risk Assessment:  Danger to Self:  No - denied SI Self-injurious Behavior: No - denied SIB Danger to Others: No Duty to Warn:no  *Of note, based on what is happening in Dharma's life right now and the potential for an increase in symptoms, resources (e.g., behavioral health urgent care) were reviewed and Haelie committed to talking to a family member and seeking out necessary resources if things change.   Presenting Problems, Reported Symptoms, and /or Interim History: [name] presented for a session to address grief and anxiety.   Subjective: Damara presented for an individual outpatient therapy session. The following was addressed during sessions.   Patty reported that her mood is in between. Two of her guinea pigs have illness that are not able to be cured and will be put down today. Shaday expressed some grief about the loss of her pets and intermittently  became tearful. Processing grief and allowing herself to feel these negative emotions was dicussed, along with trying to prevent this incident from pulling her back into a major depressive episode. Strategies that she could use to help regulate her mood were discussed.   Shelia also expressed anxiety about her classes. Strategies that she could use to help address these concerns were discussed. Anxiety seems to be leading her to feel that she cannot start assignments and ways to try to work around this was discussed. Although SI and SIB were denied, Adelene has been engaging in some skin picking. Strategies to try to address this behavior were also discussed. She is taking the next steps towards her weight loss surgery  Interventions/Psychotherapy Techniques Used During Session: Cognitive Behavioral Therapy, grief support, executive functioning strategies  Diagnosis: Depression, unspecified depression type  Autism spectrum disorder  Anxiety  MENTAL HEALTH INTERVENTIONS USED DURING TREATMENT & PATIENT'S RESPONSE TO INTERVENTIONS:  Short-term Objective addressed today:Deshondra will engage in behavioral activation strategies to decrease symptoms of depression AND Manie will engage in regular appropriate relaxation and coping activities to try to enhance her overall coping resources AND Shalawn will be able to appropriately manage periods of depressive symptoms and anxiety without using negative coping strategies such as self-injurious behaviors Mental health techniques used: Objective was addressed in session through the use of Cognitive Behavioral Therapy and grief support and discussion. Symphany's response was mostly positive.  Progress Toward Goal: some progress  Short-term Objective addressed today:Cassandr will be able to develop a flexible schedule that includes completing her  school work and home tasks Mental health techniques used: Objective was addressed in session through the use of executive functioning  strategies and discussion. Bryonna's response was mostly positive. Progress Toward Goal: progressing    PLAN  1. Konni will return for a therapy session.   2. Homework Given:  give herself space to process her grief, engage in activities that help her to feel positive emotions, turn something in for her essay, seek out the writing center before her next essay is due. This homework will be reviewed with [name] at the next visit.  3. During the next session check in on mood, anxiety, and school.     [name], [name]   Individual Treatment Plan  - please see the notes from 04/17/2021 & 05/01/2021 for the initial treatment plan, the updates in Winter 2024, and the goal review from 06/02/2022, and another goal review from 05/18/2023 for complete treatment plan information.    Updated goals as of May 2025 Problem/Need: Toyoko has a history of anxiety and mood concerns - with some ongoing challenges in these areas  Long-Term Goal #1: Chanele will be able to identify and address any mood or anxiety challenges and will maintain a low level of anxiety and depression symptoms as evidenced by self-report.  Updated Short-Term Objectives as of 2025: Objective 1A: Lovenia will be able to identify any negative thinking patterns that are perpetuating anxiety or depressive symptoms and replace these with more adaptive thoughts. - GOAL Partially met  Objective 1B: Sihaam will engage in behavioral activation strategies to decrease symptoms of depression Objective 1C: Bevan will engage in regular appropriate relaxation and coping activities to try to enhance her overall coping resources Objective 1D: Caralina will be able to appropriately manage periods of depressive symptoms and anxiety without using negative coping strategies such as self-injurious behaviors Objective 1E: Arline will be able to use appropriate coping strategies including helpful thoughts to maintain a low level of anxiety and depressive symptoms. Interventions:  Cognitive Behavioral Therapy, Psychologist, Occupational, Motivational Interviewing, and Psycho-education/Bibliotherapy  coping skills and other evidenced-based practices will be used to promote progress towards healthy functioning and to help manage decrease symptoms associated with their diagnosis.  Treatment Regimen: Individual skill building sessions every 2-4 weeks to address treatment goal/objective Target Date: 05/2024 Responsible Party: therapist and patient Person delivering treatment: Licensed Psychologist [name], [name] will support the patient's ability to achieve the goals identified. Resolved:  No -as of May 2025, Kerina has continued to make progress with managing her anxiety and depression, but has periodically experienced increases in her symptoms of depression as well as anxiety and inconsistently uses her coping resources when experiencing these increases -therefore, this goal is continued   Problem/Need: Life transition - Shallyn is in the process of completing a major life transition including graduating from high school, starting college, and potentially working. - As of 06/02/2022 Maylynn has graduated from high school and successfully completed 2 semesters of college. However she continues to have life transitions, so the problem/need section has been updated - As of 05/18/2023 Charyl has continued to work towards completing her life transitions  Updated Problem/Need: Life transition - Narelle has continuing life transitions including working towards completing her college program and getting a job.  Long-Term Goal #2: Vinie will successfully navigate these life transitions including being able to manage her stress level.  Updated Short-Term Objectives as of 2025: Current stressors associated with life transitions that Kristell continues to struggle with include time management and  expanding her social circle Objective 2A: Haeven will be able to develop a flexible schedule that includes  completing her school work and home tasks Objective 2B: Briunna will identify people or settings where there is the possibility of developing a friendship  Objective 2C: Brookelyn will begin to consider employment opportunities.  Objective 2D: Eustolia will complete necessary coursework Interventions: Assertiveness/Communication, Psychologist, Occupational, and Psycho-education/Bibliotherapy , executive functioning skills strategies and other evidenced-based practices will be used to promote progress towards healthy functioning and to help manage decrease symptoms associated with their diagnosis.  Treatment Regimen: Individual skill building sessions every 2-4 weeks to address treatment goal/objective Target Date: 05/2024 Responsible Party: therapist and patient Person delivering treatment: Licensed Psychologist [name], [name] will support the patient's ability to achieve the goals identified. Resolved:  No  Problem/Need: Life skills - Terease needs to develop some additional skills to facilitate independent living. Long-Term Goal #3: Colin will begin to develop additional life skills necessary to facilitate independent living.  Updated Short-Term Objectives as of 2025: areas identified include money management, cooking, and transportation Objective 3A: Joclyn will begin to study for her drivers test Objective 3B: Ziaire will understand her money spending and saving priorities  Objective 3C: Syenna will develop and stick to a budget  Interventions: Motivational Interviewing, Solution-Oriented/Positive Psychology, and Psycho-education/Bibliotherapy  daily life skills, and other evidenced-based practices will be used to promote progress towards healthy functioning and to help manage decrease symptoms associated with their diagnosis.  Treatment Regimen: Individual skill building sessions every 2-4 weeks to address treatment goal/objective Target Date: 05/2024 Responsible Party: therapist and patient Person  delivering treatment: Licensed Psychologist [name], [name] will support the patient's ability to achieve the goals identified. Resolved:  No - Ryan has made some strides towards identifying areas that need to be addressed to facilitate independent living and made some progress on developing those skills, but process continues   [name], [name]

## 2024-02-17 ENCOUNTER — Other Ambulatory Visit (HOSPITAL_COMMUNITY): Payer: Self-pay | Admitting: Surgery

## 2024-02-24 ENCOUNTER — Ambulatory Visit: Admitting: Clinical

## 2024-03-07 ENCOUNTER — Encounter: Admitting: Skilled Nursing Facility1

## 2024-03-28 ENCOUNTER — Ambulatory Visit: Admitting: "Endocrinology

## 2024-05-11 ENCOUNTER — Ambulatory Visit: Admitting: Sleep Medicine
